# Patient Record
Sex: Female | Born: 1970 | Race: Black or African American | Hispanic: No | Marital: Single | Smoking: Current every day smoker
Health system: Southern US, Community
[De-identification: ages and names within clinical notes are randomized; demographics above are authoritative.]

## PROBLEM LIST (undated history)

## (undated) DIAGNOSIS — Z9289 Personal history of other medical treatment: Secondary | ICD-10-CM

## (undated) DIAGNOSIS — E669 Obesity, unspecified: Secondary | ICD-10-CM

## (undated) DIAGNOSIS — K5792 Diverticulitis of intestine, part unspecified, without perforation or abscess without bleeding: Secondary | ICD-10-CM

## (undated) DIAGNOSIS — R519 Headache, unspecified: Secondary | ICD-10-CM

## (undated) DIAGNOSIS — J302 Other seasonal allergic rhinitis: Secondary | ICD-10-CM

## (undated) DIAGNOSIS — T7840XA Allergy, unspecified, initial encounter: Secondary | ICD-10-CM

## (undated) DIAGNOSIS — B999 Unspecified infectious disease: Secondary | ICD-10-CM

## (undated) DIAGNOSIS — D649 Anemia, unspecified: Secondary | ICD-10-CM

## (undated) DIAGNOSIS — N83209 Unspecified ovarian cyst, unspecified side: Secondary | ICD-10-CM

## (undated) DIAGNOSIS — J189 Pneumonia, unspecified organism: Secondary | ICD-10-CM

## (undated) DIAGNOSIS — I10 Essential (primary) hypertension: Secondary | ICD-10-CM

## (undated) DIAGNOSIS — T148XXA Other injury of unspecified body region, initial encounter: Secondary | ICD-10-CM

## (undated) DIAGNOSIS — R51 Headache: Secondary | ICD-10-CM

## (undated) HISTORY — PX: FOREHEAD RECONSTRUCTION: SHX429

## (undated) HISTORY — PX: TUBAL LIGATION: SHX77

## (undated) HISTORY — DX: Allergy, unspecified, initial encounter: T78.40XA

## (undated) HISTORY — PX: WISDOM TOOTH EXTRACTION: SHX21

## (undated) HISTORY — DX: Other injury of unspecified body region, initial encounter: T14.8XXA

---

## 2002-02-04 ENCOUNTER — Emergency Department (HOSPITAL_COMMUNITY): Admission: EM | Admit: 2002-02-04 | Discharge: 2002-02-04 | Payer: Self-pay | Admitting: *Deleted

## 2002-02-04 ENCOUNTER — Encounter: Payer: Self-pay | Admitting: Emergency Medicine

## 2004-01-04 ENCOUNTER — Inpatient Hospital Stay (HOSPITAL_COMMUNITY): Admission: AD | Admit: 2004-01-04 | Discharge: 2004-01-04 | Payer: Self-pay | Admitting: *Deleted

## 2004-05-02 ENCOUNTER — Emergency Department (HOSPITAL_COMMUNITY): Admission: EM | Admit: 2004-05-02 | Discharge: 2004-05-02 | Payer: Self-pay | Admitting: Emergency Medicine

## 2004-06-09 ENCOUNTER — Emergency Department (HOSPITAL_COMMUNITY): Admission: EM | Admit: 2004-06-09 | Discharge: 2004-06-09 | Payer: Self-pay | Admitting: Emergency Medicine

## 2004-06-22 ENCOUNTER — Emergency Department (HOSPITAL_COMMUNITY): Admission: EM | Admit: 2004-06-22 | Discharge: 2004-06-22 | Payer: Self-pay | Admitting: Emergency Medicine

## 2005-02-16 ENCOUNTER — Emergency Department (HOSPITAL_COMMUNITY): Admission: EM | Admit: 2005-02-16 | Discharge: 2005-02-16 | Payer: Self-pay | Admitting: Emergency Medicine

## 2005-07-17 ENCOUNTER — Emergency Department (HOSPITAL_COMMUNITY): Admission: EM | Admit: 2005-07-17 | Discharge: 2005-07-17 | Payer: Self-pay | Admitting: Emergency Medicine

## 2005-08-22 ENCOUNTER — Emergency Department (HOSPITAL_COMMUNITY): Admission: EM | Admit: 2005-08-22 | Discharge: 2005-08-22 | Payer: Self-pay | Admitting: Emergency Medicine

## 2006-02-23 ENCOUNTER — Inpatient Hospital Stay (HOSPITAL_COMMUNITY): Admission: AD | Admit: 2006-02-23 | Discharge: 2006-02-23 | Payer: Self-pay | Admitting: Gynecology

## 2006-08-18 ENCOUNTER — Emergency Department (HOSPITAL_COMMUNITY): Admission: EM | Admit: 2006-08-18 | Discharge: 2006-08-18 | Payer: Self-pay | Admitting: Emergency Medicine

## 2007-06-13 ENCOUNTER — Emergency Department (HOSPITAL_COMMUNITY): Admission: EM | Admit: 2007-06-13 | Discharge: 2007-06-13 | Payer: Self-pay | Admitting: Emergency Medicine

## 2007-07-17 ENCOUNTER — Emergency Department (HOSPITAL_COMMUNITY): Admission: EM | Admit: 2007-07-17 | Discharge: 2007-07-17 | Payer: Self-pay | Admitting: Emergency Medicine

## 2007-10-26 ENCOUNTER — Emergency Department (HOSPITAL_COMMUNITY): Admission: EM | Admit: 2007-10-26 | Discharge: 2007-10-26 | Payer: Self-pay | Admitting: Emergency Medicine

## 2007-11-19 ENCOUNTER — Emergency Department (HOSPITAL_COMMUNITY): Admission: EM | Admit: 2007-11-19 | Discharge: 2007-11-19 | Payer: Self-pay | Admitting: Emergency Medicine

## 2008-03-07 ENCOUNTER — Emergency Department (HOSPITAL_COMMUNITY): Admission: EM | Admit: 2008-03-07 | Discharge: 2008-03-07 | Payer: Self-pay | Admitting: Emergency Medicine

## 2008-03-30 ENCOUNTER — Emergency Department (HOSPITAL_COMMUNITY): Admission: EM | Admit: 2008-03-30 | Discharge: 2008-03-30 | Payer: Self-pay | Admitting: Emergency Medicine

## 2008-04-16 ENCOUNTER — Emergency Department (HOSPITAL_COMMUNITY): Admission: EM | Admit: 2008-04-16 | Discharge: 2008-04-16 | Payer: Self-pay | Admitting: Emergency Medicine

## 2008-05-02 ENCOUNTER — Emergency Department (HOSPITAL_COMMUNITY): Admission: EM | Admit: 2008-05-02 | Discharge: 2008-05-02 | Payer: Self-pay | Admitting: Emergency Medicine

## 2008-05-17 ENCOUNTER — Emergency Department (HOSPITAL_COMMUNITY): Admission: EM | Admit: 2008-05-17 | Discharge: 2008-05-17 | Payer: Self-pay | Admitting: *Deleted

## 2008-06-09 ENCOUNTER — Emergency Department (HOSPITAL_COMMUNITY): Admission: EM | Admit: 2008-06-09 | Discharge: 2008-06-09 | Payer: Self-pay | Admitting: Emergency Medicine

## 2009-05-01 ENCOUNTER — Emergency Department (HOSPITAL_COMMUNITY): Admission: EM | Admit: 2009-05-01 | Discharge: 2009-05-01 | Payer: Self-pay | Admitting: Emergency Medicine

## 2009-05-25 ENCOUNTER — Emergency Department (HOSPITAL_COMMUNITY): Admission: EM | Admit: 2009-05-25 | Discharge: 2009-05-25 | Payer: Self-pay | Admitting: Emergency Medicine

## 2009-08-03 ENCOUNTER — Emergency Department (HOSPITAL_COMMUNITY): Admission: EM | Admit: 2009-08-03 | Discharge: 2009-08-03 | Payer: Self-pay | Admitting: Emergency Medicine

## 2009-12-19 ENCOUNTER — Emergency Department (HOSPITAL_COMMUNITY): Admission: EM | Admit: 2009-12-19 | Discharge: 2009-12-19 | Payer: Self-pay | Admitting: Emergency Medicine

## 2010-01-19 ENCOUNTER — Emergency Department (HOSPITAL_COMMUNITY): Admission: EM | Admit: 2010-01-19 | Discharge: 2010-01-19 | Payer: Self-pay | Admitting: Emergency Medicine

## 2010-05-25 ENCOUNTER — Emergency Department (HOSPITAL_COMMUNITY): Admission: EM | Admit: 2010-05-25 | Discharge: 2010-05-25 | Payer: Self-pay | Admitting: Emergency Medicine

## 2010-10-21 ENCOUNTER — Emergency Department (HOSPITAL_COMMUNITY): Payer: Self-pay

## 2010-10-21 ENCOUNTER — Emergency Department (HOSPITAL_COMMUNITY)
Admission: EM | Admit: 2010-10-21 | Discharge: 2010-10-21 | Disposition: A | Discharge status: Home / Self Care | Payer: Self-pay | Attending: Emergency Medicine | Admitting: Emergency Medicine

## 2010-10-21 DIAGNOSIS — J45909 Unspecified asthma, uncomplicated: Secondary | ICD-10-CM | POA: Insufficient documentation

## 2010-10-21 DIAGNOSIS — R0602 Shortness of breath: Secondary | ICD-10-CM | POA: Insufficient documentation

## 2010-10-21 DIAGNOSIS — R05 Cough: Secondary | ICD-10-CM | POA: Insufficient documentation

## 2010-10-21 DIAGNOSIS — R059 Cough, unspecified: Secondary | ICD-10-CM | POA: Insufficient documentation

## 2011-02-19 ENCOUNTER — Emergency Department (HOSPITAL_COMMUNITY): Payer: Self-pay

## 2011-02-19 ENCOUNTER — Emergency Department (HOSPITAL_COMMUNITY)
Admission: EM | Admit: 2011-02-19 | Discharge: 2011-02-20 | Disposition: A | Discharge status: Home / Self Care | Payer: Self-pay | Attending: Emergency Medicine | Admitting: Emergency Medicine

## 2011-02-19 DIAGNOSIS — R0602 Shortness of breath: Secondary | ICD-10-CM | POA: Insufficient documentation

## 2011-02-19 DIAGNOSIS — J45909 Unspecified asthma, uncomplicated: Secondary | ICD-10-CM | POA: Insufficient documentation

## 2011-02-19 DIAGNOSIS — R05 Cough: Secondary | ICD-10-CM | POA: Insufficient documentation

## 2011-02-19 DIAGNOSIS — R059 Cough, unspecified: Secondary | ICD-10-CM | POA: Insufficient documentation

## 2011-04-12 LAB — URINALYSIS, ROUTINE W REFLEX MICROSCOPIC
Glucose, UA: NEGATIVE
Hgb urine dipstick: NEGATIVE
Ketones, ur: 15 — AB
Nitrite: NEGATIVE
Protein, ur: NEGATIVE
Specific Gravity, Urine: 1.03
Urobilinogen, UA: 0.2
pH: 5.5

## 2011-04-12 LAB — POCT PREGNANCY, URINE: Preg Test, Ur: NEGATIVE

## 2011-05-07 ENCOUNTER — Emergency Department (HOSPITAL_COMMUNITY)
Admission: EM | Admit: 2011-05-07 | Discharge: 2011-05-07 | Disposition: A | Discharge status: Home / Self Care | Payer: Self-pay | Attending: Emergency Medicine | Admitting: Emergency Medicine

## 2011-05-07 DIAGNOSIS — R0989 Other specified symptoms and signs involving the circulatory and respiratory systems: Secondary | ICD-10-CM | POA: Insufficient documentation

## 2011-05-07 DIAGNOSIS — L2989 Other pruritus: Secondary | ICD-10-CM | POA: Insufficient documentation

## 2011-05-07 DIAGNOSIS — IMO0002 Reserved for concepts with insufficient information to code with codable children: Secondary | ICD-10-CM | POA: Insufficient documentation

## 2011-05-07 DIAGNOSIS — E669 Obesity, unspecified: Secondary | ICD-10-CM | POA: Insufficient documentation

## 2011-05-07 DIAGNOSIS — R0609 Other forms of dyspnea: Secondary | ICD-10-CM | POA: Insufficient documentation

## 2011-05-07 DIAGNOSIS — F411 Generalized anxiety disorder: Secondary | ICD-10-CM | POA: Insufficient documentation

## 2011-05-07 DIAGNOSIS — R21 Rash and other nonspecific skin eruption: Secondary | ICD-10-CM | POA: Insufficient documentation

## 2011-05-07 DIAGNOSIS — L298 Other pruritus: Secondary | ICD-10-CM | POA: Insufficient documentation

## 2011-05-07 DIAGNOSIS — B86 Scabies: Secondary | ICD-10-CM | POA: Insufficient documentation

## 2011-05-25 ENCOUNTER — Emergency Department (HOSPITAL_COMMUNITY)
Admission: EM | Admit: 2011-05-25 | Discharge: 2011-05-26 | Disposition: A | Discharge status: Home / Self Care | Payer: Self-pay | Attending: Emergency Medicine | Admitting: Emergency Medicine

## 2011-05-25 ENCOUNTER — Encounter: Payer: Self-pay | Admitting: Emergency Medicine

## 2011-05-25 DIAGNOSIS — R0789 Other chest pain: Secondary | ICD-10-CM | POA: Insufficient documentation

## 2011-05-25 DIAGNOSIS — J45909 Unspecified asthma, uncomplicated: Secondary | ICD-10-CM | POA: Insufficient documentation

## 2011-05-25 DIAGNOSIS — R0602 Shortness of breath: Secondary | ICD-10-CM | POA: Insufficient documentation

## 2011-05-25 DIAGNOSIS — R059 Cough, unspecified: Secondary | ICD-10-CM | POA: Insufficient documentation

## 2011-05-25 DIAGNOSIS — R05 Cough: Secondary | ICD-10-CM | POA: Insufficient documentation

## 2011-05-25 MED ORDER — ALBUTEROL SULFATE (5 MG/ML) 0.5% IN NEBU
5.0000 mg | INHALATION_SOLUTION | Freq: Once | RESPIRATORY_TRACT | Status: AC
  Administered 2011-05-25: 5 mg via RESPIRATORY_TRACT
  Filled 2011-05-25: qty 1

## 2011-05-25 MED ORDER — ALBUTEROL SULFATE (5 MG/ML) 0.5% IN NEBU
2.5000 mg | INHALATION_SOLUTION | Freq: Once | RESPIRATORY_TRACT | Status: AC
  Administered 2011-05-25: 2.5 mg via RESPIRATORY_TRACT
  Filled 2011-05-25: qty 1

## 2011-05-25 MED ORDER — PREDNISONE 20 MG PO TABS
60.0000 mg | ORAL_TABLET | Freq: Once | ORAL | Status: AC
  Administered 2011-05-25: 60 mg via ORAL
  Filled 2011-05-25: qty 3

## 2011-05-25 MED ORDER — IPRATROPIUM BROMIDE 0.02 % IN SOLN
0.5000 mg | Freq: Once | RESPIRATORY_TRACT | Status: AC
Start: 2011-05-25 — End: 2011-05-25
  Administered 2011-05-25: 0.5 mg via RESPIRATORY_TRACT
  Filled 2011-05-25: qty 2.5

## 2011-05-25 NOTE — ED Notes (Signed)
Dry hacking cough noted with exp wheeze pt reports hx asthma and recent dx pneumonia

## 2011-05-25 NOTE — ED Notes (Signed)
Pain and cough since early this morning

## 2011-05-26 ENCOUNTER — Emergency Department (HOSPITAL_COMMUNITY): Payer: Self-pay

## 2011-05-26 MED ORDER — ALBUTEROL SULFATE HFA 108 (90 BASE) MCG/ACT IN AERS
2.0000 | INHALATION_SPRAY | Freq: Once | RESPIRATORY_TRACT | Status: AC
  Administered 2011-05-26: 2 via RESPIRATORY_TRACT
  Filled 2011-05-26: qty 6.7

## 2011-05-26 MED ORDER — HYDROCOD POLST-CHLORPHEN POLST 10-8 MG/5ML PO LQCR: 5.0000 mL | Freq: Two times a day (BID) | ORAL | Status: DC

## 2011-05-26 NOTE — ED Provider Notes (Signed)
History     CSN: [id number] Arrival date & time: 05/25/2011  6:30 PM   First MD Initiated Contact with Patient 05/25/11 2201      Chief Complaint  Patient presents with  . Shortness of Breath    (Consider location/radiation/quality/duration/timing/severity/associated sxs/prior treatment) Patient is a 40 y.o. female presenting with shortness of breath. The history is provided by the patient.  Shortness of Breath  The current episode started today (She has a history of asthma using an inhaler at home all day with decreasing relief. ). The problem has been gradually worsening. The problem is moderate. Associated symptoms include cough, shortness of breath and wheezing. Pertinent negatives include no fever. Her past medical history is significant for asthma.    Past Medical History  Diagnosis Date  . Asthma     Past Surgical History  Procedure Date  . Tubal ligation   . Cesarean section     No family history on file.  History  Substance Use Topics  . Smoking status: Former Smoker    Types: Cigarettes  . Smokeless tobacco: Not on file  . Alcohol Use: 1.8 oz/week    3 Glasses of wine per week     week    OB History    Grav Para Term Preterm Abortions TAB SAB Ect Mult Living                  Review of Systems  Constitutional: Negative for fever and chills.  HENT: Negative.   Respiratory: Positive for cough, chest tightness, shortness of breath and wheezing.   Cardiovascular: Negative.   Gastrointestinal: Negative.   Musculoskeletal: Negative.   Skin: Negative.   Neurological: Negative.     Allergies  Ibuprofen and Azithromycin  Home Medications   Current Outpatient Rx  Name Route Sig Dispense Refill  . ALBUTEROL SULFATE HFA 108 (90 BASE) MCG/ACT IN AERS Inhalation Inhale 2 puffs into the lungs every 6 (six) hours as needed. For wheezing.       BP 135/84  Pulse 82  Temp(Src) 97.9 F (36.6 C) (Oral)  Resp 17  Ht 5\' 2"  (1.575 m)  Wt 253 lb (114.76 kg)   BMI 46.27 kg/m2  SpO2 97%  LMP 05/09/2011  Physical Exam  Constitutional: She appears well-developed and well-nourished.  HENT:  Head: Normocephalic.  Neck: Normal range of motion. Neck supple.  Cardiovascular: Normal rate and regular rhythm.   Pulmonary/Chest: Effort normal. No respiratory distress. She has wheezes. She exhibits tenderness.  Abdominal: Soft. Bowel sounds are normal. There is no tenderness. There is no rebound and no guarding.  Musculoskeletal: Normal range of motion.  Neurological: She is alert. No cranial nerve deficit.  Skin: Skin is warm and dry. No rash noted.  Psychiatric: She has a normal mood and affect.    ED Course  Procedures (including critical care time)  Labs Reviewed - No data to display Dg Chest 2 View  05/26/2011  *RADIOLOGY REPORT*  Clinical Data: Productive cough, chest pain and shortness of breath; history of asthma.  CHEST - 2 VIEW  Comparison: Chest radiograph performed 02/19/2011  Findings: The lungs are well-aerated.  Chronic peribronchial thickening is noted.  There is no evidence of focal opacification, pleural effusion or pneumothorax.  The heart is normal in size; the mediastinal contour is within normal limits.  No acute osseous abnormalities are seen.  IMPRESSION: Chronic peribronchial thickening noted; lungs otherwise clear.  Original Report Authenticated By: Tonia Ghent, M.D.     No diagnosis  found.    MDM   Results for orders placed during the hospital encounter of 05/02/08  POCT PREGNANCY, URINE      Component Value Range   Preg Test, Ur       Value: NEGATIVE            THE SENSITIVITY OF THIS     METHODOLOGY IS >24 mIU/mL  URINALYSIS, ROUTINE W REFLEX MICROSCOPIC      Component Value Range   Color, Urine YELLOW     Appearance CLEAR     Specific Gravity, Urine 1.030     pH 5.5     Glucose, UA NEGATIVE     Hgb urine dipstick NEGATIVE     Bilirubin Urine SMALL (*)    Ketones, ur 15 (*)    Protein, ur NEGATIVE      Urobilinogen, UA 0.2     Nitrite NEGATIVE     Leukocytes, UA       Value: NEGATIVE MICROSCOPIC NOT DONE ON URINES WITH NEGATIVE PROTEIN, BLOOD, LEUKOCYTES, NITRITE, OR GLUCOSE <1000 mg/dL.           Rodena Medin, PA 05/26/11 0111

## 2011-05-26 NOTE — ED Provider Notes (Signed)
Medical screening examination/treatment/procedure(s) were performed by non-physician practitioner and as supervising physician I was immediately available for consultation/collaboration.  Olivia Mackie, MD 05/26/11 [phone]

## 2011-05-29 ENCOUNTER — Encounter (HOSPITAL_COMMUNITY): Payer: Self-pay

## 2011-05-29 ENCOUNTER — Observation Stay (HOSPITAL_COMMUNITY)
Admission: EM | Admit: 2011-05-29 | Discharge: 2011-05-29 | Disposition: A | Discharge status: Home / Self Care | Payer: Self-pay | Attending: Emergency Medicine | Admitting: Emergency Medicine

## 2011-05-29 DIAGNOSIS — J441 Chronic obstructive pulmonary disease with (acute) exacerbation: Principal | ICD-10-CM | POA: Insufficient documentation

## 2011-05-29 DIAGNOSIS — R0602 Shortness of breath: Secondary | ICD-10-CM | POA: Insufficient documentation

## 2011-05-29 DIAGNOSIS — Z87891 Personal history of nicotine dependence: Secondary | ICD-10-CM | POA: Insufficient documentation

## 2011-05-29 MED ORDER — FLUTICASONE PROPIONATE HFA 44 MCG/ACT IN AERO
2.0000 | INHALATION_SPRAY | Freq: Once | RESPIRATORY_TRACT | Status: AC
  Administered 2011-05-29: 2 via RESPIRATORY_TRACT
  Filled 2011-05-29: qty 10.6

## 2011-05-29 MED ORDER — PREDNISONE 20 MG PO TABS
40.0000 mg | ORAL_TABLET | Freq: Once | ORAL | Status: AC
  Administered 2011-05-29: 40 mg via ORAL
  Filled 2011-05-29: qty 2

## 2011-05-29 MED ORDER — IPRATROPIUM BROMIDE 0.02 % IN SOLN
RESPIRATORY_TRACT | Status: AC
  Filled 2011-05-29: qty 2.5

## 2011-05-29 MED ORDER — ALBUTEROL SULFATE (5 MG/ML) 0.5% IN NEBU
INHALATION_SOLUTION | RESPIRATORY_TRACT | Status: AC
  Filled 2011-05-29: qty 2

## 2011-05-29 MED ORDER — PREDNISONE 20 MG PO TABS: 40.0000 mg | ORAL_TABLET | Freq: Every day | ORAL | Status: DC

## 2011-05-29 MED ORDER — ALBUTEROL SULFATE (5 MG/ML) 0.5% IN NEBU
5.0000 mg | INHALATION_SOLUTION | RESPIRATORY_TRACT | Status: DC
  Administered 2011-05-29 (×2): 5 mg via RESPIRATORY_TRACT
  Filled 2011-05-29 (×2): qty 0.5
  Filled 2011-05-29: qty 1

## 2011-05-29 MED ORDER — HYDROCODONE-HOMATROPINE 5-1.5 MG/5ML PO SYRP: 5.0000 mL | ORAL_SOLUTION | Freq: Four times a day (QID) | ORAL | Status: AC | PRN

## 2011-05-29 MED ORDER — METHYLPREDNISOLONE SODIUM SUCC 125 MG IJ SOLR
INTRAMUSCULAR | Status: AC
  Administered 2011-05-29: 07:00:00
  Filled 2011-05-29: qty 2

## 2011-05-29 NOTE — ED Notes (Signed)
RT notified and at bedside.

## 2011-05-29 NOTE — ED Notes (Signed)
Pt c/o SOB, was seen here a few days ago for same and prescribed meds but did not get filled

## 2011-05-29 NOTE — Progress Notes (Signed)
Instructed patient on peak flow.  Peak flow measured 300.  spo2 100% on 2L [place] HR 102.  RT will continue to do peak flow with patient and monitor.

## 2011-05-29 NOTE — ED Provider Notes (Signed)
9:05 AM  Patient seen and reevaluated. Bilateral wheezing still present, however improved since arrival. Patient is currently on 2 L of nasal cannula. She normally does not use oxygen at home. She stated her concern to me about being unable to afford her prednisone burst prescription. She was discharged from the Emergency Department last Tuesday after being at her baseline but she was unable to fill her prescription.  11:55 AM Patient states she is feeling better. I will have her receive one more nebulizer treatment and re\re listened to her lungs. If they have improved I will discharge the patient with a prednisone burst.  12:44 PM  Patient has been reevaluated and feels better. Her oxygen has been taken off in order to ambulate the patient and recheck her O2 sats has been ordered. As long as she did not become hypoxic the patient will be discharged with a prescription for prednisone burst.  1:51 PM Patient is currently clear bilaterally. She was able to ambulate without her O2 sats dropping. She'll be discharged with a prednisone burst of 5 days 40 mg. In addition the patient will be given a prescription of Hycodan. She was given Tussionex however it was $70 and she cannot afford this cough syrup. The patient is currently hemodynamically stable and has no complaints.  [place], [place] 05/29/11 1358

## 2011-05-29 NOTE — ED Notes (Signed)
Solumedrol, Albuterol and atrovent given by EMS

## 2011-05-29 NOTE — ED Provider Notes (Signed)
Reviewed and agree  Dayton Bailiff, MD 05/29/11 [phone]

## 2011-05-29 NOTE — ED Notes (Signed)
Patient states that she has had trouble breathing since last night.  States she feels like her chest is feeling better but still alittle tight

## 2011-05-29 NOTE — ED Notes (Signed)
Pt received a inhaler of flovent earlier today but no instructions on how to use or how often to use. Lisette P:az PA made aware and is coming to talk with the pt.

## 2011-05-29 NOTE — ED Provider Notes (Signed)
History     CSN: [id number] Arrival date & time: 05/29/2011  6:05 AM   First MD Initiated Contact with Patient 05/29/11 [phone]      Chief Complaint  Patient presents with  . Shortness of Breath    (Consider location/radiation/quality/duration/timing/severity/associated sxs/prior treatment) HPI Comments: Patient with history of asthma. She reports that she has quit smoking. She was seen here on Tuesday for similar symptoms of wheezing, shortness of breath and coughing. She reports she had a chest x-ray done at that time was told that it was okay. She was treated in the emergency department and released home. 22 loss of job she has been living with her mother who does have a nebulizer. The patient was given prescriptions the last visit that she was here a reports that she's not able to fill her prescriptions due to cost. She has used her mother's medication and nebulizer yesterday morning and yesterday evening but without sustained improvement. Her symptoms got worse last night and into this morning and thus had to call EMS to be brought here to the emergency department. Her primary care physician is normally Dr. Della Goo. Patient reports that she used to be on inhaled maintenance steroids however has not been able to afford it nor see her regular physician in a long time. She is unsure she was given a prescription for oral steroids or not. She denies any fever or chills. No significant chest pain, abdominal pain, vomiting or diarrhea.  Patient is a 40 y.o. female presenting with shortness of breath. The history is provided by the patient.  Shortness of Breath  Associated symptoms include cough, shortness of breath and wheezing. Pertinent negatives include no chest pain and no rhinorrhea.    Past Medical History  Diagnosis Date  . Asthma     Past Surgical History  Procedure Date  . Tubal ligation   . Cesarean section     History reviewed. No pertinent family history.  History    Substance Use Topics  . Smoking status: Former Smoker    Types: Cigarettes  . Smokeless tobacco: Not on file  . Alcohol Use: 1.8 oz/week    3 Glasses of wine per week     week    OB History    Grav Para Term Preterm Abortions TAB SAB Ect Mult Living                  Review of Systems  Constitutional: Negative.   HENT: Negative for congestion and rhinorrhea.   Respiratory: Positive for cough, shortness of breath and wheezing.   Cardiovascular: Negative for chest pain.  Gastrointestinal: Negative for nausea, vomiting, abdominal pain and diarrhea.  Musculoskeletal: Negative for back pain.  All other systems reviewed and are negative.    Allergies  Ibuprofen and Azithromycin  Home Medications   Current Outpatient Rx  Name Route Sig Dispense Refill  . ALBUTEROL SULFATE HFA 108 (90 BASE) MCG/ACT IN AERS Inhalation Inhale 2 puffs into the lungs every 6 (six) hours as needed. Shortness of breath or wheezing     . DEXTROMETHORPHAN POLISTIREX 30 MG/5ML PO LQCR Oral Take 60 mg by mouth every 4 (four) hours as needed. cough     . LORATADINE 10 MG PO TABS Oral Take 10 mg by mouth daily. allergies     . NAPHAZOLINE HCL 0.012 % OP SOLN Both Eyes Place 2 drops into both eyes 4 (four) times daily. Dry eyes     . ALBUTEROL SULFATE HFA 108 (90  BASE) MCG/ACT IN AERS Inhalation Inhale 2 puffs into the lungs every 6 (six) hours as needed. For wheezing.    Marland Kitchen HYDROCODONE-HOMATROPINE 5-1.5 MG/5ML PO SYRP Oral Take 5 mLs by mouth every 6 (six) hours as needed for cough. 120 mL 0  . PREDNISONE 20 MG PO TABS Oral Take 2 tablets (40 mg total) by mouth daily. 10 tablet 0    BP 134/75  Pulse 125  Temp(Src) 98.1 F (36.7 C) (Oral)  Resp 20  Ht 5\' 2"  (1.575 m)  Wt 254 lb (115.214 kg)  BMI 46.46 kg/m2  SpO2 98%  LMP 05/09/2011  Physical Exam  Nursing note and vitals reviewed. Constitutional: She appears well-developed and well-nourished.  HENT:  Head: Normocephalic and atraumatic.  Eyes:  Pupils are equal, round, and reactive to light.  Cardiovascular: Normal rate.   Pulmonary/Chest: Accessory muscle usage present. Tachypnea noted. She has wheezes in the right upper field, the right lower field, the left upper field and the left lower field.  Abdominal: There is no tenderness. There is no rebound and no guarding.  Skin: Skin is warm and dry. No rash noted.    ED Course  Procedures (including critical care time)  Labs Reviewed - No data to display No results found.   1. 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 MDM   Pt in mild distress due to repeat asthmatic bronchitis.  No fever here.  Pt is not septic or toxic appearing.  However inability to get her prescription filled and second visit this week, will put in CDU observation protocol for prolonged treatment and continued monitoring.  Case manager could be involved as well.      Pt placed under obs status and handed off to CDU PA for continued treatment and management of acute bronchitis asthma.  Pt received oral steroids, continuing nebs and also ordered flovent to go home with.  No relevant family history.      Gavin Pound. Oletta Lamas, MD 05/29/11 2217

## 2011-06-05 ENCOUNTER — Emergency Department (HOSPITAL_COMMUNITY)
Admission: EM | Admit: 2011-06-05 | Discharge: 2011-06-05 | Disposition: A | Discharge status: Home / Self Care | Payer: Self-pay | Attending: Emergency Medicine | Admitting: Emergency Medicine

## 2011-06-05 ENCOUNTER — Other Ambulatory Visit: Payer: Self-pay

## 2011-06-05 ENCOUNTER — Encounter (HOSPITAL_COMMUNITY): Payer: Self-pay | Admitting: Emergency Medicine

## 2011-06-05 DIAGNOSIS — Z79899 Other long term (current) drug therapy: Secondary | ICD-10-CM | POA: Insufficient documentation

## 2011-06-05 DIAGNOSIS — R42 Dizziness and giddiness: Secondary | ICD-10-CM | POA: Insufficient documentation

## 2011-06-05 DIAGNOSIS — R209 Unspecified disturbances of skin sensation: Secondary | ICD-10-CM | POA: Insufficient documentation

## 2011-06-05 DIAGNOSIS — I951 Orthostatic hypotension: Secondary | ICD-10-CM | POA: Insufficient documentation

## 2011-06-05 DIAGNOSIS — R0789 Other chest pain: Secondary | ICD-10-CM | POA: Insufficient documentation

## 2011-06-05 DIAGNOSIS — R51 Headache: Secondary | ICD-10-CM | POA: Insufficient documentation

## 2011-06-05 DIAGNOSIS — J45909 Unspecified asthma, uncomplicated: Secondary | ICD-10-CM | POA: Insufficient documentation

## 2011-06-05 LAB — POCT I-STAT, CHEM 8
Glucose, Bld: 109 mg/dL — ABNORMAL HIGH (ref 70–99)
HCT: 48 % — ABNORMAL HIGH (ref 36.0–46.0)
Hemoglobin: 16.3 g/dL — ABNORMAL HIGH (ref 12.0–15.0)
Potassium: 3.1 mEq/L — ABNORMAL LOW (ref 3.5–5.1)

## 2011-06-05 LAB — CARDIAC PANEL(CRET KIN+CKTOT+MB+TROPI): Troponin I: <0.3 ng/mL (ref <0.30)

## 2011-06-05 LAB — PREGNANCY, URINE: Preg Test, Ur: NEGATIVE

## 2011-06-05 MED ORDER — SODIUM CHLORIDE 0.9 % IV BOLUS (SEPSIS)
1000.0000 mL | Freq: Once | INTRAVENOUS | Status: AC
  Administered 2011-06-05: 1000 mL via INTRAVENOUS

## 2011-06-05 MED ORDER — POTASSIUM CHLORIDE 20 MEQ/15ML (10%) PO LIQD
30.0000 meq | Freq: Once | ORAL | Status: AC
  Administered 2011-06-05: 30 meq via ORAL
  Filled 2011-06-05 (×2): qty 30

## 2011-06-05 NOTE — ED Provider Notes (Signed)
History     CSN: [id number] Arrival date & time: 06/05/2011  3:41 PM   First MD Initiated Contact with Patient 06/05/11 1556      Chief Complaint  Patient presents with  . Dizziness    dizziness and headache since 2300 last night including "sweats off and on",  states unsure of fever or not.     HPI  32 old female history of asthma presents with multiple complaints. Patient states that she's experienced to 3 days of lightheadedness. The lightheadedness is intermittent and only present when standing. She states that she feels of diffuse headache. She denies any changes in her vision, changes in her hearing, pain in her ears. She denies any URI symptoms. She states that she feels mild chest tightness chest tightness someone is kicking her intermittently for the past 2 days as well. She denies any radiation of the pain from the sternal area. She denies any shortness of breath. She denies back pain. She does complain of intermittent tingling in her left hand in her bilateral calf area. Denies h/o VTE in self or family. No recent hosp/surg/immob. No h/o cancer. Denies exogenous hormone use, no leg pain or swelling. Denies history of similar.    Past Medical History  Diagnosis Date  . Asthma     Past Surgical History  Procedure Date  . Tubal ligation   . Cesarean section     History reviewed. No pertinent family history.  History  Substance Use Topics  . Smoking status: Former Smoker    Types: Cigarettes  . Smokeless tobacco: Not on file  . Alcohol Use: 1.8 oz/week    3 Glasses of wine per week     week    OB History    Grav Para Term Preterm Abortions TAB SAB Ect Mult Living                  Review of Systems  All other systems reviewed and are negative.   except as noted HPI  Allergies  Ibuprofen and Azithromycin  Home Medications   Current Outpatient Rx  Name Route Sig Dispense Refill  . DEXTROMETHORPHAN POLISTIREX 30 MG/5ML PO LQCR Oral Take 60 mg by mouth  every 4 (four) hours as needed. cough     . HYDROCODONE-HOMATROPINE 5-1.5 MG/5ML PO SYRP Oral Take 5 mLs by mouth every 6 (six) hours as needed for cough. 120 mL 0  . LORATADINE 10 MG PO TABS Oral Take 10 mg by mouth daily. allergies     . NAPHAZOLINE HCL 0.012 % OP SOLN Both Eyes Place 2 drops into both eyes 4 (four) times daily. Dry eyes     . PREDNISONE 20 MG PO TABS Oral Take 40 mg by mouth daily.      . ALBUTEROL SULFATE HFA 108 (90 BASE) MCG/ACT IN AERS Inhalation Inhale 2 puffs into the lungs every 6 (six) hours as needed. Shortness of breath or wheezing       BP 120/84  Pulse 88  Temp(Src) 97.8 F (36.6 C) (Oral)  Resp 19  SpO2 97%  LMP 05/09/2011  Physical Exam  Nursing note and vitals reviewed. Constitutional: She is oriented to person, place, and time. She appears well-developed.  HENT:  Head: Atraumatic.  Mouth/Throat: Oropharynx is clear and moist.  Eyes: Conjunctivae and EOM are normal. Pupils are equal, round, and reactive to light.  Neck: Normal range of motion. Neck supple.  Cardiovascular: Normal rate, regular rhythm, normal heart sounds and intact distal pulses.  Pulmonary/Chest: Effort normal and breath sounds normal. No respiratory distress. She has no wheezes. She has no rales.  Abdominal: Soft. She exhibits no distension. There is no tenderness. There is no rebound and no guarding.  Musculoskeletal: Normal range of motion. She exhibits no edema.  Neurological: She is alert and oriented to person, place, and time. No cranial nerve deficit. Coordination normal.       Strength 5/5 all extremities No pronator drift No facial droop   Skin: Skin is warm and dry. No rash noted.  Psychiatric: She has a normal mood and affect.     Date: 06/05/2011  Rate: 85  Rhythm: normal sinus rhythm  QRS Axis: normal  Intervals: normal  ST/T Wave abnormalities: nonspecific T wave changes  Conduction Disutrbances:none  Narrative Interpretation:   Old EKG Reviewed: changes  noted   ED Course  Procedures (including critical care time)  Labs Reviewed  POCT I-STAT, CHEM 8 - Abnormal; Notable for the following:    Potassium 3.1 (*)    Glucose, Bld 109 (*)    Hemoglobin 16.3 (*)    HCT 48.0 (*)    All other components within normal limits  PREGNANCY, URINE  D-DIMER, QUANTITATIVE  CARDIAC PANEL(CRET KIN+CKTOT+MB+TROPI)   No results found.   1. Orthostatic hypotension   2. Dizziness      MDM  Multiple complaints including dizziness, CP. PERC 1 with tachycardia. Low risk ACS. WIll check istat, dimer, orthostatics, EKG, Reassess.  Stefano Gaul, MD     Patient orthostatic by HR. BP improved with IVF. Feeling better. Labs reviewed and unremarkable incl dimer.   Ambulated without difficulty. No dizziness. Home with PMD f/u  Forbes Cellar, MD 06/05/11 [phone]

## 2011-06-05 NOTE — ED Notes (Signed)
Ambulated and pt states no dizziness states feel "fine."  Waiting for kcl from pharmacy.

## 2011-06-05 NOTE — ED Notes (Signed)
Pt discharged home, given instructions and states understanding.  Denies further questions or needs at present.  

## 2011-06-25 NOTE — ED Notes (Signed)
Patient transported to CT 

## 2011-07-18 ENCOUNTER — Encounter (HOSPITAL_COMMUNITY): Payer: Self-pay | Admitting: Emergency Medicine

## 2011-07-18 ENCOUNTER — Emergency Department (HOSPITAL_COMMUNITY)
Admission: EM | Admit: 2011-07-18 | Discharge: 2011-07-19 | Disposition: A | Discharge status: Home / Self Care | Payer: Self-pay | Attending: Emergency Medicine | Admitting: Emergency Medicine

## 2011-07-18 DIAGNOSIS — J45901 Unspecified asthma with (acute) exacerbation: Secondary | ICD-10-CM | POA: Insufficient documentation

## 2011-07-18 DIAGNOSIS — R0602 Shortness of breath: Secondary | ICD-10-CM | POA: Insufficient documentation

## 2011-07-18 DIAGNOSIS — L509 Urticaria, unspecified: Secondary | ICD-10-CM | POA: Insufficient documentation

## 2011-07-18 DIAGNOSIS — Z79899 Other long term (current) drug therapy: Secondary | ICD-10-CM | POA: Insufficient documentation

## 2011-07-18 DIAGNOSIS — R0789 Other chest pain: Secondary | ICD-10-CM | POA: Insufficient documentation

## 2011-07-18 DIAGNOSIS — Z7982 Long term (current) use of aspirin: Secondary | ICD-10-CM | POA: Insufficient documentation

## 2011-07-18 HISTORY — DX: Obesity, unspecified: E66.9

## 2011-07-18 HISTORY — DX: Other seasonal allergic rhinitis: J30.2

## 2011-07-18 MED ORDER — FAMOTIDINE 20 MG PO TABS
20.0000 mg | ORAL_TABLET | Freq: Once | ORAL | Status: AC
  Administered 2011-07-18: 20 mg via ORAL
  Filled 2011-07-18: qty 1

## 2011-07-18 MED ORDER — IPRATROPIUM BROMIDE 0.02 % IN SOLN
0.5000 mg | Freq: Once | RESPIRATORY_TRACT | Status: AC
  Administered 2011-07-19: 0.5 mg via RESPIRATORY_TRACT
  Filled 2011-07-18: qty 2.5

## 2011-07-18 MED ORDER — PREDNISONE 20 MG PO TABS
60.0000 mg | ORAL_TABLET | Freq: Once | ORAL | Status: AC
  Administered 2011-07-18: 60 mg via ORAL
  Filled 2011-07-18: qty 3

## 2011-07-18 MED ORDER — ALBUTEROL SULFATE (5 MG/ML) 0.5% IN NEBU
5.0000 mg | INHALATION_SOLUTION | Freq: Once | RESPIRATORY_TRACT | Status: AC
  Administered 2011-07-19: 5 mg via RESPIRATORY_TRACT
  Filled 2011-07-18: qty 1

## 2011-07-18 NOTE — ED Provider Notes (Signed)
History     CSN: [id number]  Arrival date & time 07/18/11  1949   First MD Initiated Contact with Patient 07/18/11 2158      Chief Complaint  Patient presents with  . Rash    (Consider location/radiation/quality/duration/timing/severity/associated sxs/prior treatment) HPI Comments: Ms. Gibas has been having a periodic rash that has moved from area to area for the past 3 days associated with chest tightness, slight shortness of breath.  She is taking Benadryl with minimal relief.  She is extremely concerned about the rash on her nipples as it is painful.  Denies any new laundry products, body products clothing  The history is provided by the patient.    Past Medical History  Diagnosis Date  . Asthma   . Obesity   . Seasonal allergies     Past Surgical History  Procedure Date  . Tubal ligation   . Cesarean section     History reviewed. No pertinent family history.  History  Substance Use Topics  . Smoking status: Former Smoker    Types: Cigarettes  . Smokeless tobacco: Not on file  . Alcohol Use: 1.8 oz/week    3 Glasses of wine per week     week    OB History    Grav Para Term Preterm Abortions TAB SAB Ect Mult Living                  Review of Systems  Constitutional: Negative for fever, chills and diaphoresis.  HENT: Negative for neck stiffness.   Respiratory: Positive for chest tightness and shortness of breath. Negative for cough.   Cardiovascular: Negative for palpitations and leg swelling.  Gastrointestinal: Negative for nausea and vomiting.  Musculoskeletal: Negative for arthralgias.  Neurological: Negative for dizziness and weakness.  Hematological: Negative.   Psychiatric/Behavioral: Negative.     Allergies  Ibuprofen and Azithromycin  Home Medications   Current Outpatient Rx  Name Route Sig Dispense Refill  . ALBUTEROL SULFATE HFA 108 (90 BASE) MCG/ACT IN AERS Inhalation Inhale 2 puffs into the lungs every 6 (six) hours as needed.  Shortness of breath or wheezing     . ASPIRIN 500 MG PO TABS Oral Take 500 mg by mouth as needed. For back pain.     Marland Kitchen LORATADINE 10 MG PO TABS Oral Take 10 mg by mouth daily. allergies     . NAPHAZOLINE HCL 0.012 % OP SOLN Both Eyes Place 2 drops into both eyes 4 (four) times daily. Dry eyes       BP 108/68  Pulse 74  Temp(Src) 98.5 F (36.9 C) (Oral)  Resp 20  SpO2 98%  LMP 06/14/2011  Physical Exam  Constitutional: She is oriented to person, place, and time. She appears well-developed and well-nourished.  HENT:  Head: Normocephalic.  Eyes: Pupils are equal, round, and reactive to light.  Neck: Normal range of motion.  Cardiovascular: Normal rate and regular rhythm.   Pulmonary/Chest: Effort normal and breath sounds normal. No respiratory distress. She has no wheezes. She has no rales. She exhibits no tenderness.  Abdominal: Soft.  Musculoskeletal: Normal range of motion.  Neurological: She is oriented to person, place, and time.  Skin: Rash noted.       Urticaria on back chest upper arms abdomen, legs      ED Course  Procedures (including critical care time)  Labs Reviewed - No data to display No results found.   1. Asthma exacerbation   2. Hives  MDM  Allergic reaction.  Will administer Pepcid, and prednisone and observe patient for period of time        Arman Filter, NP 07/18/11 2221  Arman Filter, NP 07/19/11 0128

## 2011-07-18 NOTE — ED Notes (Signed)
PT. REPORTS GENERALIZED ITCHY RASHES/HIVES FOR 2 DAYS , RESPIRATIONS UNLABORED - AIRWAY INTACT , DENIES NEW MEDICATIONS / NEW FOOD OR DETERGENT.

## 2011-07-19 MED ORDER — ALBUTEROL SULFATE HFA 108 (90 BASE) MCG/ACT IN AERS
2.0000 | INHALATION_SPRAY | RESPIRATORY_TRACT | Status: DC | PRN
  Administered 2011-07-19: 2 via RESPIRATORY_TRACT
  Filled 2011-07-19: qty 6.7

## 2011-07-19 MED ORDER — PREDNISONE 10 MG PO TABS: 20.0000 mg | ORAL_TABLET | Freq: Every day | ORAL | Status: DC

## 2011-07-19 NOTE — ED Notes (Signed)
Pt stated understanding of discharge instruction

## 2011-07-21 NOTE — ED Provider Notes (Signed)
Medical screening examination/treatment/procedure(s) were performed by non-physician practitioner and as supervising physician I was immediately available for consultation/collaboration.    Reanne Nellums L Bev Drennen, MD 07/21/11 0559 

## 2011-07-22 ENCOUNTER — Other Ambulatory Visit: Payer: Self-pay

## 2011-07-22 ENCOUNTER — Encounter (HOSPITAL_COMMUNITY): Payer: Self-pay

## 2011-07-22 ENCOUNTER — Emergency Department (HOSPITAL_COMMUNITY): Payer: Self-pay

## 2011-07-22 ENCOUNTER — Emergency Department (HOSPITAL_COMMUNITY)
Admission: EM | Admit: 2011-07-22 | Discharge: 2011-07-22 | Disposition: A | Discharge status: Home / Self Care | Payer: Self-pay | Attending: Emergency Medicine | Admitting: Emergency Medicine

## 2011-07-22 DIAGNOSIS — R0602 Shortness of breath: Secondary | ICD-10-CM | POA: Insufficient documentation

## 2011-07-22 DIAGNOSIS — R05 Cough: Secondary | ICD-10-CM | POA: Insufficient documentation

## 2011-07-22 DIAGNOSIS — Z7982 Long term (current) use of aspirin: Secondary | ICD-10-CM | POA: Insufficient documentation

## 2011-07-22 DIAGNOSIS — R059 Cough, unspecified: Secondary | ICD-10-CM | POA: Insufficient documentation

## 2011-07-22 DIAGNOSIS — J3489 Other specified disorders of nose and nasal sinuses: Secondary | ICD-10-CM | POA: Insufficient documentation

## 2011-07-22 DIAGNOSIS — R0789 Other chest pain: Secondary | ICD-10-CM | POA: Insufficient documentation

## 2011-07-22 DIAGNOSIS — J45901 Unspecified asthma with (acute) exacerbation: Secondary | ICD-10-CM | POA: Insufficient documentation

## 2011-07-22 MED ORDER — ALBUTEROL SULFATE (5 MG/ML) 0.5% IN NEBU
INHALATION_SOLUTION | RESPIRATORY_TRACT | Status: AC
  Administered 2011-07-22: 19:00:00
  Filled 2011-07-22: qty 1

## 2011-07-22 MED ORDER — PREDNISONE 20 MG PO TABS
60.0000 mg | ORAL_TABLET | Freq: Once | ORAL | Status: AC
  Administered 2011-07-22: 60 mg via ORAL
  Filled 2011-07-22: qty 3

## 2011-07-22 MED ORDER — IPRATROPIUM BROMIDE 0.02 % IN SOLN
RESPIRATORY_TRACT | Status: AC
  Administered 2011-07-22: 19:00:00
  Filled 2011-07-22: qty 2.5

## 2011-07-22 MED ORDER — IPRATROPIUM BROMIDE 0.02 % IN SOLN
0.5000 mg | Freq: Once | RESPIRATORY_TRACT | Status: AC
  Administered 2011-07-22: 0.5 mg via RESPIRATORY_TRACT
  Filled 2011-07-22: qty 2.5

## 2011-07-22 MED ORDER — ALBUTEROL SULFATE (5 MG/ML) 0.5% IN NEBU
5.0000 mg | INHALATION_SOLUTION | Freq: Once | RESPIRATORY_TRACT | Status: AC
  Administered 2011-07-22: 5 mg via RESPIRATORY_TRACT
  Filled 2011-07-22: qty 1

## 2011-07-22 MED ORDER — HYDROCODONE-ACETAMINOPHEN 5-325 MG PO TABS: 1.0000 | ORAL_TABLET | ORAL | Status: AC | PRN

## 2011-07-22 NOTE — ED Provider Notes (Signed)
History     CSN: [id number]  Arrival date & time 07/22/11  1819   First MD Initiated Contact with Patient 07/22/11 2031      Chief Complaint  Patient presents with  . Cough  . Shortness of Breath  . Pleurisy    (Consider location/radiation/quality/duration/timing/severity/associated sxs/prior treatment) HPI History provided by pt.   Pt has had wheezing, SOB, diffuse chest tightness and sharp chest pains since yesterday morning.  Developed cough productive of yellow mucous yesterday evening.  Associated w/ nasal congestion.  Denies fever, sinus congestion, sore throat, LE edema and calf pain.  Pt has h/o asthma and current sx typical w/ exception of sharp pains.  Has used her albuterol inhaler but not giving her sufficient relief.  Per prior chart, pt seen for allergic rxn in ED 4 days ago and was prescribed prednisone 10mg  BID.   Past Medical History  Diagnosis Date  . Asthma   . Obesity   . Seasonal allergies     Past Surgical History  Procedure Date  . Tubal ligation   . Cesarean section     History reviewed. No pertinent family history.  History  Substance Use Topics  . Smoking status: Former Smoker    Types: Cigarettes  . Smokeless tobacco: Not on file  . Alcohol Use: 1.8 oz/week    3 Glasses of wine per week     week    OB History    Grav Para Term Preterm Abortions TAB SAB Ect Mult Living                  Review of Systems  All other systems reviewed and are negative.    Allergies  Ibuprofen and Azithromycin  Home Medications   Current Outpatient Rx  Name Route Sig Dispense Refill  . ALBUTEROL SULFATE HFA 108 (90 BASE) MCG/ACT IN AERS Inhalation Inhale 2 puffs into the lungs every 6 (six) hours as needed. Shortness of breath or wheezing     . ASPIRIN 500 MG PO TABS Oral Take 500 mg by mouth as needed. For back pain.     Marland Kitchen LORATADINE 10 MG PO TABS Oral Take 10 mg by mouth daily. allergies     . NAPHAZOLINE HCL 0.012 % OP SOLN Both Eyes Place 2  drops into both eyes 4 (four) times daily. Dry eyes     . PREDNISONE 10 MG PO TABS Oral Take 2 tablets (20 mg total) by mouth daily. 15 tablet 0    BP 146/91  Pulse 98  Temp(Src) 97.5 F (36.4 C) (Oral)  SpO2 98%  LMP 07/21/2011  Physical Exam  Nursing note and vitals reviewed. Constitutional: She is oriented to person, place, and time. She appears well-developed and well-nourished. No distress.  HENT:  Head: Normocephalic and atraumatic.  Eyes:       Normal appearance  Neck: Normal range of motion.  Cardiovascular: Normal rate and regular rhythm.        HR 92.  Pulmonary/Chest: Effort normal and breath sounds normal. No respiratory distress.       Expiratory wheezing at lung bases and both inspiratory and expiratory wheezing mid and upper lung fields.  No coughing.  Sternum ttp.   Musculoskeletal: She exhibits no edema and no tenderness.       2+ Dorsalis Pedis pulses  Neurological: She is alert and oriented to person, place, and time.  Skin: Skin is warm and dry. No rash noted. She is not diaphoretic.  Psychiatric: She has  a normal mood and affect. Her behavior is normal.    ED Course  Procedures (including critical care time)  Labs Reviewed - No data to display Dg Chest 2 View  07/22/2011  *RADIOLOGY REPORT*  Clinical Data: Chest pain, shortness of breath and weakness.  CHEST - 2 VIEW  Comparison: Chest x-ray 05/26/2011.  Findings: The cardiac silhouette, mediastinal and hilar contours are within normal limits.  Chronic right basilar scarring changes and pleural thickening.  No acute infiltrate, edema or effusion.  IMPRESSION:  1.  Chronic right basilar scarring changes and pleural thickening. 2.  No acute pulmonary findings.  Original Report Authenticated By: P. Loralie Champagne, M.D.     1. Asthma exacerbation       MDM  Pt presents w/ asthma exacerbation, likely secondary to viral URI.  Diffuse wheezing on initial exam.  No fever or respiratory distress.  CXR ordered  in triage and neg for pneumonia.  Pt received a nebulizer treatment and 60mg  prednisone.  Reports that sx have improved.  On re-examination, wheezing improved and moving air better at lung bases.  No coughing.  Pt has a rescue inhaler at home.  D/c'd home w/ prednisone and short course of vicodin for pain in chest.  Return precautions discussed.         Otilio Miu, [place] 07/23/11 [phone]

## 2011-07-22 NOTE — ED Notes (Signed)
Pt states that for the past couple of days she has been having a severe cough with chest wall pain. Pt states that she has been coughing up a lot of mucus. She denies any vomiting. No meds pta.

## 2011-07-23 NOTE — ED Provider Notes (Signed)
Medical screening examination/treatment/procedure(s) were performed by non-physician practitioner and as supervising physician I was immediately available for consultation/collaboration.   Zitlali Primm L Brittan Butterbaugh, MD 07/23/11 1449 

## 2011-08-24 ENCOUNTER — Encounter (HOSPITAL_COMMUNITY): Payer: Self-pay

## 2011-08-24 ENCOUNTER — Emergency Department (HOSPITAL_COMMUNITY)
Admission: EM | Admit: 2011-08-24 | Discharge: 2011-08-25 | Disposition: A | Discharge status: Home / Self Care | Payer: Medicaid Other | Attending: Emergency Medicine | Admitting: Emergency Medicine

## 2011-08-24 DIAGNOSIS — Z87891 Personal history of nicotine dependence: Secondary | ICD-10-CM | POA: Insufficient documentation

## 2011-08-24 DIAGNOSIS — R0682 Tachypnea, not elsewhere classified: Secondary | ICD-10-CM | POA: Insufficient documentation

## 2011-08-24 DIAGNOSIS — J45901 Unspecified asthma with (acute) exacerbation: Secondary | ICD-10-CM

## 2011-08-24 MED ORDER — IPRATROPIUM BROMIDE 0.02 % IN SOLN
RESPIRATORY_TRACT | Status: AC
  Administered 2011-08-25: 01:00:00
  Filled 2011-08-24: qty 2.5

## 2011-08-24 MED ORDER — METHYLPREDNISOLONE SODIUM SUCC 125 MG IJ SOLR
INTRAMUSCULAR | Status: AC
  Administered 2011-08-25: 01:00:00
  Filled 2011-08-24: qty 2

## 2011-08-24 MED ORDER — ALBUTEROL SULFATE (5 MG/ML) 0.5% IN NEBU
INHALATION_SOLUTION | RESPIRATORY_TRACT | Status: AC
  Administered 2011-08-25: 01:00:00
  Filled 2011-08-24: qty 2

## 2011-08-24 NOTE — ED Notes (Signed)
ZOX:[id number]<AV> Expected date:08/24/11<BR> Expected time:11:12 PM<BR> Means of arrival:Ambulance<BR> Comments:<BR> asthma

## 2011-08-24 NOTE — ED Notes (Signed)
Per EMS pt c/o wheezing d/t asthma, out of proair and albuteral; pt given 2neb tx and solumedrol enroute

## 2011-08-25 ENCOUNTER — Emergency Department (HOSPITAL_COMMUNITY): Payer: Medicaid Other

## 2011-08-25 MED ORDER — ALBUTEROL SULFATE HFA 108 (90 BASE) MCG/ACT IN AERS
2.0000 | INHALATION_SPRAY | RESPIRATORY_TRACT | Status: DC | PRN
  Administered 2011-08-25: 2 via RESPIRATORY_TRACT
  Filled 2011-08-25: qty 6.7

## 2011-08-25 MED ORDER — PREDNISONE 20 MG PO TABS: 40.0000 mg | ORAL_TABLET | Freq: Every day | ORAL | Status: AC

## 2011-08-25 MED ORDER — IPRATROPIUM BROMIDE 0.02 % IN SOLN
0.5000 mg | Freq: Once | RESPIRATORY_TRACT | Status: AC
  Administered 2011-08-25: 0.5 mg via RESPIRATORY_TRACT
  Filled 2011-08-25: qty 2.5

## 2011-08-25 MED ORDER — ALBUTEROL SULFATE (5 MG/ML) 0.5% IN NEBU
5.0000 mg | INHALATION_SOLUTION | Freq: Once | RESPIRATORY_TRACT | Status: AC
  Administered 2011-08-25: 5 mg via RESPIRATORY_TRACT
  Filled 2011-08-25 (×2): qty 0.5

## 2011-08-25 MED ORDER — ALBUTEROL SULFATE (5 MG/ML) 0.5% IN NEBU
5.0000 mg | INHALATION_SOLUTION | Freq: Once | RESPIRATORY_TRACT | Status: AC
  Administered 2011-08-25: 5 mg via RESPIRATORY_TRACT
  Filled 2011-08-25: qty 1

## 2011-08-25 MED ORDER — PREDNISONE 20 MG PO TABS
60.0000 mg | ORAL_TABLET | Freq: Once | ORAL | Status: AC
  Administered 2011-08-25: 60 mg via ORAL
  Filled 2011-08-25: qty 3

## 2011-08-25 MED ORDER — HYDROCODONE-ACETAMINOPHEN 5-325 MG PO TABS
1.0000 | ORAL_TABLET | Freq: Once | ORAL | Status: AC
  Administered 2011-08-25: 1 via ORAL
  Filled 2011-08-25: qty 1

## 2011-08-25 NOTE — ED Provider Notes (Signed)
Medical screening examination/treatment/procedure(s) were performed by non-physician practitioner and as supervising physician I was immediately available for consultation/collaboration.   Angy Swearengin, MD 08/25/11 0800 

## 2011-08-25 NOTE — ED Provider Notes (Signed)
History     CSN: [id number]  Arrival date & time 08/24/11  2335   First MD Initiated Contact with Patient 08/25/11 0117      Chief Complaint  Patient presents with  . Asthma     Patient is a 41 y.o. female presenting with shortness of breath.  Shortness of Breath  The current episode started today. The problem is moderate. The symptoms are relieved by beta-agonist inhalers. The symptoms are aggravated by activity. Associated symptoms include rhinorrhea, cough, shortness of breath and wheezing. Pertinent negatives include no fever.  Pt reports onset of increasing SOB and wheezing yesterday s/p URI type symptoms this w/e. States SOB somewhat relieved at first by inhaler but she then ran out of her inhaler and symptoms gradually worsened this evening. Pt admits she smokes 3 to 4 cigarettes a day as she has recently tried to cut back. Approx 16 yrs of near 1 pack a day hx.   Past Medical History  Diagnosis Date  . Asthma   . Obesity   . Seasonal allergies     Past Surgical History  Procedure Date  . Tubal ligation   . Cesarean section     No family history on file.  History  Substance Use Topics  . Smoking status: Former Smoker    Types: Cigarettes  . Smokeless tobacco: Not on file  . Alcohol Use: 1.8 oz/week    3 Glasses of wine per week     week    OB History    Grav Para Term Preterm Abortions TAB SAB Ect Mult Living                  Review of Systems  Constitutional: Negative.  Negative for fever.  HENT: Positive for congestion and rhinorrhea.   Eyes: Negative.   Respiratory: Positive for cough, shortness of breath and wheezing.   Cardiovascular: Negative.   Gastrointestinal: Negative.   Genitourinary: Negative.   Musculoskeletal: Negative.   Skin: Negative.   Neurological: Negative.   Hematological: Negative.   Psychiatric/Behavioral: Negative.     Allergies  Ibuprofen and Azithromycin  Home Medications   Current Outpatient Rx  Name Route Sig  Dispense Refill  . ALBUTEROL SULFATE HFA 108 (90 BASE) MCG/ACT IN AERS Inhalation Inhale 2 puffs into the lungs every 6 (six) hours as needed. Shortness of breath or wheezing     . ASPIRIN 500 MG PO TABS Oral Take 500 mg by mouth as needed. For back pain.     Marland Kitchen LORATADINE 10 MG PO TABS Oral Take 10 mg by mouth daily. allergies     . NAPHAZOLINE HCL 0.012 % OP SOLN Both Eyes Place 2 drops into both eyes 4 (four) times daily. Dry eyes       BP 147/89  Pulse 90  Resp 20  SpO2 100%  LMP 08/12/2011  Physical Exam  Constitutional: She is oriented to person, place, and time. She appears well-developed and well-nourished.  HENT:  Head: Normocephalic and atraumatic.  Eyes: Conjunctivae are normal.  Neck: Neck supple.  Cardiovascular: Normal rate and regular rhythm.   Pulmonary/Chest: Tachypnea noted.       Diminished BBS after 1 st neb w/ persistent insp/exp wheezes.  Abdominal: Soft. Bowel sounds are normal.  Musculoskeletal: Normal range of motion.  Neurological: She is alert and oriented to person, place, and time.  Skin: Skin is warm and dry. No erythema.  Psychiatric: She has a normal mood and affect.    ED Course  Procedures Pt continue to have inspiratory and expiratory wheezes w/ diminished BBS after 1st neb. Will start Prednisone and repeat neb.  Pt somewhat improved after 2nd neb. Air movement has improved bil, but continues to have occasional inspiratory wheezes and expiratory wheezes. Pt admits to some improvement in subjective SOB.  0515: Pt 's BBS CTA. Pt reports much improved after 3rd neb. RR now 22 and unlabored. Discussed d/c plan and importance for her to stop smoking and to get established w/ a PCP. Will plan for d/c home on Prednisone x 1 week, give albuterol inhaler to go and provide PCP referrals. Pt agreeable w/ plan.  Labs Reviewed - No data to display Dg Chest 2 View  08/25/2011  *RADIOLOGY REPORT*  Clinical Data: Cough, congestion, shortness of breath.  Smoker.   CHEST - 2 VIEW  Comparison: 07/22/2011  Findings: Scarring and pleural thickening in the right base are stable since the previous study.  Normal heart size and pulmonary vascularity.  No focal airspace consolidation in the lungs.  No blunting of costophrenic angles.  No pneumothorax.  Mild hyperinflation suggesting emphysema.  IMPRESSION: Chronic right pleural thickening and scarring.  Emphysematous changes.  No evidence of active pulmonary disease.  Stable appearance since previous study.  Original Report Authenticated By: Marlon Pel, M.D.     No diagnosis found.    MDM  HPI/PE and clinical findings c/w 1 Asthma flare (Likely associated w/ recent URI) 2. Chronic emphysematous changes associated w/ smoking        Leanne Chang, NP 08/25/11 [phone]

## 2011-08-25 NOTE — Discharge Instructions (Signed)
Please read over the instructions below. Use the inhaler (2 inhalations every 4-6 hours as needed for cough, shortness of breath or wheezing). If you are having to use more than 6 times a day return to the emergency department.  Take Prednisone as directed and be sure to complete. Please read over the information on smoking cessation as your chest xray already shows early changes related to your smoking.  Return for worsening symptoms, otherwise refer to the "[facility]" number above, the resource list below and the attached referral for primary care to assist you in getting established with a primary care doctor for ongoing management of your asthma and other primary healthcare needs.   Asthma, Adult Asthma is a disease of the lungs and can make it hard to breathe. Asthma cannot be cured, but medicine can help control it. Asthma may be started (triggered) by:  Pollen.   Dust.   Animal skin flakes (dander).   Molds.   Foods.   Respiratory infections (colds, flu).   Smoke.   Exercise.   Stress.   Other things that cause allergic reactions or allergies (allergens).  HOME CARE   Talk to your doctor about how to manage your attacks at home. This may include:   Using a tool called a peak flow meter.   Having medicine ready to stop the attack.   Take all medicine as told by your doctor.   Wash bed sheets and blankets every week in hot water and put them in the dryer.   Drink enough fluids to keep your pee (urine) clear or pale yellow.   Always be ready to get emergency help. Write down the phone number for your doctor. Keep it where you can easily find it.   Talk about exercise routines with your doctor.   If animal dander is causing your asthma, you may need to find a new home for your pet(s).  GET HELP RIGHT AWAY IF:   You have muscle aches.   You cough more.   You have chest pain.   You have thick spit (sputum) that changes to yellow, green, gray, or bloody.    Medicine does not stop your wheezing.   You have problems breathing.   You have a fever.   Your medicine causes:   A rash.   Itching.   Puffiness (swelling).   Breathing problems.  MAKE SURE YOU:   Understand these instructions.   Will watch your condition.   Will get help right away if you are not doing well or get worse.  Document Released: 12/15/2007 Document Revised: 03/10/2011 Document Reviewed: 05/08/2008 [facility] Patient Information 2012 [employer]  sthma, F.L.A.R.E. Most people with asthma do not get sick enough that they need emergency care. If you are getting emergency care, it may mean:  You are not taking your asthma medicine the right way.   Your doctor has not given you any or enough long-term control medicine.   Some of your medicines may need to be changed.   You are around things (triggers) that start your asthma.  F Follow up with your doctor. Make an appointment to be seen.   If you have trouble making an appointment, ask to speak to the nurse.   If you do not have a primary care doctor, call to get one.  At the follow-up appointment:  Bring all of your medicine and this plan with you.   Make a plan with your doctor that you can follow every day. This  will keep your asthma under control.   Write down your questions and your doctor's answers.  L Learn about your asthma medicines. Take your medicine as told by the doctor. Take your medicine even if you start to feel better. Quick-relief (rescue).  Kind of medicine:   Name of medicine:   How much:   How often and how long you need to take it:  Long-term control.  Kind of medicine:   Name of medicine:   How much:   How often and how long you need to take it:  Steroid pills or syrup.  Kind of medicine:   Name of medicine:   How much:   How often and how long you need to take it:  A Asthma is a lifelong disease. Your breathing should get better after getting  emergency care. You still need to get control of your asthma.  If you use quick-relief medicine more than 2 times a week, then your asthma is not under control. You need to see your doctor or an asthma specialist to make a plan to get control of your asthma.   Take long-term control medicine every day as told by your doctor.   Figure out what things make your asthma worse. Stay away from these things.  R Respond to these warning signs that your asthma is getting worse:  Your chest feels tight.   You are short of breath.   You are making whistling sounds when you breathe (wheezing).   You are coughing.   Your peak flow is getting low.  Keep taking your medicines as told and call your doctor. E Emergency care may be needed if:  You have trouble talking, breathing, or you start to make whistling sounds when you breathe.   You are working hard to breathe. You may see skin sucking in at the rib cage or above the breastbone.   You need to use quick-relief medicine more than every 4 hours.   You see your peak flow dropping.  Take your quick-relief medicine and wait 20 minutes. If you do not feel better, take it again and wait 20 minutes. If you still do not feel better, take it again and call your local emergency services (911 in [place] right away. Document Released: 04/06/2008 Document Revised: 03/11/2011 Document Reviewed: 04/06/2008 [facility] Patient Information 2012 [place], [place].Asthma, F.L.A.R.E. Most people with asthma do not get sick enough that they need emergency care. If you are getting emergency care, it may mean:  You are not taking your asthma medicine the right way.   Your doctor has not given you any or enough long-term control medicine.   Some of your medicines may need to be changed.   You are around things (triggers) that start your asthma.  F Follow up with your doctor. Make an appointment to be seen.   If you have trouble making an appointment, ask to speak  to the nurse.   If you do not have a primary care doctor, call to get one.  At the follow-up appointment:  Bring all of your medicine and this plan with you.   Make a plan with your doctor that you can follow every day. This will keep your asthma under control.   Write down your questions and your doctor's answers.  L Learn about your asthma medicines. Take your medicine as told by the doctor. Take your medicine even if you start to feel better. Quick-relief (rescue).  Kind of medicine:   Name of  medicine:   How much:   How often and how long you need to take it:  Long-term control.  Kind of medicine:   Name of medicine:   How much:   How often and how long you need to take it:  Steroid pills or syrup.  Kind of medicine:   Name of medicine:   How much:   How often and how long you need to take it:  A Asthma is a lifelong disease. Your breathing should get better after getting emergency care. You still need to get control of your asthma.  If you use quick-relief medicine more than 2 times a week, then your asthma is not under control. You need to see your doctor or an asthma specialist to make a plan to get control of your asthma.   Take long-term control medicine every day as told by your doctor.   Figure out what things make your asthma worse. Stay away from these things.  R Respond to these warning signs that your asthma is getting worse:  Your chest feels tight.   You are short of breath.   You are making whistling sounds when you breathe (wheezing).   You are coughing.   Your peak flow is getting low.  Keep taking your medicines as told and call your doctor. E Emergency care may be needed if:  You have trouble talking, breathing, or you start to make whistling sounds when you breathe.   You are working hard to breathe. You may see skin sucking in at the rib cage or above the breastbone.   You need to use quick-relief medicine more than every 4  hours.   You see your peak flow dropping.  Take your quick-relief medicine and wait 20 minutes. If you do not feel better, take it again and wait 20 minutes. If you still do not feel better, take it again and call your local emergency services (911 in [place] right away. Document Released: 04/06/2008 Document Revised: 03/11/2011 Document Reviewed: 04/06/2008 [facility] Patient Information 2012 [place], [place].  RESOURCE GUIDE  Dental Problems  Patients with Medicaid: [facility] [phone]                                           [phone] [address] [employer] Phone:  [phone]                                                  Phone:  [phone]  If unable to pay or uninsured, contact:  [facility] or [facility]. to become qualified for the adult dental clinic.  Chronic Pain Problems Contact [facility]  [phone] Patients need to be referred by their primary care doctor.  Insufficient Money for Medicine Contact [employer] Way:  call "211" or [facility] (930) 005-8529.  No Primary Care Doctor Call [facility]  ([phone] Other agencies that provide inexpensive medical care    [facility]  [phone]    [facility]  [phone]    [facility]  507-873-2518  Women's Clinic  212-781-7281    Planned Parenthood  319-632-0477    [facility]  [phone]  Psychological Services [facility]  8015818168 [facility]  [phone] [facility]   [phone] (emergency services 770-660-2820)  Substance Abuse Resources [facility]  2517768596 [facility] 431-471-4373 [facility] [place] 978-635-9600 Floydene Flock 7543610045 Residential & Outpatient Substance Abuse Program  502-559-5839  Abuse/Neglect [facility] Child Abuse Hotline 647-095-4815 [place] Child Abuse Hotline (214)533-6394 (After  Hours)  Emergency Shelter [facility] 864-632-3040  Maternity Homes Room at the [place] [facility] [place] (541)161-1033 [facility] (707)654-5491  MRSA Hotline #:   724-481-5594    [facility] Resources  [facility] [place]     [employer]                          [facility] Dept. [address] [place]                       [address]      [address]  Blondell Reveal Phone:  [phone]                                   Phone:  [phone]                 Phone:  [phone]  [facility] Phone:  ([phone]  [facility] Child Abuse Hotline 6782774456 [phone] (After Hours)

## 2011-09-13 ENCOUNTER — Emergency Department (HOSPITAL_COMMUNITY)
Admission: EM | Admit: 2011-09-13 | Discharge: 2011-09-13 | Disposition: A | Discharge status: Home / Self Care | Payer: Self-pay | Attending: Emergency Medicine | Admitting: Emergency Medicine

## 2011-09-13 ENCOUNTER — Encounter (HOSPITAL_COMMUNITY): Payer: Self-pay | Admitting: Emergency Medicine

## 2011-09-13 DIAGNOSIS — L299 Pruritus, unspecified: Secondary | ICD-10-CM | POA: Insufficient documentation

## 2011-09-13 DIAGNOSIS — R21 Rash and other nonspecific skin eruption: Secondary | ICD-10-CM | POA: Insufficient documentation

## 2011-09-13 DIAGNOSIS — Z79899 Other long term (current) drug therapy: Secondary | ICD-10-CM | POA: Insufficient documentation

## 2011-09-13 DIAGNOSIS — J45909 Unspecified asthma, uncomplicated: Secondary | ICD-10-CM | POA: Insufficient documentation

## 2011-09-13 MED ORDER — DIPHENHYDRAMINE HCL 25 MG PO CAPS: 25.0000 mg | ORAL_CAPSULE | Freq: Four times a day (QID) | ORAL | Status: DC | PRN

## 2011-09-13 MED ORDER — HYDROXYZINE HCL 25 MG PO TABS: 25.0000 mg | ORAL_TABLET | Freq: Four times a day (QID) | ORAL | Status: AC

## 2011-09-13 MED ORDER — HYDROCORTISONE 1 % EX CREA: TOPICAL_CREAM | CUTANEOUS | Status: DC

## 2011-09-13 NOTE — ED Provider Notes (Signed)
History     CSN: [id number]  Arrival date & time 09/13/11  0026   First MD Initiated Contact with Patient 09/13/11 0049      Chief Complaint  Patient presents with  . Rash     HPI  History provided by the patient. Patient is a 41 year old African American obese female with history of asthma and seasonal allergies who presents with complaints of diffuse pruritic body rash for the past several days to weeks. Symptoms began acutely and have gradually increased. Patient is unsure where symptoms first began. Patient states that she has been too busy to worry about her rash because her mother was ill and recently passed away here in the hospital. [facility] states he had been making several frequent trips to and from the hospital. Patient states that she thinks her rash has been there for at least past 5 days. She has not used any lotions or medications to help treat her symptoms. She does report having associated itching with rash. Patient also states that when she was in the shower she had some burning to the rash but stated that she wasn't sure if this was from her scratching or from the rash. She denies any other symptoms. No fever, chills, sweats, nausea, vomiting. Patient denies having any known skin allergies. She denies any new foods, new clothes soaps new lotions or other symptoms concerning cause.    Past Medical History  Diagnosis Date  . Asthma   . Obesity   . Seasonal allergies     Past Surgical History  Procedure Date  . Tubal ligation   . Cesarean section     History reviewed. No pertinent family history.  History  Substance Use Topics  . Smoking status: Former Smoker    Types: Cigarettes  . Smokeless tobacco: Not on file  . Alcohol Use: 1.8 oz/week    3 Glasses of wine per week     week    OB History    Grav Para Term Preterm Abortions TAB SAB Ect Mult Living                  Review of Systems  Constitutional: Negative for fever, chills and fatigue.    Respiratory: Negative for cough and shortness of breath.   Cardiovascular: Negative for chest pain.  Gastrointestinal: Negative for abdominal pain.  Skin: Positive for rash.  All other systems reviewed and are negative.    Allergies  Ibuprofen and Azithromycin  Home Medications   Current Outpatient Rx  Name Route Sig Dispense Refill  . ALBUTEROL SULFATE HFA 108 (90 BASE) MCG/ACT IN AERS Inhalation Inhale 2 puffs into the lungs every 6 (six) hours as needed. Shortness of breath or wheezing     . ASPIRIN 500 MG PO TABS Oral Take 500 mg by mouth as needed. For back pain.     Marland Kitchen LORATADINE 10 MG PO TABS Oral Take 10 mg by mouth daily. allergies     . TETRAHYDROZOLINE HCL 0.05 % OP SOLN Both Eyes Place 1 drop into both eyes daily as needed. For dry eyes      BP 137/96  Pulse 96  Temp(Src) 98.5 F (36.9 C) (Oral)  Resp 20  SpO2 97%  LMP 09/02/2011  Physical Exam  Nursing note and vitals reviewed. Constitutional: She is oriented to person, place, and time. She appears well-developed and well-nourished. No distress.  HENT:  Head: Normocephalic and atraumatic.  Mouth/Throat: Oropharynx is clear and moist.  Neck: Normal range of  motion. Neck supple.       No meningeal signs  Cardiovascular: Normal rate and regular rhythm.   Pulmonary/Chest: Effort normal and breath sounds normal. No stridor. No respiratory distress. She has no wheezes. She has no rales.  Abdominal: Soft. She exhibits no distension. There is no tenderness. There is no rebound.       Obese  Neurological: She is alert and oriented to person, place, and time.  Skin: Skin is warm and dry. Rash noted.       Diffuse maculopapular rash over body. Highest concentrations over lower abdomen, lower back, bilateral thighs, bilateral shoulders. Some secondary excoriations present. No signs for cellulitis or induration of skin.  Psychiatric: She has a normal mood and affect. Her behavior is normal.    ED Course  Procedures     1. Rash       MDM  12:50 AM patient seen and evaluated. Patient in no acute distress.        Angus Seller, [place] 09/13/11 [phone]

## 2011-09-13 NOTE — ED Provider Notes (Signed)
Medical screening examination/treatment/procedure(s) were performed by non-physician practitioner and as supervising physician I was immediately available for consultation/collaboration. Devoria Albe, MD, Armando Gang   Ward Givens, MD 09/13/11 [phone]

## 2011-09-13 NOTE — ED Notes (Signed)
Patient complaining of a rash all over her body for the past couple of days; patient feels that she has a boil developing on her back.  Patient reports itchiness along with the rash.

## 2011-10-09 ENCOUNTER — Encounter (HOSPITAL_COMMUNITY): Payer: Self-pay | Admitting: Emergency Medicine

## 2011-10-09 ENCOUNTER — Emergency Department (HOSPITAL_COMMUNITY)
Admission: EM | Admit: 2011-10-09 | Discharge: 2011-10-09 | Disposition: A | Discharge status: Home / Self Care | Payer: Self-pay | Attending: Emergency Medicine | Admitting: Emergency Medicine

## 2011-10-09 DIAGNOSIS — J45909 Unspecified asthma, uncomplicated: Secondary | ICD-10-CM | POA: Insufficient documentation

## 2011-10-09 DIAGNOSIS — R21 Rash and other nonspecific skin eruption: Secondary | ICD-10-CM | POA: Insufficient documentation

## 2011-10-09 DIAGNOSIS — Z87891 Personal history of nicotine dependence: Secondary | ICD-10-CM | POA: Insufficient documentation

## 2011-10-09 DIAGNOSIS — Z883 Allergy status to other anti-infective agents status: Secondary | ICD-10-CM | POA: Insufficient documentation

## 2011-10-09 DIAGNOSIS — Z9109 Other allergy status, other than to drugs and biological substances: Secondary | ICD-10-CM | POA: Insufficient documentation

## 2011-10-09 DIAGNOSIS — Z886 Allergy status to analgesic agent status: Secondary | ICD-10-CM | POA: Insufficient documentation

## 2011-10-09 MED ORDER — PERMETHRIN 5 % EX CREA: TOPICAL_CREAM | CUTANEOUS | Status: AC

## 2011-10-09 MED ORDER — CEPHALEXIN 500 MG PO CAPS: 500.0000 mg | ORAL_CAPSULE | Freq: Four times a day (QID) | ORAL | Status: AC

## 2011-10-09 MED ORDER — DIPHENHYDRAMINE HCL 25 MG PO TABS: 25.0000 mg | ORAL_TABLET | Freq: Four times a day (QID) | ORAL | Status: DC

## 2011-10-09 NOTE — ED Notes (Signed)
Pt was seen here on 3/4 for rash covering body.  Pt states rash improved slightly but has become worse over the past 4 days.  Pt states she gets small red bumps that rupture and then scab over.  Pt reports itching and pain with rash.  Pt denies any new medications, creams, etc.

## 2011-10-09 NOTE — ED Provider Notes (Signed)
Medical screening examination/treatment/procedure(s) were performed by non-physician practitioner and as supervising physician I was immediately available for consultation/collaboration.   Celene Kras, MD 10/09/11 [phone]

## 2011-10-09 NOTE — Discharge Instructions (Signed)
Take antibiotic to prevent skin infection from scratching.  Use Permethrin cream as directed to treat for possible scabies.  Take benadryl for itchiness.  Follow up with a dermatologist for further evaluation.  Rash A rash is a change in the color or texture of your skin. There are many different types of rashes. You may have other problems that accompany your rash. CAUSES   Infections.   Allergic reactions. This can include allergies to pets or foods.   Certain medicines.   Exposure to certain chemicals, soaps, or cosmetics.   Heat.   Exposure to poisonous plants.   Tumors, both cancerous and noncancerous.  SYMPTOMS   Redness.   Scaly skin.   Itchy skin.   Dry or cracked skin.   Bumps.   Blisters.   Pain.  DIAGNOSIS  Your caregiver may do a physical exam to determine what type of rash you have. A skin sample (biopsy) may be taken and examined under a microscope. TREATMENT  Treatment depends on the type of rash you have. Your caregiver may prescribe certain medicines. For serious conditions, you may need to see a skin doctor (dermatologist). HOME CARE INSTRUCTIONS   Avoid the substance that caused your rash.   Do not scratch your rash. This can cause infection.   You may take cool baths to help stop itching.   Only take over-the-counter or prescription medicines as directed by your caregiver.   Keep all follow-up appointments as directed by your caregiver.  SEEK IMMEDIATE MEDICAL CARE IF:  You have increasing pain, swelling, or redness.   You have a fever.   You have new or severe symptoms.   You have body aches, diarrhea, or vomiting.   Your rash is not better after 3 days.  MAKE SURE YOU:  Understand these instructions.   Will watch your condition.   Will get help right away if you are not doing well or get worse.  Document Released: 06/18/2002 Document Revised: 06/17/2011 Document Reviewed: 04/12/2011 [facility] Patient Information 2012 [place],  [place].  Eczema Atopic dermatitis, or eczema, is an inherited type of sensitive skin. Often people with eczema have a family history of allergies, asthma, or hay fever. It causes a red itchy rash and dry scaly skin. The itchiness may occur before the skin rash and may be very intense. It is not contagious. Eczema is generally worse during the cooler winter months and often improves with the warmth of summer. Eczema usually starts showing signs in infancy. Some children outgrow eczema, but it may last through adulthood. Flare-ups may be caused by:  Eating something or contact with something you are sensitive or allergic to.   Stress.  DIAGNOSIS  The diagnosis of eczema is usually based upon symptoms and medical history. TREATMENT  Eczema cannot be cured, but symptoms usually can be controlled with treatment or avoidance of allergens (things to which you are sensitive or allergic to).  Controlling the itching and scratching.   Use over-the-counter antihistamines as directed for itching. It is especially useful at night when the itching tends to be worse.   Use over-the-counter steroid creams as directed for itching.   Scratching makes the rash and itching worse and may cause impetigo (a skin infection) if fingernails are contaminated (dirty).   Keeping the skin well moisturized with creams every day. This will seal in moisture and help prevent dryness. Lotions containing alcohol and water can dry the skin and are not recommended.   Limiting exposure to allergens.   Recognizing  situations that cause stress.   Developing a plan to manage stress.  HOME CARE INSTRUCTIONS   Take prescription and over-the-counter medicines as directed by your caregiver.   Do not use anything on the skin without checking with your caregiver.   Keep baths or showers short (5 minutes) in warm (not hot) water. Use mild cleansers for bathing. You may add non-perfumed bath oil to the bath water. It is best to avoid  soap and bubble bath.   Immediately after a bath or shower, when the skin is still damp, apply a moisturizing ointment to the entire body. This ointment should be a petroleum ointment. This will seal in moisture and help prevent dryness. The thicker the ointment the better. These should be unscented.   Keep fingernails cut short and wash hands often. If your child has eczema, it may be necessary to put soft gloves or mittens on your child at night.   Dress in clothes made of cotton or cotton blends. Dress lightly, as heat increases itching.   Avoid foods that may cause flare-ups. Common foods include cow's milk, peanut butter, eggs and wheat.   Keep a child with eczema away from anyone with fever blisters. The virus that causes fever blisters (herpes simplex) can cause a serious skin infection in children with eczema.  SEEK MEDICAL CARE IF:   Itching interferes with sleep.   The rash gets worse or is not better within one week following treatment.   The rash looks infected (pus or soft yellow scabs).   You or your child has an oral temperature above 102 F (38.9 C).   Your baby is older than 3 months with a rectal temperature of 100.5 F (38.1 C) or higher for more than 1 day.   The rash flares up after contact with someone who has fever blisters.  SEEK IMMEDIATE MEDICAL CARE IF:   Your baby is older than 3 months with a rectal temperature of 102 F (38.9 C) or higher.   Your baby is older than 3 months or younger with a rectal temperature of 100.4 F (38 C) or higher.  Document Released: 06/25/2000 Document Revised: 06/17/2011 Document Reviewed: 04/30/2009 [facility] Patient Information 2012 [place], [place].

## 2011-10-09 NOTE — ED Provider Notes (Signed)
History     CSN: [id number]  Arrival date & time 10/09/11  1623   First MD Initiated Contact with Patient 10/09/11 1649      Chief Complaint  Patient presents with  . Rash    (Consider location/radiation/quality/duration/timing/severity/associated sxs/prior treatment) HPI  41 year old female with history of asthma, seasonal allergies, presents with rash. Patient states for the past 3 weeks she has been experiencing rash throughout her body. States t rash was gradual in onset. Described as an itching and burning sensation. Rash seems to worsen with stress, and mildly improved with Benadryl and topical steroid. Patient admits that she lost her mother earlier this year and she has been under a lot of stress. She denies any detergent change in the environment changes, any recent [facility] stay, or any other medication changes.  She denies fever, throat swelling, chest pain, or shortness of breath. She denies nausea, vomiting, diarrhea. She was seen in the ED several weeks ago for complaint and was initially given Benadryl and steroid cream. States only helped temporarily but now the rash is worse. Rash does not affect the palms of hands, or soles of feet. Denies similar rash in other family members.  Past Medical History  Diagnosis Date  . Asthma   . Obesity   . Seasonal allergies     Past Surgical History  Procedure Date  . Tubal ligation   . Cesarean section     No family history on file.  History  Substance Use Topics  . Smoking status: Former Smoker    Types: Cigarettes  . Smokeless tobacco: Not on file  . Alcohol Use: 1.8 oz/week    3 Glasses of wine per week     week    OB History    Grav Para Term Preterm Abortions TAB SAB Ect Mult Living                  Review of Systems  All other systems reviewed and are negative.    Allergies  Ibuprofen and Azithromycin  Home Medications   Current Outpatient Rx  Name Route Sig Dispense Refill  . ALBUTEROL SULFATE HFA  108 (90 BASE) MCG/ACT IN AERS Inhalation Inhale 2 puffs into the lungs every 6 (six) hours as needed. Shortness of breath or wheezing     . ASPIRIN 500 MG PO TABS Oral Take 500 mg by mouth as needed. For back pain.     Marland Kitchen DIPHENHYDRAMINE HCL 25 MG PO CAPS Oral Take 1 capsule (25 mg total) by mouth every 6 (six) hours as needed for itching. 30 capsule 0  . HYDROCORTISONE 1 % EX CREA  Apply to affected area 2 times daily 15 g 0  . LORATADINE 10 MG PO TABS Oral Take 10 mg by mouth daily. allergies     . TETRAHYDROZOLINE HCL 0.05 % OP SOLN Both Eyes Place 1 drop into both eyes daily as needed. For dry eyes      BP 155/98  Pulse 102  Temp(Src) 98 F (36.7 C) (Oral)  Resp 18  SpO2 95%  LMP 09/02/2011  Physical Exam  Nursing note and vitals reviewed. Constitutional: She appears well-developed and well-nourished. No distress.  HENT:  Head: Atraumatic.       No oral mucosal involvement.  Eyes: Conjunctivae are normal.  Neck: Neck supple.  Cardiovascular: Normal rate and regular rhythm.   Pulmonary/Chest: Effort normal and breath sounds normal. She has no wheezes. She exhibits no tenderness.  Abdominal: Soft.  Musculoskeletal: Normal  range of motion.  Neurological: She is alert.  Skin: Skin is warm.       Multiple maculopapular rash noted throughout body in no particular dermatomal pattern. Excoriation noted throughout. Rash also affects the areola of both breasts (chaperone present). Nonpustular, non-petechial. No rash on palms of hands, or soles of feet.      ED Course  Procedures (including critical care time)  Labs Reviewed - No data to display No results found.   No diagnosis found.    MDM  Rash is likely ectopic dermatitis, considering that patient has history of asthma and seasonal allergies. She patient also had been under a lot of stress. However, patient has been taking steroids for asthma and complaining of puffy face. Rash also found in the web space of finger. We'll  give a course treatment for possible scabies. However, patient is recommended to followup with dermatologist for further evaluation. Patient voiced understanding and agrees with plan.        Fayrene Helper, PA-C 10/09/11 1745

## 2011-11-22 ENCOUNTER — Encounter (HOSPITAL_COMMUNITY): Payer: Self-pay | Admitting: Emergency Medicine

## 2011-11-22 ENCOUNTER — Emergency Department (HOSPITAL_COMMUNITY)
Admission: EM | Admit: 2011-11-22 | Discharge: 2011-11-23 | Disposition: A | Discharge status: Home / Self Care | Payer: Medicaid Other | Attending: Emergency Medicine | Admitting: Emergency Medicine

## 2011-11-22 ENCOUNTER — Emergency Department (HOSPITAL_COMMUNITY): Payer: Medicaid Other

## 2011-11-22 DIAGNOSIS — J45901 Unspecified asthma with (acute) exacerbation: Secondary | ICD-10-CM

## 2011-11-22 DIAGNOSIS — Z87891 Personal history of nicotine dependence: Secondary | ICD-10-CM | POA: Insufficient documentation

## 2011-11-22 DIAGNOSIS — J45909 Unspecified asthma, uncomplicated: Secondary | ICD-10-CM | POA: Insufficient documentation

## 2011-11-22 LAB — CBC
HCT: 35.6 % — ABNORMAL LOW (ref 36.0–46.0)
Hemoglobin: 12 g/dL (ref 12.0–15.0)
MCH: 26.8 pg (ref 26.0–34.0)
MCHC: 33.7 g/dL (ref 30.0–36.0)
MCV: 79.6 fL (ref 78.0–100.0)

## 2011-11-22 LAB — BASIC METABOLIC PANEL
BUN: 14 mg/dL (ref 6–23)
Calcium: 9.8 mg/dL (ref 8.4–10.5)
Creatinine, Ser: 0.86 mg/dL (ref 0.50–1.10)
GFR calc non Af Amer: 85 mL/min — ABNORMAL LOW (ref >90)
Glucose, Bld: 95 mg/dL (ref 70–99)

## 2011-11-22 MED ORDER — HYDROCODONE-ACETAMINOPHEN 5-325 MG PO TABS
1.0000 | ORAL_TABLET | Freq: Once | ORAL | Status: AC
  Administered 2011-11-22: 1 via ORAL
  Filled 2011-11-22: qty 1

## 2011-11-22 MED ORDER — ALBUTEROL SULFATE (5 MG/ML) 0.5% IN NEBU
5.0000 mg | INHALATION_SOLUTION | Freq: Once | RESPIRATORY_TRACT | Status: AC
  Administered 2011-11-22: 5 mg via RESPIRATORY_TRACT
  Filled 2011-11-22 (×2): qty 0.5

## 2011-11-22 MED ORDER — DEXAMETHASONE SODIUM PHOSPHATE 10 MG/ML IJ SOLN
10.0000 mg | Freq: Once | INTRAMUSCULAR | Status: AC
  Administered 2011-11-22: 10 mg via INTRAMUSCULAR
  Filled 2011-11-22: qty 1

## 2011-11-22 MED ORDER — IPRATROPIUM BROMIDE 0.02 % IN SOLN
0.5000 mg | Freq: Once | RESPIRATORY_TRACT | Status: AC
  Administered 2011-11-22: 0.5 mg via RESPIRATORY_TRACT
  Filled 2011-11-22: qty 2.5

## 2011-11-22 MED ORDER — ALBUTEROL SULFATE (5 MG/ML) 0.5% IN NEBU
5.0000 mg | INHALATION_SOLUTION | Freq: Once | RESPIRATORY_TRACT | Status: AC
  Administered 2011-11-22: 5 mg via RESPIRATORY_TRACT
  Filled 2011-11-22: qty 1

## 2011-11-22 NOTE — ED Notes (Signed)
Pt reports having chest tightness, with cough and SOB- started yesterday; reports felt like she was going to pass out when got on bus; denies fevers and nausea

## 2011-11-23 MED ORDER — PREDNISONE 50 MG PO TABS: 50.0000 mg | ORAL_TABLET | Freq: Every day | ORAL | Status: AC

## 2011-11-23 MED ORDER — ALBUTEROL SULFATE HFA 108 (90 BASE) MCG/ACT IN AERS
2.0000 | INHALATION_SPRAY | Freq: Four times a day (QID) | RESPIRATORY_TRACT | Status: DC
  Administered 2011-11-23: 2 via RESPIRATORY_TRACT
  Filled 2011-11-23: qty 6.7

## 2011-11-23 NOTE — ED Notes (Signed)
rx x 1, pt voiced understanding to f/u with PCP and return for worsening of sx. 

## 2011-11-23 NOTE — ED Notes (Signed)
Pt requesting to go home, states feels better

## 2011-11-23 NOTE — Discharge Instructions (Signed)
Return here as needed for any worsening in your condition °

## 2011-11-23 NOTE — ED Provider Notes (Signed)
History     CSN: [id number]  Arrival date & time 11/22/11  5784   First MD Initiated Contact with Patient 11/22/11 2229      Chief Complaint  Patient presents with  . Chest Pain    (Consider location/radiation/quality/duration/timing/severity/associated sxs/prior treatment) HPI Patient since emergency department with shortness of breath, and wheezing starting yesterday.  She states, that she has had this problem in the past.  Patient, states that she has a history of asthma with worsening seasonal allergies.  Patient, states that she has not had any fever, chest pain, back pain, headache, nausea/vomiting, abdominal pain, or weakness.  Patient denies sore throat, or nasal congestion.  Patient does have runny nose, especially when she is outside.  Patient, says she has not had any medications to take at home.  Did not try any treatment for her symptoms.  Past Medical History  Diagnosis Date  . Asthma   . Obesity   . Seasonal allergies     Past Surgical History  Procedure Date  . Tubal ligation   . Cesarean section     History reviewed. No pertinent family history.  History  Substance Use Topics  . Smoking status: Former Smoker    Types: Cigarettes    Quit date: 08/12/2011  . Smokeless tobacco: Not on file  . Alcohol Use: 0.0 oz/week     occasion    OB History    Grav Para Term Preterm Abortions TAB SAB Ect Mult Living                  Review of Systems All other systems negative except as documented in the HPI. All pertinent positives and negatives as reviewed in the HPI.  Allergies  Ibuprofen and Azithromycin  Home Medications   Current Outpatient Rx  Name Route Sig Dispense Refill  . ALBUTEROL SULFATE HFA 108 (90 BASE) MCG/ACT IN AERS Inhalation Inhale 2 puffs into the lungs every 6 (six) hours as needed. Shortness of breath or wheezing     . DIPHENHYDRAMINE HCL 25 MG PO CAPS Oral Take 1 capsule (25 mg total) by mouth every 6 (six) hours as needed for  itching. 30 capsule 0  . DIPHENHYDRAMINE HCL 25 MG PO TABS Oral Take 1 tablet (25 mg total) by mouth every 6 (six) hours. 20 tablet 0    BP 148/85  Pulse 84  Temp(Src) 97.8 F (36.6 C) (Oral)  Resp 18  SpO2 100%  LMP 11/15/2011  Physical Exam Physical Examination: General appearance - alert, well appearing, and in no distress, oriented to person, place, and time and overweight Eyes - pupils equal and reactive, extraocular eye movements intact Ears - bilateral TM's and external ear canals normal Nose - normal and patent, no erythema, discharge or polyps Mouth - mucous membranes moist, pharynx normal without lesions Neck - supple, no significant adenopathy Chest - wheezing noted noted bilaterally.  There are no rhonchi, or rubs.  Patient does not have any decreased breath sounds.  Heart - normal rate, regular rhythm, normal S1, S2, no murmurs, rubs, clicks or gallops  ED Course  Procedures (including critical care time)  Labs Reviewed  CBC - Abnormal; Notable for the following:    HCT 35.6 (*)    Platelets 406 (*)    All other components within normal limits  BASIC METABOLIC PANEL - Abnormal; Notable for the following:    GFR calc non Af Amer 85 (*)    All other components within normal limits  POCT  I-STAT TROPONIN I   Dg Chest 2 View  11/22/2011  *RADIOLOGY REPORT*  Clinical Data: Wheezing with shortness of breath for 3 days.  CHEST - 2 VIEW  Comparison: 08/25/2011.  Findings: The heart size and mediastinal contours are stable. There is stable chronic scarring at the right lung base and hyperinflation.  No confluent airspace opacity, pleural effusion or pneumothorax is demonstrated.  IMPRESSION: Stable pulmonary hyperinflation and right basilar scarring.  No acute cardiopulmonary process.  Original Report Authenticated By: Gerrianne Scale, M.D.   issue is given multiple breathing treatments here in the emergency department, along with steroids for her asthma related symptoms  will recheck the patient, following these treatments.   Just rechecked the patient at 12:06 AM and patient is feeling vastly improved and no longer feels short of breath and having no difficulty breathing.  Patient's pulse oximetry has remained stable here in the emergency department  The patient on re-examination has clear breath sounds other than an occ expiratory wheeze. MDM   MDM Reviewed: nursing note, vitals and previous chart Interpretation: x-ray           Carlyle Dolly, PA-C 11/29/11 1811

## 2011-11-29 NOTE — ED Provider Notes (Signed)
Medical screening examination/treatment/procedure(s) were performed by non-physician practitioner and as supervising physician I was immediately available for consultation/collaboration.  Flint Melter, MD 11/29/11 2203

## 2011-12-14 ENCOUNTER — Encounter (HOSPITAL_COMMUNITY): Payer: Self-pay

## 2011-12-14 ENCOUNTER — Emergency Department (HOSPITAL_COMMUNITY)
Admission: EM | Admit: 2011-12-14 | Discharge: 2011-12-14 | Disposition: A | Discharge status: Home / Self Care | Payer: Medicaid Other | Attending: Emergency Medicine | Admitting: Emergency Medicine

## 2011-12-14 DIAGNOSIS — J45901 Unspecified asthma with (acute) exacerbation: Secondary | ICD-10-CM

## 2011-12-14 DIAGNOSIS — Z87891 Personal history of nicotine dependence: Secondary | ICD-10-CM | POA: Insufficient documentation

## 2011-12-14 MED ORDER — PREDNISONE 50 MG PO TABS: 50.0000 mg | ORAL_TABLET | Freq: Every day | ORAL | Status: AC

## 2011-12-14 MED ORDER — ALBUTEROL SULFATE HFA 108 (90 BASE) MCG/ACT IN AERS
2.0000 | INHALATION_SPRAY | RESPIRATORY_TRACT | Status: DC | PRN
  Administered 2011-12-14: 2 via RESPIRATORY_TRACT
  Filled 2011-12-14: qty 6.7

## 2011-12-14 MED ORDER — DEXAMETHASONE SODIUM PHOSPHATE 10 MG/ML IJ SOLN
10.0000 mg | Freq: Once | INTRAMUSCULAR | Status: AC
  Administered 2011-12-14: 10 mg via INTRAMUSCULAR
  Filled 2011-12-14: qty 1

## 2011-12-14 MED ORDER — ALBUTEROL SULFATE (5 MG/ML) 0.5% IN NEBU
5.0000 mg | INHALATION_SOLUTION | Freq: Once | RESPIRATORY_TRACT | Status: AC
  Administered 2011-12-14: 5 mg via RESPIRATORY_TRACT
  Filled 2011-12-14: qty 1

## 2011-12-14 MED ORDER — IPRATROPIUM BROMIDE 0.02 % IN SOLN
0.5000 mg | Freq: Once | RESPIRATORY_TRACT | Status: AC
  Administered 2011-12-14: 0.5 mg via RESPIRATORY_TRACT
  Filled 2011-12-14: qty 2.5

## 2011-12-14 NOTE — ED Notes (Signed)
Pt was at [facility] last month for asthma & put on prednisone.  States she was better but feels chest congestion and awakes at night from coughing so much.  States her chest is tight d/t the coughing.  Ran out of her inhaler.

## 2011-12-14 NOTE — Discharge Instructions (Signed)
Return here as needed. Follow up with the resources below.    RESOURCE GUIDE  Chronic Pain Problems: Contact [facility]  [phone] Patients need to be referred by their primary care doctor.  Insufficient Money for Medicine: Contact United Way:  call "211" or [facility] 613-345-1990.  No Primary Care Doctor: - Call [facility]  [phone] - can help you locate a primary care doctor that  accepts your insurance, provides certain services, etc. - Physician Referral Service- [phone]  Agencies that provide inexpensive medical care: - [facility]  [phone] - [facility]  [phone] - [facility]  [phone] - Women's Clinic  [phone] - Planned Parenthood  [phone] [facility]  [phone]  Medicaid-accepting [facility] Providers: - [facility]- [address]  [phone], Mon-Fri 9am-7pm, Sat 9am-1pm - [facility]- [address] [place], [address] [place]  [phone] - [facility]- [address], [address] [place]  [phone] [facility]- [address]  [phone] - Renaye Rakers- [address] [place], [address], [phone]  Only accepts [place] [facility] [place] patients after they have their name  applied to their card  Self Pay (no insurance) in [place]: - Sickle Cell Patients: Dr Willey Blade, [facility]  [address] [place], [phone] - [facility]- [address] [place]  [phone]       [facility] [place] [facility]- [address]       -     [facility]- see information above (Speak to [employer] if you do not have insurance)       -  [facility]- [address] [place], [phone]       -  [facility]- [address],  [phone]       -  [facility]- [address], [phone]       -  Dr Julio Sicks-  [address],  [address], [place], [phone]       -  [facility]- [address] [phone]       -  [facility]- [address] [phone], also [address], [phone]       -    [facility]- [address] [place], [phone], 1st & 3rd Saturday   every month, 10am-1pm  1) Find a Doctor and Pay Out of Pocket Although you won't have to find out who is covered by your insurance plan, it is a good idea to ask around and get recommendations. You will then need to call the office and see if the doctor you have chosen will accept you as a new patient and what types of options they offer for patients who are self-pay. Some doctors offer discounts or will set up payment plans for their patients who do not have insurance, but you will need to ask so you aren't surprised when you get to your appointment.  2) Contact Your Local Health Department Not all health departments have doctors that can see patients for sick visits, but many do, so it is worth a call to see if yours does. If you don't know where your local health department is, you can check in your phone book. The CDC also has a tool to  help you locate your state's health department, and many state websites also have listings of all of their local health departments.  3) Find a Walk-in Clinic If your illness is not likely to be very severe or complicated, you may want to try a walk in clinic. These are popping up all over the country in pharmacies, drugstores, and shopping centers. They're usually staffed by nurse practitioners or physician assistants that have been trained to treat common illnesses and complaints. They're usually fairly quick and inexpensive. However, if you have serious medical issues or chronic medical problems, these are probably not your best option  STD Testing - [facility] of [facility], STD Clinic, [address] [place], phone [phone] or [phone].   Monday - Friday, call for an appointment. [facility] Department of [employer], STD Clinic, [address], [place], phone [phone] or [phone].  Monday - Friday, call for an appointment.  Abuse/Neglect: [facility] Child Abuse Hotline 807-123-5026 [facility] Child Abuse Hotline 231-681-7922 (After Hours)  Emergency Shelter:  [facility] 564-652-4086  Maternity Homes: - [facility] [place] [facility] [phone] [phone] - [facility] [phone]  MRSA Hotline #:   (680)709-2534  [facility] Resources  [facility]  United Way [facility] Dept. [address]                 [address]         [address] [place] [address]  Blondell Reveal Phone:  [phone]                                  Phone:  [phone]                   Phone:  [phone]  [facility], [phone] - [facility] - [phone]       -     [facility] in [place], [address]                                  [phone], [facility] Child Abuse Hotline (416)303-1123 or 605-060-4567 (After Hours)   Behavioral Health Services  Substance Abuse Resources: - [facility]  (413)045-1617 - [facility] [phone] - [facility] [place] [phone] Floydene Flock 239-134-1750 - Residential & Outpatient Substance Abuse Program  623-781-9300  Psychological Services: [facility]  [phone] Services  [phone] - [facility], [phone] [place]. [address] , ACCESS LINE: 605-443-0811 or 5623182000, [employer]  Dental Assistance  If unable to pay or uninsured, contact:  [facility] or [facility]. to become qualified for the adult dental clinic.  Patients with Medicaid: [facility]  [address], [phone] [address], [phone]  If unable to pay, or uninsured, contact [facility] [phone]) or [facility] Department [phone] in [place], [phone] in [facility]) to become qualified for the adult dental clinic  Other Low-Cost Community Dental Services: - [facility]- [address] [place], [place], [place], [address], [phone], [address]. [address], 2nd and 4th Thursday of the month at 6:30am.  10 clients each day by appointment, can sometimes see walk-in patients if someone does not show for an appointment. [facility]- [address] [place], [place], [address], [phone] - [facility]- [address], [place], [place], [address], [phone] - [facility]- [phone] [facility] Department- [phone] [facility] Department- [phone]

## 2011-12-14 NOTE — ED Provider Notes (Signed)
History     CSN: [id number]  Arrival date & time 12/14/11  1119   First MD Initiated Contact with Patient 12/14/11 1154      Chief Complaint  Patient presents with  . Asthma  . Shortness of Breath  . Chest Pain    (Consider location/radiation/quality/duration/timing/severity/associated sxs/prior treatment) Patient is a 41 y.o. female presenting with asthma and shortness of breath.  Asthma  Shortness of Breath    Patient presents emergency Dept. with asthma exacerbation.  She states it started having some wheezing and chest tightness with cough last night.  She said she ran out of her inhaler.  Patient states that she has not had any fevers, chest pain, weakness, or throat, nasal congestion, nasal drainage, or nausea/vomiting.  Patient, states nothing  she tried at home seem to help some.  She was out of her inhaler. Past Medical History  Diagnosis Date  . Asthma   . Obesity   . Seasonal allergies     Past Surgical History  Procedure Date  . Tubal ligation   . Cesarean section   . Forehead reconstruction     as a child    No family history on file.  History  Substance Use Topics  . Smoking status: Former Smoker    Types: Cigarettes    Quit date: 08/12/2011  . Smokeless tobacco: Not on file  . Alcohol Use: 0.0 oz/week     occasion    OB History    Grav Para Term Preterm Abortions TAB SAB Ect Mult Living                  Review of SystemsAll other systems negative except as documented in the HPI. All pertinent positives and negatives as reviewed in the HPI.   Allergies  Ibuprofen and Azithromycin  Home Medications   Current Outpatient Rx  Name Route Sig Dispense Refill  . ALBUTEROL SULFATE HFA 108 (90 BASE) MCG/ACT IN AERS Inhalation Inhale 2 puffs into the lungs every 6 (six) hours as needed. Shortness of breath or wheezing     . DIPHENHYDRAMINE HCL 25 MG PO CAPS Oral Take 1 capsule (25 mg total) by mouth every 6 (six) hours as needed for itching. 30  capsule 0  . DIPHENHYDRAMINE HCL 25 MG PO TABS Oral Take 1 tablet (25 mg total) by mouth every 6 (six) hours. 20 tablet 0    BP 156/96  Pulse 84  Temp(Src) 98.2 F (36.8 C) (Oral)  Resp 18  Ht 5\' 2"  (1.575 m)  Wt 253 lb (114.76 kg)  BMI 46.27 kg/m2  SpO2 98%  LMP 11/15/2011  Physical Exam  Nursing note and vitals reviewed. Constitutional: She appears well-developed and well-nourished.  HENT:  Mouth/Throat: Oropharynx is clear and moist. No oropharyngeal exudate.  Eyes: Pupils are equal, round, and reactive to light.  Cardiovascular: Normal rate, regular rhythm and normal heart sounds.   Pulmonary/Chest: Effort normal. No respiratory distress. She has wheezes. She has no rales.  Skin: Skin is warm and dry.    ED Course  Procedures (including critical care time)  Patient is vastly improved on reexamination of her lungs by auscultation.  There is no wheezing at this time.  She is moving good air bilaterally.  Will give PCP referrals as well.  Patient feels well to go home home and is advised to  Return here for any worsening in her condition MDM          Carlyle Dolly, PA-C 12/15/11  1552 

## 2011-12-16 NOTE — ED Provider Notes (Signed)
Medical screening examination/treatment/procedure(s) were performed by non-physician practitioner and as supervising physician I was immediately available for consultation/collaboration.   Dayton Bailiff, MD 12/16/11 [phone]

## 2012-01-25 ENCOUNTER — Emergency Department (HOSPITAL_COMMUNITY): Payer: Medicaid Other

## 2012-01-25 ENCOUNTER — Encounter (HOSPITAL_COMMUNITY): Payer: Self-pay | Admitting: *Deleted

## 2012-01-25 ENCOUNTER — Emergency Department (HOSPITAL_COMMUNITY)
Admission: EM | Admit: 2012-01-25 | Discharge: 2012-01-25 | Disposition: A | Discharge status: Home / Self Care | Payer: Medicaid Other | Attending: Emergency Medicine | Admitting: Emergency Medicine

## 2012-01-25 ENCOUNTER — Other Ambulatory Visit: Payer: Self-pay

## 2012-01-25 DIAGNOSIS — J45909 Unspecified asthma, uncomplicated: Secondary | ICD-10-CM | POA: Insufficient documentation

## 2012-01-25 DIAGNOSIS — R0602 Shortness of breath: Secondary | ICD-10-CM | POA: Insufficient documentation

## 2012-01-25 DIAGNOSIS — R05 Cough: Secondary | ICD-10-CM | POA: Insufficient documentation

## 2012-01-25 DIAGNOSIS — R079 Chest pain, unspecified: Secondary | ICD-10-CM | POA: Insufficient documentation

## 2012-01-25 DIAGNOSIS — R059 Cough, unspecified: Secondary | ICD-10-CM | POA: Insufficient documentation

## 2012-01-25 LAB — CBC
HCT: 32.2 % — ABNORMAL LOW (ref 36.0–46.0)
Hemoglobin: 10.7 g/dL — ABNORMAL LOW (ref 12.0–15.0)
MCHC: 33.2 g/dL (ref 30.0–36.0)
MCV: 78.5 fL (ref 78.0–100.0)
RDW: 14.6 % (ref 11.5–15.5)

## 2012-01-25 LAB — BASIC METABOLIC PANEL
BUN: 9 mg/dL (ref 6–23)
Chloride: 104 mEq/L (ref 96–112)
Creatinine, Ser: 0.77 mg/dL (ref 0.50–1.10)
GFR calc Af Amer: >90 mL/min (ref >90)
GFR calc non Af Amer: >90 mL/min (ref >90)
Glucose, Bld: 102 mg/dL — ABNORMAL HIGH (ref 70–99)

## 2012-01-25 MED ORDER — ALBUTEROL SULFATE (5 MG/ML) 0.5% IN NEBU
2.5000 mg | INHALATION_SOLUTION | Freq: Once | RESPIRATORY_TRACT | Status: AC
  Administered 2012-01-25: 2.5 mg via RESPIRATORY_TRACT
  Filled 2012-01-25: qty 0.5

## 2012-01-25 MED ORDER — ALBUTEROL SULFATE HFA 108 (90 BASE) MCG/ACT IN AERS: 1.0000 | INHALATION_SPRAY | Freq: Four times a day (QID) | RESPIRATORY_TRACT | Status: DC | PRN

## 2012-01-25 MED ORDER — OXYCODONE-ACETAMINOPHEN 5-325 MG PO TABS: 2.0000 | ORAL_TABLET | ORAL | Status: AC | PRN

## 2012-01-25 MED ORDER — PREDNISONE 20 MG PO TABS: 60.0000 mg | ORAL_TABLET | Freq: Every day | ORAL | Status: DC

## 2012-01-25 MED ORDER — OXYCODONE-ACETAMINOPHEN 5-325 MG PO TABS
1.0000 | ORAL_TABLET | Freq: Once | ORAL | Status: AC
  Administered 2012-01-25: 1 via ORAL
  Filled 2012-01-25: qty 1

## 2012-01-25 NOTE — ED Notes (Signed)
RT called & will be coming go give breathing treatment.  Pt resting in bed.  NAD noted.

## 2012-01-25 NOTE — ED Notes (Addendum)
Pt reports Hx of asthma, has noted wheezing at home, ran out of inhaler a while back. C/o chest pain that is worse than normal with her asthma, describes as heaviness and tightness. C/o frequent coughing.

## 2012-01-25 NOTE — ED Provider Notes (Addendum)
History     CSN: [id number]  Arrival date & time 01/25/12  1053   First MD Initiated Contact with Patient 01/25/12 1130      Chief Complaint  Patient presents with  . Chest Pain  . Asthma    (Consider location/radiation/quality/duration/timing/severity/associated sxs/prior treatment) HPI  C/o chest tightness, wheezing, shortness of breath x 2-3 day. C/O shortness of breath worsening with lying flat. +Cough - productive with yellow phlegm. Central chest tightness/soreness, back soreness. Feels that it may be from coughing or asthma. Not worse with movement. +Pleuritic. Denies fever/chills. Denies abdominal pain/n/v. Denies h/o VTE in self or family. No recent hosp/surg/immob. No h/o cancer. Denies exogenous hormone use, no leg pain or swelling.  States she came in today for the coughing.  States she ran out of her albuterol inh 3 days ago and sx became worse. Well controlled asthma otherwise. Quit smoking 4-5 months ago   Past Medical History  Diagnosis Date  . Asthma   . Obesity   . Seasonal allergies     Past Surgical History  Procedure Date  . Tubal ligation   . Cesarean section   . Forehead reconstruction     as a child    No family history on file.  History  Substance Use Topics  . Smoking status: Former Smoker    Types: Cigarettes    Quit date: 08/12/2011  . Smokeless tobacco: Not on file  . Alcohol Use: 0.0 oz/week     occasion    OB History    Grav Para Term Preterm Abortions TAB SAB Ect Mult Living                  Review of Systems  All other systems reviewed and are negative.  except as noted HPI   Allergies  Ibuprofen and Azithromycin  Home Medications   Current Outpatient Rx  Name Route Sig Dispense Refill  . ALBUTEROL SULFATE HFA 108 (90 BASE) MCG/ACT IN AERS Inhalation Inhale 2 puffs into the lungs every 6 (six) hours as needed. Shortness of breath or wheezing     . ALBUTEROL SULFATE HFA 108 (90 BASE) MCG/ACT IN AERS Inhalation  Inhale 1-2 puffs into the lungs every 6 (six) hours as needed for wheezing. 1 Inhaler 2  . DIPHENHYDRAMINE HCL 25 MG PO TABS Oral Take 1 tablet (25 mg total) by mouth every 6 (six) hours. 20 tablet 0  . OXYCODONE-ACETAMINOPHEN 5-325 MG PO TABS Oral Take 2 tablets by mouth every 4 (four) hours as needed for pain. 6 tablet 0  . PREDNISONE 20 MG PO TABS Oral Take 3 tablets (60 mg total) by mouth daily. 15 tablet 0    BP 140/76  Pulse 75  Temp 97.9 F (36.6 C) (Oral)  Resp 16  SpO2 100%  LMP 01/04/2012  Physical Exam  Nursing note and vitals reviewed. Constitutional: She is oriented to person, place, and time. She appears well-developed.  HENT:  Head: Atraumatic.  Mouth/Throat: Oropharynx is clear and moist.  Eyes: Conjunctivae and EOM are normal. Pupils are equal, round, and reactive to light.  Neck: Normal range of motion. Neck supple.  Cardiovascular: Normal rate, regular rhythm, normal heart sounds and intact distal pulses.   Pulmonary/Chest: Effort normal. No respiratory distress. She has wheezes. She has no rales. She exhibits tenderness.       Good air movement Min exp wheeze diffusely  +sternal ttp- states that it does reproduce her pain  Abdominal: Soft. She exhibits no distension. There  is no tenderness. There is no rebound and no guarding.  Musculoskeletal: Normal range of motion. She exhibits no edema and no tenderness.  Neurological: She is alert and oriented to person, place, and time.  Skin: Skin is warm and dry. No rash noted.  Psychiatric: She has a normal mood and affect.    Date: 01/26/2012  Rate: 78  Rhythm: normal sinus rhythm  QRS Axis: right  Intervals: normal  ST/T Wave abnormalities: normal  Conduction Disutrbances:none  Narrative Interpretation:   Old EKG Reviewed: no sig change   ED Course  Procedures (including critical care time)  Labs Reviewed  CBC - Abnormal; Notable for the following:    Hemoglobin 10.7 (*)     HCT 32.2 (*)     All other  components within normal limits  BASIC METABOLIC PANEL - Abnormal; Notable for the following:    Glucose, Bld 102 (*)     All other components within normal limits  PRO B NATRIURETIC PEPTIDE  TROPONIN I  LAB REPORT - SCANNED   Dg Chest 2 View  01/25/2012  *RADIOLOGY REPORT*  Clinical Data: Chest pain, shortness of breath.  CHEST - 2 VIEW  Comparison: 11/22/2011  Findings: Mild peribronchial thickening.  Stable scarring at the right lung base.  Blunting of the right costophrenic angle likely reflects pleural thickening/scar.  Left lung is clear.  Heart is normal size.  No acute bony abnormality.  IMPRESSION: Stable right basilar scarring.  Mild bronchitic changes.  Original Report Authenticated By: Cyndie Chime, M.D.   1. Asthma   2. Cough   3. Chest pain     MDM  PW chest pain, cough, wheezing likely related to her asthma. CXR without acute infiltrate. Feeling better after duoneb x one, percocet. VSS (BP improved). Wheezing improved. Given albuterol, prednisone, limited supply of percocet for home (allergy listed to ibuprofen). No EMC precluding discharge at this time. Given Precautions for return. PMD f/u.  BP 140/76  Pulse 75  Temp 97.9 F (36.6 C) (Oral)  Resp 16  SpO2 100%  LMP 01/04/2012          Forbes Cellar, MD 01/26/12 8119  Forbes Cellar, MD 01/26/12 [phone]

## 2012-01-31 ENCOUNTER — Emergency Department (HOSPITAL_COMMUNITY)
Admission: EM | Admit: 2012-01-31 | Discharge: 2012-01-31 | Disposition: A | Discharge status: Home / Self Care | Payer: Medicaid Other | Attending: Emergency Medicine | Admitting: Emergency Medicine

## 2012-01-31 DIAGNOSIS — IMO0002 Reserved for concepts with insufficient information to code with codable children: Secondary | ICD-10-CM | POA: Insufficient documentation

## 2012-01-31 DIAGNOSIS — L02411 Cutaneous abscess of right axilla: Secondary | ICD-10-CM

## 2012-01-31 NOTE — ED Notes (Signed)
Pt verbalizes understanding 

## 2012-01-31 NOTE — ED Provider Notes (Signed)
History     CSN: [id number]  Arrival date & time 01/31/12  1820   First MD Initiated Contact with Patient 01/31/12 2003      Chief Complaint  Patient presents with  . Abscess    (Consider location/radiation/quality/duration/timing/severity/associated sxs/prior treatment) HPI Comments: She presents with abscess to the right axilla. She states that she shaves the area frequently and noticed about this area yesterday. Area has grown in size and is more painful today. She has a history of abscesses requiring drainage in the past. Denies fever or chills. No drainage noted from the area.  Patient is a 41 y.o. female presenting with abscess. The history is provided by the patient.  Abscess  This is a new problem. The current episode started yesterday. The onset was sudden. The problem occurs continuously. The problem has been gradually worsening. Affected Location: R axilla. The problem is moderate. The abscess is characterized by painfulness and redness. Pertinent negatives include no anorexia and no fever. Her past medical history does not include skin abscesses in family. She has received no recent medical care.    Past Medical History  Diagnosis Date  . Asthma   . Obesity   . Seasonal allergies     Past Surgical History  Procedure Date  . Tubal ligation   . Cesarean section   . Forehead reconstruction     as a child    No family history on file.  History  Substance Use Topics  . Smoking status: Former Smoker    Types: Cigarettes    Quit date: 08/12/2011  . Smokeless tobacco: Not on file  . Alcohol Use: 0.0 oz/week     occasion    OB History    Grav Para Term Preterm Abortions TAB SAB Ect Mult Living                  Review of Systems  Constitutional: Negative for fever.  Gastrointestinal: Negative for anorexia.   review of systems as per history of present illness, otherwise negative Allergies  Ibuprofen and Azithromycin  Home Medications   Current  Outpatient Rx  Name Route Sig Dispense Refill  . ALBUTEROL SULFATE HFA 108 (90 BASE) MCG/ACT IN AERS Inhalation Inhale 1-2 puffs into the lungs every 6 (six) hours as needed for wheezing. 1 Inhaler 2  . OXYCODONE-ACETAMINOPHEN 5-325 MG PO TABS Oral Take 2 tablets by mouth every 4 (four) hours as needed for pain. 6 tablet 0  . DIPHENHYDRAMINE HCL 25 MG PO TABS Oral Take 1 tablet (25 mg total) by mouth every 6 (six) hours. 20 tablet 0    BP 150/93  Pulse 77  Temp 98.1 F (36.7 C) (Oral)  Resp 14  SpO2 100%  LMP 01/04/2012  Physical Exam  Nursing note and vitals reviewed. Constitutional: She appears well-developed and well-nourished. No distress.  HENT:  Head: Normocephalic and atraumatic.  Neck: Normal range of motion.  Cardiovascular: Normal rate.   Pulmonary/Chest: Effort normal.  Musculoskeletal: Normal range of motion.  Neurological: She is alert.  Skin: Skin is warm and dry. She is not diaphoretic.       Approximately 2 cm abscess to the right axilla with mild overlying erythema. Area is tender to palpation. There is pointing of the abscess. No drainage noted.  Psychiatric: She has a normal mood and affect.    ED Course  Procedures (including critical care time)  INCISION AND DRAINAGE Performed by: Grant Fontana Consent: Verbal consent obtained. Risks and benefits: risks, benefits  and alternatives were discussed Type: abscess  Body area: r axilla  Anesthesia: local infiltration  Local anesthetic: lidocaine 2% with epinephrine  Anesthetic total: 2 ml  Complexity: complex Blunt dissection to break up loculations  Drainage: purulent  Drainage amount: mild, bloody  Packing material: 1/4 in iodoform gauze  Patient tolerance: Patient tolerated the procedure well with no immediate complications.   Labs Reviewed - No data to display No results found.   1. Abscess of right axilla       MDM  Patient presents with abscess of the right axilla. Area  was incised and drained. Pt tolerated well. Instructed on wound care, reasons to return. Pt verbalized understanding, agreed to plan.       Grant Fontana, PA-C 02/01/12 1624

## 2012-01-31 NOTE — ED Notes (Signed)
Pt reports "shave frequently and noticed a bump under right arm"  Pt notes larger in size and painful.   Pt denies leakage.  Pt denies chills, fever, n/v

## 2012-02-01 NOTE — ED Provider Notes (Signed)
Medical screening examination/treatment/procedure(s) were performed by non-physician practitioner and as supervising physician I was immediately available for consultation/collaboration.   Salayah Meares, MD 02/01/12 1708 

## 2012-02-24 ENCOUNTER — Encounter (HOSPITAL_COMMUNITY): Payer: Self-pay | Admitting: Emergency Medicine

## 2012-02-24 ENCOUNTER — Emergency Department (HOSPITAL_COMMUNITY)
Admission: EM | Admit: 2012-02-24 | Discharge: 2012-02-25 | Disposition: A | Discharge status: Home / Self Care | Payer: Medicaid Other | Attending: Emergency Medicine | Admitting: Emergency Medicine

## 2012-02-24 DIAGNOSIS — Z87891 Personal history of nicotine dependence: Secondary | ICD-10-CM | POA: Insufficient documentation

## 2012-02-24 DIAGNOSIS — J3489 Other specified disorders of nose and nasal sinuses: Secondary | ICD-10-CM | POA: Insufficient documentation

## 2012-02-24 DIAGNOSIS — J45909 Unspecified asthma, uncomplicated: Secondary | ICD-10-CM | POA: Insufficient documentation

## 2012-02-24 DIAGNOSIS — R0981 Nasal congestion: Secondary | ICD-10-CM

## 2012-02-24 NOTE — ED Notes (Signed)
PT. REPORTS NASAL CONGESTION / SINUS PRESSURE WITH HEADACHE FOR SEVERAL DAYS .

## 2012-02-25 MED ORDER — FLUTICASONE PROPIONATE 50 MCG/ACT NA SUSP: 2.0000 | Freq: Every day | NASAL | Status: DC

## 2012-02-25 MED ORDER — GUAIFENESIN ER 600 MG PO TB12: 1200.0000 mg | ORAL_TABLET | Freq: Two times a day (BID) | ORAL | Status: DC

## 2012-02-28 NOTE — ED Provider Notes (Signed)
History     CSN: [id number]  Arrival date & time 02/24/12  2330   First MD Initiated Contact with Patient 02/24/12 2350      Chief Complaint  Patient presents with  . Nasal Congestion    (Consider location/radiation/quality/duration/timing/severity/associated sxs/prior treatment) HPI Comments: Pt with several days of nasal congestion and HA. No f/c.  Patient is a 41 y.o. female presenting with URI. The history is provided by the patient.  URI The primary symptoms include headaches. Primary symptoms do not include fever, fatigue, ear pain, sore throat, swollen glands, cough, wheezing, abdominal pain, nausea, vomiting, myalgias, arthralgias or rash. The current episode started 3 to 5 days ago. This is a new problem. The problem has not changed since onset. Symptoms associated with the illness include rhinorrhea. The illness is not associated with chills, facial pain or sinus pressure. The following treatments were addressed: Acetaminophen was not tried. A decongestant was ineffective. Aspirin was not tried. NSAIDs were not tried.    Past Medical History  Diagnosis Date  . Asthma   . Obesity   . Seasonal allergies     Past Surgical History  Procedure Date  . Tubal ligation   . Cesarean section   . Forehead reconstruction     as a child    No family history on file.  History  Substance Use Topics  . Smoking status: Former Smoker    Types: Cigarettes    Quit date: 08/12/2011  . Smokeless tobacco: Not on file  . Alcohol Use: 0.0 oz/week     occasion    OB History    Grav Para Term Preterm Abortions TAB SAB Ect Mult Living                  Review of Systems  Constitutional: Negative for fever, chills and fatigue.  HENT: Positive for rhinorrhea. Negative for ear pain, sore throat and sinus pressure.   Eyes: Negative for redness and visual disturbance.  Respiratory: Negative for cough and wheezing.   Gastrointestinal: Negative for nausea, vomiting and abdominal  pain.  Musculoskeletal: Negative for myalgias and arthralgias.  Skin: Negative for rash.  Neurological: Positive for headaches.    Allergies  Ibuprofen and Azithromycin  Home Medications   Current Outpatient Rx  Name Route Sig Dispense Refill  . ALBUTEROL SULFATE HFA 108 (90 BASE) MCG/ACT IN AERS Inhalation Inhale 1-2 puffs into the lungs every 6 (six) hours as needed for wheezing. 1 Inhaler 2  . DIPHENHYDRAMINE HCL 25 MG PO TABS Oral Take 1 tablet (25 mg total) by mouth every 6 (six) hours. 20 tablet 0  . FLUTICASONE PROPIONATE 50 MCG/ACT NA SUSP Nasal Place 2 sprays into the nose daily. 16 g 2  . GUAIFENESIN ER 600 MG PO TB12 Oral Take 2 tablets (1,200 mg total) by mouth 2 (two) times daily. 30 tablet 0    BP 158/96  Pulse 88  Temp 98.7 F (37.1 C) (Oral)  Resp 18  SpO2 99%  LMP 02/16/2012  Physical Exam  Nursing note and vitals reviewed. Constitutional: She is oriented to person, place, and time. She appears well-developed and well-nourished. No distress.  HENT:  Head: Normocephalic and atraumatic.  Right Ear: External ear normal.  Left Ear: External ear normal.  Nose: Nose normal.  Mouth/Throat: Oropharynx is clear and moist. No oropharyngeal exudate.       TMs nl bilat Sinuses nontender to palp or percuss Clear rhinorrhea noted to nose Mildly erythematous post oropharynx c/w postnasal drip  Eyes: Conjunctivae and EOM are normal. Pupils are equal, round, and reactive to light.  Neck: Normal range of motion. Neck supple.  Cardiovascular: Normal rate, regular rhythm and normal heart sounds.   Pulmonary/Chest: Effort normal and breath sounds normal. She has no wheezes. She exhibits no tenderness.  Abdominal: Soft. Bowel sounds are normal. There is no tenderness. There is no rebound and no guarding.  Musculoskeletal: Normal range of motion.  Lymphadenopathy:    She has no cervical adenopathy.  Neurological: She is alert and oriented to person, place, and time. No  cranial nerve deficit. Coordination normal.  Skin: Skin is warm and dry. She is not diaphoretic.  Psychiatric: She has a normal mood and affect.    ED Course  Procedures (including critical care time)  Labs Reviewed - No data to display No results found.   1. Nasal congestion       MDM  Pt with several days of nasal congestion. No indication for abx at this time; afebrile, nontender over sinuses. Will rx mucinex and flonase for sx. Reasons to return discussed.       Grant Fontana, PA-C 02/28/12 2129

## 2012-03-21 ENCOUNTER — Encounter (HOSPITAL_COMMUNITY): Payer: Self-pay | Admitting: *Deleted

## 2012-03-21 ENCOUNTER — Emergency Department (HOSPITAL_COMMUNITY)
Admission: EM | Admit: 2012-03-21 | Discharge: 2012-03-21 | Disposition: A | Discharge status: Home / Self Care | Payer: Medicaid Other | Attending: Emergency Medicine | Admitting: Emergency Medicine

## 2012-03-21 ENCOUNTER — Emergency Department (HOSPITAL_COMMUNITY): Payer: Medicaid Other

## 2012-03-21 DIAGNOSIS — J45901 Unspecified asthma with (acute) exacerbation: Secondary | ICD-10-CM

## 2012-03-21 DIAGNOSIS — Z9109 Other allergy status, other than to drugs and biological substances: Secondary | ICD-10-CM | POA: Insufficient documentation

## 2012-03-21 DIAGNOSIS — J45909 Unspecified asthma, uncomplicated: Secondary | ICD-10-CM | POA: Insufficient documentation

## 2012-03-21 DIAGNOSIS — J3489 Other specified disorders of nose and nasal sinuses: Secondary | ICD-10-CM | POA: Insufficient documentation

## 2012-03-21 DIAGNOSIS — Z87891 Personal history of nicotine dependence: Secondary | ICD-10-CM | POA: Insufficient documentation

## 2012-03-21 MED ORDER — ALBUTEROL SULFATE (5 MG/ML) 0.5% IN NEBU
INHALATION_SOLUTION | RESPIRATORY_TRACT | Status: AC
  Filled 2012-03-21: qty 2

## 2012-03-21 MED ORDER — PREDNISONE (PAK) 10 MG PO TABS: 10.0000 mg | ORAL_TABLET | Freq: Every day | ORAL | Status: AC

## 2012-03-21 MED ORDER — ALBUTEROL (5 MG/ML) CONTINUOUS INHALATION SOLN
10.0000 mg/h | INHALATION_SOLUTION | Freq: Once | RESPIRATORY_TRACT | Status: AC
  Administered 2012-03-21: 10 mg/h via RESPIRATORY_TRACT

## 2012-03-21 MED ORDER — HYDROCODONE-ACETAMINOPHEN 5-325 MG PO TABS
1.0000 | ORAL_TABLET | Freq: Once | ORAL | Status: AC
  Administered 2012-03-21: 1 via ORAL
  Filled 2012-03-21: qty 1

## 2012-03-21 MED ORDER — ALBUTEROL SULFATE HFA 108 (90 BASE) MCG/ACT IN AERS: 1.0000 | INHALATION_SPRAY | Freq: Four times a day (QID) | RESPIRATORY_TRACT | Status: DC | PRN

## 2012-03-21 MED ORDER — IPRATROPIUM BROMIDE 0.02 % IN SOLN
0.5000 mg | Freq: Once | RESPIRATORY_TRACT | Status: AC
  Administered 2012-03-21: 0.5 mg via RESPIRATORY_TRACT
  Filled 2012-03-21 (×2): qty 2.5

## 2012-03-21 MED ORDER — BENZONATATE 100 MG PO CAPS: 100.0000 mg | ORAL_CAPSULE | Freq: Three times a day (TID) | ORAL | Status: AC

## 2012-03-21 MED ORDER — PREDNISONE 20 MG PO TABS
60.0000 mg | ORAL_TABLET | Freq: Once | ORAL | Status: AC
  Administered 2012-03-21: 60 mg via ORAL
  Filled 2012-03-21: qty 3

## 2012-03-21 NOTE — ED Notes (Signed)
Pt reports cough sob and nasal congestion/drainage since yesterday. Denies fever.

## 2012-03-21 NOTE — ED Provider Notes (Signed)
History     CSN: [id number]  Arrival date & time 03/21/12  4098   First MD Initiated Contact with Patient 03/21/12 1035      Chief Complaint  Patient presents with  . Cough  . Nasal Congestion    (Consider location/radiation/quality/duration/timing/severity/associated sxs/prior treatment) HPI Comments: Patient with gradual onset of SOB, cough, wheezing x several days.  Associated tightness of her chest, soreness with trying to breathe.  Notes "rattling" in her chest when she lays down.  Cough productive of sputum, unsure of color but she thinks it was clear.  Has been using albuterol inhaler with only partial relief.  Denies fevers.  Denies known sick contacts.  Pt does have seasonal allergies, which she thinks may be causing the current exacerbation.    Patient is a 41 y.o. female presenting with cough. The history is provided by the patient.  Cough Associated symptoms include shortness of breath. Pertinent negatives include no chills.    Past Medical History  Diagnosis Date  . Asthma   . Obesity   . Seasonal allergies     Past Surgical History  Procedure Date  . Tubal ligation   . Cesarean section   . Forehead reconstruction     as a child    History reviewed. No pertinent family history.  History  Substance Use Topics  . Smoking status: Former Smoker    Types: Cigarettes    Quit date: 08/12/2011  . Smokeless tobacco: Not on file  . Alcohol Use: 0.0 oz/week     occasion    OB History    Grav Para Term Preterm Abortions TAB SAB Ect Mult Living                  Review of Systems  Constitutional: Positive for diaphoresis. Negative for fever and chills.  Respiratory: Positive for cough, chest tightness and shortness of breath.   Gastrointestinal: Negative for nausea, vomiting, abdominal pain and diarrhea.    Allergies  Ibuprofen and Azithromycin  Home Medications   Current Outpatient Rx  Name Route Sig Dispense Refill  . ALBUTEROL SULFATE HFA 108 (90  BASE) MCG/ACT IN AERS Inhalation Inhale 1-2 puffs into the lungs every 6 (six) hours as needed for wheezing. 1 Inhaler 2  . DIPHENHYDRAMINE HCL 25 MG PO TABS Oral Take 1 tablet (25 mg total) by mouth every 6 (six) hours. 20 tablet 0    BP 154/110  Pulse 87  Temp 98.2 F (36.8 C) (Oral)  Resp 18  SpO2 97%  LMP 02/08/2012  Physical Exam  Nursing note and vitals reviewed. Constitutional: She appears well-developed and well-nourished. No distress.  HENT:  Head: Normocephalic and atraumatic.  Mouth/Throat: Oropharynx is clear and moist. No oropharyngeal exudate.  Eyes: Conjunctivae are normal.  Neck: Neck supple.  Cardiovascular: Normal rate and regular rhythm.   Pulmonary/Chest: Effort normal. No respiratory distress. She has wheezes. She has no rales.       Pt with diffuse expiratory wheezing  Neurological: She is alert.  Skin: She is not diaphoretic.    ED Course  Procedures (including critical care time)  Labs Reviewed - No data to display Dg Chest 2 View  03/21/2012  *RADIOLOGY REPORT*  Clinical Data: Cough and congestion.  Heaviness in chest.  CHEST - 2 VIEW  Comparison: 01/25/2012  Findings: The heart, mediastinum and hila are unremarkable.  Mild scarring and pleural thickening blunts the cardiophrenic sulcus on the right, stable.  The lungs otherwise clear.  No pleural effusion  or pneumothorax.  Bony thorax is intact.  IMPRESSION: No active disease of the chest and no change from the prior study.   Original Report Authenticated By: Domenic Moras, M.D.    12:30 PM Patient is showing improvement.  States tightness is improved.  Some mild wheezing on exam, but much clearer than prior exam.    1:42 PM Pt continues to report improvement.  Plan is for d/c home.    1. Asthma exacerbation       MDM  Pt with asthma exacerbation and likely viral URI.  CXR negative. Pt has just gotten a PCP, she only has albuterol at home for asthma but comes to the ED regularly for asthma  exacerbation.  Very likely needs to be on other maintenance meds and have nebulizer machine at home - as pt now has PCP, have encouraged close follow up with him . Pt given steroids and hour long neb here with great improvement.  Moving air well in all fields only very slight end expiratory wheezing on exam.  Pt d/c home with albuterol, tessalon perles, prednisone.  Discussed all results with patient.  Pt given return precautions.  Pt verbalizes understanding and agrees with plan.          [place], [place] 03/21/12 [phone]

## 2012-03-21 NOTE — ED Notes (Signed)
Pt here for hx of asthma sts breathing is difficult, and not normal, cough and wheezing noted, chest congestion.

## 2012-03-22 NOTE — ED Provider Notes (Signed)
Medical screening examination/treatment/procedure(s) were performed by non-physician practitioner and as supervising physician I was immediately available for consultation/collaboration.   Danene Montijo, MD 03/22/12 1542 

## 2012-04-04 NOTE — ED Provider Notes (Signed)
Medical screening examination/treatment/procedure(s) were performed by non-physician practitioner and as supervising physician I was immediately available for consultation/collaboration.  Jozi Malachi Lytle Michaels, MD 04/04/12 [phone]

## 2012-04-08 ENCOUNTER — Encounter (HOSPITAL_COMMUNITY): Payer: Self-pay | Admitting: Emergency Medicine

## 2012-04-08 ENCOUNTER — Emergency Department (HOSPITAL_COMMUNITY): Payer: Medicaid Other

## 2012-04-08 ENCOUNTER — Inpatient Hospital Stay (HOSPITAL_COMMUNITY)
Admission: EM | Admit: 2012-04-08 | Discharge: 2012-04-10 | DRG: 392 | Disposition: A | Discharge status: Home / Self Care | Payer: Medicaid Other | Attending: Internal Medicine | Admitting: Internal Medicine

## 2012-04-08 DIAGNOSIS — J4551 Severe persistent asthma with (acute) exacerbation: Secondary | ICD-10-CM | POA: Diagnosis present

## 2012-04-08 DIAGNOSIS — R234 Changes in skin texture: Secondary | ICD-10-CM | POA: Diagnosis present

## 2012-04-08 DIAGNOSIS — D287 Benign neoplasm of other specified female genital organs: Secondary | ICD-10-CM | POA: Diagnosis present

## 2012-04-08 DIAGNOSIS — Z87891 Personal history of nicotine dependence: Secondary | ICD-10-CM

## 2012-04-08 DIAGNOSIS — R1030 Lower abdominal pain, unspecified: Secondary | ICD-10-CM

## 2012-04-08 DIAGNOSIS — K5792 Diverticulitis of intestine, part unspecified, without perforation or abscess without bleeding: Secondary | ICD-10-CM

## 2012-04-08 DIAGNOSIS — K5732 Diverticulitis of large intestine without perforation or abscess without bleeding: Secondary | ICD-10-CM | POA: Diagnosis present

## 2012-04-08 DIAGNOSIS — N83209 Unspecified ovarian cyst, unspecified side: Secondary | ICD-10-CM | POA: Diagnosis present

## 2012-04-08 DIAGNOSIS — J45909 Unspecified asthma, uncomplicated: Secondary | ICD-10-CM

## 2012-04-08 DIAGNOSIS — Z6841 Body Mass Index (BMI) 40.0 and over, adult: Secondary | ICD-10-CM

## 2012-04-08 DIAGNOSIS — E669 Obesity, unspecified: Secondary | ICD-10-CM | POA: Diagnosis present

## 2012-04-08 DIAGNOSIS — N9489 Other specified conditions associated with female genital organs and menstrual cycle: Secondary | ICD-10-CM

## 2012-04-08 DIAGNOSIS — L732 Hidradenitis suppurativa: Secondary | ICD-10-CM | POA: Diagnosis present

## 2012-04-08 DIAGNOSIS — D509 Iron deficiency anemia, unspecified: Secondary | ICD-10-CM | POA: Diagnosis present

## 2012-04-08 LAB — BASIC METABOLIC PANEL
CO2: 23 mEq/L (ref 19–32)
Calcium: 9.7 mg/dL (ref 8.4–10.5)
Chloride: 102 mEq/L (ref 96–112)
Creatinine, Ser: 0.9 mg/dL (ref 0.50–1.10)
GFR calc Af Amer: >90 mL/min (ref >90)
Sodium: 136 mEq/L (ref 135–145)

## 2012-04-08 LAB — URINALYSIS, ROUTINE W REFLEX MICROSCOPIC
Glucose, UA: NEGATIVE mg/dL
Ketones, ur: >80 mg/dL — AB
Leukocytes, UA: NEGATIVE
Nitrite: NEGATIVE
Protein, ur: NEGATIVE mg/dL

## 2012-04-08 LAB — PREGNANCY, URINE: Preg Test, Ur: NEGATIVE

## 2012-04-08 LAB — CBC WITH DIFFERENTIAL/PLATELET
Basophils Absolute: 0 10*3/uL (ref 0.0–0.1)
Basophils Relative: 0 % (ref 0–1)
Lymphocytes Relative: 16 % (ref 12–46)
MCHC: 33.1 g/dL (ref 30.0–36.0)
Neutro Abs: 10.7 10*3/uL — ABNORMAL HIGH (ref 1.7–7.7)
Platelets: 395 10*3/uL (ref 150–400)
RDW: 16.2 % — ABNORMAL HIGH (ref 11.5–15.5)
WBC: 14.6 10*3/uL — ABNORMAL HIGH (ref 4.0–10.5)

## 2012-04-08 LAB — HEPATIC FUNCTION PANEL
ALT: 11 U/L (ref 0–35)
Bilirubin, Direct: 0.1 mg/dL (ref 0.0–0.3)
Indirect Bilirubin: 0.4 mg/dL (ref 0.3–0.9)
Total Protein: 7.6 g/dL (ref 6.0–8.3)

## 2012-04-08 LAB — LIPASE, BLOOD: Lipase: 10 U/L — ABNORMAL LOW (ref 11–59)

## 2012-04-08 MED ORDER — HYDROMORPHONE HCL PF 1 MG/ML IJ SOLN
1.0000 mg | INTRAMUSCULAR | Status: AC
  Administered 2012-04-08: 1 mg via INTRAVENOUS
  Filled 2012-04-08: qty 1

## 2012-04-08 MED ORDER — ONDANSETRON HCL 4 MG/2ML IJ SOLN
4.0000 mg | Freq: Once | INTRAMUSCULAR | Status: AC
  Administered 2012-04-08: 4 mg via INTRAVENOUS
  Filled 2012-04-08: qty 2

## 2012-04-08 MED ORDER — IOHEXOL 300 MG/ML  SOLN
100.0000 mL | Freq: Once | INTRAMUSCULAR | Status: AC | PRN
  Administered 2012-04-08: 100 mL via INTRAVENOUS

## 2012-04-08 NOTE — ED Notes (Signed)
Pt presented to ED with shooting lower abdominal pain radiating to the back from last night.Pt also says that she has SOB as she ran out of her inhalers.

## 2012-04-08 NOTE — ED Notes (Addendum)
Pt c/o shortness of breath x 2 days and generalized abdominal pain onset yesterday. Pt reports diarrhea and nausea. Pt reports out of asthma inhaler.

## 2012-04-09 DIAGNOSIS — R109 Unspecified abdominal pain: Secondary | ICD-10-CM

## 2012-04-09 DIAGNOSIS — N83209 Unspecified ovarian cyst, unspecified side: Secondary | ICD-10-CM

## 2012-04-09 DIAGNOSIS — D509 Iron deficiency anemia, unspecified: Secondary | ICD-10-CM

## 2012-04-09 DIAGNOSIS — J4551 Severe persistent asthma with (acute) exacerbation: Secondary | ICD-10-CM | POA: Diagnosis present

## 2012-04-09 DIAGNOSIS — J45909 Unspecified asthma, uncomplicated: Secondary | ICD-10-CM

## 2012-04-09 DIAGNOSIS — K5732 Diverticulitis of large intestine without perforation or abscess without bleeding: Secondary | ICD-10-CM | POA: Diagnosis present

## 2012-04-09 DIAGNOSIS — N9489 Other specified conditions associated with female genital organs and menstrual cycle: Secondary | ICD-10-CM

## 2012-04-09 DIAGNOSIS — L732 Hidradenitis suppurativa: Secondary | ICD-10-CM | POA: Diagnosis present

## 2012-04-09 LAB — COMPREHENSIVE METABOLIC PANEL
AST: 12 U/L (ref 0–37)
Alkaline Phosphatase: 58 U/L (ref 39–117)
CO2: 23 mEq/L (ref 19–32)
Chloride: 102 mEq/L (ref 96–112)
Creatinine, Ser: 0.82 mg/dL (ref 0.50–1.10)
GFR calc non Af Amer: 90 mL/min — ABNORMAL LOW (ref >90)
Total Bilirubin: 0.3 mg/dL (ref 0.3–1.2)

## 2012-04-09 LAB — CBC
MCH: 24.8 pg — ABNORMAL LOW (ref 26.0–34.0)
MCHC: 32.7 g/dL (ref 30.0–36.0)
MCV: 75.9 fL — ABNORMAL LOW (ref 78.0–100.0)
Platelets: 345 10*3/uL (ref 150–400)
RDW: 16.2 % — ABNORMAL HIGH (ref 11.5–15.5)
WBC: 13.6 10*3/uL — ABNORMAL HIGH (ref 4.0–10.5)

## 2012-04-09 MED ORDER — SODIUM CHLORIDE 0.9 % IV SOLN
INTRAVENOUS | Status: DC
  Administered 2012-04-09: 04:00:00 via INTRAVENOUS

## 2012-04-09 MED ORDER — ONDANSETRON HCL 4 MG PO TABS: 4.0000 mg | ORAL_TABLET | Freq: Four times a day (QID) | ORAL | Status: DC | PRN

## 2012-04-09 MED ORDER — BIOTENE DRY MOUTH MT LIQD
15.0000 mL | Freq: Two times a day (BID) | OROMUCOSAL | Status: DC
  Administered 2012-04-09 (×2): 15 mL via OROMUCOSAL

## 2012-04-09 MED ORDER — ONDANSETRON HCL 4 MG/2ML IJ SOLN: 4.0000 mg | Freq: Four times a day (QID) | INTRAMUSCULAR | Status: DC | PRN

## 2012-04-09 MED ORDER — METRONIDAZOLE 500 MG PO TABS
500.0000 mg | ORAL_TABLET | Freq: Three times a day (TID) | ORAL | Status: DC
  Administered 2012-04-09 – 2012-04-10 (×3): 500 mg via ORAL
  Filled 2012-04-09 (×6): qty 1

## 2012-04-09 MED ORDER — PREDNISONE 20 MG PO TABS
60.0000 mg | ORAL_TABLET | Freq: Once | ORAL | Status: AC
  Administered 2012-04-09: 60 mg via ORAL
  Filled 2012-04-09: qty 3

## 2012-04-09 MED ORDER — ALBUTEROL SULFATE (5 MG/ML) 0.5% IN NEBU
5.0000 mg | INHALATION_SOLUTION | Freq: Once | RESPIRATORY_TRACT | Status: AC
  Administered 2012-04-09: 5 mg via RESPIRATORY_TRACT
  Filled 2012-04-09: qty 40

## 2012-04-09 MED ORDER — METRONIDAZOLE 500 MG PO TABS: 500.0000 mg | ORAL_TABLET | Freq: Three times a day (TID) | ORAL | Status: DC

## 2012-04-09 MED ORDER — SODIUM CHLORIDE 0.9 % IV SOLN
1.0000 g | INTRAVENOUS | Status: AC
  Administered 2012-04-09: 1 g via INTRAVENOUS
  Filled 2012-04-09: qty 1

## 2012-04-09 MED ORDER — DEXTROSE IN LACTATED RINGERS 5 % IV SOLN
INTRAVENOUS | Status: DC
  Administered 2012-04-09: 02:00:00 via INTRAVENOUS

## 2012-04-09 MED ORDER — CIPROFLOXACIN HCL 500 MG PO TABS
500.0000 mg | ORAL_TABLET | Freq: Two times a day (BID) | ORAL | Status: DC
  Administered 2012-04-09 (×2): 500 mg via ORAL
  Filled 2012-04-09 (×5): qty 1

## 2012-04-09 MED ORDER — MORPHINE SULFATE 2 MG/ML IJ SOLN
2.0000 mg | INTRAMUSCULAR | Status: DC | PRN
  Administered 2012-04-09: 4 mg via INTRAVENOUS
  Administered 2012-04-09: 2 mg via INTRAVENOUS
  Filled 2012-04-09: qty 1
  Filled 2012-04-09: qty 2

## 2012-04-09 MED ORDER — ALBUTEROL SULFATE HFA 108 (90 BASE) MCG/ACT IN AERS
2.0000 | INHALATION_SPRAY | RESPIRATORY_TRACT | Status: DC
  Administered 2012-04-09 (×6): 2 via RESPIRATORY_TRACT
  Filled 2012-04-09: qty 6.7

## 2012-04-09 MED ORDER — ENOXAPARIN SODIUM 40 MG/0.4ML ~~LOC~~ SOLN
40.0000 mg | SUBCUTANEOUS | Status: DC
  Filled 2012-04-09 (×2): qty 0.4

## 2012-04-09 MED ORDER — SULFAMETHOXAZOLE-TRIMETHOPRIM 800-160 MG PO TABS: 1.0000 | ORAL_TABLET | Freq: Two times a day (BID) | ORAL | Status: DC

## 2012-04-09 MED ORDER — CHLORHEXIDINE GLUCONATE 0.12 % MT SOLN
15.0000 mL | Freq: Two times a day (BID) | OROMUCOSAL | Status: DC
  Filled 2012-04-09 (×2): qty 15

## 2012-04-09 NOTE — ED Provider Notes (Signed)
History     CSN: [id number]  Arrival date & time 04/08/12  1630   First MD Initiated Contact with Patient 04/08/12 2149      Chief Complaint  Patient presents with  . Shortness of Breath  . Abdominal Pain    (Consider location/radiation/quality/duration/timing/severity/associated sxs/prior treatment) HPIPamela Judie Petit Schaefer is a 41 y.o. female the past medical history of asthma presenting with couple weeks history of cough, and couple days history of right upper quadrant pain and  Left and right lower quadrant pain. Abdominal pain basically started today, associated with nausea. Patient's pain is worse in the right lower quadrant than left lower quadrant. It is worse with coughing, walking. She has not taken anything for it. Patient's pain is sharp, 10/10, nonradiating, not associated with fever, chills, bloody stools, bloody emesis, emesis, diarrhea.  Past Medical History  Diagnosis Date  . Asthma   . Obesity   . Seasonal allergies     Past Surgical History  Procedure Date  . Tubal ligation   . Cesarean section   . Forehead reconstruction     as a child    No family history on file.  History  Substance Use Topics  . Smoking status: Former Smoker    Types: Cigarettes    Quit date: 08/12/2011  . Smokeless tobacco: Not on file  . Alcohol Use: 0.0 oz/week     occasion    OB History    Grav Para Term Preterm Abortions TAB SAB Ect Mult Living                  Review of Systems At least 10pt or greater review of systems completed and are negative except where specified in the HPI.  Allergies  Ibuprofen and Azithromycin  Home Medications   Current Outpatient Rx  Name Route Sig Dispense Refill  . ALBUTEROL SULFATE HFA 108 (90 BASE) MCG/ACT IN AERS Inhalation Inhale 1-2 puffs into the lungs every 6 (six) hours as needed for wheezing. 1 Inhaler 2    BP 110/92  Pulse 102  Temp 99.5 F (37.5 C) (Oral)  Resp 17  SpO2 98%  LMP 04/02/2012  Physical Exam  Nursing  notes reviewed.  Electronic medical record reviewed. VITAL SIGNS:   Filed Vitals:   04/09/12 1926 04/09/12 2100 04/09/12 2354 04/10/12 0628  BP:  130/81  121/79  Pulse:  90  85  Temp:  98.2 F (36.8 C)  98.6 F (37 C)  TempSrc:      Resp:  18  18  Height:      Weight:      SpO2: 97% 98% 97% 98%   CONSTITUTIONAL: Awake, oriented, appears non-toxic HENT: Atraumatic, normocephalic, oral mucosa pink and moist, airway patent. Nares patent without drainage. External ears normal. EYES: Conjunctiva clear, EOMI, PERRLA NECK: Trachea midline, non-tender, supple CARDIOVASCULAR: Normal heart rate, Normal rhythm, No murmurs, rubs, gallops PULMONARY/CHEST: Clear to auscultation, no rhonchi, wheezes, or rales. Symmetrical breath sounds. Non-tender. ABDOMINAL: Non-distended, soft, tender to palpation in the lower quadrants left greater than right, voluntary guarding no rebound tenderness, moving all four extremities, no gross sensory or motor deficits. EXTREMITIES: No clubbing, cyanosis, or edema SKIN: Warm, Dry, No erythema, No rash ED Course  Procedures (including critical care time)  Labs Reviewed  CBC WITH DIFFERENTIAL - Abnormal; Notable for the following:    WBC 14.6 (*)     Hemoglobin 10.7 (*)     HCT 32.3 (*)     MCV 75.6 (*)  MCH 25.1 (*)     RDW 16.2 (*)     Neutro Abs 10.7 (*)     Monocytes Absolute 1.3 (*)     All other components within normal limits  BASIC METABOLIC PANEL - Abnormal; Notable for the following:    GFR calc non Af Amer 80 (*)     All other components within normal limits  URINALYSIS, ROUTINE W REFLEX MICROSCOPIC - Abnormal; Notable for the following:    Color, Urine AMBER (*)  BIOCHEMICALS MAY BE AFFECTED BY COLOR   Bilirubin Urine SMALL (*)     Ketones, ur >80 (*)     All other components within normal limits  LIPASE, BLOOD - Abnormal; Notable for the following:    Lipase 10 (*)     All other components within normal limits  HEPATIC FUNCTION PANEL    PREGNANCY, URINE   No results found.   1. Diverticulitis   2. Adnexal mass   3. Lower abdominal pain   4. Asthma in adult   5. Diverticulitis of sigmoid colon   6. Microcytic normochromic anemia   7. Hidradenitis suppurativa       MDM  patient's presentation is concerning for  diverticulitis and also considering appendicitis given her a right lower quadrant pain. Patient says she also has a history of having ovarian cysts-as also the differential considering a hemorrhagic cyst. Patient denies any dysuria, vaginal discharge I think PID or tubo-ovarian abscess less likely. She has a history of Crohn's disease or ulcerative colitis I do not think an abscess is on the differential this time. Patient is nontoxic, afebrile, do not think we need to any emergent surgical consult at this time. Will work patient up obtain CT of abdomen and pelvis to treat pain aggressively.   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 These findings explain the patient's pain. Discussed this with the internal medicine team patient will be admitted for further diagnostics pain control and antibiotic therapy. She has been started empirically on ceftriaxone and metronidazole.         Jones Skene, MD 04/13/12 0145

## 2012-04-09 NOTE — H&P (Signed)
Hospital Admission Note Date: 04/09/2012  Patient name: Anne Schaefer Medical record number: [id number] Date of birth: Apr 13, 1971 Age: 41 y.o. Gender: female PCP: DEFAULT,PROVIDER, MD  Medical Service: Internal Medicine Teaching Service--[facility]  Attending physician: Dr. Lars Mage    1st Contact: Dr. Earlene Plater   Pager:[phone] 2nd Contact: Dr. Dierdre Searles    [id number] After 5 pm or weekends: 1st Contact:      Pager: [phone] 2nd Contact:      Pager: [phone]  Chief Complaint: Abdominal pain  History of Present Illness:Ms. Hakimian is a 41 year old African American female with PMH of asthma presenting to the ED today after waking up Saturday 04/08/12 ~8am with severe lower abdominal pain.  She claims she had finished her menstrual period on Thursday and started experience crampy lower intermittent abdominal pain.  Friday night, she went out with her friends, had dinner, drank two beers, smoked marijuana and then went to bed and woke up with extreme pain in the morning.  She denies any similar episodes in the past.  The pain was 10/10, worse on the right side, radiating to the back, and no alleviating or exacerbating factors.  She did not eat anything today but has been eating a lot of ice lately.  She also claims to be having two days of loose BM and noticing possible blood in her stool but is not sure.  She denies any nausea, vomiting, headaches, chest pain, shortness of breath, or urinary complaints at this time.  Ms. Fincher has been out of her inhaler for approximately one week now and endorses being currently wheezy.  She does not feel short of breath but has an occasional productive cough with white sputum at time.  She is tearful during our conversation and exam.   Of note, Ms. Empie also has dry eczematous looking skin changes on b/l nipples, between breast folds, and lymphadenopathy  (painless on left underarm and tender on right underarm).  Her mother passed away in 11/07/2022 from heart disease.  She  works temporary jobs, recently [employer] her job, and had quit smoking cigarettes since January 2013.  She continues to smoke marijuana and drink alcohol.  She was last seen in the ED on 03/21/12 for acute asthma exacerbation and was discharged home on albuterol, tessalon perles, and prednisone.   Meds: Current Outpatient Rx  Name Route Sig Dispense Refill  . ALBUTEROL SULFATE HFA 108 (90 BASE) MCG/ACT IN AERS Inhalation Inhale 1-2 puffs into the lungs every 6 (six) hours as needed for wheezing. 1 Inhaler 2    Allergies: Allergies as of 04/08/2012 - Review Complete 04/08/2012  Allergen Reaction Noted  . Ibuprofen Shortness Of Breath 05/25/2011  . Azithromycin Hives and Rash 05/25/2011   Past Medical History  Diagnosis Date  . Asthma   . Obesity   . Seasonal allergies    Past Surgical History  Procedure Date  . Tubal ligation   . Cesarean section   . Forehead reconstruction     as a child   No family history on file. History   Social History  . Marital Status: Single    Spouse Name: N/A    Number of Children: N/A  . Years of Education: N/A   Occupational History  . Not on file.   Social History Main Topics  . Smoking status: Former Smoker    Types: Cigarettes    Quit date: 08/12/2011  . Smokeless tobacco: Not on file  . Alcohol Use: 0.0 oz/week  occasion  . Drug Use: Yes    Special: Marijuana     occasionally  . Sexually Active: Not on file   Other Topics Concern  . Not on file   Social History Narrative  . No narrative on file   Review of Systems: Pertinent items are noted in HPI.  Physical Exam: Blood pressure 123/56, pulse 92, temperature 99.5 F (37.5 C), temperature source Oral, resp. rate 18, last menstrual period 04/02/2012, SpO2 98.00%. Vitals reviewed. General: resting in bed, NAD HEENT: PERRLA, EOMI, no scleral icterus Cardiac: Tachycardia, no rubs, murmurs or gallops Pulm: anterior wheezing, posterior left upper lobe wheezing on exhalation,  no rales, or rhonchi Abd: soft, obese, + stretch marks, tender to light palpation suprapubic region, right>left, + b/l cva tenderness, nondistended, BS present hyperactive, not high pitch, - guarding  Ext: warm and well perfused, no pedal edema, +2dp b/l Neuro: alert and oriented X3, cranial nerves II-XII grossly intact, strength and sensation to light touch equal in bilateral upper and lower extremities  Lab results: Basic Metabolic Panel:  Basename 04/08/12 1715  NA 136  K 3.6  CL 102  CO2 23  GLUCOSE 98  BUN 7  CREATININE 0.90  CALCIUM 9.7  MG --  PHOS --   Liver Function Tests:  Basename 04/08/12 1715  AST 19  ALT 11  ALKPHOS 61  BILITOT 0.5  PROT 7.6  ALBUMIN 3.8    Basename 04/08/12 1715  LIPASE 10*  AMYLASE --   CBC:  Basename 04/08/12 1715  WBC 14.6*  NEUTROABS 10.7*  HGB 10.7*  HCT 32.3*  MCV 75.6*  PLT 395   Urinalysis:  Basename 04/08/12 1735  COLORURINE AMBER*  LABSPEC 1.023  PHURINE 5.5  GLUCOSEU NEGATIVE  HGBUR NEGATIVE  BILIRUBINUR SMALL*  KETONESUR >80*  PROTEINUR NEGATIVE  UROBILINOGEN 1.0  NITRITE NEGATIVE  LEUKOCYTESUR NEGATIVE   Imaging results:  Dg Chest 2 View  04/08/2012  *RADIOLOGY REPORT*  Clinical Data: Short of breath  CHEST - 2 VIEW  Comparison: Chest radiograph 03/21/2012  Findings: Normal heart silhouette.  There is chronic blunting of the right costophrenic angle.  No pulmonary edema.  No focal consolidation.  No pneumothorax.  No aggressive osseous lesions.  IMPRESSION: Chronic blunting of the right costophrenic angle representing chronic effusion or scarring.   No change from prior.   Original Report Authenticated By: Genevive Bi, M.D.    Ct Abdomen Pelvis W Contrast  04/09/2012  *RADIOLOGY REPORT*  Clinical Data: Short of breath, abdominal pain.  Leukocytosis.  CT ABDOMEN AND PELVIS WITH CONTRAST  Technique:  Multidetector CT imaging of the abdomen and pelvis was performed following the standard protocol during  bolus administration of intravenous contrast.  Contrast: OMNIPAQUE IOHEXOL 300 MG/ML  SOLN  Comparison: None.  Findings: Lung bases are clear.  No pericardial fluid.  No focal hepatic lesion.  The gallbladder, pancreas, spleen, adrenal glands, and kidneys are normal.  The stomach, small bowel, appendix, and cecum are normal. Ascending, transverse and descending colon are normal.  Within the mid a portion of the sigmoid colon there is exuberant inflammatory reaction surrounding colon with bowel wall thickening and pericolonic fat stranding.  There are multiple diverticula through this region.  There is suggestion of small contained perforation versus large diverticulum along the right aspect of the colon (image 66).  The findings are most suggestive of acute diverticulitis.  This involves approximately 10 cm segment of the sigmoid colon.  Rectum appears normal.  The uterus is normal.  In the right adnexa there is a fairly well-circumscribed fatty lesion with a small calcification which measures 4.5 by 4.6 x 5.9 cm.  This is most likely a dermoid tumor.  The right ovary appears to be deep to this lesion (image 67).  The left ovary appears normal.  No free fluid the pelvis.  No pelvic abscess.  No pelvic lymphadenopathy.  IMPRESSION:  1.  Exuberant inflammatory process involving the sigmoid colon with bowel wall thickening in the region multiple diverticula.  This is most consistent with acute diverticulitis.  Recommend follow up imaging or colonoscopy to exclude underlying neoplastic process which is felt unlikely. 2.  Fat containing  5cm right adnexal mass  is most consistent with a dermoid tumor.  Recommend non emergent GYN consultation for evaluation and management .  Findings discussed with Dr. Rulon Abide  on 04/08/2012 at 23:59 p.m.   Original Report Authenticated By: Genevive Bi, M.D.    Assessment & Plan by Problem: Ms. Augusto Garbe is a 41 year old African American female with PMH of asthma presenting with  complaints of severe suprapubic abdominal pain x3 days worsening morning of admission R>L.  CT A/P significant for acute diverticulitis--inflammatory process of sigmoid colon with multiple diverticuli along with 5cm right adnexal mass--likely dermoid tumor.  #Abdominal pain--suprapubic, R>L, likely secondary to acute sigmoid colon diverticulitis as evidenced by CT A/P.  CT also shows 5cm right adnexal mass, possible dermoid tumor.  Other etiologies including pancreatitis, cholecystitis, and/or gastritis may be less likely secondary to suprapubic location, lipase wnl, LFTs wnl, no nausea/vomiting, and hx of gallstones. Surgery consulted by ED physician--deemed non-emergent at that time. No current signs of complications including diffuse peritonitis, obstruction, perforation, abscess, or fistula.  -Admit to Med-Surg bed  -NPO tonight with ice chips, transition to clears in the morning, ADAT tomorrow -maintenance fluids with NS @ 125cc/h, ice chips -Antibiotics -1 dose Invanz given in ED, suggest IV Ceftriaxone & Metronidazole starting tomorrow AM, or if patient tolerating POs, then transition to PO cipro/flagyl for 10-14d  -Consider GI consult for colonoscopy to r/o mass  -Pain mgmt with Morphine 2-4mg  IV q4h prn  -Zofran q4h prn nausea  -if signs of complications arise including possible worsening abdominal pain, diffuse peritonitis, perforation, obstruction--re-consult surgery  #Asthma: Does not seem to be in acute flare, though 1 dose prednisone 60mg  PO given in ED.  -Albuterol 2 puffs q4h SOB/wheezing  -Monitor O2 sats   #Microcytic Anemia: Most likely secondary to menstrual cycles, but cannot rule out intestinal malignancy/infectious etiology. Hb 10.7 on admission.  -Ferritin with AML  -monitor AM cbc  #Ovarian Cyst--CT showing 5cm right adnexal mass, suggestive of possible dermoid tumor -consider non-emergent gyn consultation after acute complaints resolve  #Breast Abnormalities--dry  eczematous changes on breast folds and dry flaky skin on b/l nipples--oozing over the past few days, concerning for possible malignancy--most concerning for DCIS or Paget's disease -b/l breast ultrasound and diagnostic mammogram and biopsy given age and findings  #Pleural Effusion--Chronic blunting of the right costophrenic angle representing chronic effusion or scarring or cxr; concerning for malignant effusion vs. Atelectasis -consider diagnostic thoracentesis -consider repeat xray in decubitus position  Diet: NPO for now DVT PPx: Lovenox Dispo: pending clinical improvement  Signed: Darden Palmer 04/09/2012, 2:34 AM

## 2012-04-09 NOTE — H&P (Signed)
Internal Medicine Attending Admission Note Date: 04/09/2012  Patient name: Anne Schaefer Medical record number: [id number] Date of birth: 1971/04/28 Age: 41 y.o. Gender: female  I saw and evaluated the patient. I reviewed the resident's note and I agree with the resident's findings and plan as documented in the resident's note.  Chief Complaint(s): Abdominal pain  History - key components related to admission: Patient is a 41 year old female with past medical history most significant for asthma and obesity who was admitted with chief complaints of abdominal pain for past 2 days. Pain is described as 10 out of 10, present in the lower part of her belly, right more than left, pain radiated to her back, was associated with loose bowel movements 2-3 episodes. Patient has not had such pain in the past. She denies any nausea or vomiting at this time.   She is hungry and wants to start eating solid food. She has tolerated liquid diet well.   Physical Exam - key components related to admission:  Filed Vitals:   04/09/12 0400 04/09/12 0426 04/09/12 0628 04/09/12 0803  BP:   129/71   Pulse: 87  86   Temp: 98.1 F (36.7 C)  98.2 F (36.8 C)   TempSrc: Oral     Resp: 18  18   Height:  5\' 2"  (1.575 m)    Weight:  250 lb (113.399 kg)    SpO2: 99%  99% 98%  BP 129/71  Pulse 86  Temp 98.2 F (36.8 C) (Oral)  Resp 18  Ht 5\' 2"  (1.575 m)  Wt 250 lb (113.399 kg)  BMI 45.73 kg/m2  SpO2 98%  LMP 04/02/2012  General Appearance:    Alert, cooperative, no distress, appears stated age  Head:    Normocephalic, without obvious abnormality, atraumatic  Eyes:    PERRL, conjunctiva/corneas clear, EOM's intact, fundi    benign, both eyes                 Lungs:     Clear to auscultation bilaterally, respirations unlabored  Chest Wall:    No tenderness or deformity   Heart:    Regular rate and rhythm, S1 and S2 normal, no murmur, rub   or gallop  Breast Exam:    No tenderness, masses, or nipple  abnormality  Abdomen:     Soft, mildly tender in bilateral lower quadrant, bowel sounds active all four quadrants, no masses, no organomegaly        Extremities:   Extremities normal, atraumatic, no cyanosis or edema  Pulses:   2+ and symmetric all extremities  Skin:   Skin color, texture, turgor normal, patient had swelling in both left and right axillary region consistent with hidradenitis suppurativa   Lymph nodes:   Cervical, supraclavicular, and axillary nodes normal  Neurologic:   CNII-XII intact, normal strength, sensation and reflexes    throughout     Lab results:   Basic Metabolic Panel:  Basename 04/09/12 0725 04/08/12 1715  NA 136 136  K 4.1 3.6  CL 102 102  CO2 23 23  GLUCOSE 140* 98  BUN 9 7  CREATININE 0.82 0.90  CALCIUM 9.3 9.7  MG -- --  PHOS -- --   Liver Function Tests:  Basename 04/09/12 0725 04/08/12 1715  AST 12 19  ALT 12 11  ALKPHOS 58 61  BILITOT 0.3 0.5  PROT 7.1 7.6  ALBUMIN 3.6 3.8    Basename 04/08/12 1715  LIPASE 10*  AMYLASE --  No results found for this basename: AMMONIA:2 in the last 72 hours CBC:  Basename 04/09/12 0725 04/08/12 1715  WBC 13.6* 14.6*  NEUTROABS -- 10.7*  HGB 9.8* 10.7*  HCT 30.0* 32.3*  MCV 75.9* 75.6*  PLT 345 395     Imaging results:  Dg Chest 2 View  04/08/2012  IMPRESSION: Chronic blunting of the right costophrenic angle representing chronic effusion or scarring.   No change from prior.   Original Report Authenticated By: Genevive Bi, M.D.    Ct Abdomen Pelvis W Contrast  04/09/2012   IMPRESSION:  1.  Exuberant inflammatory process involving the sigmoid colon with bowel wall thickening in the region multiple diverticula.  This is most consistent with acute diverticulitis.  Recommend follow up imaging or colonoscopy to exclude underlying neoplastic process which is felt unlikely. 2.  Fat containing  5cm right adnexal mass  is most consistent with a dermoid tumor.  Recommend non emergent GYN  consultation for evaluation and management .  Findings discussed with Dr. Rulon Abide  on 04/08/2012 at 23:59 p.m.   Original Report Authenticated By: Genevive Bi, M.D.     Other results:  Assessment & Plan by Problem:  Principal Problem:  *Diverticulitis of sigmoid colon Active Problems:  Asthma in adult  Microcytic normochromic anemia  Problem 1 diverticulitis: Patient started on ciprofloxacin and Flagyl. She may need to change her diet to make sure that she includes high fiber diet. Patient is tolerating clear liquids and we will advance diet today. Patient is already much improved symptomatically. She may be discharged home today once she tolerates oral diet. Patient may need a followup colonoscopy to evaluate for cause of diverticulitis.  Problem #2 hydradenitis suppurativa: We will check HbA1c to look for diabetes as a cause of her skin infections. She should be discharged home on doxycycline or Bactrim to cover for MRSA.   Problem #3 ovarian cyst: Patient should followup with gynecology as outpatient.  Lars Mage MD Pager [phone]

## 2012-04-10 DIAGNOSIS — R234 Changes in skin texture: Secondary | ICD-10-CM

## 2012-04-10 DIAGNOSIS — D287 Benign neoplasm of other specified female genital organs: Secondary | ICD-10-CM

## 2012-04-10 MED ORDER — SULFAMETHOXAZOLE-TMP DS 800-160 MG PO TABS
1.0000 | ORAL_TABLET | Freq: Two times a day (BID) | ORAL | Status: DC
  Administered 2012-04-10: 1 via ORAL
  Filled 2012-04-10 (×2): qty 1

## 2012-04-10 MED ORDER — SULFAMETHOXAZOLE-TRIMETHOPRIM 800-160 MG PO TABS: 1.0000 | ORAL_TABLET | Freq: Two times a day (BID) | ORAL | Status: DC

## 2012-04-10 MED ORDER — MORPHINE SULFATE 2 MG/ML IJ SOLN
2.0000 mg | INTRAMUSCULAR | Status: DC | PRN
  Administered 2012-04-10: 2 mg via INTRAVENOUS
  Filled 2012-04-10: qty 1

## 2012-04-10 MED ORDER — METRONIDAZOLE 500 MG PO TABS
500.0000 mg | ORAL_TABLET | Freq: Once | ORAL | Status: AC
  Administered 2012-04-10: 500 mg via ORAL
  Filled 2012-04-10: qty 1

## 2012-04-10 MED ORDER — METRONIDAZOLE 500 MG PO TABS: 500.0000 mg | ORAL_TABLET | Freq: Three times a day (TID) | ORAL | Status: DC

## 2012-04-10 NOTE — Care Management Note (Signed)
  Page 1 of 1   04/10/2012     11:40:58 AM   CARE MANAGEMENT NOTE 04/10/2012  Patient:  Anne Schaefer, Anne Schaefer   Account Number:  [id number]  Date Initiated:  04/10/2012  Documentation initiated by:  Ronny Flurry  Subjective/Objective Assessment:     Action/Plan:   Anticipated DC Date:  04/10/2012   Anticipated DC Plan:  HOME/SELF CARE         Choice offered to / List presented to:             Status of service:  Completed, signed off Medicare Important Message given?   (If response is "NO", the following Medicare IM given date fields will be blank) Date Medicare IM given:   Date Additional Medicare IM given:    Discharge Disposition:    Per UR Regulation:    If discussed at Long Length of Stay Meetings, dates discussed:    Comments:  04-10-12 Referral for : PLEASE PROVIDE PATIENT WITH A LIST OF GYNECOLOGIST THAT ACCEPT MEDICAID .  Explained to patient she can call number on her medicaid card for list of GYN MD's accepting Medicaid . Gave her [facility] number also .  Patient voiced understanding and stated she was first going to call her former GYN to see if he is still accepting Medicaid .   Ronny Flurry RN BSN [phone]

## 2012-04-10 NOTE — Discharge Summary (Signed)
Patient Name:  Anne Schaefer MRN: [id number]  PCP: Provider Default, MD DOB:  1971/04/01       Date of Admission:  04/08/2012  Date of Discharge:  04/10/2012      Attending Physician: Inez Catalina, MD         DISCHARGE DIAGNOSES: 1.   Diverticulitis:  Mild, uncomplicated. Home on Bactrim DS and metronidazole. 2.   Hidradenitis suppurativa:  Bactrim DS. 3.   Areolar skin changes:  Montgomery gland inflammation. 4.   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 Recommend GYN follow-up. 5.   Asthma: Stable.   DISPOSITION AND FOLLOW-UP: Anne Schaefer is to follow-up with the listed providers as detailed below, at which time, the following should be addressed:   1. Follow-up visits: 1. INTERNAL MEDICINE 1. Refer to gynecology for follow-up on adnexal mass. 2. Evaluate resolution of hidradenitis suppurativa. 3. Areolar skin changes - we recommended warm compresses, evaluate for resolution.    Follow-up Information    Follow up with Dorrene German, MD.   Contact information:   [address] [place] [address] [phone]         Discharge Orders    Future Orders Please Complete By Expires   Diet general      Comments:   Advance your diet slowly over the next few days as you recover.   Increase activity slowly      Call MD for:  temperature >100.4          DISCHARGE MEDICATIONS:   Medication List     As of 04/10/2012 10:36 AM    TAKE these medications         albuterol 108 (90 BASE) MCG/ACT inhaler   Commonly known as: PROVENTIL HFA;VENTOLIN HFA   Inhale 1-2 puffs into the lungs every 6 (six) hours as needed for wheezing.      metroNIDAZOLE 500 MG tablet   Commonly known as: FLAGYL   Take 1 tablet (500 mg total) by mouth 3 (three) times daily with meals. First dose is dinner dose today (9/30). Last dose on 10/12.      sulfamethoxazole-trimethoprim 800-160 MG per tablet   Commonly known as: BACTRIM DS,SEPTRA DS   Take 1 tablet  by mouth 2 (two) times daily. First dose is evening dose today (9/30). Last dose on 10/12.         CONSULTS:  NONE   PROCEDURES PERFORMED:  Dg Chest 2 View 04/08/2012  FINDINGS: Normal heart silhouette.  There is chronic blunting of the right costophrenic angle.  No pulmonary edema.  No focal consolidation.  No pneumothorax.  No aggressive osseous lesions.   IMPRESSION: Chronic blunting of the right costophrenic angle representing chronic effusion or scarring.   No change from prior.   Ct Abdomen Pelvis W Contrast 04/09/2012 FINDINGS: Lung bases are clear.  No pericardial fluid.  No focal hepatic lesion.  The gallbladder, pancreas, spleen, adrenal glands, and kidneys are normal.  The stomach, small bowel, appendix, and cecum are normal. Ascending, transverse and descending colon are normal.  Within the mid a portion of the sigmoid colon there is exuberant inflammatory reaction surrounding colon with bowel wall thickening and pericolonic fat stranding.  There are multiple diverticula through this region.  There is suggestion of small contained perforation versus large diverticulum along the right aspect of the colon (image 66).  The findings are most suggestive of acute diverticulitis.  This involves approximately 10 cm segment of the  sigmoid colon.  Rectum appears normal.  The uterus is normal.  In the right adnexa there is a fairly well-circumscribed fatty lesion with a small calcification which measures 4.5 by 4.6 x 5.9 cm.  This is most likely a dermoid tumor.  The right ovary appears to be deep to this lesion (image 67).  The left ovary appears normal.  No free fluid the pelvis.  No pelvic abscess.  No pelvic lymphadenopathy.   IMPRESSION:  1.  Exuberant inflammatory process involving the sigmoid colon with bowel wall thickening in the region multiple diverticula.  This is most consistent with acute diverticulitis.  Recommend follow up imaging or colonoscopy to exclude underlying neoplastic  process which is felt unlikely. 2.  Fat containing  5cm right adnexal mass  is most consistent with a dermoid tumor.  Recommend non emergent GYN consultation for evaluation and management .   ADMISSION DATA: H&P: Anne Schaefer is a 41 year old African American female with PMH of asthma presenting to the ED today after waking up Saturday 04/08/12 ~8am with severe lower abdominal pain. She claims she had finished her menstrual period on Thursday and started experience crampy lower intermittent abdominal pain. Friday night, she went out with her friends, had dinner, drank two beers, smoked marijuana and then went to bed and woke up with extreme pain in the morning. She denies any similar episodes in the past. The pain was 10/10, worse on the right side, radiating to the back, and no alleviating or exacerbating factors. She did not eat anything today but has been eating a lot of ice lately. She also claims to be having two days of loose BM and noticing possible blood in her stool but is not sure. She denies any nausea, vomiting, headaches, chest pain, shortness of breath, or urinary complaints at this time. Anne Schaefer has been out of her inhaler for approximately one week now and endorses being currently wheezy. She does not feel short of breath but has an occasional productive cough with white sputum at time. She is tearful during our conversation and exam.  Of note, Anne Schaefer also has dry eczematous looking skin changes on b/l nipples, between breast folds, and lymphadenopathy (painless on left underarm and tender on right underarm). Her mother passed away in 10/23/2022 from heart disease. 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 She continues to smoke marijuana and drink alcohol. She was last seen in the ED on 03/21/12 for acute asthma exacerbation and was discharged home on albuterol, tessalon perles, and prednisone.   Physical Exam: Vitals: Blood pressure  123/56, pulse 92, temperature 99.5 F (37.5 C), temperature source Oral, resp. rate 18, last menstrual period 04/02/2012, SpO2 98.00%.  Vitals reviewed.  General: resting in bed, NAD  HEENT: PERRLA, EOMI, no scleral icterus  Cardiac: Tachycardia, no rubs, murmurs or gallops  Pulm: anterior wheezing, posterior left upper lobe wheezing on exhalation, no rales, or rhonchi  Abd: soft, obese, + stretch marks, tender to light palpation suprapubic region, right>left, + b/l cva tenderness, nondistended, BS present hyperactive, not high pitch, - guarding  Ext: warm and well perfused, no pedal edema, +2dp b/l  Neuro: alert and oriented X3, cranial nerves II-XII grossly intact, strength and sensation to light touch equal in bilateral upper and lower extremities   Labs: Basic Metabolic Panel:  Basename  04/08/12 1715   NA  136   K  3.6   CL  102  CO2  23   GLUCOSE  98   BUN  7   CREATININE  0.90   CALCIUM  9.7   MG  --   PHOS  --    Liver Function Tests:  Basename  04/08/12 1715   AST  19   ALT  11   ALKPHOS  61   BILITOT  0.5   PROT  7.6   ALBUMIN  3.8    Basename  04/08/12 1715   LIPASE  10*   AMYLASE  --    CBC:  Basename  04/08/12 1715   WBC  14.6*   NEUTROABS  10.7*   HGB  10.7*   HCT  32.3*   MCV  75.6*   PLT  395    Urinalysis:  Basename  04/08/12 1735   COLORURINE  AMBER*   LABSPEC  1.023   PHURINE  5.5   GLUCOSEU  NEGATIVE   HGBUR  NEGATIVE   BILIRUBINUR  SMALL*   KETONESUR  >80*   PROTEINUR  NEGATIVE   UROBILINOGEN  1.0   NITRITE  NEGATIVE   LEUKOCYTESUR  NEG    HOSPITAL COURSE: 1.   Diverticulitis:  Mild, uncomplicated case of diverticulitis of the sigmoid colon. Successful transition to oral antibiotics. Discharged on a regimen of Bactrim DS, 1 tablet BID and metronidazole 500mg  TID for another 12 days.  2.   Hidradenitis suppurativa:  We will cover this with the Bactrim DS being used to treat diverticulitis.  3.   Areolar skin changes:  Consistent  with inflamed Montgomery glands. Recommended patient use warm compresses once she gets home.  4.   Right adnexal dermoid tumor:  Radiographic findings consistent with dermoid tumor. We will have the patient follow up with gynecology as an outpatient to have further work-up. Case manager provided her a list of providers who accept medicaid.  5.   Asthma:  Stable.  Kept on albuterol MDI as needed.   DISCHARGE DATA: Vital Signs: BP 121/79  Pulse 85  Temp 98.6 F (37 C) (Oral)  Resp 18  Ht 5\' 2"  (1.575 m)  Wt 250 lb (113.399 kg)  BMI 45.73 kg/m2  SpO2 98%  LMP 04/02/2012  Labs:  Hemoglobin A1c 6.0   Time spent on discharge: 42 mintues   Signed by:  Dorthula Rue. Earlene Plater, MD PGY-I, Internal Medicine  04/12/2012, 9:51 AM

## 2012-04-10 NOTE — Progress Notes (Signed)
   Subjective:    Interval Events:  Anne Schaefer slept well last night, but did require 2mg  of morphine for abdominal pain.  She was able to eat some food later in the evening after having some nausea with lunch.  Her abdomen feels better this morning.    Objective:    Vital Signs:   Temp:  [97.7 F (36.5 C)-98.6 F (37 C)] 98.6 F (37 C) (09/30 0628) Pulse Rate:  [85-90] 85  (09/30 0628) Resp:  [18] 18  (09/30 0628) BP: (121-130)/(79-83) 121/79 mmHg (09/30 0628) SpO2:  [97 %-100 %] 98 % (09/30 0628) Last BM Date: 04/08/12   Weights: Filed Weights   04/09/12 0426  Weight: 250 lb (113.399 kg)    Intake/Output:   Intake/Output Summary (Last 24 hours) at 04/10/12 0825 Last data filed at 04/10/12 0500  Gross per 24 hour  Intake    490 ml  Output      0 ml  Net    490 ml      Physical Exam: General appearance: alert, cooperative and no distress Resp: clear to auscultation bilaterally Breasts: No nipple retraction or dimpling; no nipple discharge or bleeding; normal to palpation without dominant masses; left areola is tender to palpation and wrinkled but without skin induration or inflammatory changes; milky/oily secretion over areola, but not from nipple; similar appearence of right areola but confined to small focus at 10 o'clock position. Cardio: regular rate and rhythm GI: soft, mild tenderness suprapubiclly, normal bowel sounds    Labs: Hemoglobin A1c:  6.0    Medications:    Infusions:    . DISCONTD: sodium chloride 125 mL/hr at 04/09/12 0405     Scheduled Medications:    . albuterol  2 puff Inhalation Q4H  . antiseptic oral rinse  15 mL Mouth Rinse q12n4p  . enoxaparin (LOVENOX) injection  40 mg Subcutaneous Q24H  . metroNIDAZOLE  500 mg Oral Q8H  . sulfamethoxazole-trimethoprim  1 tablet Oral Q12H  . DISCONTD: chlorhexidine  15 mL Mouth Rinse BID  . DISCONTD: ciprofloxacin  500 mg Oral BID     PRN Medications: morphine injection, ondansetron  (ZOFRAN) IV, ondansetron, DISCONTD:  morphine injection    Assessment/ Plan:     1.   Diverticulitis:  Mild, uncomplicated case of diverticulitis of the sigmoid colon.  Successful transition to oral antibiotics yesterday.  We will discharge her today on a regimen of Bactrim DS, 1 tablet BID and metronidazole 500mg  TID for another 12 days. - Bactrim DS, 1 tablet BID - Metronidazole, 500mg  TID  2.   Hidradenitis suppurativa:  Will we cover this with the Bactrim DS that we are using for diverticulitis.  She will follow up with her PCP.  3.   Areolar skin changes:  Consistent with inflamed Montgomery glands.  Recommended patient use warm compresses once she gets home.  4.   Right adnexal dermoid tumor:  Radiographic findings consistent with dermoid tumor.  We will have the patient follow up with gynecology as an outpatient to have further work-up.  Case manager will provide her a list of providers who accept medicaid.   5.   Asthma:  Stable.  Kept on albuterol MDI as needed.  6.   Prophylaxis: - enoxaparin 40mg  [place] daily  7.   Disposition:  Discharge home.   Length of Stay: 2 days   Signed by:  Dorthula Rue. Earlene Plater, MD PGY-I, Internal Medicine Pager [phone]  04/10/2012, 8:25 AM

## 2012-04-10 NOTE — Progress Notes (Signed)
Discharge home.

## 2012-04-17 ENCOUNTER — Emergency Department (HOSPITAL_COMMUNITY): Payer: Medicaid Other

## 2012-04-17 ENCOUNTER — Observation Stay (HOSPITAL_COMMUNITY)
Admission: EM | Admit: 2012-04-17 | Discharge: 2012-04-17 | Disposition: A | Discharge status: Home / Self Care | Payer: Medicaid Other | Attending: Emergency Medicine | Admitting: Emergency Medicine

## 2012-04-17 ENCOUNTER — Encounter (HOSPITAL_COMMUNITY): Payer: Self-pay | Admitting: Emergency Medicine

## 2012-04-17 DIAGNOSIS — E669 Obesity, unspecified: Secondary | ICD-10-CM | POA: Insufficient documentation

## 2012-04-17 DIAGNOSIS — J45901 Unspecified asthma with (acute) exacerbation: Principal | ICD-10-CM | POA: Insufficient documentation

## 2012-04-17 MED ORDER — ONDANSETRON HCL 4 MG/2ML IJ SOLN
4.0000 mg | Freq: Four times a day (QID) | INTRAMUSCULAR | Status: DC | PRN
  Administered 2012-04-17: 4 mg via INTRAVENOUS
  Filled 2012-04-17: qty 2

## 2012-04-17 MED ORDER — ALBUTEROL SULFATE (5 MG/ML) 0.5% IN NEBU
INHALATION_SOLUTION | RESPIRATORY_TRACT | Status: AC
  Filled 2012-04-17: qty 1

## 2012-04-17 MED ORDER — METHYLPREDNISOLONE SODIUM SUCC 40 MG IJ SOLR
40.0000 mg | Freq: Four times a day (QID) | INTRAMUSCULAR | Status: DC
  Administered 2012-04-17 (×2): 40 mg via INTRAVENOUS
  Filled 2012-04-17 (×2): qty 1

## 2012-04-17 MED ORDER — ALBUTEROL (5 MG/ML) CONTINUOUS INHALATION SOLN
10.0000 mg/h | INHALATION_SOLUTION | Freq: Once | RESPIRATORY_TRACT | Status: AC
  Administered 2012-04-17: 10 mg/h via RESPIRATORY_TRACT
  Filled 2012-04-17: qty 20

## 2012-04-17 MED ORDER — PREDNISONE 20 MG PO TABS: 60.0000 mg | ORAL_TABLET | Freq: Every day | ORAL | Status: DC

## 2012-04-17 MED ORDER — IPRATROPIUM BROMIDE 0.02 % IN SOLN
0.5000 mg | Freq: Once | RESPIRATORY_TRACT | Status: DC
  Filled 2012-04-17: qty 2.5

## 2012-04-17 MED ORDER — PREDNISONE 20 MG PO TABS
60.0000 mg | ORAL_TABLET | Freq: Once | ORAL | Status: AC
  Administered 2012-04-17: 60 mg via ORAL
  Filled 2012-04-17: qty 3

## 2012-04-17 MED ORDER — ALBUTEROL SULFATE (5 MG/ML) 0.5% IN NEBU
5.0000 mg | INHALATION_SOLUTION | Freq: Once | RESPIRATORY_TRACT | Status: AC
  Administered 2012-04-17: 5 mg via RESPIRATORY_TRACT
  Filled 2012-04-17: qty 1

## 2012-04-17 MED ORDER — FAMOTIDINE 20 MG PO TABS
20.0000 mg | ORAL_TABLET | Freq: Two times a day (BID) | ORAL | Status: DC
  Administered 2012-04-17: 20 mg via ORAL
  Filled 2012-04-17 (×2): qty 1

## 2012-04-17 MED ORDER — IPRATROPIUM BROMIDE 0.02 % IN SOLN
RESPIRATORY_TRACT | Status: AC
  Filled 2012-04-17: qty 2.5

## 2012-04-17 MED ORDER — ACETAMINOPHEN 325 MG PO TABS: 650.0000 mg | ORAL_TABLET | ORAL | Status: DC | PRN

## 2012-04-17 MED ORDER — MAGNESIUM SULFATE 40 MG/ML IJ SOLN
2.0000 g | Freq: Two times a day (BID) | INTRAMUSCULAR | Status: DC
  Administered 2012-04-17: 2 g via INTRAVENOUS
  Filled 2012-04-17: qty 50

## 2012-04-17 MED ORDER — ALBUTEROL SULFATE HFA 108 (90 BASE) MCG/ACT IN AERS: 1.0000 | INHALATION_SPRAY | Freq: Four times a day (QID) | RESPIRATORY_TRACT | Status: DC | PRN

## 2012-04-17 NOTE — Progress Notes (Signed)
Post peak flow was 280, then 350 at second attempt. Pt now has only expiratory wheeze bilaterally and appears more comfortable. RT will continue to monitor.

## 2012-04-17 NOTE — Progress Notes (Signed)
RT called to assess pt for Asthma protocol. Pt achieved around 180 on Peak flow. Pt has already had two neb treatments early this am prior to peak flow. Pt in no obvious distress and sats are around 97% on RA. RT will reassess with post peak flow and will continue to monitor.

## 2012-04-17 NOTE — ED Notes (Signed)
Per EMS:  Pt used albuterol inhaler last night about three times with no relief.  SOB increased today with wheezing.  Pt received 10 mg albuterol and 0.5 mg atrovent via EMS.

## 2012-04-17 NOTE — ED Notes (Signed)
Pt. Ambulating without no problems o2 sat was99% heartrate 113 .she ambulate well.

## 2012-04-17 NOTE — ED Provider Notes (Signed)
History     CSN: [id number]  Arrival date & time 04/17/12  0431   First MD Initiated Contact with Patient 04/17/12 [phone]      Chief Complaint  Patient presents with  . Asthma    (Consider location/radiation/quality/duration/timing/severity/associated sxs/prior treatment) HPI HX per PT. HAs asthma and inhaler at home with onset of wheezing and SOB yesterday worse today. Triggers include change in weather that she attributes to her asthma exacerbation today. Mod in severity, symptoms worse with exertion not improved with her inhaler. Dry cough non productive. No F/C, no emesis. Some chest tightness no back pain. Has never required intubation or admit for asthma.  Past Medical History  Diagnosis Date  . Asthma   . Obesity   . Seasonal allergies     Past Surgical History  Procedure Date  . Tubal ligation   . Cesarean section   . Forehead reconstruction     as a child    History reviewed. No pertinent family history.  History  Substance Use Topics  . Smoking status: Former Smoker    Types: Cigarettes    Quit date: 08/12/2011  . Smokeless tobacco: Not on file  . Alcohol Use: 0.0 oz/week     occasion    OB History    Grav Para Term Preterm Abortions TAB SAB Ect Mult Living                  Review of Systems  Constitutional: Negative for fever and chills.  HENT: Negative for neck pain and neck stiffness.   Eyes: Negative for pain.  Respiratory: Positive for cough, chest tightness and shortness of breath.   Cardiovascular: Negative for palpitations and leg swelling.  Gastrointestinal: Negative for abdominal pain.  Genitourinary: Negative for dysuria.  Musculoskeletal: Negative for back pain.  Skin: Negative for rash.  Neurological: Negative for headaches.  All other systems reviewed and are negative.    Allergies  Ibuprofen and Azithromycin  Home Medications   Current Outpatient Rx  Name Route Sig Dispense Refill  . ACETAMINOPHEN 325 MG PO TABS Oral Take  650 mg by mouth every 6 (six) hours as needed. For pain    . ALBUTEROL SULFATE HFA 108 (90 BASE) MCG/ACT IN AERS Inhalation Inhale 1-2 puffs into the lungs every 6 (six) hours as needed for wheezing. 1 Inhaler 2  . METRONIDAZOLE 500 MG PO TABS Oral Take 1 tablet (500 mg total) by mouth 3 (three) times daily with meals. First dose is dinner dose today (9/30). Last dose on 10/12. 37 tablet 0  . SULFAMETHOXAZOLE-TRIMETHOPRIM 800-160 MG PO TABS Oral Take 1 tablet by mouth 2 (two) times daily. First dose is evening dose today (9/30). Last dose on 10/12. 25 tablet 0    BP 149/77  Pulse 105  Resp 26  SpO2 99%  LMP 04/02/2012  Physical Exam  Constitutional: She is oriented to person, place, and time. She appears well-developed and well-nourished.  HENT:  Head: Normocephalic and atraumatic.  Right Ear: External ear normal.  Left Ear: External ear normal.  Mouth/Throat: No oropharyngeal exudate.  Eyes: Conjunctivae normal and EOM are normal. Pupils are equal, round, and reactive to light.  Neck: Full passive range of motion without pain. Neck supple. No thyromegaly present.  Cardiovascular: Normal rate, regular rhythm, S1 normal, S2 normal and intact distal pulses.   Pulmonary/Chest:       Tachypnea with insp/ exp wheezes, mildly dec breath sounds.   Abdominal: Soft. Bowel sounds are normal. She exhibits  no distension. There is no tenderness. There is no CVA tenderness.  Musculoskeletal: Normal range of motion.  Neurological: She is alert and oriented to person, place, and time. She has normal strength and normal reflexes. No cranial nerve deficit or sensory deficit. She displays a negative Romberg sign. GCS eye subscore is 4. GCS verbal subscore is 5. GCS motor subscore is 6.       Normal Gait  Skin: Skin is warm and dry. No rash noted. No cyanosis. Nails show no clubbing.  Psychiatric: She has a normal mood and affect. Her speech is normal and behavior is normal.    ED Course  Procedures  (including critical care time)  Labs Reviewed - No data to display Dg Chest [facility] 1 View  04/17/2012  *RADIOLOGY REPORT*  Clinical Data: 41 year old female shortness of breath and cough.  PORTABLE CHEST - 1 VIEW  Comparison: 04/08/2012 and earlier.  Findings: Chronic blunting of the right costophrenic angle and hypoventilation in that region.  Increased streaky opacity at the lung bases, greater on the right.  Cardiac size and mediastinal contours are within normal limits.  Visualized tracheal air column is within normal limits.  No pneumothorax or edema.  No pleural effusion or other acute pulmonary opacity.  IMPRESSION: Chronic changes at the right costophrenic angle with mild superimposed bibasilar atelectasis.   Original Report Authenticated By: Harley Hallmark, M.D.    Albuterol neb and steroids provided.   Recheck after one treatment still wheezing, subjectively no improvement. Second breathing treatment ordered and placed on asthma OBSERVATION protocol.   CXR obtained/ reviewed as above.   MDM   Acute asthma exacerbation. Serial evaluation and breathing treatments. CXR. medications as above.         Sunnie Nielsen, MD 04/17/12 [phone]

## 2012-04-17 NOTE — ED Provider Notes (Signed)
8:59 AM Assumed care of patient in the CDU.  Patient with a history of asthma currently on the wheeze protocol. No prior hospitalizations or intubations for asthma.  Patient presented this morning with a chief complaint of SOB.  She was given two Albuterol neb treatments by EMS prior to arrival and was given two treatments while in the ED in addition to IV steroids and IV magnesium.  Reassessed patient.  She states that she continues to feel SOB, but her shortness of breath has improved.   Patient alert and orientated x 3 Heart:  RRR Lungs:  Diffuse expiratory wheezing.  Patient able to speak in full sentences.  No accessory muscle use.  Will order continuous neb treatment and reassess.  11:11 AM Reassessed patient.  Patient states that her shortness of breath has continued to improve.  Lungs sound much better than before the continuous neb treatment.  Patient continues to have diffuse expiratory wheezing, but wheezing mild at this time.  Will reassess shortly.  12:24 PM Reassessed patient.  Lungs CTAB.  Will check pulse ox while ambulating.  3:04 PM Patient ambulated and maintained pulse ox 99 while ambulating.  Will discharge patient home with 5 day course of Prednisone and Albuterol inhaler.  Patient has follow up appointment scheduled with Dr. Concepcion Elk this week.  Return precautions discussed with patient.    Pascal Lux [place], PA-C 04/17/12 1704

## 2012-04-22 ENCOUNTER — Encounter (HOSPITAL_COMMUNITY): Payer: Self-pay | Admitting: *Deleted

## 2012-04-22 ENCOUNTER — Emergency Department (HOSPITAL_COMMUNITY)
Admission: EM | Admit: 2012-04-22 | Discharge: 2012-04-22 | Disposition: A | Discharge status: Home / Self Care | Payer: Medicaid Other | Attending: Emergency Medicine | Admitting: Emergency Medicine

## 2012-04-22 ENCOUNTER — Other Ambulatory Visit: Payer: Self-pay

## 2012-04-22 ENCOUNTER — Emergency Department (HOSPITAL_COMMUNITY): Payer: Medicaid Other

## 2012-04-22 DIAGNOSIS — R000 Tachycardia, unspecified: Secondary | ICD-10-CM | POA: Insufficient documentation

## 2012-04-22 DIAGNOSIS — R079 Chest pain, unspecified: Secondary | ICD-10-CM | POA: Insufficient documentation

## 2012-04-22 DIAGNOSIS — J45901 Unspecified asthma with (acute) exacerbation: Secondary | ICD-10-CM

## 2012-04-22 DIAGNOSIS — R0602 Shortness of breath: Secondary | ICD-10-CM | POA: Insufficient documentation

## 2012-04-22 DIAGNOSIS — R059 Cough, unspecified: Secondary | ICD-10-CM | POA: Insufficient documentation

## 2012-04-22 DIAGNOSIS — R05 Cough: Secondary | ICD-10-CM | POA: Insufficient documentation

## 2012-04-22 DIAGNOSIS — R6883 Chills (without fever): Secondary | ICD-10-CM | POA: Insufficient documentation

## 2012-04-22 DIAGNOSIS — Z79899 Other long term (current) drug therapy: Secondary | ICD-10-CM | POA: Insufficient documentation

## 2012-04-22 DIAGNOSIS — R61 Generalized hyperhidrosis: Secondary | ICD-10-CM | POA: Insufficient documentation

## 2012-04-22 MED ORDER — PREDNISONE 50 MG PO TABS: 50.0000 mg | ORAL_TABLET | Freq: Every day | ORAL | Status: DC

## 2012-04-22 MED ORDER — ALBUTEROL SULFATE (5 MG/ML) 0.5% IN NEBU
5.0000 mg | INHALATION_SOLUTION | RESPIRATORY_TRACT | Status: DC
  Filled 2012-04-22: qty 1

## 2012-04-22 MED ORDER — PREDNISONE 20 MG PO TABS
60.0000 mg | ORAL_TABLET | Freq: Once | ORAL | Status: AC
  Administered 2012-04-22: 60 mg via ORAL
  Filled 2012-04-22: qty 3

## 2012-04-22 MED ORDER — ALBUTEROL SULFATE (5 MG/ML) 0.5% IN NEBU
5.0000 mg | INHALATION_SOLUTION | RESPIRATORY_TRACT | Status: AC
  Administered 2012-04-22 (×3): 5 mg via RESPIRATORY_TRACT
  Filled 2012-04-22: qty 1
  Filled 2012-04-22 (×2): qty 0.5
  Filled 2012-04-22: qty 1

## 2012-04-22 MED ORDER — ALBUTEROL (5 MG/ML) CONTINUOUS INHALATION SOLN
15.0000 mg/h | INHALATION_SOLUTION | RESPIRATORY_TRACT | Status: DC
  Administered 2012-04-22: 15 mg/h via RESPIRATORY_TRACT

## 2012-04-22 MED ORDER — IPRATROPIUM BROMIDE 0.02 % IN SOLN
0.5000 mg | Freq: Once | RESPIRATORY_TRACT | Status: AC
  Administered 2012-04-22: 0.5 mg via RESPIRATORY_TRACT
  Filled 2012-04-22: qty 2.5

## 2012-04-22 MED ORDER — ACETAMINOPHEN-CODEINE #3 300-30 MG PO TABS: 1.0000 | ORAL_TABLET | Freq: Once | ORAL | Status: DC

## 2012-04-22 MED ORDER — ALBUTEROL (5 MG/ML) CONTINUOUS INHALATION SOLN
INHALATION_SOLUTION | RESPIRATORY_TRACT | Status: AC
  Administered 2012-04-22: 15 mg/h via RESPIRATORY_TRACT
  Filled 2012-04-22: qty 20

## 2012-04-22 MED ORDER — ALBUTEROL SULFATE HFA 108 (90 BASE) MCG/ACT IN AERS
1.0000 | INHALATION_SPRAY | Freq: Once | RESPIRATORY_TRACT | Status: DC
  Filled 2012-04-22: qty 6.7

## 2012-04-22 MED ORDER — ACETAMINOPHEN-CODEINE #3 300-30 MG PO TABS
1.0000 | ORAL_TABLET | Freq: Once | ORAL | Status: AC
  Administered 2012-04-22: 1 via ORAL
  Filled 2012-04-22: qty 1

## 2012-04-22 NOTE — ED Notes (Signed)
Patient with onset of chills/sweating last night.  She also has sob,  She has hx of asthma.  She reports body aches as well

## 2012-04-22 NOTE — ED Notes (Signed)
Peak Flow 400. Pt states that "it didn't hurt this time"

## 2012-04-22 NOTE — ED Notes (Signed)
Pt. Denies ShOB post-neb treatment. Pt. States she feels wheezing has decreased. Expiratory wheezes heard bilateral lobes.

## 2012-04-22 NOTE — ED Provider Notes (Addendum)
History    This chart was scribed for Derwood Kaplan, MD, MD by Smitty Pluck. The patient was seen in room TR10C and the patient's care was started at 1:40PM.   CSN: [id number]  Arrival date & time 04/22/12  1251   None     Chief Complaint  Patient presents with  . Chills  . Shortness of Breath    (Consider location/radiation/quality/duration/timing/severity/associated sxs/prior treatment) Patient is a 41 y.o. female presenting with shortness of breath. The history is provided by the patient. No language interpreter was used.  Shortness of Breath  Associated symptoms include chest pain, cough, shortness of breath and wheezing.   Anne Schaefer is a 41 y.o. female with hx of asthma who presents to the Emergency Department complaining of constant, moderate SOB and chest wall pain onset 1 day ago. Pt reports that she has productive cough with clear sputum with brown fluid. Pt reports cough aggravates chest pain. She has taken inhaler 3x today with minor relief. She reports having chills, diaphoresis and wheezing. Denies vomiting, nausea and fever. Pt was admitted to hospital 2 weeks ago and diagnosed with diverticulitis. She is currently taking flagyl and bactrim.  Past Medical History  Diagnosis Date  . Asthma   . Obesity   . Seasonal allergies     Past Surgical History  Procedure Date  . Tubal ligation   . Cesarean section   . Forehead reconstruction     as a child    No family history on file.  History  Substance Use Topics  . Smoking status: Former Smoker    Types: Cigarettes    Quit date: 08/12/2011  . Smokeless tobacco: Not on file  . Alcohol Use: 0.0 oz/week     occasion    OB History    Grav Para Term Preterm Abortions TAB SAB Ect Mult Living                  Review of Systems  Constitutional: Positive for chills and diaphoresis.  Respiratory: Positive for cough, shortness of breath and wheezing.   Cardiovascular: Positive for chest pain.  All other  systems reviewed and are negative.    Allergies  Ibuprofen and Azithromycin  Home Medications   Current Outpatient Rx  Name Route Sig Dispense Refill  . ACETAMINOPHEN 325 MG PO TABS Oral Take 650 mg by mouth every 6 (six) hours as needed. For pain    . ALBUTEROL SULFATE HFA 108 (90 BASE) MCG/ACT IN AERS Inhalation Inhale 1-2 puffs into the lungs every 6 (six) hours as needed for wheezing. 1 Inhaler 2  . ALBUTEROL SULFATE HFA 108 (90 BASE) MCG/ACT IN AERS Inhalation Inhale 1-2 puffs into the lungs every 6 (six) hours as needed for wheezing. 1 Inhaler 0  . METRONIDAZOLE 500 MG PO TABS Oral Take 1 tablet (500 mg total) by mouth 3 (three) times daily with meals. First dose is dinner dose today (9/30). Last dose on 10/12. 37 tablet 0  . PREDNISONE 20 MG PO TABS Oral Take 3 tablets (60 mg total) by mouth daily. 15 tablet 0  . SULFAMETHOXAZOLE-TRIMETHOPRIM 800-160 MG PO TABS Oral Take 1 tablet by mouth 2 (two) times daily. First dose is evening dose today (9/30). Last dose on 10/12. 25 tablet 0    BP 140/99  Pulse 118  Temp 98.6 F (37 C) (Oral)  Resp 22  Ht 5\' 2"  (1.575 m)  Wt 250 lb (113.399 kg)  BMI 45.73 kg/m2  SpO2 96%  LMP 04/02/2012  Physical Exam  Nursing note and vitals reviewed. Constitutional: She is oriented to person, place, and time. She appears well-developed and well-nourished. No distress.  HENT:  Head: Normocephalic and atraumatic.  Cardiovascular: Regular rhythm and normal heart sounds.  Tachycardia present.   Pulmonary/Chest: No accessory muscle usage. She has wheezes.       Expiration worse than inspiration No retractions   Abdominal: Soft. There is no tenderness.  Musculoskeletal: Normal range of motion.       No unilateral calf swelling or tenderness   Neurological: She is alert and oriented to person, place, and time.  Skin: Skin is warm and dry.  Psychiatric: She has a normal mood and affect. Her behavior is normal.    ED Course  Procedures  (including critical care time) DIAGNOSTIC STUDIES: Oxygen Saturation is 96% on room air, normal by my interpretation.    COORDINATION OF CARE: 1:46 PM Discussed ED treatment with pt  1:47 PM    . albuterol  5 mg Nebulization Q20 Min  . ipratropium  0.5 mg Nebulization Once       Labs Reviewed - No data to display No results found.   No diagnosis found.    MDM  DDX includes: Viral syndrome Sinusitis Asthma exacerbation COPD exacerbation Pneumonia PE  Pt comes in with pleuritic chest pain, cough, wheezing. She has hx of asthma, and has diffuse wheezing. She has been taking inhalers at home, 3 treatments so far. No hx of PE, no risk factors for the same. Initial impression is that she has asthma exacerbation. Will treat and reassess. Asthma hx is benign, with no intubations.   Derwood Kaplan, MD 04/22/12 1357   Date: 04/22/2012  Rate: 100  Rhythm: normal sinus rhythm  QRS Axis: normal  Intervals: normal  ST/T Wave abnormalities: non specific t wave abnormalities  Conduction Disutrbances: none  Narrative Interpretation: unremarkable  Pt is now s/p 15 mg albuterol and atrovent. The HT is now in the 90s and the RR in the 14. Lung exam - much improved, still has some wheezing. Will give 1 more treatment. Likely discharge.     Derwood Kaplan, MD 04/22/12 1718  Wheezing resolved, will discharge.  Derwood Kaplan, MD 04/22/12 1934

## 2012-04-29 NOTE — ED Provider Notes (Signed)
Medical screening examination/treatment/procedure(s) were performed by non-physician practitioner and as supervising physician I was immediately available for consultation/collaboration.  Cheri Guppy, MD 04/29/12 1525

## 2012-06-20 ENCOUNTER — Emergency Department (HOSPITAL_COMMUNITY)
Admission: EM | Admit: 2012-06-20 | Discharge: 2012-06-20 | Disposition: A | Discharge status: Home / Self Care | Payer: Medicaid Other | Attending: Emergency Medicine | Admitting: Emergency Medicine

## 2012-06-20 ENCOUNTER — Emergency Department (HOSPITAL_COMMUNITY): Payer: Medicaid Other

## 2012-06-20 DIAGNOSIS — Z87891 Personal history of nicotine dependence: Secondary | ICD-10-CM | POA: Insufficient documentation

## 2012-06-20 DIAGNOSIS — J3489 Other specified disorders of nose and nasal sinuses: Secondary | ICD-10-CM | POA: Insufficient documentation

## 2012-06-20 DIAGNOSIS — E669 Obesity, unspecified: Secondary | ICD-10-CM | POA: Insufficient documentation

## 2012-06-20 DIAGNOSIS — F1210 Cannabis abuse, uncomplicated: Secondary | ICD-10-CM | POA: Insufficient documentation

## 2012-06-20 DIAGNOSIS — R062 Wheezing: Secondary | ICD-10-CM

## 2012-06-20 DIAGNOSIS — R0602 Shortness of breath: Secondary | ICD-10-CM | POA: Insufficient documentation

## 2012-06-20 DIAGNOSIS — J45909 Unspecified asthma, uncomplicated: Secondary | ICD-10-CM | POA: Insufficient documentation

## 2012-06-20 MED ORDER — ALBUTEROL SULFATE HFA 108 (90 BASE) MCG/ACT IN AERS: 2.0000 | INHALATION_SPRAY | RESPIRATORY_TRACT | Status: DC | PRN

## 2012-06-20 MED ORDER — ALBUTEROL SULFATE (5 MG/ML) 0.5% IN NEBU: 2.5000 mg | INHALATION_SOLUTION | Freq: Once | RESPIRATORY_TRACT | Status: DC

## 2012-06-20 MED ORDER — PREDNISONE 20 MG PO TABS
60.0000 mg | ORAL_TABLET | Freq: Once | ORAL | Status: AC
  Administered 2012-06-20: 60 mg via ORAL
  Filled 2012-06-20: qty 3

## 2012-06-20 MED ORDER — ALBUTEROL SULFATE (5 MG/ML) 0.5% IN NEBU
5.0000 mg | INHALATION_SOLUTION | Freq: Once | RESPIRATORY_TRACT | Status: AC
  Administered 2012-06-20: 5 mg via RESPIRATORY_TRACT
  Filled 2012-06-20: qty 1

## 2012-06-20 MED ORDER — ALBUTEROL SULFATE (5 MG/ML) 0.5% IN NEBU
2.5000 mg | INHALATION_SOLUTION | Freq: Once | RESPIRATORY_TRACT | Status: AC
  Administered 2012-06-20: 2.5 mg via RESPIRATORY_TRACT
  Filled 2012-06-20 (×2): qty 0.5

## 2012-06-20 MED ORDER — PREDNISONE 20 MG PO TABS: 40.0000 mg | ORAL_TABLET | Freq: Every day | ORAL | Status: DC

## 2012-06-20 NOTE — ED Notes (Signed)
Per EMS: Per pt awoke with 8/10 chest tightness that radiates to back. Pt hx asthma. Pt reports SOB; out of albuterol x 1 month. Wheezing noted throughout. 125 mg solumedrol, 324 aspirin, 2 nitro with no relief. 1 breathing tx en route. 100% RA. 150/70. 90 bpm. 22 RR.

## 2012-06-20 NOTE — ED Notes (Signed)
Pt reports waking up this AM and feeling "like I couldn't take a deep breath." Reports walking outside, which made it worse. Reports 8/10 chest tightness throughout center of chest that is worse when "I take a deep breath." Pt reports similar episodes like this in past due to asthma. Pt reports being out of albuterol at home due to finances.  NAD. VSS.

## 2012-06-20 NOTE — ED Notes (Signed)
PA at bedside.

## 2012-06-20 NOTE — ED Provider Notes (Signed)
History     CSN: [id number]  Arrival date & time 06/20/12  1402   First MD Initiated Contact with Patient 06/20/12 1422      Chief Complaint  Patient presents with  . Shortness of Breath  . Chest Pain    (Consider location/radiation/quality/duration/timing/severity/associated sxs/prior treatment) HPI Comments: This is a 41 year old female, who presents emergency department with chief complaint of chest pain and shortness of breath. Patient has known history of asthma. Patient states that she's been feeling congested for the past week, she went out for a walk this morning to get some fresh air, when she noticed her chest was tightening and was becoming harder to breathe. She is out of her albuterol, which she normally takes to manage her asthma. This feels like her typical asthma exacerbation. Patient's discomfort is 8/10. She denies headache, nausea, vomiting, diarrhea, constipation, numbness or tingling of the extremities.   The history is provided by the patient. No language interpreter was used.    Past Medical History  Diagnosis Date  . Asthma   . Obesity   . Seasonal allergies     Past Surgical History  Procedure Date  . Tubal ligation   . Cesarean section   . Forehead reconstruction     as a child    No family history on file.  History  Substance Use Topics  . Smoking status: Former Smoker    Types: Cigarettes    Quit date: 08/12/2011  . Smokeless tobacco: Not on file  . Alcohol Use: 0.0 oz/week     Comment: occasion    OB History    Grav Para Term Preterm Abortions TAB SAB Ect Mult Living                  Review of Systems  All other systems reviewed and are negative.    Allergies  Ibuprofen and Azithromycin  Home Medications   Current Outpatient Rx  Name  Route  Sig  Dispense  Refill  . ALBUTEROL SULFATE HFA 108 (90 BASE) MCG/ACT IN AERS   Inhalation   Inhale 1-2 puffs into the lungs every 6 (six) hours as needed for wheezing.   1  Inhaler   0     BP 138/71  Pulse 83  Temp 98.8 F (37.1 C) (Oral)  Resp 20  SpO2 100%  Physical Exam  Nursing note and vitals reviewed. Constitutional: She is oriented to person, place, and time. She appears well-developed and well-nourished.  HENT:  Head: Normocephalic and atraumatic.  Eyes: Conjunctivae normal and EOM are normal. Pupils are equal, round, and reactive to light.  Neck: Normal range of motion. Neck supple.  Cardiovascular: Normal rate and regular rhythm.  Exam reveals no gallop and no friction rub.   No murmur heard. Pulmonary/Chest: Effort normal. No respiratory distress. She has wheezes. She has no rales. She exhibits no tenderness.       Diffuse wheezes heard throughout all lung fields.  Abdominal: Soft. Bowel sounds are normal. She exhibits no distension and no mass. There is no tenderness. There is no rebound and no guarding.  Musculoskeletal: Normal range of motion. She exhibits no edema and no tenderness.  Neurological: She is alert and oriented to person, place, and time.  Skin: Skin is warm and dry.  Psychiatric: She has a normal mood and affect. Her behavior is normal. Judgment and thought content normal.    ED Course  Procedures (including critical care time)  Dg Chest 2 View  06/20/2012  *  RADIOLOGY REPORT*  Clinical Data: Shortness of breath, chest pain, cough, history asthma  CHEST - 2 VIEW  Comparison: 04/22/2012  Findings: Upper normal heart size. Slight pulmonary vascular congestion. Mediastinal contours normal. Minimal chronic peribronchial thickening. Mild atelectasis versus scarring at right base. Remaining lungs clear. No definite pleural effusion or pneumothorax. Bones unremarkable.  IMPRESSION: Minimal chronic peribronchial thickening. Mild scarring versus atelectasis at right base.   Original Report Authenticated By: Ulyses Southward, M.D.       1. Wheezing       MDM  41 year old female with chest pain, shortness of breath, and wheezing.  Patient has received nebulizer treatments from the EMS, she reports that this helped a little. She still sounds very wheezy. Will give the patient another nebulizer treatment in order chest x-ray.  4:02 PM Patient has received 2 breathing treatments while in the ED. She is very much improved. Her chest pain resolved with the breathing treatments. She no longer has any wheezing. I'm going to treat the patient with prednisone and an inhaler. I will have her followup with her primary care. Specific return precautions have been given. Patient is stable and ready for discharge. She understands and agrees with the plan.       Roxy Horseman, PA-C 06/20/12 [phone]

## 2012-06-22 NOTE — ED Provider Notes (Signed)
Medical screening examination/treatment/procedure(s) were performed by non-physician practitioner and as supervising physician I was immediately available for consultation/collaboration.   Celene Kras, MD 06/22/12 [phone]

## 2012-07-07 ENCOUNTER — Other Ambulatory Visit: Payer: Self-pay

## 2012-07-07 ENCOUNTER — Emergency Department (HOSPITAL_COMMUNITY)
Admission: EM | Admit: 2012-07-07 | Discharge: 2012-07-07 | Disposition: A | Discharge status: Home / Self Care | Payer: Medicaid Other | Attending: Emergency Medicine | Admitting: Emergency Medicine

## 2012-07-07 ENCOUNTER — Encounter (HOSPITAL_COMMUNITY): Payer: Self-pay | Admitting: Nurse Practitioner

## 2012-07-07 ENCOUNTER — Emergency Department (HOSPITAL_COMMUNITY): Payer: Medicaid Other

## 2012-07-07 DIAGNOSIS — R0989 Other specified symptoms and signs involving the circulatory and respiratory systems: Secondary | ICD-10-CM | POA: Insufficient documentation

## 2012-07-07 DIAGNOSIS — R071 Chest pain on breathing: Secondary | ICD-10-CM | POA: Insufficient documentation

## 2012-07-07 DIAGNOSIS — J45909 Unspecified asthma, uncomplicated: Secondary | ICD-10-CM

## 2012-07-07 DIAGNOSIS — J45901 Unspecified asthma with (acute) exacerbation: Secondary | ICD-10-CM | POA: Insufficient documentation

## 2012-07-07 DIAGNOSIS — R05 Cough: Secondary | ICD-10-CM | POA: Insufficient documentation

## 2012-07-07 DIAGNOSIS — R0789 Other chest pain: Secondary | ICD-10-CM

## 2012-07-07 DIAGNOSIS — Z87891 Personal history of nicotine dependence: Secondary | ICD-10-CM | POA: Insufficient documentation

## 2012-07-07 DIAGNOSIS — E669 Obesity, unspecified: Secondary | ICD-10-CM | POA: Insufficient documentation

## 2012-07-07 DIAGNOSIS — R0609 Other forms of dyspnea: Secondary | ICD-10-CM | POA: Insufficient documentation

## 2012-07-07 DIAGNOSIS — Z79899 Other long term (current) drug therapy: Secondary | ICD-10-CM | POA: Insufficient documentation

## 2012-07-07 DIAGNOSIS — R059 Cough, unspecified: Secondary | ICD-10-CM | POA: Insufficient documentation

## 2012-07-07 MED ORDER — IPRATROPIUM BROMIDE 0.02 % IN SOLN
RESPIRATORY_TRACT | Status: AC
  Administered 2012-07-07: 0.5 mg via RESPIRATORY_TRACT
  Filled 2012-07-07: qty 2.5

## 2012-07-07 MED ORDER — ALBUTEROL (5 MG/ML) CONTINUOUS INHALATION SOLN
2.5000 mg/h | INHALATION_SOLUTION | Freq: Once | RESPIRATORY_TRACT | Status: AC
  Administered 2012-07-07: 12.5 mg/h via RESPIRATORY_TRACT

## 2012-07-07 MED ORDER — HYDROCODONE-HOMATROPINE 5-1.5 MG/5ML PO SYRP: 5.0000 mL | ORAL_SOLUTION | Freq: Four times a day (QID) | ORAL | Status: DC | PRN

## 2012-07-07 MED ORDER — DEXAMETHASONE SODIUM PHOSPHATE 10 MG/ML IJ SOLN
10.0000 mg | Freq: Once | INTRAMUSCULAR | Status: AC
  Administered 2012-07-07: 10 mg via INTRAVENOUS
  Filled 2012-07-07: qty 1

## 2012-07-07 MED ORDER — ALBUTEROL (5 MG/ML) CONTINUOUS INHALATION SOLN
INHALATION_SOLUTION | RESPIRATORY_TRACT | Status: AC
  Administered 2012-07-07: 12.5 mg/h via RESPIRATORY_TRACT
  Filled 2012-07-07: qty 20

## 2012-07-07 MED ORDER — IPRATROPIUM BROMIDE 0.02 % IN SOLN
0.5000 mg | Freq: Once | RESPIRATORY_TRACT | Status: AC
  Administered 2012-07-07: 0.5 mg via RESPIRATORY_TRACT

## 2012-07-07 NOTE — ED Notes (Signed)
Pt amb back to waiting area without problem.  Pt states she coughed and is feeling a little better

## 2012-07-07 NOTE — ED Notes (Signed)
Patient states she would like to have another breathing treatment

## 2012-07-07 NOTE — ED Notes (Signed)
Pt to triage office for [facility] of albuterol and atrovent. Pt anxious, states she is tired of waiting. Explained delay to pt. Pt O2 sat is 100 % on room air. Pt has scattered wheezes throughout lung fields

## 2012-07-07 NOTE — ED Notes (Signed)
Per ems: pt called for SOB since yesterday, increased use of inhaler at home with no relief of SOB. On arrival ems found pt with wheezing all lung fields, administered 5 mg albuterol and now wheezing remains only in RU lobe. o2 sats 100% on arrival to scene and after treatment. VSS, A&Ox4

## 2012-07-07 NOTE — ED Provider Notes (Addendum)
History     CSN: [id number]  Arrival date & time 07/07/12  6962   First MD Initiated Contact with Patient 07/07/12 2312      Chief Complaint  Patient presents with  . Shortness of Breath    (Consider location/radiation/quality/duration/timing/severity/associated sxs/prior treatment) HPI This is a 41 year old female with a history of asthma. She complains of worsening shortness of breath beginning this morning not adequately relieved by her albuterol inhaler. It was accompanied by cough and anterior chest soreness. Symptoms were moderate to severe with associated wheezing. She was given an albuterol nebulizer treatment on the ambulance ride here. She was given another albuterol treatment on arrival. She states her dyspnea is now resolved and she no longer hears wheezing. She does continue to have chest soreness. She denies fever, sore throat, nausea, vomiting or diarrhea. She has had some rhinorrhea.  Past Medical History  Diagnosis Date  . Asthma   . Obesity   . Seasonal allergies     Past Surgical History  Procedure Date  . Tubal ligation   . Cesarean section   . Forehead reconstruction     as a child    History reviewed. No pertinent family history.  History  Substance Use Topics  . Smoking status: Former Smoker    Types: Cigarettes    Quit date: 08/12/2011  . Smokeless tobacco: Not on file  . Alcohol Use: 0.0 oz/week     Comment: occasion    OB History    Grav Para Term Preterm Abortions TAB SAB Ect Mult Living                  Review of Systems  All other systems reviewed and are negative.    Allergies  Ibuprofen and Azithromycin  Home Medications   Current Outpatient Rx  Name  Route  Sig  Dispense  Refill  . ALBUTEROL SULFATE HFA 108 (90 BASE) MCG/ACT IN AERS   Inhalation   Inhale 1-2 puffs into the lungs every 6 (six) hours as needed for wheezing.   1 Inhaler   0     BP 149/70  Pulse 100  Temp 97.8 F (36.6 C)  Resp 18  SpO2 100%   LMP 07/01/2012  Physical Exam General: Well-developed, well-nourished female in no acute distress; appearance consistent with age of record HENT: normocephalic, atraumatic Eyes: pupils equal round and reactive to light; extraocular muscles intact Neck: supple Heart: regular rate and rhythm Lungs: clear to auscultation bilaterally except faint expiratory wheezing left upper lobe Chest: Anterior chest wall tenderness without deformity or crepitus Abdomen: soft; nondistended; nontender; bowel sounds present Extremities: No deformity; full range of motion Neurologic: Awake, alert and oriented; motor function intact in all extremities and symmetric; no facial droop Skin: Warm and dry Psychiatric: Normal mood and affect    ED Course  Procedures (including critical care time)     MDM  Nursing notes and vitals signs, including pulse oximetry, reviewed.  Summary of this visit's results, reviewed by myself:  Imaging Studies: Dg Chest 2 View  07/07/2012  *RADIOLOGY REPORT*  Clinical Data: Short of breath  CHEST - 2 VIEW  Comparison: Chest radiograph 06/20/2012  Findings: Normal heart silhouette.  There is chronic scarring at the right lateral lung base.  There is no focal consolidation.  No pleural fluid.  Upper lungs are clear.  Airways are normal.  No aggressive osseous lesions.  IMPRESSION:  Chronic scarring and bronchial thickening at the right lung base. No acute findings.  Original Report Authenticated By: Genevive Bi, M.D.             Hanley Seamen, MD 07/07/12 1610  Hanley Seamen, MD 07/07/12 9604  Hanley Seamen, MD 07/07/12 2322

## 2012-07-07 NOTE — ED Notes (Signed)
Pt. Reports flare-up in asthma. Expiratory wheezing noted bilaterally. Pt. States "I feel like I';m breathing a lot better since the last treatment I received". Pt. Also reports muscle pain in chest from coughing.

## 2012-07-10 ENCOUNTER — Emergency Department (HOSPITAL_COMMUNITY)
Admission: EM | Admit: 2012-07-10 | Discharge: 2012-07-10 | Disposition: A | Discharge status: Home / Self Care | Payer: Medicaid Other | Attending: Emergency Medicine | Admitting: Emergency Medicine

## 2012-07-10 ENCOUNTER — Encounter (HOSPITAL_COMMUNITY): Payer: Self-pay | Admitting: *Deleted

## 2012-07-10 DIAGNOSIS — L02419 Cutaneous abscess of limb, unspecified: Secondary | ICD-10-CM

## 2012-07-10 DIAGNOSIS — M25419 Effusion, unspecified shoulder: Secondary | ICD-10-CM | POA: Insufficient documentation

## 2012-07-10 DIAGNOSIS — J45909 Unspecified asthma, uncomplicated: Secondary | ICD-10-CM | POA: Insufficient documentation

## 2012-07-10 DIAGNOSIS — Z87891 Personal history of nicotine dependence: Secondary | ICD-10-CM | POA: Insufficient documentation

## 2012-07-10 DIAGNOSIS — R000 Tachycardia, unspecified: Secondary | ICD-10-CM | POA: Insufficient documentation

## 2012-07-10 DIAGNOSIS — J309 Allergic rhinitis, unspecified: Secondary | ICD-10-CM | POA: Insufficient documentation

## 2012-07-10 DIAGNOSIS — IMO0002 Reserved for concepts with insufficient information to code with codable children: Secondary | ICD-10-CM | POA: Insufficient documentation

## 2012-07-10 DIAGNOSIS — E669 Obesity, unspecified: Secondary | ICD-10-CM | POA: Insufficient documentation

## 2012-07-10 MED ORDER — CLINDAMYCIN HCL 300 MG PO CAPS
300.0000 mg | ORAL_CAPSULE | Freq: Once | ORAL | Status: AC
  Administered 2012-07-10: 300 mg via ORAL
  Filled 2012-07-10: qty 1

## 2012-07-10 MED ORDER — OXYCODONE-ACETAMINOPHEN 5-325 MG PO TABS: 2.0000 | ORAL_TABLET | ORAL | Status: DC | PRN

## 2012-07-10 MED ORDER — OXYCODONE-ACETAMINOPHEN 5-325 MG PO TABS
1.0000 | ORAL_TABLET | Freq: Once | ORAL | Status: AC
  Administered 2012-07-10: 1 via ORAL
  Filled 2012-07-10: qty 1

## 2012-07-10 MED ORDER — CLINDAMYCIN HCL 300 MG PO CAPS: 300.0000 mg | ORAL_CAPSULE | Freq: Four times a day (QID) | ORAL | Status: DC

## 2012-07-10 NOTE — ED Notes (Signed)
Pt states she has a boil under L arm, states "it burst yesterday but I just went to bed and this morning it has my whole L side hurting". Pt does have abscess under L arm, swollen, reddened area noted. Pt states she also has a headache.

## 2012-07-10 NOTE — ED Provider Notes (Addendum)
History     CSN: [id number]  Arrival date & time 07/10/12  8657   First MD Initiated Contact with Patient 07/10/12 [phone]      No chief complaint on file.   (Consider location/radiation/quality/duration/timing/severity/associated sxs/prior treatment) The history is provided by the patient.  Anne Schaefer is a 41 y.o. female hx of C section, several boils in the past but no hx MRSA here with bowel under L arm pit. Swelling for the last 3 days, + purulent drainage since yesterday. + fever 101 at home. No cough or ab pain, or vomiting. No hx of MRSA. Not diabetic.   Past Medical History  Diagnosis Date  . Asthma   . Obesity   . Seasonal allergies     Past Surgical History  Procedure Date  . Tubal ligation   . Cesarean section   . Forehead reconstruction     as a child    No family history on file.  History  Substance Use Topics  . Smoking status: Former Smoker    Types: Cigarettes    Quit date: 08/12/2011  . Smokeless tobacco: Not on file  . Alcohol Use: 0.0 oz/week     Comment: occasion    OB History    Grav Para Term Preterm Abortions TAB SAB Ect Mult Living                  Review of Systems  Skin: Positive for rash.  All other systems reviewed and are negative.    Allergies  Ibuprofen and Azithromycin  Home Medications   Current Outpatient Rx  Name  Route  Sig  Dispense  Refill  . ACETAMINOPHEN 500 MG PO TABS   Oral   Take 1,000 mg by mouth every 6 (six) hours as needed. For pain         . ALBUTEROL SULFATE HFA 108 (90 BASE) MCG/ACT IN AERS   Inhalation   Inhale 2 puffs into the lungs every 6 (six) hours as needed. For shortness of breath         . HYDROCODONE-HOMATROPINE 5-1.5 MG/5ML PO SYRP   Oral   Take 5 mLs by mouth every 6 (six) hours as needed for cough or pain.   120 mL   0     BP 147/98  Pulse 114  Temp 101.3 F (38.5 C) (Oral)  Resp 17  SpO2 97%  LMP 07/01/2012  Physical Exam  Nursing note and vitals  reviewed. Constitutional: She is oriented to person, place, and time. She appears well-developed and well-nourished.  HENT:  Head: Normocephalic.  Mouth/Throat: Oropharynx is clear and moist.  Eyes: Conjunctivae normal are normal. Pupils are equal, round, and reactive to light.  Neck: Normal range of motion. Neck supple.  Cardiovascular:       Tachycardic   Pulmonary/Chest: Effort normal.  Abdominal: Soft.  Neurological: She is alert and oriented to person, place, and time.  Skin: Skin is warm and dry.          L axilla with large area of cellulitis with + purulent drainage and some fluctuance. No subcutaneous air.   Psychiatric: She has a normal mood and affect. Her behavior is normal. Judgment and thought content normal.    ED Course  Procedures (including critical care time)  INCISION AND DRAINAGE Performed by: Silverio Lay, DAVID Consent: Verbal consent obtained. Risks and benefits: risks, benefits and alternatives were discussed Type: abscess  Body area: L axilla  Anesthesia: local infiltration  Incision was made  with a scalpel.  Local anesthetic: lidocaine 2% no epinephrine  Anesthetic total: 15 ml  Complexity: complex Blunt dissection to break up loculations  Drainage: purulent  Drainage amount: 15 cc   Packing material: 1/4 in iodoform gauze  Patient tolerance: Patient tolerated the procedure well with no immediate complications.     Labs Reviewed - No data to display No results found.   No diagnosis found.    MDM  Anne Schaefer is a 41 y.o. female here with L axillary abscess with surrounding cellulitis. I &D performed as above. I packed the wound because the wound is quite deep. I will d/c her home with pain meds and a course of clinda due to large area of cellulitis and patient febrile. Return in 2 days for wound check.          Richardean Canal, MD 07/10/12 [phone]  Richardean Canal, MD 07/10/12 [phone]

## 2012-07-13 ENCOUNTER — Emergency Department (HOSPITAL_COMMUNITY)
Admission: EM | Admit: 2012-07-13 | Discharge: 2012-07-13 | Disposition: A | Discharge status: Home / Self Care | Payer: Medicaid Other | Attending: Emergency Medicine | Admitting: Emergency Medicine

## 2012-07-13 ENCOUNTER — Encounter (HOSPITAL_COMMUNITY): Payer: Self-pay | Admitting: Emergency Medicine

## 2012-07-13 DIAGNOSIS — Z5189 Encounter for other specified aftercare: Secondary | ICD-10-CM

## 2012-07-13 DIAGNOSIS — E669 Obesity, unspecified: Secondary | ICD-10-CM | POA: Insufficient documentation

## 2012-07-13 DIAGNOSIS — Z79899 Other long term (current) drug therapy: Secondary | ICD-10-CM | POA: Insufficient documentation

## 2012-07-13 DIAGNOSIS — Z87891 Personal history of nicotine dependence: Secondary | ICD-10-CM | POA: Insufficient documentation

## 2012-07-13 DIAGNOSIS — J45909 Unspecified asthma, uncomplicated: Secondary | ICD-10-CM | POA: Insufficient documentation

## 2012-07-13 DIAGNOSIS — Z4801 Encounter for change or removal of surgical wound dressing: Secondary | ICD-10-CM | POA: Insufficient documentation

## 2012-07-13 NOTE — ED Notes (Signed)
Pt presenting to ed with c/o abscess re-check.

## 2012-07-13 NOTE — ED Provider Notes (Signed)
History     CSN: [id number]  Arrival date & time 07/13/12  0801   First MD Initiated Contact with Patient 07/13/12 [phone]      No chief complaint on file.   (Consider location/radiation/quality/duration/timing/severity/associated sxs/prior treatment) HPI Comments: She had an I&D on 12/30 and reports some persistent drainage but reports the fever, redness, and swelling have improved.   Patient is a 42 y.o. female presenting with wound check. The history is provided by the patient. No language interpreter was used.  Wound Check  She was treated in the ED 2 to 3 days ago. Previous treatment in the ED includes I&D of abscess. Treatments since wound repair include oral antibiotics. There has been colored discharge from the wound. The redness has improved. The swelling has improved. The pain has improved. There is difficulty moving the extremity or digit due to pain (but improved.).    Past Medical History  Diagnosis Date  . Asthma   . Obesity   . Seasonal allergies     Past Surgical History  Procedure Date  . Tubal ligation   . Cesarean section   . Forehead reconstruction     as a child    No family history on file.  History  Substance Use Topics  . Smoking status: Former Smoker    Types: Cigarettes    Quit date: 08/12/2011  . Smokeless tobacco: Not on file  . Alcohol Use: 0.0 oz/week     Comment: occasion    OB History    Grav Para Term Preterm Abortions TAB SAB Ect Mult Living                  Review of Systems  All other systems reviewed and are negative.    Allergies  Ibuprofen and Azithromycin  Home Medications   Current Outpatient Rx  Name  Route  Sig  Dispense  Refill  . ACETAMINOPHEN 500 MG PO TABS   Oral   Take 1,000 mg by mouth every 6 (six) hours as needed. For pain         . ALBUTEROL SULFATE HFA 108 (90 BASE) MCG/ACT IN AERS   Inhalation   Inhale 2 puffs into the lungs every 6 (six) hours as needed. For shortness of breath         .  CLINDAMYCIN HCL 300 MG PO CAPS   Oral   Take 1 capsule (300 mg total) by mouth 4 (four) times daily. X 7 days   28 capsule   0   . HYDROCODONE-HOMATROPINE 5-1.5 MG/5ML PO SYRP   Oral   Take 5 mLs by mouth every 6 (six) hours as needed for cough or pain.   120 mL   0   . OXYCODONE-ACETAMINOPHEN 5-325 MG PO TABS   Oral   Take 2 tablets by mouth every 4 (four) hours as needed for pain.   10 tablet   0     BP 149/96  Pulse 110  Temp 98.4 F (36.9 C)  SpO2 100%  LMP 07/01/2012  Physical Exam  Nursing note and vitals reviewed. Constitutional: She is oriented to person, place, and time. She appears well-developed and well-nourished. No distress.       Pt is obese   HENT:  Head: Normocephalic and atraumatic.  Right Ear: External ear normal.  Left Ear: External ear normal.  Nose: Nose normal.  Mouth/Throat: Oropharynx is clear and moist. No oropharyngeal exudate.  Eyes: Conjunctivae normal are normal. Pupils are equal, round, and  reactive to light. Right eye exhibits no discharge. Left eye exhibits no discharge. No scleral icterus.  Neck: Normal range of motion. Neck supple. No JVD present. No tracheal deviation present.  Cardiovascular: Normal rate, regular rhythm, normal heart sounds and intact distal pulses.  Exam reveals no gallop and no friction rub.   No murmur heard. Pulmonary/Chest: Effort normal and breath sounds normal. No stridor.  Abdominal: Soft. Bowel sounds are normal. There is no tenderness. There is no rebound and no guarding.  Musculoskeletal: Normal range of motion. She exhibits tenderness. She exhibits no edema.  Lymphadenopathy:    She has no cervical adenopathy.  Neurological: She is alert and oriented to person, place, and time.  Skin: Skin is warm and dry. No rash noted. She is not diaphoretic. There is erythema. No pallor.          Note area of induration under the left arm.  There is a sinus drainage serosanguinous fluid.  Purulent drainage is noted  on the bandage removed during the exam.  There is some persistent significant tenderness and mild erythema along the most posterior aspect of the swelling.  Psychiatric: She has a normal mood and affect. Her behavior is normal.    ED Course  Procedures (including critical care time)  Labs Reviewed - No data to display No results found.   No diagnosis found.    MDM  Pt presents for evaluation of a left axillary wound.  Performed a bedside ultrasound.  No persistent fluid collection was identified.  Also administered 3 ml of 1% lidocaine after cleaning the skin with betadine.  Using an 18 gauge needle, attempted an aspiration of the area of persistent tenderness with overlying erythema.  No pus encountered.  She tolerated the procedure well.  Encouraged her to continue taking the antibiotic.  Plan discharge home.  Discussed indications for immediate return to the emergency department.  Will refer her for outpt follow-up with the general surgical group.       Tobin Chad, MD 07/13/12 [phone]

## 2012-08-01 ENCOUNTER — Emergency Department (HOSPITAL_COMMUNITY): Payer: Medicaid Other

## 2012-08-01 ENCOUNTER — Emergency Department (HOSPITAL_COMMUNITY)
Admission: EM | Admit: 2012-08-01 | Discharge: 2012-08-01 | Disposition: A | Discharge status: Home / Self Care | Payer: Medicaid Other | Attending: Emergency Medicine | Admitting: Emergency Medicine

## 2012-08-01 ENCOUNTER — Encounter (HOSPITAL_COMMUNITY): Payer: Self-pay | Admitting: Emergency Medicine

## 2012-08-01 DIAGNOSIS — R059 Cough, unspecified: Secondary | ICD-10-CM | POA: Insufficient documentation

## 2012-08-01 DIAGNOSIS — R05 Cough: Secondary | ICD-10-CM | POA: Insufficient documentation

## 2012-08-01 DIAGNOSIS — Z87891 Personal history of nicotine dependence: Secondary | ICD-10-CM | POA: Insufficient documentation

## 2012-08-01 DIAGNOSIS — E669 Obesity, unspecified: Secondary | ICD-10-CM | POA: Insufficient documentation

## 2012-08-01 DIAGNOSIS — J45901 Unspecified asthma with (acute) exacerbation: Secondary | ICD-10-CM | POA: Insufficient documentation

## 2012-08-01 MED ORDER — CYCLOBENZAPRINE HCL 10 MG PO TABS
10.0000 mg | ORAL_TABLET | Freq: Once | ORAL | Status: AC
  Administered 2012-08-01: 10 mg via ORAL
  Filled 2012-08-01: qty 1

## 2012-08-01 MED ORDER — ALBUTEROL (5 MG/ML) CONTINUOUS INHALATION SOLN
10.0000 mg/h | INHALATION_SOLUTION | RESPIRATORY_TRACT | Status: AC
  Administered 2012-08-01: 10 mg/h via RESPIRATORY_TRACT

## 2012-08-01 MED ORDER — PREDNISONE 20 MG PO TABS: 40.0000 mg | ORAL_TABLET | Freq: Every day | ORAL | Status: DC

## 2012-08-01 MED ORDER — IPRATROPIUM BROMIDE 0.02 % IN SOLN
0.5000 mg | Freq: Once | RESPIRATORY_TRACT | Status: AC
  Administered 2012-08-01: 0.5 mg via RESPIRATORY_TRACT
  Filled 2012-08-01: qty 2.5

## 2012-08-01 MED ORDER — CYCLOBENZAPRINE HCL 10 MG PO TABS: 10.0000 mg | ORAL_TABLET | Freq: Two times a day (BID) | ORAL | Status: DC | PRN

## 2012-08-01 NOTE — ED Notes (Signed)
Took pt a sandwich, drink and a piece of cheese

## 2012-08-01 NOTE — ED Provider Notes (Signed)
Medical screening examination/treatment/procedure(s) were performed by non-physician practitioner and as supervising physician I was immediately available for consultation/collaboration.  Shelda Jakes, MD 08/01/12 [phone]

## 2012-08-01 NOTE — ED Provider Notes (Signed)
History     CSN: [id number]  Arrival date & time 08/01/12  1003   First MD Initiated Contact with Patient 08/01/12 1026      Chief Complaint  Patient presents with  . Asthma    (Consider location/radiation/quality/duration/timing/severity/associated sxs/prior treatment) HPI Comments: Patient is a 42 year old female with a past medical history of asthma who presents with a 1 day history of SOB. Symptoms started gradually and progressively worsened since last night. Patient reports an associated productive cough with brown phlegm. Patient has tried her rescue inhaler at home for symptoms which has not helped. Patient reports associated generalized chest tenderness to palpation which she believes is due to continued coughing. No aggravating/alleviating symptoms.   Patient is a 42 y.o. female presenting with asthma.  Asthma    Past Medical History  Diagnosis Date  . Asthma   . Obesity   . Seasonal allergies     Past Surgical History  Procedure Date  . Tubal ligation   . Cesarean section   . Forehead reconstruction     as a child    History reviewed. No pertinent family history.  History  Substance Use Topics  . Smoking status: Former Smoker    Types: Cigarettes    Quit date: 08/12/2011  . Smokeless tobacco: Not on file  . Alcohol Use: 0.0 oz/week     Comment: occasion    OB History    Grav Para Term Preterm Abortions TAB SAB Ect Mult Living                  Review of Systems  Respiratory: Positive for shortness of breath and wheezing.   All other systems reviewed and are negative.    Allergies  Ibuprofen and Azithromycin  Home Medications   Current Outpatient Rx  Name  Route  Sig  Dispense  Refill  . ACETAMINOPHEN 500 MG PO TABS   Oral   Take 1,000 mg by mouth every 6 (six) hours as needed. For pain         . ALBUTEROL SULFATE HFA 108 (90 BASE) MCG/ACT IN AERS   Inhalation   Inhale 2 puffs into the lungs every 6 (six) hours as needed. For  shortness of breath           BP 145/82  Pulse 72  Temp 98.2 F (36.8 C) (Oral)  Resp 18  SpO2 99%  LMP 07/30/2012  Physical Exam  Nursing note and vitals reviewed. Constitutional: She is oriented to person, place, and time. She appears well-developed and well-nourished. No distress.  HENT:  Head: Normocephalic and atraumatic.  Mouth/Throat: Oropharynx is clear and moist. No oropharyngeal exudate.  Eyes: Conjunctivae normal and EOM are normal.  Neck: Normal range of motion. Neck supple.  Cardiovascular: Normal rate and regular rhythm.  Exam reveals no gallop and no friction rub.   No murmur heard. Pulmonary/Chest: Effort normal. She has wheezes. She has no rales. She exhibits tenderness.       Generalized wheezing and rhonchi noted throughout bilateral lung fields. Generalized chest tenderness to palpation.   Abdominal: Soft. She exhibits no distension. There is no tenderness. There is no rebound and no guarding.  Musculoskeletal: Normal range of motion.  Neurological: She is alert and oriented to person, place, and time. Coordination normal.       Speech is goal-oriented. Moves limbs without ataxia.   Skin: Skin is warm and dry.  Psychiatric: She has a normal mood and affect. Her behavior is  normal.    ED Course  Procedures (including critical care time)  Labs Reviewed - No data to display Dg Chest 2 View  08/01/2012  *RADIOLOGY REPORT*  Clinical Data: Asthma  CHEST - 2 VIEW  Comparison:  July 07, 2012  Findings: There is chronic scarring and pleural thickening in the right base.  Lungs are hyperexpanded.  There is no edema or consolidation.  Heart size and pulmonary vascularity are normal.  No adenopathy.  No bone lesions.  IMPRESSION: There may be some underlying emphysema.  Lungs are hyperexpanded.  There is scarring in the right base, stable.  No edema or consolidation.   Original Report Authenticated By: Bretta Bang, M.D.      1. Asthma exacerbation        MDM  11:32 AM Patient will have a continuous albuterol nebulizer treatment with atrovent. Patient receive solumedrol and nebulizer treatment in the ambulance. Xray unremarkable for any acute changes.   12:50 PM Lung sounds have improved since nebulizer treatment. Patient will be discharged with Flexeril for pain and prednisone for asthma exacerbation. Vitals stable for discharge. Patient understands and is agreeable to plan. Patient instructed to return with worsening or concerning symptoms.       Emilia Beck, PA-C 08/01/12 1257

## 2012-08-01 NOTE — ED Notes (Signed)
[id number]<AV> Expected date:<BR> Expected time:<BR> Means of arrival:<BR> Comments:<BR> Shortness of breath wheezing

## 2012-08-01 NOTE — ED Notes (Signed)
H/o asthma, cough since yesterday, used inhalers at home with minimal relief.  Given Duoneb and 125 solu-medrol per EMS, pt c/o muscle soreness from coughing.

## 2012-08-11 ENCOUNTER — Emergency Department (HOSPITAL_COMMUNITY)
Admission: EM | Admit: 2012-08-11 | Discharge: 2012-08-11 | Disposition: A | Discharge status: Home / Self Care | Payer: Medicaid Other | Attending: Emergency Medicine | Admitting: Emergency Medicine

## 2012-08-11 ENCOUNTER — Encounter (HOSPITAL_COMMUNITY): Payer: Self-pay

## 2012-08-11 DIAGNOSIS — J45901 Unspecified asthma with (acute) exacerbation: Secondary | ICD-10-CM | POA: Insufficient documentation

## 2012-08-11 DIAGNOSIS — R05 Cough: Secondary | ICD-10-CM | POA: Insufficient documentation

## 2012-08-11 DIAGNOSIS — R059 Cough, unspecified: Secondary | ICD-10-CM | POA: Insufficient documentation

## 2012-08-11 DIAGNOSIS — R079 Chest pain, unspecified: Secondary | ICD-10-CM | POA: Insufficient documentation

## 2012-08-11 DIAGNOSIS — E669 Obesity, unspecified: Secondary | ICD-10-CM | POA: Insufficient documentation

## 2012-08-11 DIAGNOSIS — J3489 Other specified disorders of nose and nasal sinuses: Secondary | ICD-10-CM | POA: Insufficient documentation

## 2012-08-11 DIAGNOSIS — R0602 Shortness of breath: Secondary | ICD-10-CM | POA: Insufficient documentation

## 2012-08-11 DIAGNOSIS — Z87891 Personal history of nicotine dependence: Secondary | ICD-10-CM | POA: Insufficient documentation

## 2012-08-11 DIAGNOSIS — F1210 Cannabis abuse, uncomplicated: Secondary | ICD-10-CM | POA: Insufficient documentation

## 2012-08-11 DIAGNOSIS — Z79899 Other long term (current) drug therapy: Secondary | ICD-10-CM | POA: Insufficient documentation

## 2012-08-11 MED ORDER — PREDNISONE 20 MG PO TABS: ORAL_TABLET | ORAL | Status: DC

## 2012-08-11 MED ORDER — ALBUTEROL SULFATE HFA 108 (90 BASE) MCG/ACT IN AERS: 2.0000 | INHALATION_SPRAY | RESPIRATORY_TRACT | Status: DC | PRN

## 2012-08-11 MED ORDER — ALBUTEROL SULFATE (5 MG/ML) 0.5% IN NEBU
2.5000 mg | INHALATION_SOLUTION | RESPIRATORY_TRACT | Status: DC
  Administered 2012-08-11: 2.5 mg via RESPIRATORY_TRACT
  Filled 2012-08-11: qty 0.5

## 2012-08-11 MED ORDER — ALBUTEROL SULFATE HFA 108 (90 BASE) MCG/ACT IN AERS
2.0000 | INHALATION_SPRAY | RESPIRATORY_TRACT | Status: DC | PRN
  Filled 2012-08-11: qty 6.7

## 2012-08-11 MED ORDER — ALBUTEROL (5 MG/ML) CONTINUOUS INHALATION SOLN
10.0000 mg/h | INHALATION_SOLUTION | Freq: Once | RESPIRATORY_TRACT | Status: AC
  Administered 2012-08-11: 10 mg/h via RESPIRATORY_TRACT
  Filled 2012-08-11: qty 20

## 2012-08-11 MED ORDER — METHYLPREDNISOLONE SODIUM SUCC 125 MG IJ SOLR
125.0000 mg | Freq: Once | INTRAMUSCULAR | Status: DC
  Filled 2012-08-11 (×2): qty 2

## 2012-08-11 MED ORDER — IPRATROPIUM BROMIDE 0.02 % IN SOLN
0.5000 mg | RESPIRATORY_TRACT | Status: DC
  Administered 2012-08-11: 0.5 mg via RESPIRATORY_TRACT
  Filled 2012-08-11: qty 2.5

## 2012-08-11 NOTE — ED Provider Notes (Signed)
Medical screening examination/treatment/procedure(s) were performed by non-physician practitioner and as supervising physician I was immediately available for consultation/collaboration.  Doug Sou, MD 08/11/12 [phone]

## 2012-08-11 NOTE — ED Notes (Signed)
NAD noted at time of d/c home 

## 2012-08-11 NOTE — ED Provider Notes (Signed)
History     CSN: [id number]  Arrival date & time 08/11/12  1027   First MD Initiated Contact with Patient 08/11/12 1033      Chief Complaint  Patient presents with  . Shortness of Breath  . Asthma    (Consider location/radiation/quality/duration/timing/severity/associated sxs/prior treatment) HPI  Anne Schaefer is a 42 y.o. female seen urgently with exacerbation of asthma for 7 days.  Symptoms became acute this morning after patient awoke from sleep with coughing. Wheezing is described as moderate to severe. Associated symptoms:congestion and dry cough. Current asthma medications: ProAir. Patient quit smoking cigarettes 1 year ago.  No history of hospitalization or intubation    Past Medical History  Diagnosis Date  . Asthma   . Obesity   . Seasonal allergies     Past Surgical History  Procedure Date  . Tubal ligation   . Cesarean section   . Forehead reconstruction     as a child    No family history on file.  History  Substance Use Topics  . Smoking status: Former Smoker    Types: Cigarettes    Quit date: 08/12/2011  . Smokeless tobacco: Not on file  . Alcohol Use: 0.0 oz/week     Comment: occasion    OB History    Grav Para Term Preterm Abortions TAB SAB Ect Mult Living                  Review of Systems Ten systems reviewed and are negative for acute change, except as noted in the HPI. ] Allergies  Ibuprofen and Azithromycin  Home Medications   Current Outpatient Rx  Name  Route  Sig  Dispense  Refill  . ACETAMINOPHEN 500 MG PO TABS   Oral   Take 1,000 mg by mouth every 6 (six) hours as needed. For pain         . ALBUTEROL SULFATE HFA 108 (90 BASE) MCG/ACT IN AERS   Inhalation   Inhale 2 puffs into the lungs every 6 (six) hours as needed. For shortness of breath           Pulse 104  Temp 98.7 F (37.1 C) (Oral)  Resp 20  Ht 5\' 2"  (1.575 m)  Wt 230 lb (104.327 kg)  BMI 42.07 kg/m2  SpO2 99%  LMP 07/30/2012  Physical  Exam Physical Exam  Nursing note and vitals reviewed. Constitutional: She is oriented to person, place, and time. She appears well-developed and well-nourished. No distress.  HENT:  Head: Normocephalic and atraumatic.  Eyes: Conjunctivae normal and EOM are normal. Pupils are equal, round, and reactive to light. No scleral icterus.  Neck: Normal range of motion.  Cardiovascular: Normal rate, regular rhythm and normal heart sounds.  Exam reveals no gallop and no friction rub.   No murmur heard. Pulmonary/Chest: Prolonged expiratory phase with diffuse expiratory wheezes.  No accessory muscle use. Abdominal: Soft. Bowel sounds are normal. She exhibits no distension and no mass. There is no tenderness. There is no guarding.  Neurological: She is alert and oriented to person, place, and time.  Skin: Skin is warm and dry. She is not diaphoretic.    ED Course  Procedures (including critical care time)  Labs Reviewed - No data to display No results found.  Date: 08/11/2012  Rate: 91  Rhythm: normal sinus rhythm  QRS Axis: normal  Intervals: normal  ST/T Wave abnormalities: normal  Conduction Disutrbances: none  Narrative Interpretation:   Old EKG Reviewed: No significant  changes noted     1. Asthma exacerbation       MDM  12:39 PM Patient has received 2 nebulizer at treatments and 125 IV Solu-Medrol.  She continues to have expiratory wheezes and prolonged expiratory phase.  I've ordered an hour long nebulizer treatment and respiratory therapy will evaluate.   2:09 PM Patient feels much better.  Her wheezing and diminished breath sounds have improved greatly.  She is feeling well.  Going to discharge the patient with a prescription for albuterol and a short course of steroids.  Patient may followup with her primary care physician.  At this time there does not appear to be any evidence of an acute emergency medical condition and the patient appears stable for discharge with  appropriate outpatient follow up.Diagnosis was discussed with patient who verbalizes understanding and is agreeable to discharge. Pt case discussed with Dr. Ethelda Chick  who agrees with my plan.         Arthor Captain, PA-C 08/11/12 1414

## 2012-08-11 NOTE — ED Notes (Addendum)
Per [facility] and pt, she had been coughing a lot last night and when she woke up this morning was having more trouble breathing. Hx of asthma. EMS reports wheezing throughout and given 5 mg Albuterol, then to Duoneb, and given 125 mg Solumedrol. #20 to [facility]. Pt reports soreness to her chest when she coughs. Pt reports she has been out of her Albuterol for 2 days.

## 2012-10-11 ENCOUNTER — Encounter (HOSPITAL_COMMUNITY): Payer: Self-pay | Admitting: *Deleted

## 2012-10-11 ENCOUNTER — Emergency Department (HOSPITAL_COMMUNITY)
Admission: EM | Admit: 2012-10-11 | Discharge: 2012-10-11 | Disposition: A | Discharge status: Home / Self Care | Payer: Self-pay | Attending: Emergency Medicine | Admitting: Emergency Medicine

## 2012-10-11 DIAGNOSIS — Z87891 Personal history of nicotine dependence: Secondary | ICD-10-CM | POA: Insufficient documentation

## 2012-10-11 DIAGNOSIS — R0789 Other chest pain: Secondary | ICD-10-CM | POA: Insufficient documentation

## 2012-10-11 DIAGNOSIS — R062 Wheezing: Secondary | ICD-10-CM | POA: Insufficient documentation

## 2012-10-11 DIAGNOSIS — E669 Obesity, unspecified: Secondary | ICD-10-CM | POA: Insufficient documentation

## 2012-10-11 DIAGNOSIS — J309 Allergic rhinitis, unspecified: Secondary | ICD-10-CM | POA: Insufficient documentation

## 2012-10-11 DIAGNOSIS — Z79899 Other long term (current) drug therapy: Secondary | ICD-10-CM | POA: Insufficient documentation

## 2012-10-11 DIAGNOSIS — J45901 Unspecified asthma with (acute) exacerbation: Secondary | ICD-10-CM | POA: Insufficient documentation

## 2012-10-11 MED ORDER — LORATADINE 10 MG PO TABS: 10.0000 mg | ORAL_TABLET | Freq: Every day | ORAL | Status: DC

## 2012-10-11 MED ORDER — PREDNISONE 50 MG PO TABS: 50.0000 mg | ORAL_TABLET | Freq: Every day | ORAL | Status: DC

## 2012-10-11 MED ORDER — ALBUTEROL SULFATE HFA 108 (90 BASE) MCG/ACT IN AERS
1.0000 | INHALATION_SPRAY | Freq: Once | RESPIRATORY_TRACT | Status: AC
  Administered 2012-10-11: 1 via RESPIRATORY_TRACT
  Filled 2012-10-11: qty 6.7

## 2012-10-11 MED ORDER — ALBUTEROL (5 MG/ML) CONTINUOUS INHALATION SOLN
10.0000 mg/h | INHALATION_SOLUTION | RESPIRATORY_TRACT | Status: DC
  Administered 2012-10-11: 10 mg/h via RESPIRATORY_TRACT
  Filled 2012-10-11: qty 20

## 2012-10-11 MED ORDER — METHYLPREDNISOLONE SODIUM SUCC 125 MG IJ SOLR
125.0000 mg | Freq: Once | INTRAMUSCULAR | Status: AC
  Administered 2012-10-11: 125 mg via INTRAVENOUS
  Filled 2012-10-11: qty 2

## 2012-10-11 NOTE — ED Provider Notes (Signed)
History     CSN: [id number]  Arrival date & time 10/11/12  1821   First MD Initiated Contact with Patient 10/11/12 1835      Chief Complaint  Patient presents with  . Asthma    (Consider location/radiation/quality/duration/timing/severity/associated sxs/prior treatment) HPI Pt with history of asthma, out of albuterol INH, seen in Ed 3 times this year and no prior intubations p/w 2 days of increasing SOB, wheezing and chest tightness. States she frequently has attacks with change of weather. No cough, fever chills, URI symptoms.  Past Medical History  Diagnosis Date  . Asthma   . Obesity   . Seasonal allergies     Past Surgical History  Procedure Laterality Date  . Tubal ligation    . Cesarean section    . Forehead reconstruction      as a child    History reviewed. No pertinent family history.  History  Substance Use Topics  . Smoking status: Former Smoker    Types: Cigarettes    Quit date: 08/12/2011  . Smokeless tobacco: Not on file  . Alcohol Use: 0.0 oz/week     Comment: occasion    OB History   Grav Para Term Preterm Abortions TAB SAB Ect Mult Living                  Review of Systems  Constitutional: Negative for fever and chills.  Respiratory: Positive for chest tightness, shortness of breath and wheezing. Negative for cough.   Cardiovascular: Negative for chest pain, palpitations and leg swelling.  Gastrointestinal: Negative for nausea, vomiting and abdominal pain.  All other systems reviewed and are negative.    Allergies  Ibuprofen and Azithromycin  Home Medications   Current Outpatient Rx  Name  Route  Sig  Dispense  Refill  . acetaminophen (TYLENOL) 500 MG tablet   Oral   Take 1,000 mg by mouth every 6 (six) hours as needed. For pain         . albuterol (PROVENTIL HFA;VENTOLIN HFA) 108 (90 BASE) MCG/ACT inhaler   Inhalation   Inhale 2 puffs into the lungs every 6 (six) hours as needed. For shortness of breath         .  loratadine (CLARITIN) 10 MG tablet   Oral   Take 1 tablet (10 mg total) by mouth daily. One po daily x 5 days   30 tablet   0   . predniSONE (DELTASONE) 50 MG tablet   Oral   Take 1 tablet (50 mg total) by mouth daily.   5 tablet   0     BP 150/83  Pulse 101  Temp(Src) 99 F (37.2 C) (Oral)  Resp 23  SpO2 100%  LMP 09/27/2012  Physical Exam  Nursing note and vitals reviewed. Constitutional: She is oriented to person, place, and time. She appears well-developed and well-nourished. No distress.  HENT:  Head: Normocephalic and atraumatic.  Mouth/Throat: Oropharynx is clear and moist.  Eyes: EOM are normal. Pupils are equal, round, and reactive to light.  Neck: Normal range of motion. Neck supple.  Cardiovascular: Normal rate and regular rhythm.   Pulmonary/Chest: Effort normal. No respiratory distress. She has wheezes (expiratory wheezing). She has no rales. She exhibits tenderness (anterior parasternal chest tenderness, no crepitance or deformity. ).  Abdominal: Soft. Bowel sounds are normal. There is no tenderness. There is no rebound and no guarding.  Musculoskeletal: Normal range of motion. She exhibits no edema and no tenderness.  Neurological: She is  alert and oriented to person, place, and time.  Skin: Skin is warm and dry. No rash noted. No erythema.  Psychiatric: She has a normal mood and affect. Her behavior is normal.    ED Course  Procedures (including critical care time)  Labs Reviewed - No data to display No results found.   1. Asthma exacerbation       MDM    Pt states she is at her baseline. No wheezing or respiratory distress. Return precautions given      Loren Racer, MD 10/11/12 2214

## 2012-10-11 NOTE — ED Notes (Signed)
Reports having asthma and sob since weather change, is out of her inhaler. spo2 98%, audible wheezing at triage.

## 2012-10-22 ENCOUNTER — Encounter (HOSPITAL_COMMUNITY): Payer: Self-pay | Admitting: Emergency Medicine

## 2012-10-22 ENCOUNTER — Emergency Department (HOSPITAL_COMMUNITY): Payer: Self-pay

## 2012-10-22 ENCOUNTER — Emergency Department (HOSPITAL_COMMUNITY)
Admission: EM | Admit: 2012-10-22 | Discharge: 2012-10-22 | Disposition: A | Discharge status: Home / Self Care | Payer: Self-pay | Attending: Emergency Medicine | Admitting: Emergency Medicine

## 2012-10-22 DIAGNOSIS — Z87891 Personal history of nicotine dependence: Secondary | ICD-10-CM | POA: Insufficient documentation

## 2012-10-22 DIAGNOSIS — R05 Cough: Secondary | ICD-10-CM | POA: Insufficient documentation

## 2012-10-22 DIAGNOSIS — J45901 Unspecified asthma with (acute) exacerbation: Secondary | ICD-10-CM

## 2012-10-22 DIAGNOSIS — Z79899 Other long term (current) drug therapy: Secondary | ICD-10-CM | POA: Insufficient documentation

## 2012-10-22 DIAGNOSIS — J40 Bronchitis, not specified as acute or chronic: Secondary | ICD-10-CM

## 2012-10-22 DIAGNOSIS — R0789 Other chest pain: Secondary | ICD-10-CM | POA: Insufficient documentation

## 2012-10-22 DIAGNOSIS — E669 Obesity, unspecified: Secondary | ICD-10-CM | POA: Insufficient documentation

## 2012-10-22 DIAGNOSIS — R059 Cough, unspecified: Secondary | ICD-10-CM | POA: Insufficient documentation

## 2012-10-22 MED ORDER — ALBUTEROL SULFATE (5 MG/ML) 0.5% IN NEBU
5.0000 mg | INHALATION_SOLUTION | Freq: Once | RESPIRATORY_TRACT | Status: AC
  Administered 2012-10-22: 5 mg via RESPIRATORY_TRACT
  Filled 2012-10-22: qty 1

## 2012-10-22 MED ORDER — ALBUTEROL SULFATE HFA 108 (90 BASE) MCG/ACT IN AERS
2.0000 | INHALATION_SPRAY | Freq: Once | RESPIRATORY_TRACT | Status: AC
  Administered 2012-10-22: 2 via RESPIRATORY_TRACT
  Filled 2012-10-22: qty 6.7

## 2012-10-22 MED ORDER — LEVOFLOXACIN 500 MG PO TABS: 500.0000 mg | ORAL_TABLET | Freq: Every day | ORAL | Status: DC

## 2012-10-22 MED ORDER — IPRATROPIUM BROMIDE 0.02 % IN SOLN
0.5000 mg | Freq: Once | RESPIRATORY_TRACT | Status: AC
  Administered 2012-10-22: 0.5 mg via RESPIRATORY_TRACT
  Filled 2012-10-22: qty 2.5

## 2012-10-22 MED ORDER — PREDNISONE 20 MG PO TABS
60.0000 mg | ORAL_TABLET | Freq: Once | ORAL | Status: AC
  Administered 2012-10-22: 60 mg via ORAL
  Filled 2012-10-22: qty 3

## 2012-10-22 MED ORDER — ASPIRIN 325 MG PO TABS
ORAL_TABLET | ORAL | Status: AC
  Filled 2012-10-22: qty 1

## 2012-10-22 MED ORDER — KETOROLAC TROMETHAMINE 60 MG/2ML IM SOLN
60.0000 mg | Freq: Once | INTRAMUSCULAR | Status: AC
  Administered 2012-10-22: 60 mg via INTRAMUSCULAR
  Filled 2012-10-22: qty 2

## 2012-10-22 NOTE — ED Notes (Signed)
Ambulated patient in the hall and O2 Sat maintained at 97 to 98 % on RA. Patient tolerated well
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
## 2012-10-22 NOTE — ED Notes (Signed)
Back from xray

## 2012-10-22 NOTE — ED Notes (Signed)
Patient discharged using the teach back method patient able to verbalize an understanding

## 2012-10-22 NOTE — ED Notes (Signed)
Patient transported to X-ray 

## 2012-10-22 NOTE — ED Provider Notes (Signed)
Medical screening examination/treatment/procedure(s) were performed by non-physician practitioner and as supervising physician I was immediately available for consultation/collaboration.   Anne Schaefer. Oletta Lamas, MD 10/22/12 [phone]

## 2012-10-22 NOTE — ED Notes (Signed)
Patient advised that she woke this morning and had shortness of breath and productive cough with clear sputum. History of asthma,

## 2012-10-27 ENCOUNTER — Emergency Department (HOSPITAL_COMMUNITY)
Admission: EM | Admit: 2012-10-27 | Discharge: 2012-10-28 | Disposition: A | Discharge status: Home / Self Care | Payer: Self-pay | Attending: Emergency Medicine | Admitting: Emergency Medicine

## 2012-10-27 ENCOUNTER — Encounter (HOSPITAL_COMMUNITY): Payer: Self-pay | Admitting: *Deleted

## 2012-10-27 DIAGNOSIS — J45909 Unspecified asthma, uncomplicated: Secondary | ICD-10-CM

## 2012-10-27 DIAGNOSIS — E669 Obesity, unspecified: Secondary | ICD-10-CM | POA: Insufficient documentation

## 2012-10-27 DIAGNOSIS — L0201 Cutaneous abscess of face: Secondary | ICD-10-CM | POA: Insufficient documentation

## 2012-10-27 DIAGNOSIS — L03211 Cellulitis of face: Secondary | ICD-10-CM | POA: Insufficient documentation

## 2012-10-27 DIAGNOSIS — Z87891 Personal history of nicotine dependence: Secondary | ICD-10-CM | POA: Insufficient documentation

## 2012-10-27 DIAGNOSIS — J45901 Unspecified asthma with (acute) exacerbation: Secondary | ICD-10-CM | POA: Insufficient documentation

## 2012-10-27 DIAGNOSIS — Z79899 Other long term (current) drug therapy: Secondary | ICD-10-CM | POA: Insufficient documentation

## 2012-10-27 MED ORDER — OXYCODONE-ACETAMINOPHEN 5-325 MG PO TABS
1.0000 | ORAL_TABLET | Freq: Once | ORAL | Status: AC
  Administered 2012-10-27: 1 via ORAL
  Filled 2012-10-27: qty 1

## 2012-10-27 MED ORDER — ALBUTEROL SULFATE (5 MG/ML) 0.5% IN NEBU
5.0000 mg | INHALATION_SOLUTION | Freq: Once | RESPIRATORY_TRACT | Status: AC
  Administered 2012-10-27: 5 mg via RESPIRATORY_TRACT
  Filled 2012-10-27: qty 1

## 2012-10-27 NOTE — ED Notes (Addendum)
C/o sob and R sub mandibular abscess. Some drainage noted. Warm red and sore. Also submental soreness. Onset yesterday gradually worse. Also sob. Onset last week. H/o asthma. Seen for sob earlier this week. "was not able to get meds prescribed". Alert, NAD, calm, interactive, skin W&D, resps e/u, speaking in clerar complete sentences, VS reviewed, VSS, 98% RA.

## 2012-10-28 MED ORDER — ALBUTEROL SULFATE (5 MG/ML) 0.5% IN NEBU
5.0000 mg | INHALATION_SOLUTION | Freq: Once | RESPIRATORY_TRACT | Status: AC
  Administered 2012-10-28: 5 mg via RESPIRATORY_TRACT
  Filled 2012-10-28: qty 1

## 2012-10-28 MED ORDER — PREDNISONE 10 MG PO TABS: 10.0000 mg | ORAL_TABLET | Freq: Every day | ORAL | Status: DC

## 2012-10-28 MED ORDER — OXYCODONE-ACETAMINOPHEN 5-325 MG PO TABS
1.0000 | ORAL_TABLET | ORAL | Status: DC | PRN
Start: 2012-10-28 — End: 2012-11-23

## 2012-10-28 MED ORDER — IPRATROPIUM BROMIDE 0.02 % IN SOLN
0.5000 mg | Freq: Once | RESPIRATORY_TRACT | Status: AC
  Administered 2012-10-28: 0.5 mg via RESPIRATORY_TRACT
  Filled 2012-10-28: qty 2.5

## 2012-10-28 MED ORDER — SULFAMETHOXAZOLE-TRIMETHOPRIM 800-160 MG PO TABS: 1.0000 | ORAL_TABLET | Freq: Two times a day (BID) | ORAL | Status: DC

## 2012-10-28 MED ORDER — CEPHALEXIN 500 MG PO CAPS: 500.0000 mg | ORAL_CAPSULE | Freq: Four times a day (QID) | ORAL | Status: DC

## 2012-10-28 MED ORDER — PREDNISONE 20 MG PO TABS
60.0000 mg | ORAL_TABLET | Freq: Once | ORAL | Status: AC
  Administered 2012-10-28: 60 mg via ORAL
  Filled 2012-10-28: qty 3

## 2012-10-28 NOTE — ED Notes (Signed)
Pt given discharge paperwork.  No additional questions by pt regarding d/c.  Verbalized understanding of f/u and medications.  VSS. Resps e/u.  E-signature obtained. 

## 2012-10-28 NOTE — ED Provider Notes (Signed)
History     CSN: [id number]  Arrival date & time 10/27/12  2134   First MD Initiated Contact with Patient 10/27/12 2350      Chief Complaint  Patient presents with  . Shortness of Breath  . Abscess   HPI  History provided by the patient. Patient is a 42 year old female with past history of asthma who presents with complaints of pain and soreness to the right face and chin area. She reports having small area like a "pimple" that began one to 2 days ago. Patient does admit to squeezing at the area with only a small amount of drainage. Since that time areas become more red and tender. She also complains of some additional tenderness to the anterior part of her chin and neck. She denies any associated fever, chills or sweats. No swelling under the tongue or in the throat. Patient does also complain of some increased wheezing and asthma symptoms. She was seen in emergency room recently for similar. Patient has been using albuterol inhaler with improvements. Denies any cough. No chest pains.    Past Medical History  Diagnosis Date  . Asthma   . Obesity   . Seasonal allergies     Past Surgical History  Procedure Laterality Date  . Tubal ligation    . Cesarean section    . Forehead reconstruction      as a child    No family history on file.  History  Substance Use Topics  . Smoking status: Former Smoker    Types: Cigarettes    Quit date: 08/12/2011  . Smokeless tobacco: Not on file  . Alcohol Use: 0.0 oz/week     Comment: occasion    OB History   Grav Para Term Preterm Abortions TAB SAB Ect Mult Living                  Review of Systems  Constitutional: Negative for fever and chills.  Respiratory: Positive for shortness of breath and wheezing. Negative for cough.   All other systems reviewed and are negative.    Allergies  Ibuprofen and Azithromycin  Home Medications   Current Outpatient Rx  Name  Route  Sig  Dispense  Refill  . acetaminophen (TYLENOL) 500  MG tablet   Oral   Take 1,000 mg by mouth every 6 (six) hours as needed. For pain         . albuterol (PROVENTIL HFA;VENTOLIN HFA) 108 (90 BASE) MCG/ACT inhaler   Inhalation   Inhale 2 puffs into the lungs every 6 (six) hours as needed. For shortness of breath         . loratadine (CLARITIN) 10 MG tablet   Oral   Take 10 mg by mouth daily.           BP 155/87  Pulse 87  Temp(Src) 98.6 F (37 C) (Oral)  Resp 18  SpO2 98%  LMP 10/23/2012  Physical Exam  Nursing note and vitals reviewed. Constitutional: She is oriented to person, place, and time. She appears well-developed and well-nourished. No distress.  HENT:  Head: Normocephalic.  Neck: Normal range of motion. Neck supple.  Submandibular lymphadenopathy present  Cardiovascular: Normal rate and regular rhythm.   Pulmonary/Chest: Effort normal. She has wheezes.  Abdominal: Soft. There is no tenderness.  Musculoskeletal: Normal range of motion.  Neurological: She is alert and oriented to person, place, and time.  Skin: Skin is warm and dry. No rash noted.  2 and half centimeter red  and swollen nodule to right lower face and chin tender to palpation with mild induration. No bleeding or drainage.  Psychiatric: She has a normal mood and affect. Her behavior is normal.    ED Course  Procedures   INCISION AND DRAINAGE Performed by: Angus Seller Consent: Verbal consent obtained. Risks and benefits: risks, benefits and alternatives were discussed Type: abscess  Body area: Right lower face and chin  Anesthesia: local infiltration  Incision was made with a scalpel.  Local anesthetic: lidocaine 2% with epinephrine  Anesthetic total: 1 ml  Complexity: complex Blunt dissection to break up loculations  Drainage: purulent  Drainage amount: Small   Packing material: None   Patient tolerance: Patient tolerated the procedure well with no immediate complications.       1. Abscess of face   2. Asthma        MDM  11:50PM patient seen and evaluated. Patient appears comfortable in no acute distress.  Small I&D of abscess of the face with small amount of drainage. Was some wheezing on exam requesting breathing treatment for asthma. Otherwise has normal respirations and O2 sats. Breathing treatment given.      Angus Seller, PA-C 10/28/12 [phone]  Medical screening examination/treatment/procedure(s) were performed by non-physician practitioner and as supervising physician I was immediately available for consultation/collaboration.  Derwood Kaplan, MD 10/29/12 [phone]

## 2012-11-23 ENCOUNTER — Emergency Department (HOSPITAL_COMMUNITY): Payer: Self-pay

## 2012-11-23 ENCOUNTER — Emergency Department (HOSPITAL_COMMUNITY)
Admission: EM | Admit: 2012-11-23 | Discharge: 2012-11-23 | Disposition: A | Discharge status: Home / Self Care | Payer: Self-pay | Attending: Emergency Medicine | Admitting: Emergency Medicine

## 2012-11-23 ENCOUNTER — Encounter (HOSPITAL_COMMUNITY): Payer: Self-pay

## 2012-11-23 DIAGNOSIS — Y939 Activity, unspecified: Secondary | ICD-10-CM | POA: Insufficient documentation

## 2012-11-23 DIAGNOSIS — Z87891 Personal history of nicotine dependence: Secondary | ICD-10-CM | POA: Insufficient documentation

## 2012-11-23 DIAGNOSIS — IMO0002 Reserved for concepts with insufficient information to code with codable children: Secondary | ICD-10-CM | POA: Insufficient documentation

## 2012-11-23 DIAGNOSIS — E669 Obesity, unspecified: Secondary | ICD-10-CM | POA: Insufficient documentation

## 2012-11-23 DIAGNOSIS — N760 Acute vaginitis: Secondary | ICD-10-CM | POA: Insufficient documentation

## 2012-11-23 DIAGNOSIS — X58XXXA Exposure to other specified factors, initial encounter: Secondary | ICD-10-CM | POA: Insufficient documentation

## 2012-11-23 DIAGNOSIS — S39012A Strain of muscle, fascia and tendon of lower back, initial encounter: Secondary | ICD-10-CM

## 2012-11-23 DIAGNOSIS — J45909 Unspecified asthma, uncomplicated: Secondary | ICD-10-CM | POA: Insufficient documentation

## 2012-11-23 DIAGNOSIS — Z8719 Personal history of other diseases of the digestive system: Secondary | ICD-10-CM | POA: Insufficient documentation

## 2012-11-23 DIAGNOSIS — Z9851 Tubal ligation status: Secondary | ICD-10-CM | POA: Insufficient documentation

## 2012-11-23 DIAGNOSIS — Z711 Person with feared health complaint in whom no diagnosis is made: Secondary | ICD-10-CM

## 2012-11-23 DIAGNOSIS — R109 Unspecified abdominal pain: Secondary | ICD-10-CM | POA: Insufficient documentation

## 2012-11-23 DIAGNOSIS — Z3202 Encounter for pregnancy test, result negative: Secondary | ICD-10-CM | POA: Insufficient documentation

## 2012-11-23 DIAGNOSIS — Y929 Unspecified place or not applicable: Secondary | ICD-10-CM | POA: Insufficient documentation

## 2012-11-23 DIAGNOSIS — Z79899 Other long term (current) drug therapy: Secondary | ICD-10-CM | POA: Insufficient documentation

## 2012-11-23 LAB — URINALYSIS, ROUTINE W REFLEX MICROSCOPIC
Glucose, UA: NEGATIVE mg/dL
Hgb urine dipstick: NEGATIVE
Protein, ur: NEGATIVE mg/dL

## 2012-11-23 LAB — CBC WITH DIFFERENTIAL/PLATELET
Basophils Absolute: 0.1 10*3/uL (ref 0.0–0.1)
Lymphocytes Relative: 35 % (ref 12–46)
Neutro Abs: 3.5 10*3/uL (ref 1.7–7.7)
Neutrophils Relative %: 52 % (ref 43–77)
Platelets: 427 10*3/uL — ABNORMAL HIGH (ref 150–400)
RDW: 16 % — ABNORMAL HIGH (ref 11.5–15.5)
WBC: 6.8 10*3/uL (ref 4.0–10.5)

## 2012-11-23 LAB — LIPASE, BLOOD: Lipase: 18 U/L (ref 11–59)

## 2012-11-23 LAB — COMPREHENSIVE METABOLIC PANEL
ALT: 14 U/L (ref 0–35)
AST: 15 U/L (ref 0–37)
CO2: 24 mEq/L (ref 19–32)
Chloride: 107 mEq/L (ref 96–112)
GFR calc non Af Amer: 85 mL/min — ABNORMAL LOW (ref >90)
Sodium: 140 mEq/L (ref 135–145)
Total Bilirubin: 0.2 mg/dL — ABNORMAL LOW (ref 0.3–1.2)

## 2012-11-23 LAB — WET PREP, GENITAL
Trich, Wet Prep: NONE SEEN
Yeast Wet Prep HPF POC: NONE SEEN

## 2012-11-23 MED ORDER — HYDROCODONE-ACETAMINOPHEN 5-325 MG PO TABS: 2.0000 | ORAL_TABLET | ORAL | Status: DC | PRN

## 2012-11-23 MED ORDER — CEFTRIAXONE SODIUM 250 MG IJ SOLR
250.0000 mg | Freq: Once | INTRAMUSCULAR | Status: AC
  Administered 2012-11-23: 250 mg via INTRAMUSCULAR
  Filled 2012-11-23: qty 250

## 2012-11-23 MED ORDER — DOXYCYCLINE HYCLATE 100 MG PO CAPS: 100.0000 mg | ORAL_CAPSULE | Freq: Two times a day (BID) | ORAL | Status: DC

## 2012-11-23 MED ORDER — IOHEXOL 300 MG/ML  SOLN
100.0000 mL | Freq: Once | INTRAMUSCULAR | Status: AC | PRN
  Administered 2012-11-23: 100 mL via INTRAVENOUS

## 2012-11-23 MED ORDER — IOHEXOL 300 MG/ML  SOLN
50.0000 mL | Freq: Once | INTRAMUSCULAR | Status: AC | PRN
  Administered 2012-11-23: 50 mL via ORAL

## 2012-11-23 MED ORDER — PROMETHAZINE HCL 25 MG PO TABS: 25.0000 mg | ORAL_TABLET | Freq: Four times a day (QID) | ORAL | Status: DC | PRN

## 2012-11-23 MED ORDER — LIDOCAINE HCL (PF) 1 % IJ SOLN
INTRAMUSCULAR | Status: AC
  Administered 2012-11-23: 5 mL
  Filled 2012-11-23: qty 5

## 2012-11-23 MED ORDER — MORPHINE SULFATE 4 MG/ML IJ SOLN
4.0000 mg | Freq: Once | INTRAMUSCULAR | Status: AC
  Administered 2012-11-23: 4 mg via INTRAVENOUS
  Filled 2012-11-23: qty 1

## 2012-11-23 NOTE — ED Provider Notes (Signed)
  Medical screening examination/treatment/procedure(s) were performed by non-physician practitioner and as supervising physician I was immediately available for consultation/collaboration.    Gerhard Munch, MD 11/23/12 [phone]

## 2012-11-23 NOTE — ED Notes (Signed)
Patient given discharge instructions for vaginal infection. rx for doxycline, norco, and phenergan. Advised to [facility]. Patient voiced understanding of all instructions and had no further questions. Patient ambulated to front lobby.

## 2012-11-23 NOTE — ED Provider Notes (Signed)
History     CSN: [id number]  Arrival date & time 11/23/12  1511   First MD Initiated Contact with Patient 11/23/12 1720      Chief Complaint  Patient presents with  . Abdominal Pain    (Consider location/radiation/quality/duration/timing/severity/associated sxs/prior treatment) The history is provided by the patient and medical records. No language interpreter was used.    Anne Schaefer is a 42 y.o. female  with a hx of asthma, diverticulitis presents to the Emergency Department complaining of gradual, persistent, progressively worsening right sided lower abdominal pain radiating from the back to the front beginning 2 days ago with acute worsening this AM.  Pain was intermittent until this morning.  Pt describes the pain as throbbing and rated at a 9/10 . Pt denies associated symptoms.  Nothing makes it better and laying on the right side makes it worse.  Pt denies fever, chills, nausea, vomiting, dysuria, hematuria, vaginal discharge, vaginal bleeding.  Pt LMP 11/15/12 and states slightly heavier menses this month.  G5P5.  Pt with tubal ligation in 09/1996 and c-section.   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          Past Medical History  Diagnosis Date  . Asthma   . Obesity   . Seasonal allergies     Past Surgical History  Procedure Laterality Date  . Tubal ligation    . Cesarean section    . Forehead reconstruction      as a child    No family history on file.  History  Substance Use Topics  . Smoking status: Former Smoker    Types: Cigarettes    Quit date: 08/12/2011  . Smokeless tobacco: Not on file  . Alcohol Use: 0.0 oz/week     Comment: occasion    OB History   Grav Para Term Preterm Abortions TAB SAB Ect Mult Living                  Review of Systems  Constitutional: Negative for fever, diaphoresis, appetite change, fatigue and unexpected weight change.  HENT: Negative  for mouth sores, trouble swallowing, neck pain and neck stiffness.   Respiratory: Negative for cough, chest tightness, shortness of breath, wheezing and stridor.   Cardiovascular: Negative for chest pain and palpitations.  Gastrointestinal: Positive for abdominal pain. Negative for nausea, vomiting, diarrhea, constipation, blood in stool, abdominal distention and rectal pain.  Genitourinary: Negative for dysuria, urgency, frequency, hematuria, flank pain and difficulty urinating.  Musculoskeletal: Negative for back pain.  Skin: Negative for rash.  Neurological: Negative for weakness.  Hematological: Negative for adenopathy.  Psychiatric/Behavioral: Negative for confusion.  All other systems reviewed and are negative.    Allergies  Ibuprofen and Azithromycin  Home Medications   Current Outpatient Rx  Name  Route  Sig  Dispense  Refill  . albuterol (PROVENTIL HFA;VENTOLIN HFA) 108 (90 BASE) MCG/ACT inhaler   Inhalation   Inhale 2 puffs into the lungs every 6 (six) hours as needed. For shortness of breath         . loratadine (CLARITIN) 10 MG tablet   Oral   Take 10 mg by mouth daily as needed for allergies.          Marland Kitchen doxycycline (VIBRAMYCIN) 100 MG capsule   Oral   Take 1 capsule (100 mg total) by mouth 2 (two) times daily.   20  capsule   0   . HYDROcodone-acetaminophen (NORCO/VICODIN) 5-325 MG per tablet   Oral   Take 2 tablets by mouth every 4 (four) hours as needed for pain.   6 tablet   0   . promethazine (PHENERGAN) 25 MG tablet   Oral   Take 1 tablet (25 mg total) by mouth every 6 (six) hours as needed for nausea.   12 tablet   0     BP 162/99  Pulse 90  Temp(Src) 98.1 F (36.7 C) (Oral)  Resp 18  SpO2 100%  LMP 11/20/2012  Physical Exam  Nursing note and vitals reviewed. Constitutional: She appears well-developed and well-nourished.  HENT:  Head: Normocephalic and atraumatic.  Mouth/Throat: Oropharynx is clear and moist.  Eyes: Conjunctivae and  EOM are normal. Pupils are equal, round, and reactive to light. No scleral icterus.  Neck: Normal range of motion.  Cardiovascular: Normal rate, regular rhythm, normal heart sounds and intact distal pulses.   No murmur heard. Pulmonary/Chest: Effort normal and breath sounds normal.  Abdominal: Soft. Normal appearance and bowel sounds are normal. She exhibits no distension and no mass. There is tenderness in the right lower quadrant. There is rebound (RLQ) and guarding. There is no CVA tenderness. Hernia confirmed negative in the right inguinal area and confirmed negative in the left inguinal area.  Genitourinary: There is no rash, tenderness, lesion or injury on the right labia. There is no rash, tenderness, lesion or injury on the left labia. Uterus is not deviated, not enlarged, not fixed and not tender. Cervix exhibits no motion tenderness, no discharge and no friability. Right adnexum displays no mass, no tenderness and no fullness. Left adnexum displays no mass, no tenderness and no fullness. No erythema, tenderness or bleeding around the vagina. No foreign body around the vagina. No signs of injury around the vagina. No vaginal discharge found.  Musculoskeletal: Normal range of motion. She exhibits no edema and no tenderness.       Lumbar back: She exhibits tenderness and pain. She exhibits normal range of motion, no bony tenderness, no swelling, no edema, no deformity, no laceration and no spasm.       Back:  Right L spine paraspinal tenderness, reproducible with palpation No midline tenderness  Lymphadenopathy:    She has no cervical adenopathy.       Right: No inguinal adenopathy present.       Left: No inguinal adenopathy present.  Neurological: She is alert. She exhibits normal muscle tone. Coordination normal.  Skin: Skin is warm and dry. No rash noted. No erythema.  Psychiatric: She has a normal mood and affect.    ED Course  Procedures (including critical care time)  Labs  Reviewed  WET PREP, GENITAL - Abnormal; Notable for the following:    Clue Cells Wet Prep HPF POC FEW (*)    WBC, Wet Prep HPF POC MODERATE (*)    All other components within normal limits  CBC WITH DIFFERENTIAL - Abnormal; Notable for the following:    Hemoglobin 10.1 (*)    HCT 30.6 (*)    MCV 73.0 (*)    MCH 24.1 (*)    RDW 16.0 (*)    Platelets 427 (*)    All other components within normal limits  COMPREHENSIVE METABOLIC PANEL - Abnormal; Notable for the following:    Glucose, Bld 105 (*)    Total Bilirubin 0.2 (*)    GFR calc non Af Amer 85 (*)    All  other components within normal limits  GC/CHLAMYDIA PROBE AMP  AMYLASE  URINALYSIS, ROUTINE W REFLEX MICROSCOPIC  LIPASE, BLOOD  POCT PREGNANCY, URINE   Ct Abdomen Pelvis W Contrast  11/23/2012   *RADIOLOGY REPORT*  Clinical Data: Right lower quadrant pain.  CT ABDOMEN AND PELVIS WITH CONTRAST  Technique:  Multidetector CT imaging of the abdomen and pelvis was performed following the standard protocol during bolus administration of intravenous contrast.  Contrast: OMNIPAQUE IOHEXOL 300 MG/ML  SOLN  Comparison: CT abdomen pelvis 04/08/2012.  Findings: There is some unchanged scarring in the periphery of the right lower lobe.  No pleural or pericardial effusion.  The gallbladder, liver, spleen, biliary tree, adrenal glands, pancreas and kidneys all appear normal.  The appendix is well visualized in the right lower quadrant and normal in appearance.  Again seen is a right ovarian dermoid cyst measuring 4.4 x 4.9 x 5.8 cm.  Left ovary is unremarkable.  Small hyperenhancing lesion in the uterus is consistent with a fibroid. There is no lymphadenopathy or fluid.  No focal bony abnormality is identified.  IMPRESSION:  1.  Negative for appendicitis or other acute abnormality. 2.  Right ovarian dermoid.   Original Report Authenticated By: Holley Dexter, M.D.     1. Concern about STD in female without diagnosis   2. Vaginal infection     3. Abdominal pain   4. Back strain, initial encounter       MDM  [facility] presents with RLQ abd pain.  Patient is nontoxic, nonseptic appearing, in no apparent distress.  Patient's pain and other symptoms adequately managed in emergency department.  Labs, imaging and vitals reviewed.  I personally reviewed the imaging tests through PACS system.  I reviewed available ER/hospitalization records through the EMR. Patient does not meet the SIRS or Sepsis criteria.  On repeat exam patient does not have a surgical abdomin and there are nor peritoneal signs.  No indication of appendicitis, bowel obstruction, bowel perforation, cholecystitis, diverticulitis.  Pt has been treated prophylacticly with rocephin and home doxycycline Rx due to pts history, pelvic exam, and wet prep with increased WBCs. Pt not concerning for PID because hemodynamically stable and no cervical motion tenderness on pelvic exam.   Patient to be discharged with instructions to follow up with OBGYN. Discussed importance of using protection when sexually active. Pt understands that they have GC/Chlamydia cultures pending and that they will need to inform all sexual partners if results return positive.  Back pain likely 2/2 to muscle strain. Patient discharged home with symptomatic treatment and given strict instructions for follow-up with their primary care physician and OBGYN.  I have also discussed reasons to return immediately to the ER.  Patient expresses understanding and agrees with plan.          Dierdre Forth, PA-C 11/23/12 [id number]

## 2012-11-23 NOTE — ED Notes (Signed)
Pt. Having lower abdominal pain and believes it is due to a recent diagnosis of cyst on Lt. Ovary.   Pt. Denies any vaginal discharge or bleeding.   Pt. Also has  A hx of diverticulitis.   She denies any fevers , n/v/d.

## 2012-11-24 LAB — GC/CHLAMYDIA PROBE AMP: GC Probe RNA: NEGATIVE

## 2012-12-15 ENCOUNTER — Emergency Department (HOSPITAL_COMMUNITY)
Admission: EM | Admit: 2012-12-15 | Discharge: 2012-12-15 | Disposition: A | Discharge status: Home / Self Care | Payer: Self-pay | Attending: Emergency Medicine | Admitting: Emergency Medicine

## 2012-12-15 ENCOUNTER — Encounter (HOSPITAL_COMMUNITY): Payer: Self-pay | Admitting: Emergency Medicine

## 2012-12-15 DIAGNOSIS — E669 Obesity, unspecified: Secondary | ICD-10-CM | POA: Insufficient documentation

## 2012-12-15 DIAGNOSIS — R42 Dizziness and giddiness: Secondary | ICD-10-CM | POA: Insufficient documentation

## 2012-12-15 DIAGNOSIS — Z87891 Personal history of nicotine dependence: Secondary | ICD-10-CM | POA: Insufficient documentation

## 2012-12-15 DIAGNOSIS — Z79899 Other long term (current) drug therapy: Secondary | ICD-10-CM | POA: Insufficient documentation

## 2012-12-15 DIAGNOSIS — J3489 Other specified disorders of nose and nasal sinuses: Secondary | ICD-10-CM | POA: Insufficient documentation

## 2012-12-15 DIAGNOSIS — J45901 Unspecified asthma with (acute) exacerbation: Secondary | ICD-10-CM | POA: Insufficient documentation

## 2012-12-15 MED ORDER — METHYLPREDNISOLONE SODIUM SUCC 125 MG IJ SOLR
125.0000 mg | Freq: Once | INTRAMUSCULAR | Status: AC
  Administered 2012-12-15: 125 mg via INTRAVENOUS
  Filled 2012-12-15: qty 2

## 2012-12-15 MED ORDER — SODIUM CHLORIDE 0.9 % IV BOLUS (SEPSIS)
1000.0000 mL | Freq: Once | INTRAVENOUS | Status: AC
  Administered 2012-12-15: 1000 mL via INTRAVENOUS

## 2012-12-15 MED ORDER — ACETAMINOPHEN 325 MG PO TABS
975.0000 mg | ORAL_TABLET | Freq: Once | ORAL | Status: AC
  Administered 2012-12-15: 975 mg via ORAL
  Filled 2012-12-15: qty 3

## 2012-12-15 MED ORDER — ALBUTEROL SULFATE (5 MG/ML) 0.5% IN NEBU
5.0000 mg | INHALATION_SOLUTION | Freq: Once | RESPIRATORY_TRACT | Status: AC
  Administered 2012-12-15: 5 mg via RESPIRATORY_TRACT
  Filled 2012-12-15: qty 1

## 2012-12-15 MED ORDER — PREDNISONE (PAK) 10 MG PO TABS: 10.0000 mg | ORAL_TABLET | Freq: Every day | ORAL | Status: DC

## 2012-12-15 MED ORDER — IPRATROPIUM BROMIDE 0.02 % IN SOLN
0.5000 mg | Freq: Once | RESPIRATORY_TRACT | Status: AC
  Administered 2012-12-15: 0.5 mg via RESPIRATORY_TRACT
  Filled 2012-12-15: qty 2.5

## 2012-12-15 MED ORDER — ALBUTEROL SULFATE HFA 108 (90 BASE) MCG/ACT IN AERS: 1.0000 | INHALATION_SPRAY | Freq: Four times a day (QID) | RESPIRATORY_TRACT | Status: DC | PRN

## 2012-12-15 MED ORDER — ALBUTEROL SULFATE HFA 108 (90 BASE) MCG/ACT IN AERS
2.0000 | INHALATION_SPRAY | RESPIRATORY_TRACT | Status: DC | PRN
  Administered 2012-12-15: 2 via RESPIRATORY_TRACT
  Filled 2012-12-15: qty 6.7

## 2012-12-15 NOTE — ED Notes (Signed)
Pt amb to BR withouot problem. Ate 100% of dinner.  MD in to evaluate

## 2012-12-15 NOTE — ED Notes (Signed)
Pt is watching TV. States that she is feeling better and wants to have some dinner.

## 2012-12-15 NOTE — ED Notes (Signed)
Medication dispensed with instructions for home use verbalized.

## 2012-12-15 NOTE — ED Provider Notes (Signed)
History     CSN: [id number]  Arrival date & time 12/15/12  1750   First MD Initiated Contact with Patient 12/15/12 1750      Chief Complaint  Patient presents with  . Shortness of Breath    (Consider location/radiation/quality/duration/timing/severity/associated sxs/prior treatment) HPI Comments: Patient with hx asthma reports she has been out of her albuterol inhaler for several days, and her asthma was triggered today by going in and out of rooms of different temperatures at work today.  States she actually was wheezing a little bit last night and it got much worse today.    Pt also developed a headache today, gradual onset, global, throbbing/pulsing, 8/10 intensity, worse with bright light.  Denies fevers, chills, neck stiffness, head trauma, recent illness.  Took aspirin without improvement.   Patient is a 42 y.o. female presenting with shortness of breath. The history is provided by the patient.  Shortness of Breath Associated symptoms: headaches and wheezing   Associated symptoms: no abdominal pain, no chest pain, no cough, no fever, no neck pain, no sore throat and no vomiting     Past Medical History  Diagnosis Date  . Asthma   . Obesity   . Seasonal allergies     Past Surgical History  Procedure Laterality Date  . Tubal ligation    . Cesarean section    . Forehead reconstruction      as a child    History reviewed. No pertinent family history.  History  Substance Use Topics  . Smoking status: Former Smoker    Types: Cigarettes    Quit date: 08/12/2011  . Smokeless tobacco: Not on file  . Alcohol Use: 0.0 oz/week     Comment: occasion    OB History   Grav Para Term Preterm Abortions TAB SAB Ect Mult Living                  Review of Systems  Constitutional: Negative for fever, chills, activity change and appetite change.  HENT: Positive for congestion. Negative for sore throat, neck pain, neck stiffness and sinus pressure.   Respiratory: Positive  for shortness of breath and wheezing. Negative for cough.   Cardiovascular: Negative for chest pain.  Gastrointestinal: Negative for nausea, vomiting, abdominal pain and diarrhea.  Genitourinary: Negative for dysuria, urgency, frequency, vaginal bleeding and vaginal discharge.  Neurological: Positive for light-headedness and headaches. Negative for syncope.  Psychiatric/Behavioral: Negative for confusion.    Allergies  Ibuprofen and Azithromycin  Home Medications   Current Outpatient Rx  Name  Route  Sig  Dispense  Refill  . albuterol (PROVENTIL HFA;VENTOLIN HFA) 108 (90 BASE) MCG/ACT inhaler   Inhalation   Inhale 2 puffs into the lungs every 6 (six) hours as needed. For shortness of breath         . doxycycline (VIBRAMYCIN) 100 MG capsule   Oral   Take 1 capsule (100 mg total) by mouth 2 (two) times daily.   20 capsule   0   . HYDROcodone-acetaminophen (NORCO/VICODIN) 5-325 MG per tablet   Oral   Take 2 tablets by mouth every 4 (four) hours as needed for pain.   6 tablet   0   . loratadine (CLARITIN) 10 MG tablet   Oral   Take 10 mg by mouth daily as needed for allergies.          . promethazine (PHENERGAN) 25 MG tablet   Oral   Take 1 tablet (25 mg total) by mouth every  6 (six) hours as needed for nausea.   12 tablet   0     BP 134/90  Pulse 86  Temp(Src) 98.6 F (37 C) (Oral)  Resp 18  SpO2 100%  LMP 11/20/2012  Physical Exam  Nursing note and vitals reviewed. Constitutional: She appears well-developed and well-nourished. No distress.  HENT:  Head: Normocephalic and atraumatic.  Eyes: Conjunctivae are normal.  Neck: Neck supple.  Cardiovascular: Normal rate and regular rhythm.   Pulmonary/Chest: Effort normal. No respiratory distress. She has wheezes. She has no rales.  Neurological: She is alert.  CN II-XII intact, EOMs intact, no pronator drift, grip strengths equal bilaterally; strength 5/5 in all extremities, sensation intact in all extremities;  finger to nose, heel to shin, rapid alternating movements normal; gait is normal.     Skin: She is not diaphoretic.    ED Course  Procedures (including critical care time)  Labs Reviewed - No data to display No results found.  7:51 PM Pt feeling much better.  Headache relieved.  Lungs CTAB.    Date: 12/15/2012  Rate: 84  Rhythm: normal sinus rhythm  QRS Axis: right  Intervals: normal  ST/T Wave abnormalities: normal  Conduction Disutrbances:none  Narrative Interpretation:   Old EKG Reviewed: unchanged    1. Asthma exacerbation, unspecified asthma severity   2. Headache       MDM  Pt with hx asthma with asthma exacerbation due to known triggers and being out of medications x several days.  Pt also with headache.  No red flags. Improved with medications.  Discussed findings, treatment  with patient.  Pt given return precautions.  Pt verbalizes understanding and agrees with plan.           Trixie Dredge, PA-C 12/15/12 2050

## 2012-12-15 NOTE — ED Notes (Signed)
Pt presents to ED via EMS with complaints of shortness of breath, headache, and thinks her bp is high. EMS gave 5mg  albuterol breathing treatment.

## 2012-12-18 NOTE — ED Provider Notes (Signed)
Medical screening examination/treatment/procedure(s) were performed by non-physician practitioner and as supervising physician I was immediately available for consultation/collaboration.   Garik Diamant, MD 12/18/12 1126 

## 2013-02-24 ENCOUNTER — Emergency Department (HOSPITAL_COMMUNITY)
Admission: EM | Admit: 2013-02-24 | Discharge: 2013-02-24 | Disposition: A | Discharge status: Home / Self Care | Payer: Self-pay | Attending: Emergency Medicine | Admitting: Emergency Medicine

## 2013-02-24 ENCOUNTER — Encounter (HOSPITAL_COMMUNITY): Payer: Self-pay | Admitting: *Deleted

## 2013-02-24 DIAGNOSIS — IMO0002 Reserved for concepts with insufficient information to code with codable children: Secondary | ICD-10-CM | POA: Insufficient documentation

## 2013-02-24 DIAGNOSIS — J309 Allergic rhinitis, unspecified: Secondary | ICD-10-CM | POA: Insufficient documentation

## 2013-02-24 DIAGNOSIS — Z87891 Personal history of nicotine dependence: Secondary | ICD-10-CM | POA: Insufficient documentation

## 2013-02-24 DIAGNOSIS — J45909 Unspecified asthma, uncomplicated: Secondary | ICD-10-CM | POA: Insufficient documentation

## 2013-02-24 DIAGNOSIS — Z23 Encounter for immunization: Secondary | ICD-10-CM | POA: Insufficient documentation

## 2013-02-24 DIAGNOSIS — Z79899 Other long term (current) drug therapy: Secondary | ICD-10-CM | POA: Insufficient documentation

## 2013-02-24 DIAGNOSIS — L02412 Cutaneous abscess of left axilla: Secondary | ICD-10-CM

## 2013-02-24 MED ORDER — TRAMADOL HCL 50 MG PO TABS: 50.0000 mg | ORAL_TABLET | Freq: Four times a day (QID) | ORAL | Status: DC | PRN

## 2013-02-24 MED ORDER — TETANUS-DIPHTH-ACELL PERTUSSIS 5-2.5-18.5 LF-MCG/0.5 IM SUSP
0.5000 mL | Freq: Once | INTRAMUSCULAR | Status: AC
  Administered 2013-02-24: 0.5 mL via INTRAMUSCULAR
  Filled 2013-02-24: qty 0.5

## 2013-02-24 NOTE — ED Notes (Signed)
Pt with abscess to L armpit.

## 2013-02-24 NOTE — ED Provider Notes (Signed)
CSN: [id number]     Arrival date & time 02/24/13  1524 History  This chart was scribed for non-physician practitioner, Fayrene Helper, PA-C working with Gavin Pound. Oletta Lamas, MD by Greggory Stallion, ED scribe. This patient was seen in room TR10C/TR10C and the patient's care was started at 3:37 PM.   Chief Complaint  Patient presents with  . Abscess   The history is provided by the patient. No language interpreter was used.    HPI Comments: Anne Schaefer is a 42 y.o. female who presents to the Emergency Department complaining of gradual onset, constant abscess to her left axilla with associated throbbing pain that she first noticed 2 days ago. She has been using warm compresses but has not taken any medications for the pain. Pt has had abscesses in the past. She states she smokes cigarettes occasionally. Pt can not remember when her last tetanus shot was.  No c/o fever, cp, sob, numbness.  Past Medical History  Diagnosis Date  . Asthma   . Obesity   . Seasonal allergies    Past Surgical History  Procedure Laterality Date  . Tubal ligation    . Cesarean section    . Forehead reconstruction      as a child   No family history on file. History  Substance Use Topics  . Smoking status: Former Smoker    Types: Cigarettes    Quit date: 08/12/2011  . Smokeless tobacco: Not on file  . Alcohol Use: 0.0 oz/week     Comment: occasion   OB History   Grav Para Term Preterm Abortions TAB SAB Ect Mult Living                 Review of Systems  Skin:       Abscess  All other systems reviewed and are negative.    Allergies  Ibuprofen and Azithromycin  Home Medications   Current Outpatient Rx  Name  Route  Sig  Dispense  Refill  . albuterol (PROVENTIL HFA;VENTOLIN HFA) 108 (90 BASE) MCG/ACT inhaler   Inhalation   Inhale 2 puffs into the lungs every 6 (six) hours as needed. For shortness of breath         . albuterol (PROVENTIL HFA;VENTOLIN HFA) 108 (90 BASE) MCG/ACT inhaler  Inhalation   Inhale 1-2 puffs into the lungs every 6 (six) hours as needed for wheezing.   1 Inhaler   0   . loratadine (CLARITIN) 10 MG tablet   Oral   Take 10 mg by mouth daily as needed for allergies.          . predniSONE (STERAPRED UNI-PAK) 10 MG tablet   Oral   Take 1 tablet (10 mg total) by mouth daily. Day 1: take 6 tabs.  Day 2: 5 tabs  Day 3: 4 tabs  Day 4: 3 tabs  Day 5: 2 tabs  Day 6: 1 tab   21 tablet   0    BP 160/91  Pulse 78  Temp(Src) 98.3 F (36.8 C) (Oral)  Resp 18  SpO2 99%  LMP 02/08/2013  Physical Exam  Nursing note and vitals reviewed. Constitutional: She is oriented to person, place, and time. She appears well-developed and well-nourished. No distress.  HENT:  Head: Normocephalic and atraumatic.  Eyes: EOM are normal.  Neck: Neck supple. No tracheal deviation present.  Cardiovascular: Normal rate.   Pulmonary/Chest: Effort normal. No respiratory distress.  Musculoskeletal: Normal range of motion.  Neurological: She is alert and oriented  to person, place, and time.  Skin: Skin is warm and dry.  Left axilla has an area of induration and fluctuant erythema over skin with moderate tenderness to palpation.  Psychiatric: She has a normal mood and affect. Her behavior is normal.    ED Course   Procedures (including critical care time)  DIAGNOSTIC STUDIES: Oxygen Saturation is 99% on RA, normal by my interpretation.    COORDINATION OF CARE: 3:43 PM-Discussed treatment plan which includes I&D with pt at bedside and pt agreed to plan. Will give pt an updated tetanus shot.   INCISION AND DRAINAGE Performed by: Fayrene Helper, PA-C Consent: Verbal consent obtained. Risks and benefits: risks, benefits and alternatives were discussed Type: abscess  Body area: left axilla  Anesthesia: local infiltration  Incision was made with a scalpel.  Local anesthetic: lidocaine 2% with epinephrine  Anesthetic total: 3 ml  Complexity: complex Blunt  dissection to break up loculations  Drainage: purulent  Drainage amount: mild  Packing material: none  Patient tolerance: Patient tolerated the procedure well with no immediate complications.   Labs Reviewed - No data to display No results found. 1. Abscess of axilla, left     MDM  BP 160/91  Pulse 78  Temp(Src) 98.3 F (36.8 C) (Oral)  Resp 18  SpO2 99%  LMP 02/08/2013   I personally performed the services described in this documentation, which was scribed in my presence. The recorded information has been reviewed and is accurate.    Fayrene Helper, PA-C 02/24/13 1601

## 2013-02-25 NOTE — ED Provider Notes (Signed)
Medical screening examination/treatment/procedure(s) were performed by non-physician practitioner and as supervising physician I was immediately available for consultation/collaboration.   Rosevelt Luu Y. Rissie Sculley, MD 02/25/13 1738 

## 2013-03-14 ENCOUNTER — Emergency Department (HOSPITAL_COMMUNITY): Payer: Medicaid Other

## 2013-03-14 ENCOUNTER — Emergency Department (HOSPITAL_COMMUNITY)
Admission: EM | Admit: 2013-03-14 | Discharge: 2013-03-14 | Disposition: A | Discharge status: Home / Self Care | Payer: Medicaid Other | Attending: Emergency Medicine | Admitting: Emergency Medicine

## 2013-03-14 ENCOUNTER — Encounter (HOSPITAL_COMMUNITY): Payer: Self-pay | Admitting: Emergency Medicine

## 2013-03-14 DIAGNOSIS — E669 Obesity, unspecified: Secondary | ICD-10-CM | POA: Insufficient documentation

## 2013-03-14 DIAGNOSIS — J45901 Unspecified asthma with (acute) exacerbation: Secondary | ICD-10-CM | POA: Insufficient documentation

## 2013-03-14 DIAGNOSIS — J3489 Other specified disorders of nose and nasal sinuses: Secondary | ICD-10-CM | POA: Insufficient documentation

## 2013-03-14 DIAGNOSIS — R079 Chest pain, unspecified: Secondary | ICD-10-CM | POA: Insufficient documentation

## 2013-03-14 DIAGNOSIS — Z87891 Personal history of nicotine dependence: Secondary | ICD-10-CM | POA: Insufficient documentation

## 2013-03-14 MED ORDER — ALBUTEROL (5 MG/ML) CONTINUOUS INHALATION SOLN
INHALATION_SOLUTION | RESPIRATORY_TRACT | Status: AC
  Filled 2013-03-14: qty 20

## 2013-03-14 MED ORDER — IPRATROPIUM BROMIDE 0.02 % IN SOLN
0.5000 mg | Freq: Once | RESPIRATORY_TRACT | Status: AC
  Administered 2013-03-14: 0.5 mg via RESPIRATORY_TRACT
  Filled 2013-03-14: qty 2.5

## 2013-03-14 MED ORDER — ALBUTEROL SULFATE 4 MG PO TABS: 4.0000 mg | ORAL_TABLET | Freq: Three times a day (TID) | ORAL | Status: DC

## 2013-03-14 MED ORDER — PREDNISONE 20 MG PO TABS
60.0000 mg | ORAL_TABLET | Freq: Once | ORAL | Status: AC
  Administered 2013-03-14: 60 mg via ORAL
  Filled 2013-03-14: qty 3

## 2013-03-14 MED ORDER — PREDNISONE 50 MG PO TABS: 50.0000 mg | ORAL_TABLET | Freq: Every day | ORAL | Status: DC

## 2013-03-14 MED ORDER — ALBUTEROL (5 MG/ML) CONTINUOUS INHALATION SOLN
10.0000 mg/h | INHALATION_SOLUTION | Freq: Once | RESPIRATORY_TRACT | Status: AC
  Administered 2013-03-14: 10 mg/h via RESPIRATORY_TRACT

## 2013-03-14 NOTE — Progress Notes (Signed)
Pt mentioned to CM she did not have money for medications Pt is pending medicaid application therefore Cm reviewed EPIC EDP note primary medications include claritin and albuterol inhaler no medication list Cm provided her with a [facility] $4 medication list (starring claritin generic and albuterol nebs) Cm reviewed with pt [facility] $[facility] list her, provided an application for albuterol (proventil) inhaler from [employer] patient assistance program and encouraged finding self pay pcp to have medications (especially albuterol inhaler) delivered to pcp office Pt appreciative of resources Provided with an extra blanket

## 2013-03-14 NOTE — Progress Notes (Addendum)
[facility] ED CM noted pt listed without coverage and pcp on face sheet. 7 ED visitis in last 6 months c/o cold, sob with hx asthma Spoke with pt who states she has "re applied for medicaid" but has not received a new medicaid card as of 03/14/13. Per copy of January 14 2012 medicaid card scanned in [place] pt was assigned to see Dr Fleet Contras.  She states she has not seen pcp "in awhile" and was wanting to change but has not informed DSS of her wish to change pcp CM encouraged her to contact DSS about the change desired if her medicaid is to be renewed.   Confirms self pay [employer] resident with no pcp. CM discussed and provided written information for self pay pcps, importance of pcp for f/u care, www.needymeds.org, discounted pharmacies and other [place] resources such as financial assistance, DSS and  health department  Reviewed resources for [employer] self pay pcps like [facility], family medicine at [address], [facility] family practice, general medical clinics, [facility] urgent care plus others, [facility] out patient pharmacies and housing Pt voiced understanding and appreciation of resources provided

## 2013-03-14 NOTE — ED Provider Notes (Addendum)
CSN: [id number]     Arrival date & time 03/14/13  0915 History   First MD Initiated Contact with Patient 03/14/13 [phone]     Chief Complaint  Patient presents with  . Asthma   (Consider location/radiation/quality/duration/timing/severity/associated sxs/prior Treatment) HPI Comments: SUBJECTIVE:  Anne Schaefer is a 42 y.o. female seen urgently with exacerbation of asthma for 1 day. Wheezing is described as moderate. Associated symptoms:congestion and productive cough. Current asthma medications: none. Patient admits to smoke cigarettes. Pt also admits to some pleuritic type chest pain, only when she coughs. No hemoptysis, and no hx of PE, DVT and no risk factors for the same.  Patient is a 42 y.o. female presenting with asthma. The history is provided by the patient.  Asthma Associated symptoms include chest pain and shortness of breath. Pertinent negatives include no abdominal pain and no headaches.    Past Medical History  Diagnosis Date  . Asthma   . Obesity   . Seasonal allergies    Past Surgical History  Procedure Laterality Date  . Tubal ligation    . Cesarean section    . Forehead reconstruction      as a child   No family history on file. History  Substance Use Topics  . Smoking status: Former Smoker    Types: Cigarettes    Quit date: 08/12/2011  . Smokeless tobacco: Not on file  . Alcohol Use: 0.0 oz/week     Comment: occasion   OB History   Grav Para Term Preterm Abortions TAB SAB Ect Mult Living                 Review of Systems  Constitutional: Negative for activity change.  HENT: Negative for neck pain.   Respiratory: Positive for cough, shortness of breath and wheezing.   Cardiovascular: Positive for chest pain.  Gastrointestinal: Negative for nausea, vomiting and abdominal pain.  Genitourinary: Negative for dysuria.  Neurological: Negative for headaches.    Allergies  Ibuprofen and Azithromycin  Home Medications   Current Outpatient Rx  Name   Route  Sig  Dispense  Refill  . albuterol (PROVENTIL HFA;VENTOLIN HFA) 108 (90 BASE) MCG/ACT inhaler   Inhalation   Inhale 2 puffs into the lungs every 6 (six) hours as needed for wheezing or shortness of breath.          BP 194/113  Pulse 87  Temp(Src) 98.1 F (36.7 C) (Oral)  Resp 20  SpO2 97%  LMP 03/08/2013 Physical Exam  Nursing note and vitals reviewed. Constitutional: She is oriented to person, place, and time. She appears well-developed and well-nourished.  HENT:  Head: Normocephalic and atraumatic.  Eyes: EOM are normal. Pupils are equal, round, and reactive to light.  Neck: Neck supple.  Cardiovascular: Normal rate, regular rhythm and normal heart sounds.   No murmur heard. Pulmonary/Chest: Effort normal. No respiratory distress.  Abdominal: Soft. She exhibits no distension. There is no tenderness. There is no rebound and no guarding.  Musculoskeletal: She exhibits no edema and no tenderness.  Neurological: She is alert and oriented to person, place, and time.  Skin: Skin is warm and dry.    ED Course  Procedures (including critical care time) Labs Review Labs Reviewed - No data to display Imaging Review Dg Chest 2 View  03/14/2013   *RADIOLOGY REPORT*  Clinical Data: Shortness of breath.  Chest pain.  Asthma.  CHEST - 2 VIEW  Comparison: 10/22/2012, 01/25/2012 and 07/22/2011.  Findings: Blunting right costophrenic angle unchanged.  Minimal peribronchial thickening and slight prominence of the right hilar structures stable.  No infiltrate, congestive heart failure or pneumothorax.  Heart size top normal.  IMPRESSION: Heart appears slightly more prominent than on prior exams. Remainder of findings without significant change as noted above.   Original Report Authenticated By: Lacy Duverney, M.D.    MDM  No diagnosis found.   Date: 03/14/2013  Rate: 91  Rhythm: normal sinus rhythm  QRS Axis: normal  Intervals: normal  ST/T Wave abnormalities: normal  Conduction  Disutrbances: none  Narrative Interpretation: unremarkable  Pt comes in with cc of asthma. Exam is consistent with asthma exacerbation. Post 1 hour treatment, symptoms improved - but wheezing still present. Will provide more treatments. Her BP is elevated - but besides chest pain, which is pleuritic, no headaches, no n/v/vision complains. Will repeat BP.    Derwood Kaplan, MD 03/14/13 1119  12:11 PM Improved exam.  The patient was counseled on the dangers of tobacco use, and was advised to quit.  Reviewed strategies to maximize success, including removing cigarettes and smoking materials from environment. Spoke for > 2 minutes.   Derwood Kaplan, MD 03/14/13 [phone]

## 2013-03-14 NOTE — ED Notes (Signed)
Pt states that she has been having cold like sx since yesterday.  Hx of asthma.  States that she woke up this morning c/o SOB.

## 2013-03-20 ENCOUNTER — Encounter (HOSPITAL_COMMUNITY): Payer: Self-pay | Admitting: General Surgery

## 2013-03-20 ENCOUNTER — Emergency Department (HOSPITAL_COMMUNITY)
Admission: EM | Admit: 2013-03-20 | Discharge: 2013-03-20 | Disposition: A | Discharge status: Home / Self Care | Payer: Self-pay | Attending: Emergency Medicine | Admitting: Emergency Medicine

## 2013-03-20 ENCOUNTER — Emergency Department (HOSPITAL_COMMUNITY): Payer: Medicaid Other

## 2013-03-20 DIAGNOSIS — R110 Nausea: Secondary | ICD-10-CM | POA: Insufficient documentation

## 2013-03-20 DIAGNOSIS — E669 Obesity, unspecified: Secondary | ICD-10-CM | POA: Insufficient documentation

## 2013-03-20 DIAGNOSIS — K645 Perianal venous thrombosis: Secondary | ICD-10-CM

## 2013-03-20 DIAGNOSIS — A599 Trichomoniasis, unspecified: Secondary | ICD-10-CM | POA: Insufficient documentation

## 2013-03-20 DIAGNOSIS — Z79899 Other long term (current) drug therapy: Secondary | ICD-10-CM | POA: Insufficient documentation

## 2013-03-20 DIAGNOSIS — K5900 Constipation, unspecified: Secondary | ICD-10-CM | POA: Insufficient documentation

## 2013-03-20 DIAGNOSIS — Z87891 Personal history of nicotine dependence: Secondary | ICD-10-CM | POA: Insufficient documentation

## 2013-03-20 DIAGNOSIS — Z3202 Encounter for pregnancy test, result negative: Secondary | ICD-10-CM | POA: Insufficient documentation

## 2013-03-20 DIAGNOSIS — IMO0002 Reserved for concepts with insufficient information to code with codable children: Secondary | ICD-10-CM | POA: Insufficient documentation

## 2013-03-20 DIAGNOSIS — J45909 Unspecified asthma, uncomplicated: Secondary | ICD-10-CM | POA: Insufficient documentation

## 2013-03-20 DIAGNOSIS — K6289 Other specified diseases of anus and rectum: Secondary | ICD-10-CM | POA: Insufficient documentation

## 2013-03-20 DIAGNOSIS — R109 Unspecified abdominal pain: Secondary | ICD-10-CM | POA: Insufficient documentation

## 2013-03-20 LAB — URINALYSIS, ROUTINE W REFLEX MICROSCOPIC
Glucose, UA: NEGATIVE mg/dL
Leukocytes, UA: NEGATIVE
Nitrite: NEGATIVE
Specific Gravity, Urine: 1.022 (ref 1.005–1.030)
pH: 6 (ref 5.0–8.0)

## 2013-03-20 LAB — WET PREP, GENITAL
Clue Cells Wet Prep HPF POC: NONE SEEN
Yeast Wet Prep HPF POC: NONE SEEN

## 2013-03-20 LAB — CBC WITH DIFFERENTIAL/PLATELET
Basophils Absolute: 0 10*3/uL (ref 0.0–0.1)
Basophils Relative: 0 % (ref 0–1)
Eosinophils Absolute: 0.1 10*3/uL (ref 0.0–0.7)
HCT: 26.4 % — ABNORMAL LOW (ref 36.0–46.0)
Hemoglobin: 8.3 g/dL — ABNORMAL LOW (ref 12.0–15.0)
Lymphocytes Relative: 17 % (ref 12–46)
MCHC: 31.4 g/dL (ref 30.0–36.0)
Monocytes Relative: 8 % (ref 3–12)
Neutro Abs: 9.9 10*3/uL — ABNORMAL HIGH (ref 1.7–7.7)
Neutrophils Relative %: 74 % (ref 43–77)
WBC: 13.4 10*3/uL — ABNORMAL HIGH (ref 4.0–10.5)

## 2013-03-20 LAB — BASIC METABOLIC PANEL
BUN: 11 mg/dL (ref 6–23)
Chloride: 103 mEq/L (ref 96–112)
GFR calc Af Amer: 85 mL/min — ABNORMAL LOW (ref >90)
GFR calc non Af Amer: 74 mL/min — ABNORMAL LOW (ref >90)
Potassium: 3.2 mEq/L — ABNORMAL LOW (ref 3.5–5.1)
Sodium: 140 mEq/L (ref 135–145)

## 2013-03-20 LAB — POCT PREGNANCY, URINE: Preg Test, Ur: NEGATIVE

## 2013-03-20 MED ORDER — HYDROMORPHONE HCL PF 1 MG/ML IJ SOLN
1.0000 mg | Freq: Once | INTRAMUSCULAR | Status: AC
  Administered 2013-03-20: 1 mg via INTRAVENOUS
  Filled 2013-03-20: qty 1

## 2013-03-20 MED ORDER — METRONIDAZOLE 500 MG PO TABS: 500.0000 mg | ORAL_TABLET | Freq: Two times a day (BID) | ORAL | Status: DC

## 2013-03-20 MED ORDER — IOHEXOL 300 MG/ML  SOLN
100.0000 mL | Freq: Once | INTRAMUSCULAR | Status: AC | PRN
  Administered 2013-03-20: 100 mL via INTRAVENOUS

## 2013-03-20 MED ORDER — ONDANSETRON HCL 4 MG/2ML IJ SOLN
4.0000 mg | Freq: Once | INTRAMUSCULAR | Status: AC
  Administered 2013-03-20: 4 mg via INTRAVENOUS
  Filled 2013-03-20: qty 2

## 2013-03-20 MED ORDER — IOHEXOL 300 MG/ML  SOLN
25.0000 mL | INTRAMUSCULAR | Status: AC
  Administered 2013-03-20: 25 mL via ORAL

## 2013-03-20 MED ORDER — OXYCODONE-ACETAMINOPHEN 5-325 MG PO TABS: 1.0000 | ORAL_TABLET | ORAL | Status: DC | PRN

## 2013-03-20 NOTE — Consult Note (Signed)
SHAQUIRA MOROZ August 27, 1970  [id number].   Requesting MD: Dr. Pricilla Loveless Chief Complaint/Reason for Consult: thrombosed hemorrhoid  HPI: This is a 42 yo female who presented to the [facility] with c/o rectal pain that started this morning.  She states she has never had a hemorrhoid before.  She denies constipation or prolonged sitting, but she does lifting at work.  She does complain of some chronic abdominal pain and nausea.  We have been asked to see her for her hemorrhoid.  Review of Systems: Please see HPI, otherwise all other systems are negative  No family history on file.  Past Medical History  Diagnosis Date  . Asthma   . Obesity   . Seasonal allergies     Past Surgical History  Procedure Laterality Date  . Tubal ligation    . Cesarean section    . Forehead reconstruction      as a child    Social History:  reports that she quit smoking about 19 months ago. Her smoking use included Cigarettes. She smoked 0.00 packs per day. She does not have any smokeless tobacco history on file. She reports that  drinks alcohol. She reports that she uses illicit drugs (Marijuana).  Allergies:  Allergies  Allergen Reactions  . Ibuprofen Shortness Of Breath  . Azithromycin Hives and Rash     (Not in a hospital admission)  Blood pressure 152/98, pulse 88, temperature 98.6 F (37 C), temperature source Oral, resp. rate 18, height 5\' 2"  (1.575 m), weight 235 lb (106.595 kg), last menstrual period 03/08/2013, SpO2 99.00%. Physical Exam: General: pleasant, obese black female who is laying in bed in NAD HEENT: head is normocephalic, atraumatic.  Sclera are noninjected.  PERRL.  Ears and nose without any masses or lesions.  Mouth is pink and moist Abd: soft, minimally tender in LLQ, ND, +BS, no masses, hernias, or organomegaly Rectal: external exam revealed a thrombosed hemorrhoid around 1 oclock.  This is hard and very tender. Skin: warm and dry with no masses, lesions, or rashes Psych:  A&Ox3 with an appropriate affect.    Results for orders placed during the hospital encounter of 03/20/13 (from the past 48 hour(s))  URINALYSIS, ROUTINE W REFLEX MICROSCOPIC     Status: None   Collection Time    03/20/13 11:19 AM      Result Value Range   Color, Urine YELLOW  YELLOW   APPearance CLEAR  CLEAR   Specific Gravity, Urine 1.022  1.005 - 1.030   pH 6.0  5.0 - 8.0   Glucose, UA NEGATIVE  NEGATIVE mg/dL   Hgb urine dipstick NEGATIVE  NEGATIVE   Bilirubin Urine NEGATIVE  NEGATIVE   Ketones, ur NEGATIVE  NEGATIVE mg/dL   Protein, ur NEGATIVE  NEGATIVE mg/dL   Urobilinogen, UA 0.2  0.0 - 1.0 mg/dL   Nitrite NEGATIVE  NEGATIVE   Leukocytes, UA NEGATIVE  NEGATIVE   Comment: MICROSCOPIC NOT DONE ON URINES WITH NEGATIVE PROTEIN, BLOOD, LEUKOCYTES, NITRITE, OR GLUCOSE <1000 mg/dL.  POCT PREGNANCY, URINE     Status: None   Collection Time    03/20/13 11:23 AM      Result Value Range   Preg Test, Ur NEGATIVE  NEGATIVE   Comment:            THE SENSITIVITY OF THIS     METHODOLOGY IS >24 mIU/mL  BASIC METABOLIC PANEL     Status: Abnormal   Collection Time    03/20/13 11:40 AM  Result Value Range   Sodium 140  135 - 145 mEq/L   Potassium 3.2 (*) 3.5 - 5.1 mEq/L   Chloride 103  96 - 112 mEq/L   CO2 25  19 - 32 mEq/L   Glucose, Bld 103 (*) 70 - 99 mg/dL   BUN 11  6 - 23 mg/dL   Creatinine, Ser 4.78  0.50 - 1.10 mg/dL   Calcium 9.2  8.4 - 29.5 mg/dL   GFR calc non Af Amer 74 (*) >90 mL/min   GFR calc Af Amer 85 (*) >90 mL/min   Comment: (NOTE)     The eGFR has been calculated using the CKD EPI equation.     This calculation has not been validated in all clinical situations.     eGFR's persistently <90 mL/min signify possible Chronic Kidney     Disease.  CBC WITH DIFFERENTIAL     Status: Abnormal   Collection Time    03/20/13 11:40 AM      Result Value Range   WBC 13.4 (*) 4.0 - 10.5 K/uL   RBC 3.86 (*) 3.87 - 5.11 MIL/uL   Hemoglobin 8.3 (*) 12.0 - 15.0 g/dL   HCT  62.1 (*) 30.8 - 46.0 %   MCV 68.4 (*) 78.0 - 100.0 fL   MCH 21.5 (*) 26.0 - 34.0 pg   MCHC 31.4  30.0 - 36.0 g/dL   RDW 65.7 (*) 84.6 - 96.2 %   Platelets 428 (*) 150 - 400 K/uL   Neutrophils Relative % 74  43 - 77 %   Lymphocytes Relative 17  12 - 46 %   Monocytes Relative 8  3 - 12 %   Eosinophils Relative 1  0 - 5 %   Basophils Relative 0  0 - 1 %   Neutro Abs 9.9 (*) 1.7 - 7.7 K/uL   Lymphs Abs 2.3  0.7 - 4.0 K/uL   Monocytes Absolute 1.1 (*) 0.1 - 1.0 K/uL   Eosinophils Absolute 0.1  0.0 - 0.7 K/uL   Basophils Absolute 0.0  0.0 - 0.1 K/uL   RBC Morphology POLYCHROMASIA PRESENT    WET PREP, GENITAL     Status: Abnormal   Collection Time    03/20/13 12:16 PM      Result Value Range   Yeast Wet Prep HPF POC NONE SEEN  NONE SEEN   Trich, Wet Prep FEW (*) NONE SEEN   Clue Cells Wet Prep HPF POC NONE SEEN  NONE SEEN   WBC, Wet Prep HPF POC TOO NUMEROUS TO COUNT (*) NONE SEEN   Ct Abdomen Pelvis W Contrast  03/20/2013   *RADIOLOGY REPORT*  Clinical Data: Right lower quadrant abdominal pain  CT ABDOMEN AND PELVIS WITH CONTRAST  Technique:  Multidetector CT imaging of the abdomen and pelvis was performed following the standard protocol during bolus administration of intravenous contrast.  Contrast: OMNIPAQUE IOHEXOL 300 MG/ML  SOLN  Comparison: 11/23/2012  Findings: Chronic right base linear and nodular subpleural scarring as before.  No new airspace process.  No pericardial or pleural effusion.  No hiatal hernia.  Abdomen:  Liver, gallbladder, biliary system, pancreas, spleen, adrenal glands, and kidneys are within normal limits for age and demonstrate no acute process.  Negative for bowel obstruction, dilatation, ileus, or free air.  No abdominal free fluid, fluid collection, hemorrhage, abscess, or adenopathy.  Negative for aneurysm.  Normal appendix demonstrated.  Pelvis:  Redemonstration of a right adnexal oval heterogeneous fat density mass measuring 5.3  x 4.6 cm, image 67  consistent with an ovarian dermoid.  Trace pelvic free fluid.  Uterus and left ovary normal in size.  Urinary bladder unremarkable.  Sigmoid diverticulosis present.  No inguinal abnormality or hernia. Negative for fluid collection, abscess, or adenopathy.  No acute abnormal osseous finding.  IMPRESSION: Negative for appendicitis or acute abnormality.  Right ovarian dermoid.  Sigmoid diverticulosis.   Original Report Authenticated By: Judie Petit. Miles Costain, M.D.       Assessment/Plan 1. Thrombosed external hemorrhoid 2. Trichomonas 3. Anemia  Plan: 1. Bedside I&D of thrombosed hemorrhoid complete.  She may go home and do sitz bathes.  She should follow up with [place] as needed.  Ok for [employer] home.   Cleopatra Sardo E 03/20/2013, 3:08 PM Pager: [phone]

## 2013-03-20 NOTE — ED Provider Notes (Signed)
CSN: [id number]     Arrival date & time 03/20/13  1047 History   First MD Initiated Contact with Patient 03/20/13 1118     No chief complaint on file.  (Consider location/radiation/quality/duration/timing/severity/associated sxs/prior Treatment) HPI Comments: 42 year old female presents with worsening abdominal pain over the past 2-3 days. York Spaniel it started gradually 2 days ago but acutely worsened today. She feels like that time she had "diverticulitis" last year. She states that the pain is constant, and is a throbbing pain. She also has a lot of pain in her rectal area that hurts worse when having a bowel movement. She's been constipated recently as well. She states she says she has known hemorrhoids and is not sure if they're worse now. She denies any fevers or chills. She has had nausea without vomiting today. She denies diarrhea. Denies any urinary or vaginal symptoms. States she has been told she has a right ovarian cyst was noted in the bladder removed yet. She states the pain is "severe".  The history is provided by the patient.    Past Medical History  Diagnosis Date  . Asthma   . Obesity   . Seasonal allergies    Past Surgical History  Procedure Laterality Date  . Tubal ligation    . Cesarean section    . Forehead reconstruction      as a child   No family history on file. History  Substance Use Topics  . Smoking status: Former Smoker    Types: Cigarettes    Quit date: 08/12/2011  . Smokeless tobacco: Not on file  . Alcohol Use: 0.0 oz/week     Comment: occasion   OB History   Grav Para Term Preterm Abortions TAB SAB Ect Mult Living                 Review of Systems  Constitutional: Negative for fever and chills.  Respiratory: Negative for cough and shortness of breath.   Cardiovascular: Negative for chest pain.  Gastrointestinal: Positive for nausea, abdominal pain, constipation and rectal pain. Negative for vomiting, diarrhea and blood in stool.  Genitourinary:  Negative for dysuria, vaginal bleeding, vaginal discharge and vaginal pain.  Musculoskeletal: Negative for back pain.  All other systems reviewed and are negative.    Allergies  Ibuprofen and Azithromycin  Home Medications   Current Outpatient Rx  Name  Route  Sig  Dispense  Refill  . albuterol (PROVENTIL) 4 MG tablet   Oral   Take 1 tablet (4 mg total) by mouth 3 (three) times daily.   60 tablet   0   . predniSONE (DELTASONE) 50 MG tablet   Oral   Take 1 tablet (50 mg total) by mouth daily.   5 tablet   0    BP 172/93  Pulse 98  Temp(Src) 98.6 F (37 C) (Oral)  Resp 18  Ht 5\' 2"  (1.575 m)  Wt 235 lb (106.595 kg)  BMI 42.97 kg/m2  SpO2 99%  LMP 03/08/2013 Physical Exam  Nursing note reviewed. Constitutional: She is oriented to person, place, and time. She appears well-developed and well-nourished.  HENT:  Head: Normocephalic and atraumatic.  Right Ear: External ear normal.  Left Ear: External ear normal.  Nose: Nose normal.  Mouth/Throat: Oropharynx is clear and moist.  Neck: Neck supple.  Cardiovascular: Normal rate, regular rhythm, normal heart sounds and intact distal pulses.   Pulmonary/Chest: Effort normal and breath sounds normal. She has no wheezes. She has no rales.  Abdominal: Soft.  She exhibits no distension. There is tenderness (worst in right lower quadrant) in the right lower quadrant and left lower quadrant.  Genitourinary: Rectal exam shows external hemorrhoid (tthrombosed hemorrhoid at about 7 o'clock). Uterus is tender (mild). Cervix exhibits no motion tenderness, no discharge and no friability. Right adnexum displays no tenderness. Left adnexum displays no tenderness. Vaginal discharge (mild) found.  Exam performed with the nurse present  Neurological: She is alert and oriented to person, place, and time.  Skin: Skin is warm and dry.    ED Course  Procedures (including critical care time) Labs Review Labs Reviewed  WET PREP, GENITAL -  Abnormal; Notable for the following:    Trich, Wet Prep FEW (*)    WBC, Wet Prep HPF POC TOO NUMEROUS TO COUNT (*)    All other components within normal limits  BASIC METABOLIC PANEL - Abnormal; Notable for the following:    Potassium 3.2 (*)    Glucose, Bld 103 (*)    GFR calc non Af Amer 74 (*)    GFR calc Af Amer 85 (*)    All other components within normal limits  CBC WITH DIFFERENTIAL - Abnormal; Notable for the following:    WBC 13.4 (*)    RBC 3.86 (*)    Hemoglobin 8.3 (*)    HCT 26.4 (*)    MCV 68.4 (*)    MCH 21.5 (*)    RDW 17.4 (*)    Platelets 428 (*)    Neutro Abs 9.9 (*)    Monocytes Absolute 1.1 (*)    All other components within normal limits  GC/CHLAMYDIA PROBE AMP  URINALYSIS, ROUTINE W REFLEX MICROSCOPIC  POCT PREGNANCY, URINE   Imaging Review Ct Abdomen Pelvis W Contrast  03/20/2013   *RADIOLOGY REPORT*  Clinical Data: Right lower quadrant abdominal pain  CT ABDOMEN AND PELVIS WITH CONTRAST  Technique:  Multidetector CT imaging of the abdomen and pelvis was performed following the standard protocol during bolus administration of intravenous contrast.  Contrast: OMNIPAQUE IOHEXOL 300 MG/ML  SOLN  Comparison: 11/23/2012  Findings: Chronic right base linear and nodular subpleural scarring as before.  No new airspace process.  No pericardial or pleural effusion.  No hiatal hernia.  Abdomen:  Liver, gallbladder, biliary system, pancreas, spleen, adrenal glands, and kidneys are within normal limits for age and demonstrate no acute process.  Negative for bowel obstruction, dilatation, ileus, or free air.  No abdominal free fluid, fluid collection, hemorrhage, abscess, or adenopathy.  Negative for aneurysm.  Normal appendix demonstrated.  Pelvis:  Redemonstration of a right adnexal oval heterogeneous fat density mass measuring 5.3 x 4.6 cm, image 67 consistent with an ovarian dermoid.  Trace pelvic free fluid.  Uterus and left ovary normal in size.  Urinary bladder  unremarkable.  Sigmoid diverticulosis present.  No inguinal abnormality or hernia. Negative for fluid collection, abscess, or adenopathy.  No acute abnormal osseous finding.  IMPRESSION: Negative for appendicitis or acute abnormality.  Right ovarian dermoid.  Sigmoid diverticulosis.   Original Report Authenticated By: Judie Petit. Miles Costain, M.D.    MDM   1. Thrombosed external hemorrhoid   2. Abdominal pain   3. Trichomonas    Abd pain resolved after 1 dose of narcotics. 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 Doubt torsion as her pain is resolved. No signs of appendicitis. Surgery consulted for her thrombosed hemorrhoid, they incised at bedside.  Will treat with pain meds, sitz baths and symptomatic care. Will also treat for trich seen on wet prep.    Audree Camel, MD 03/20/13 [phone]

## 2013-03-20 NOTE — Procedures (Signed)
Incision and Drainage Procedure Note  Pre-operative Diagnosis: thrombosed external hemorrhoid  Post-operative Diagnosis: same  Indications: This is a 42 yo female who came to the [facility] today with a c/o rectal pain and was found to have a thrombosed hemorrhoid.  Anesthesia: 2% plain lidocaine  Procedure Details  The procedure, risks and complications have been discussed in detail (including, but not limited to bleeding and infection verbal consent was obtained prior to the procedure.  The skin was sterilely prepped and draped over the affected area in the usual fashion. After adequate local anesthesia, I&D with a #11 blade was performed on the rectal hemorrhoid. Purulent drainage: absent, but a large clot was evacuated. The patient was observed until stable.  Findings: Thrombosed hemorrhoid  EBL: 10 cc's  Drains: none  Condition: Tolerated procedure well and Stable   Complications: none.

## 2013-03-20 NOTE — ED Notes (Signed)
Patient has 4x4 placed between her buttock cheeks for clotting. 4x4 were saturated and patient needed to urinate. Bed pan was used and 4x4's changed.

## 2013-03-20 NOTE — Consult Note (Signed)
I have seen and examined the patient and agree with the assessment and plans.  Kayonna Lawniczak A. Kesley Mullens  MD, FACS  

## 2013-04-01 ENCOUNTER — Encounter (HOSPITAL_COMMUNITY): Payer: Self-pay | Admitting: *Deleted

## 2013-04-01 ENCOUNTER — Inpatient Hospital Stay (HOSPITAL_COMMUNITY): Payer: Medicaid Other

## 2013-04-01 ENCOUNTER — Inpatient Hospital Stay (HOSPITAL_COMMUNITY)
Admission: AD | Admit: 2013-04-01 | Discharge: 2013-04-01 | Disposition: A | Discharge status: Home / Self Care | Payer: Medicaid Other | Source: Ambulatory Visit | Attending: Obstetrics & Gynecology | Admitting: Obstetrics & Gynecology

## 2013-04-01 DIAGNOSIS — D279 Benign neoplasm of unspecified ovary: Secondary | ICD-10-CM | POA: Insufficient documentation

## 2013-04-01 DIAGNOSIS — D259 Leiomyoma of uterus, unspecified: Secondary | ICD-10-CM | POA: Insufficient documentation

## 2013-04-01 DIAGNOSIS — N730 Acute parametritis and pelvic cellulitis: Secondary | ICD-10-CM

## 2013-04-01 DIAGNOSIS — N739 Female pelvic inflammatory disease, unspecified: Secondary | ICD-10-CM | POA: Insufficient documentation

## 2013-04-01 DIAGNOSIS — D509 Iron deficiency anemia, unspecified: Secondary | ICD-10-CM | POA: Insufficient documentation

## 2013-04-01 DIAGNOSIS — R109 Unspecified abdominal pain: Secondary | ICD-10-CM | POA: Insufficient documentation

## 2013-04-01 HISTORY — DX: Unspecified ovarian cyst, unspecified side: N83.209

## 2013-04-01 HISTORY — DX: Diverticulitis of intestine, part unspecified, without perforation or abscess without bleeding: K57.92

## 2013-04-01 HISTORY — DX: Unspecified infectious disease: B99.9

## 2013-04-01 LAB — URINALYSIS, ROUTINE W REFLEX MICROSCOPIC
Ketones, ur: NEGATIVE mg/dL
Specific Gravity, Urine: >1.03 — ABNORMAL HIGH (ref 1.005–1.030)
pH: 6 (ref 5.0–8.0)

## 2013-04-01 LAB — CBC WITH DIFFERENTIAL/PLATELET
Basophils Absolute: 0 10*3/uL (ref 0.0–0.1)
Eosinophils Absolute: 0.1 10*3/uL (ref 0.0–0.7)
Eosinophils Relative: 1 % (ref 0–5)
Hemoglobin: 8.9 g/dL — ABNORMAL LOW (ref 12.0–15.0)
Lymphocytes Relative: 11 % — ABNORMAL LOW (ref 12–46)
Lymphs Abs: 1.5 10*3/uL (ref 0.7–4.0)
MCH: 20.8 pg — ABNORMAL LOW (ref 26.0–34.0)
MCV: 67.2 fL — ABNORMAL LOW (ref 78.0–100.0)
Monocytes Relative: 10 % (ref 3–12)
Neutro Abs: 10.1 10*3/uL — ABNORMAL HIGH (ref 1.7–7.7)
Platelets: 384 10*3/uL (ref 150–400)
RBC: 4.27 MIL/uL (ref 3.87–5.11)

## 2013-04-01 LAB — COMPREHENSIVE METABOLIC PANEL
AST: 13 U/L (ref 0–37)
Alkaline Phosphatase: 50 U/L (ref 39–117)
BUN: 7 mg/dL (ref 6–23)
CO2: 26 mEq/L (ref 19–32)
GFR calc Af Amer: >90 mL/min (ref >90)
GFR calc non Af Amer: 84 mL/min — ABNORMAL LOW (ref >90)
Glucose, Bld: 80 mg/dL (ref 70–99)
Potassium: 3.7 mEq/L (ref 3.5–5.1)
Total Bilirubin: 0.2 mg/dL — ABNORMAL LOW (ref 0.3–1.2)
Total Protein: 6.6 g/dL (ref 6.0–8.3)

## 2013-04-01 LAB — URINE MICROSCOPIC-ADD ON

## 2013-04-01 LAB — WET PREP, GENITAL
Trich, Wet Prep: NONE SEEN
Yeast Wet Prep HPF POC: NONE SEEN

## 2013-04-01 LAB — POCT PREGNANCY, URINE: Preg Test, Ur: NEGATIVE

## 2013-04-01 MED ORDER — CEFTRIAXONE SODIUM 250 MG IJ SOLR
250.0000 mg | Freq: Once | INTRAMUSCULAR | Status: AC
  Administered 2013-04-01: 250 mg via INTRAMUSCULAR
  Filled 2013-04-01: qty 250

## 2013-04-01 MED ORDER — DOXYCYCLINE HYCLATE 100 MG PO CAPS: 100.0000 mg | ORAL_CAPSULE | Freq: Two times a day (BID) | ORAL | Status: DC

## 2013-04-01 MED ORDER — HYDROMORPHONE HCL PF 1 MG/ML IJ SOLN
1.0000 mg | Freq: Once | INTRAMUSCULAR | Status: AC
  Administered 2013-04-01: 1 mg via INTRAMUSCULAR
  Filled 2013-04-01: qty 1

## 2013-04-01 MED ORDER — OXYCODONE-ACETAMINOPHEN 5-325 MG PO TABS: 1.0000 | ORAL_TABLET | ORAL | Status: DC | PRN

## 2013-04-01 NOTE — MAU Provider Note (Signed)
History     CSN: [id number]  Arrival date and time: 04/01/13 1052   None     No chief complaint on file.  HPI  [name] is a 42 y.o. female [phone] non-pregnant who presents to MAU with abdominal pain; she has a history of chronic abdominal pain. She was seen at [facility] ED on 9/9 and had a CT scan done there that showed a 5 cm dermoid cyst. She was told to follow up in a few weeks and the patient did not follow up anywhere do to her work schedule. Today the abdominal pain is all across her lower abdomen, she feels like it is labor pains. She has not had any problems with passing stool, this morning when she was having a bowel movement she felt the urge to push like she was "pushing a baby out". She took some pain medicine that was left over from her last ER visit with minimal relief. She denies N/V or fever. She is currently on her menstrual cycle.   OB History   Grav Para Term Preterm Abortions TAB SAB Ect Mult Living   5 5 4 1  0 0 0 0 0 5      Past Medical History  Diagnosis Date  . Asthma   . Obesity   . Seasonal allergies   . Diverticulitis   . Ovarian cyst   . Infection     trich    Past Surgical History  Procedure Laterality Date  . Tubal ligation    . Cesarean section    . Forehead reconstruction      as a child    Family History  Problem Relation Age of Onset  . Diabetes Mother   . Heart disease Mother     chf  . Hypertension Father     History  Substance Use Topics  . Smoking status: Current Some Day Smoker    Types: Cigarettes  . Smokeless tobacco: Never Used  . Alcohol Use: 0.0 oz/week     Comment: occasion    Allergies:  Allergies  Allergen Reactions  . Ibuprofen Shortness Of Breath  . Azithromycin Hives and Rash    Prescriptions prior to admission  Medication Sig Dispense Refill  . acetaminophen (TYLENOL) 500 MG tablet Take 1,000 mg by mouth every 6 (six) hours as needed for pain.      Marland Kitchen albuterol (PROVENTIL) 4 MG tablet  Take 1 tablet (4 mg total) by mouth 3 (three) times daily.  60 tablet  0  . oxyCODONE-acetaminophen (PERCOCET) 5-325 MG per tablet Take 1-2 tablets by mouth every 4 (four) hours as needed for pain.  20 tablet  0   Results for orders placed during the hospital encounter of 04/01/13 (from the past 24 hour(s))  URINALYSIS, ROUTINE W REFLEX MICROSCOPIC     Status: Abnormal   Collection Time    04/01/13 11:25 AM      Result Value Range   Color, Urine YELLOW  YELLOW   APPearance CLEAR  CLEAR   Specific Gravity, Urine >1.030 (*) 1.005 - 1.030   pH 6.0  5.0 - 8.0   Glucose, UA NEGATIVE  NEGATIVE mg/dL   Hgb urine dipstick LARGE (*) NEGATIVE   Bilirubin Urine SMALL (*) NEGATIVE   Ketones, ur NEGATIVE  NEGATIVE mg/dL   Protein, ur NEGATIVE  NEGATIVE mg/dL   Urobilinogen, UA 0.2  0.0 - 1.0 mg/dL   Nitrite NEGATIVE  NEGATIVE   Leukocytes, UA NEGATIVE  NEGATIVE  URINE MICROSCOPIC-ADD ON     Status: Abnormal   Collection Time    04/01/13 11:25 AM      Result Value Range   Squamous Epithelial / LPF FEW (*) RARE   RBC / HPF TOO NUMEROUS TO COUNT  <3 RBC/hpf   Bacteria, UA RARE  RARE   Urine-Other MUCOUS PRESENT    POCT PREGNANCY, URINE     Status: None   Collection Time    04/01/13 12:16 PM      Result Value Range   Preg Test, Ur NEGATIVE  NEGATIVE  CBC WITH DIFFERENTIAL     Status: Abnormal   Collection Time    04/01/13 12:31 PM      Result Value Range   WBC 13.0 (*) 4.0 - 10.5 K/uL   RBC 4.27  3.87 - 5.11 MIL/uL   Hemoglobin 8.9 (*) 12.0 - 15.0 g/dL   HCT 16.1 (*) 09.6 - 04.5 %   MCV 67.2 (*) 78.0 - 100.0 fL   MCH 20.8 (*) 26.0 - 34.0 pg   MCHC 31.0  30.0 - 36.0 g/dL   RDW 40.9 (*) 81.1 - 91.4 %   Platelets 384  150 - 400 K/uL   Neutrophils Relative % 78 (*) 43 - 77 %   Neutro Abs 10.1 (*) 1.7 - 7.7 K/uL   Lymphocytes Relative 11 (*) 12 - 46 %   Lymphs Abs 1.5  0.7 - 4.0 K/uL   Monocytes Relative 10  3 - 12 %   Monocytes Absolute 1.2 (*) 0.1 - 1.0 K/uL   Eosinophils Relative 1   0 - 5 %   Eosinophils Absolute 0.1  0.0 - 0.7 K/uL   Basophils Relative 0  0 - 1 %   Basophils Absolute 0.0  0.0 - 0.1 K/uL  COMPREHENSIVE METABOLIC PANEL     Status: Abnormal   Collection Time    04/01/13 12:31 PM      Result Value Range   Sodium 139  135 - 145 mEq/L   Potassium 3.7  3.5 - 5.1 mEq/L   Chloride 104  96 - 112 mEq/L   CO2 26  19 - 32 mEq/L   Glucose, Bld 80  70 - 99 mg/dL   BUN 7  6 - 23 mg/dL   Creatinine, Ser 7.82  0.50 - 1.10 mg/dL   Calcium 9.2  8.4 - 95.6 mg/dL   Total Protein 6.6  6.0 - 8.3 g/dL   Albumin 3.5  3.5 - 5.2 g/dL   AST 13  0 - 37 U/L   ALT 14  0 - 35 U/L   Alkaline Phosphatase 50  39 - 117 U/L   Total Bilirubin 0.2 (*) 0.3 - 1.2 mg/dL   GFR calc non Af Amer 84 (*) >90 mL/min   GFR calc Af Amer >90  >90 mL/min  WET PREP, GENITAL     Status: Abnormal   Collection Time    04/01/13 12:50 PM      Result Value Range   Yeast Wet Prep HPF POC NONE SEEN  NONE SEEN   Trich, Wet Prep NONE SEEN  NONE SEEN   Clue Cells Wet Prep HPF POC NONE SEEN  NONE SEEN   WBC, Wet Prep HPF POC FEW (*) NONE SEEN   US Transvaginal Non-ob  04/01/2013   CLINICAL DATA:  Right lower quadrant pain, known right ovarian dermoid, evaluate for torsion  EXAM: TRANSABDOMINAL AND TRANSVAGINAL ULTRASOUND OF PELVIS  DOPPLER ULTRASOUND OF OVARIES  TECHNIQUE: Both transabdominal and transvaginal ultrasound examinations of the pelvis were performed. Transabdominal technique was performed for global imaging of the pelvis including uterus, ovaries, adnexal regions, and pelvic cul-de-sac.  It was necessary to proceed with endovaginal exam following the transabdominal exam to visualize the endometrium. Color and duplex Doppler ultrasound was utilized to evaluate blood flow to the ovaries.  COMPARISON:  CT abdomen pelvis dated 03/20/2013  FINDINGS: Uterus  Measurements: 11.5 x 6.5 x 6.9 cm. 2.7 x 2.6 x 2.6 cm left fundal fibroid.  Endometrium  Thickness: 8 mm.  No focal abnormality visualized.  Right  ovary  Measurements: Enlarged, measuring 7.3 x 4.5 x 6.3 cm. Notable for a 6.1 x 4.5 x 6.3 cm hyperechoic solid mass, compatible with known dermoid.  Left ovary  Measurements: 3.2 x 2.1 x 1.7 cm. Normal appearance/no adnexal mass.  Pulsed Doppler evaluation of both ovaries demonstrates normal low-resistance arterial and venous waveforms.  IMPRESSION: 6.3 cm right ovarian dermoid.  2.7 cm left fundal fibroid.  No sonographic evidence for ovarian torsion.   Electronically Signed   By: Charline Bills M.D.   On: 04/01/2013 14:49   US Pelvis Complete  04/01/2013   CLINICAL DATA:  Right lower quadrant pain, known right ovarian dermoid, evaluate for torsion  EXAM: TRANSABDOMINAL AND TRANSVAGINAL ULTRASOUND OF PELVIS  DOPPLER ULTRASOUND OF OVARIES  TECHNIQUE: Both transabdominal and transvaginal ultrasound examinations of the pelvis were performed. Transabdominal technique was performed for global imaging of the pelvis including uterus, ovaries, adnexal regions, and pelvic cul-de-sac.  It was necessary to proceed with endovaginal exam following the transabdominal exam to visualize the endometrium. Color and duplex Doppler ultrasound was utilized to evaluate blood flow to the ovaries.  COMPARISON:  CT abdomen pelvis dated 03/20/2013  FINDINGS: Uterus  Measurements: 11.5 x 6.5 x 6.9 cm. 2.7 x 2.6 x 2.6 cm left fundal fibroid.  Endometrium  Thickness: 8 mm.  No focal abnormality visualized.  Right ovary  Measurements: Enlarged, measuring 7.3 x 4.5 x 6.3 cm. Notable for a 6.1 x 4.5 x 6.3 cm hyperechoic solid mass, compatible with known dermoid.  Left ovary  Measurements: 3.2 x 2.1 x 1.7 cm. Normal appearance/no adnexal mass.  Pulsed Doppler evaluation of both ovaries demonstrates normal low-resistance arterial and venous waveforms.  IMPRESSION: 6.3 cm right ovarian dermoid.  2.7 cm left fundal fibroid.  No sonographic evidence for ovarian torsion.   Electronically Signed   By: Charline Bills M.D.   On: 04/01/2013 14:49    [place] Art/ven Flow Abd Pelv Doppler  04/01/2013   CLINICAL DATA:  Right lower quadrant pain, known right ovarian dermoid, evaluate for torsion  EXAM: TRANSABDOMINAL AND TRANSVAGINAL ULTRASOUND OF PELVIS  DOPPLER ULTRASOUND OF OVARIES  TECHNIQUE: Both transabdominal and transvaginal ultrasound examinations of the pelvis were performed. Transabdominal technique was performed for global imaging of the pelvis including uterus, ovaries, adnexal regions, and pelvic cul-de-sac.  It was necessary to proceed with endovaginal exam following the transabdominal exam to visualize the endometrium. Color and duplex Doppler ultrasound was utilized to evaluate blood flow to the ovaries.  COMPARISON:  CT abdomen pelvis dated 03/20/2013  FINDINGS: Uterus  Measurements: 11.5 x 6.5 x 6.9 cm. 2.7 x 2.6 x 2.6 cm left fundal fibroid.  Endometrium  Thickness: 8 mm.  No focal abnormality visualized.  Right ovary  Measurements: Enlarged, measuring 7.3 x 4.5 x 6.3 cm. Notable for a 6.1 x 4.5 x 6.3 cm hyperechoic solid mass, compatible with known dermoid.  Left ovary  Measurements: 3.2 x 2.1 x 1.7 cm. Normal appearance/no adnexal mass.  Pulsed Doppler evaluation of both ovaries demonstrates normal low-resistance arterial and venous waveforms.  IMPRESSION: 6.3 cm right ovarian dermoid.  2.7 cm left fundal fibroid.  No sonographic evidence for ovarian torsion.   Electronically Signed   By: Charline Bills M.D.   On: 04/01/2013 14:49    Review of Systems  Constitutional: Negative for fever and chills.  Eyes: Negative for blurred vision.  Gastrointestinal: Positive for abdominal pain. Negative for nausea, vomiting, diarrhea and constipation.       + Bilateral lower abdominal pain   Genitourinary: Negative for dysuria, urgency, frequency and hematuria.       No vaginal discharge. + vaginal bleeding; pt currently on her menstrual cycle No dysuria.    Physical Exam   Blood pressure 131/69, pulse 77, temperature 98.1 F (36.7 C),  temperature source Oral, resp. rate 18, height 5' 2.5" (1.588 m), weight 105.688 kg (233 lb), last menstrual period 03/31/2013.  Physical Exam  Constitutional: She is oriented to person, place, and time. She appears well-developed and well-nourished. No distress.  Neck: Neck supple.  Respiratory: Effort normal.  GI: Soft. She exhibits no distension. There is tenderness. There is guarding. There is no rebound.  Left and right lower quadrant tenderness on exam.   Genitourinary:  Speculum exam: Vagina - moderate amount of bright red vaginal blood in vaginal canal; a few small clots, no odor Cervix - moderate contact bleeding Bimanual exam: Cervix closed, moderate-severe CMT on exam  Uterus tender, normal size Bilateral Adnexal tenderness, no masses bilaterally GC/Chlam, wet prep done Chaperone present for exam.   Neurological: She is alert and oriented to person, place, and time.  Skin: Skin is warm and dry. She is not diaphoretic.  Psychiatric: Her behavior is normal.    MAU Course  Procedures  MDM CBC CMET Wet prep GC/Chlamydia Complete pelvic US to r/o torsion  Consulted with Dr. Marice Potter at 1325  1 mg dilaudid IM  Ceftriaxone 250 mg IM in MAU I Would prefer Azithromycin over Doxycycline due to cost; given the patients allergy to azithromycin I will prescribe Doxycycline. Pt does not get paid until next Friday and is concerned whether she can afford the medication; I recommended filling as many pills as she can and that she may have to pick up a 1/2 the prescription until she gets paid. If she cannot fill it at all she will need to contact the clinic or MAU for further resources.   Assessment and Plan  A: Right ovarian dermoid  Left fundal fibroid  Pelvic inflammatory disease  Iron deficiency anemia    P: Discharge home Referral made to the clinic for follow up; the clinic will call her to schedule appointment; If they patient does not hear from the clinic she will need  to call them with the phone number that was provided RX: Doxycycline 100 mg BID times 14 days (#28) no rf         Percocet 5/325 mg PO Q4-6 hours as needed for pain (#20) no rf Return to MAU as needed; if symptoms worsen No intercourse until full course of PID treatment completed  Start Iron 300 mg BID daily; may need to take over the counter stool softener  RASCH, JENNIFER IRENE FNP-C 04/01/2013, 4:36 PM

## 2013-04-01 NOTE — MAU Note (Addendum)
abd pain over a wk ago- thought it was from the divertiulitis; went to [facility], dx with trich- took the medications but the pain has gotten worse. Had CT was dx with an ovarian cyst. Lots of pressure, feels like pressure in labor

## 2013-04-09 ENCOUNTER — Ambulatory Visit (INDEPENDENT_AMBULATORY_CARE_PROVIDER_SITE_OTHER): Payer: Medicaid Other | Admitting: Obstetrics & Gynecology

## 2013-04-09 ENCOUNTER — Encounter: Payer: Self-pay | Admitting: Obstetrics & Gynecology

## 2013-04-09 VITALS — BP 142/90 | HR 80 | Ht 62.0 in | Wt 234.0 lb

## 2013-04-09 DIAGNOSIS — N730 Acute parametritis and pelvic cellulitis: Secondary | ICD-10-CM | POA: Insufficient documentation

## 2013-04-09 NOTE — Progress Notes (Signed)
Patient ID: Anne Schaefer, female   DOB: [birth year], 42 y.o.   MRN: [id number]  Chief Complaint  Patient presents with  . Dermoid Cyst  . Pelvic Inflammatory Disease  . Fibroids    HPI Anne Schaefer is a 42 y.o. female.  [id number] Patient's last menstrual period was 03/31/2013.  Treated for PID last week, still on her ABX. 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 H/O diverticulitis HPI  Past Medical History  Diagnosis Date  . Asthma   . Obesity   . Seasonal allergies   . Diverticulitis   . Ovarian cyst   . Infection     trich    Past Surgical History  Procedure Laterality Date  . Tubal ligation    . Cesarean section    . Forehead reconstruction      as a child    Family History  Problem Relation Age of Onset  . Diabetes Mother   . Heart disease Mother     chf  . Hypertension Father     Social History History  Substance Use Topics  . Smoking status: Current Some Day Smoker    Types: Cigarettes  . Smokeless tobacco: Never Used  . Alcohol Use: 0.0 oz/week     Comment: occasion    Allergies  Allergen Reactions  . Ibuprofen Shortness Of Breath  . Azithromycin Hives and Rash    Current Outpatient Prescriptions  Medication Sig Dispense Refill  . acetaminophen (TYLENOL) 500 MG tablet Take 1,000 mg by mouth every 6 (six) hours as needed for pain.      Marland Kitchen albuterol (PROVENTIL) 4 MG tablet Take 1 tablet (4 mg total) by mouth 3 (three) times daily.  60 tablet  0  . doxycycline (VIBRAMYCIN) 100 MG capsule Take 1 capsule (100 mg total) by mouth 2 (two) times daily.  28 capsule  0  . oxyCODONE-acetaminophen (PERCOCET/ROXICET) 5-325 MG per tablet Take 1-2 tablets by mouth every 4 (four) hours as needed for pain.  30 tablet  0  . oxyCODONE-acetaminophen (PERCOCET) 5-325 MG per tablet Take 1-2 tablets by mouth every 4 (four) hours as needed for pain.  20 tablet  0   No current facility-administered medications for this visit.    Review of Systems Review of Systems   Constitutional: Negative for fever.  Gastrointestinal: Negative for nausea, constipation and abdominal distention.  Genitourinary: Positive for pelvic pain. Negative for vaginal bleeding and vaginal discharge.    Blood pressure 142/90, pulse 80, height 5\' 2"  (1.575 m), weight 234 lb (106.142 kg), last menstrual period 03/31/2013.  Physical Exam Physical Exam  Constitutional: She is oriented to person, place, and time. No distress.  obese  Abdominal: Soft. She exhibits no mass. There is no tenderness.  Genitourinary: Vagina normal. No vaginal discharge found.  No palpable mass, mild CMT  Neurological: She is alert and oriented to person, place, and time.  Skin: Skin is warm and dry.  Psychiatric: She has a normal mood and affect. Her behavior is normal.    Data Reviewed Study Result    CLINICAL DATA: Right lower quadrant pain, known right ovarian  dermoid, evaluate for torsion  EXAM:  TRANSABDOMINAL AND TRANSVAGINAL ULTRASOUND OF PELVIS  DOPPLER ULTRASOUND OF OVARIES  TECHNIQUE:  Both transabdominal and transvaginal ultrasound examinations of the  pelvis were performed. Transabdominal technique was performed for  global imaging of the pelvis including uterus, ovaries, adnexal  regions, and pelvic cul-de-sac.  It was necessary to proceed with endovaginal exam following the  transabdominal exam to visualize the endometrium. Color and duplex  Doppler ultrasound was utilized to evaluate blood flow to the  ovaries.  COMPARISON: CT abdomen pelvis dated 03/20/2013  FINDINGS:  Uterus  Measurements: 11.5 x 6.5 x 6.9 cm. 2.7 x 2.6 x 2.6 cm left fundal  fibroid.  Endometrium  Thickness: 8 mm. No focal abnormality visualized.  Right ovary  Measurements: Enlarged, measuring 7.3 x 4.5 x 6.3 cm. Notable for a  6.1 x 4.5 x 6.3 cm hyperechoic solid mass, compatible with known  dermoid.  Left ovary  Measurements: 3.2 x 2.1 x 1.7 cm. Normal appearance/no adnexal mass.  Pulsed Doppler  evaluation of both ovaries demonstrates normal  low-resistance arterial and venous waveforms.  IMPRESSION:  6.3 cm right ovarian dermoid.  2.7 cm left fundal fibroid.  No sonographic evidence for ovarian torsion.  Electronically Signed  By: Charline Bills M.D.  On: 04/01/2013 14:49      Assessment    PID treated partially Right dermoid     Plan    Finish antibiotic, RTC for evaluation for laparoscopy for dermoid, working on her Medicaid coverage.       Rylynne Schicker 04/09/2013, 4:25 PM

## 2013-04-09 NOTE — Patient Instructions (Signed)
Pelvic Inflammatory Disease °Pelvic inflammatory disease (PID) refers to an infection in some or all of the female organs. The infection can be in the uterus, ovaries, fallopian tubes, or the surrounding tissues in the pelvis. PID can cause abdominal or pelvic pain that comes on suddenly (acute pelvic pain). PID is a serious infection because it can lead to lasting (chronic) pelvic pain or the inability to have children (infertile).  °CAUSES  °The infection is often caused by the normal bacteria found in the vaginal tissues. PID may also be caused by an infection that is spread during sexual contact. PID can also occur following:  °· The birth of a baby.   °· A miscarriage.   °· An abortion.   °· Major pelvic surgery.   °· The use of an intrauterine device (IUD).   °· A sexual assault.   °RISK FACTORS °Certain factors can put a person at higher risk for PID, such as: °· Being younger than 25 years. °· Being sexually active at a young age. °· Using nonbarrier contraception. °· Having multiple sexual partners. °· Having sex with someone who has symptoms of a genital infection. °· Using oral contraception. °Other times, certain behaviors can increase the possibility of getting PID, such as: °· Having sex during your period. °· Using a vaginal douche. °· Having an intrauterine device (IUD) in place. °SYMPTOMS  °· Abdominal or pelvic pain.   °· Fever.   °· Chills.   °· Abnormal vaginal discharge. °· Abnormal uterine bleeding.   °· Unusual pain shortly after finishing your period. °DIAGNOSIS  °Your caregiver will choose some of the following methods to make a diagnosis, such as:  °· Performing a physical exam and history. A pelvic exam typically reveals a very tender uterus and surrounding pelvis.   °· Ordering laboratory tests including a pregnancy test, blood tests, and urine test.  °· Ordering cultures of the vagina and cervix to check for a sexually transmitted infection (STI). °· Performing an ultrasound.    °· Performing a laparoscopic procedure to look inside the pelvis.   °TREATMENT  °· Antibiotic medicines may be prescribed and taken by mouth.   °· Sexual partners may be treated when the infection is caused by a sexually transmitted disease (STD).   °· Hospitalization may be needed to give antibiotics intravenously. °· Surgery may be needed, but this is rare. °It may take weeks until you are completely well. If you are diagnosed with PID, you should also be checked for human immunodeficiency virus (HIV).   °HOME CARE INSTRUCTIONS  °· If given, take your antibiotics as directed. Finish the medicine even if you start to feel better.   °· Only take over-the-counter or prescription medicines for pain, discomfort, or fever as directed by your caregiver.   °· Do not have sexual intercourse until treatment is completed or as directed by your caregiver. If PID is confirmed, your recent sexual partner(s) will need treatment.   °· Keep your follow-up appointments. °SEEK MEDICAL CARE IF:  °· You have increased or abnormal vaginal discharge.   °· You need prescription medicine for your pain.   °· You vomit.   °· You cannot take your medicines.   °· Your partner has an STD.   °SEEK IMMEDIATE MEDICAL CARE IF:  °· You have a fever.   °· You have increased abdominal or pelvic pain.   °· You have chills.   °· You have pain when you urinate.   °· You are not better after 72 hours following treatment.   °MAKE SURE YOU:  °· Understand these instructions. °· Will watch your condition. °· Will get help right away if you are not doing well or get worse. °  pelvic pain.    You have chills.    You have pain when you urinate.    You are not better after 72 hours following treatment.   MAKE SURE YOU:    Understand these instructions.   Will watch your condition.   Will get help right away if you are not doing well or get worse.  Document Released: 06/28/2005 Document Revised: 03/22/2012 Document Reviewed: 06/24/2011  [employer] Patient Information 2014 [employer].

## 2013-05-15 ENCOUNTER — Other Ambulatory Visit: Payer: Self-pay

## 2013-05-15 ENCOUNTER — Emergency Department (HOSPITAL_COMMUNITY)
Admission: EM | Admit: 2013-05-15 | Discharge: 2013-05-15 | Disposition: A | Discharge status: Home / Self Care | Payer: Self-pay | Attending: Emergency Medicine | Admitting: Emergency Medicine

## 2013-05-15 ENCOUNTER — Encounter (HOSPITAL_COMMUNITY): Payer: Self-pay | Admitting: Emergency Medicine

## 2013-05-15 ENCOUNTER — Emergency Department (HOSPITAL_COMMUNITY): Payer: Self-pay

## 2013-05-15 DIAGNOSIS — Z8719 Personal history of other diseases of the digestive system: Secondary | ICD-10-CM | POA: Insufficient documentation

## 2013-05-15 DIAGNOSIS — IMO0002 Reserved for concepts with insufficient information to code with codable children: Secondary | ICD-10-CM | POA: Insufficient documentation

## 2013-05-15 DIAGNOSIS — Z79899 Other long term (current) drug therapy: Secondary | ICD-10-CM | POA: Insufficient documentation

## 2013-05-15 DIAGNOSIS — E669 Obesity, unspecified: Secondary | ICD-10-CM | POA: Insufficient documentation

## 2013-05-15 DIAGNOSIS — J45901 Unspecified asthma with (acute) exacerbation: Secondary | ICD-10-CM | POA: Insufficient documentation

## 2013-05-15 DIAGNOSIS — Z8742 Personal history of other diseases of the female genital tract: Secondary | ICD-10-CM | POA: Insufficient documentation

## 2013-05-15 DIAGNOSIS — F17200 Nicotine dependence, unspecified, uncomplicated: Secondary | ICD-10-CM | POA: Insufficient documentation

## 2013-05-15 DIAGNOSIS — Z8619 Personal history of other infectious and parasitic diseases: Secondary | ICD-10-CM | POA: Insufficient documentation

## 2013-05-15 LAB — BASIC METABOLIC PANEL
BUN: 10 mg/dL (ref 6–23)
Chloride: 103 mEq/L (ref 96–112)
Creatinine, Ser: 0.85 mg/dL (ref 0.50–1.10)
GFR calc Af Amer: >90 mL/min (ref >90)
Potassium: 3.6 mEq/L (ref 3.5–5.1)
Sodium: 136 mEq/L (ref 135–145)

## 2013-05-15 LAB — PRO B NATRIURETIC PEPTIDE: Pro B Natriuretic peptide (BNP): 65.4 pg/mL (ref 0–125)

## 2013-05-15 LAB — CBC
HCT: 26 % — ABNORMAL LOW (ref 36.0–46.0)
Platelets: 399 10*3/uL (ref 150–400)
RBC: 3.96 MIL/uL (ref 3.87–5.11)
RDW: 18.5 % — ABNORMAL HIGH (ref 11.5–15.5)
WBC: 9.1 10*3/uL (ref 4.0–10.5)

## 2013-05-15 LAB — POCT I-STAT TROPONIN I: Troponin i, poc: 0.01 ng/mL (ref 0.00–0.08)

## 2013-05-15 MED ORDER — ALBUTEROL SULFATE (5 MG/ML) 0.5% IN NEBU
5.0000 mg | INHALATION_SOLUTION | Freq: Once | RESPIRATORY_TRACT | Status: AC
  Administered 2013-05-15: 5 mg via RESPIRATORY_TRACT
  Filled 2013-05-15: qty 1

## 2013-05-15 MED ORDER — PREDNISONE 20 MG PO TABS: ORAL_TABLET | ORAL | Status: DC

## 2013-05-15 MED ORDER — ALBUTEROL SULFATE (5 MG/ML) 0.5% IN NEBU
5.0000 mg | INHALATION_SOLUTION | Freq: Once | RESPIRATORY_TRACT | Status: DC
  Filled 2013-05-15: qty 1

## 2013-05-15 MED ORDER — ALBUTEROL SULFATE HFA 108 (90 BASE) MCG/ACT IN AERS
2.0000 | INHALATION_SPRAY | Freq: Once | RESPIRATORY_TRACT | Status: AC
  Administered 2013-05-15: 2 via RESPIRATORY_TRACT
  Filled 2013-05-15: qty 6.7

## 2013-05-15 MED ORDER — PREDNISONE 20 MG PO TABS
60.0000 mg | ORAL_TABLET | Freq: Once | ORAL | Status: AC
  Administered 2013-05-15: 60 mg via ORAL
  Filled 2013-05-15: qty 3

## 2013-05-15 MED ORDER — KETOROLAC TROMETHAMINE 60 MG/2ML IM SOLN
60.0000 mg | Freq: Once | INTRAMUSCULAR | Status: AC
  Administered 2013-05-15: 60 mg via INTRAMUSCULAR
  Filled 2013-05-15: qty 2

## 2013-05-15 MED ORDER — IPRATROPIUM BROMIDE 0.02 % IN SOLN
0.5000 mg | Freq: Once | RESPIRATORY_TRACT | Status: AC
  Administered 2013-05-15: 0.5 mg via RESPIRATORY_TRACT
  Filled 2013-05-15: qty 2.5

## 2013-05-15 NOTE — ED Provider Notes (Signed)
CSN: [id number]     Arrival date & time 05/15/13  1518 History   First MD Initiated Contact with Patient 05/15/13 1740     Chief Complaint  Patient presents with  . Chest Pain  . Shortness of Breath   (Consider location/radiation/quality/duration/timing/severity/associated sxs/prior Treatment) HPI Comments: Patient is a 42 year old female the past because of asthma, obesity and seasonal allergies who presents to the emergency department complaining of shortness of breath and wheezing beginning earlier today while at work when she was around cleaning solution. Patient states she tends to have asthma exacerbations with weather change and cleaning material, after smelling this she developed wheezing and a hacking cough. Shortly after she developed midsternal chest pain, nonradiating rated 7/10, worse when she took off her bra. She ran out of her albuterol inhaler and has not had any alleviating factors. Earlier in the day she was feeling fine. Denies recent illness, fever, chills, nausea or vomiting.  Patient is a 42 y.o. female presenting with chest pain and shortness of breath. The history is provided by the patient.  Chest Pain Associated symptoms: cough and shortness of breath   Shortness of Breath Associated symptoms: chest pain, cough and wheezing     Past Medical History  Diagnosis Date  . Asthma   . Obesity   . Seasonal allergies   . Diverticulitis   . Ovarian cyst   . Infection     trich   Past Surgical History  Procedure Laterality Date  . Tubal ligation    . Cesarean section    . Forehead reconstruction      as a child   Family History  Problem Relation Age of Onset  . Diabetes Mother   . Heart disease Mother     chf  . Hypertension Father    History  Substance Use Topics  . Smoking status: Current Some Day Smoker    Types: Cigarettes  . Smokeless tobacco: Never Used  . Alcohol Use: 0.0 oz/week     Comment: occasion   OB History   Grav Para Term Preterm  Abortions TAB SAB Ect Mult Living   5 5 4 1  0 0 0 0 0 5     Review of Systems  Respiratory: Positive for cough, shortness of breath and wheezing.   Cardiovascular: Positive for chest pain.  All other systems reviewed and are negative.    Allergies  Ibuprofen; Azithromycin; and Other  Home Medications   Current Outpatient Rx  Name  Route  Sig  Dispense  Refill  . acetaminophen (TYLENOL) 500 MG tablet   Oral   Take 1,000 mg by mouth every 6 (six) hours as needed for pain.         Marland Kitchen albuterol (PROVENTIL) 4 MG tablet   Oral   Take 1 tablet (4 mg total) by mouth 3 (three) times daily.   60 tablet   0   . diphenhydrAMINE (BENADRYL) 25 MG tablet   Oral   Take 25 mg by mouth every 6 (six) hours as needed for itching or allergies.         . predniSONE (DELTASONE) 20 MG tablet      2 tabs po daily x 3 days   6 tablet   0    BP 159/90  Pulse 73  Temp(Src) 98.9 F (37.2 C) (Oral)  Resp 18  Ht 5\' 2"  (1.575 m)  Wt 243 lb (110.224 kg)  BMI 44.43 kg/m2  SpO2 100%  LMP 04/25/2013 Physical Exam  Nursing note and vitals reviewed. Constitutional: She is oriented to person, place, and time. She appears well-developed and well-nourished. No distress.  HENT:  Head: Normocephalic and atraumatic.  Mouth/Throat: Oropharynx is clear and moist.  Eyes: Conjunctivae and EOM are normal. Pupils are equal, round, and reactive to light.  Neck: Normal range of motion. Neck supple.  Cardiovascular: Normal rate, regular rhythm and normal heart sounds.   Pulmonary/Chest: Effort normal. No respiratory distress. She has wheezes (scattered inspiratory<expiratory). She exhibits tenderness (mid-sternal).  Abdominal: Soft. Bowel sounds are normal. There is no tenderness.  Musculoskeletal: Normal range of motion. She exhibits no edema.  Neurological: She is alert and oriented to person, place, and time.  Skin: Skin is warm and dry. She is not diaphoretic.  Psychiatric: She has a normal mood and  affect. Her behavior is normal.    ED Course  Procedures (including critical care time) Labs Review Labs Reviewed  CBC - Abnormal; Notable for the following:    Hemoglobin 8.1 (*)    HCT 26.0 (*)    MCV 65.7 (*)    MCH 20.5 (*)    RDW 18.5 (*)    All other components within normal limits  BASIC METABOLIC PANEL - Abnormal; Notable for the following:    GFR calc non Af Amer 85 (*)    All other components within normal limits  PRO B NATRIURETIC PEPTIDE  POCT I-STAT TROPONIN I   Imaging Review Dg Chest 2 View  05/15/2013   CLINICAL DATA:  Chest pain and shortness of breath  EXAM: CHEST  2 VIEW  COMPARISON:  March 14, 2013  FINDINGS: There is stable scarring in the right base with blunting of the right costophrenic angle. There is no edema or consolidation. Heart size and pulmonary vascularity are normal. No pneumothorax. No bone lesions.  IMPRESSION: Stable scarring right base. No edema or consolidation. No appreciable change from prior study.   Electronically Signed   By: Bretta Bang M.D.   On: 05/15/2013 16:45    EKG Interpretation   None      Date: 05/15/2013  Rate: 80  Rhythm: normal sinus rhythm  QRS Axis: right  Intervals: normal  ST/T Wave abnormalities: normal  Conduction Disutrbances:none  Narrative Interpretation: NSR with sinus arrhythmia, RAD, no sig changes since prior.  Old EKG Reviewed: unchanged    MDM   1. Asthma exacerbation     Patient with sob, wheezing, reproducible chest pain. She is well appearing and in no apparent distress. No respiratory distress. Asthma exacerbation from weather change and exposure to cleaning solution. Reports some improvement with albuterol breathing treatment given prior to being seen. Labs obtained in triage prior to patient being seen along with chest x-ray- labs at baseline. Chest x-ray without any acute findings. Will give duo neb, 60 mg PO prednisone, Toradol and reassess. 6:41 PM Patient reports improvement with  above medication regimen. On reexamination, lungs with marked clinical improvement. O2 sat 99% on room air. She is stable for discharge. Return precautions given. Discharge with intercourse of prednisone, albuterol inhaler. Patient states understanding of treatment care plan and is agreeable.   Trevor Mace, PA-C 05/15/13 [phone]

## 2013-05-15 NOTE — ED Notes (Signed)
Pt reports increased SOB dt asthma. Pt is currently out of rx for asthma. States the weather change and being exposed to chemicals today has made SOB increase. Exp wheezing heard bilaterally. Denies chest pain, but reports tightness. NAD. VSS.

## 2013-05-15 NOTE — ED Notes (Signed)
PT feels much improved after breathing tx. Exp wheezing diminished. Pt feels too "jittery" for another breathing tx. 100% on RA.

## 2013-05-15 NOTE — ED Notes (Signed)
Onset today shortness of breath while at work smelled cleaning solution and developed chest pain 7/10.

## 2013-05-15 NOTE — Progress Notes (Signed)
Pt given inhaler but did not want to take inhaler at this time. Pt is well educated on how to use. Pt is on RA 100%.

## 2013-05-16 NOTE — ED Provider Notes (Signed)
Medical screening examination/treatment/procedure(s) were performed by non-physician practitioner and as supervising physician I was immediately available for consultation/collaboration.  EKG Interpretation   None        Shadoe Bethel, MD 05/16/13 2138 

## 2013-05-21 ENCOUNTER — Encounter: Payer: Self-pay | Admitting: Obstetrics & Gynecology

## 2013-05-21 ENCOUNTER — Ambulatory Visit (INDEPENDENT_AMBULATORY_CARE_PROVIDER_SITE_OTHER): Payer: Self-pay | Admitting: Obstetrics & Gynecology

## 2013-05-21 VITALS — BP 149/91 | HR 88 | Temp 96.7°F | Ht 62.0 in | Wt 232.7 lb

## 2013-05-21 DIAGNOSIS — D279 Benign neoplasm of unspecified ovary: Secondary | ICD-10-CM | POA: Insufficient documentation

## 2013-05-21 NOTE — Patient Instructions (Signed)
Ovarian Cyst  The ovaries are small organs that are on each side of the uterus. The ovaries are the organs that produce the female hormones, estrogen and progesterone. An ovarian cyst is a sac filled with fluid that can vary in its size. It is normal for a small cyst to form in women who are in the childbearing age and who have menstrual periods. This type of cyst is called a follicle cyst that becomes an ovulation cyst (corpus luteum cyst) after it produces the women's egg. It later goes away on its own if the woman does not become pregnant. There are other kinds of ovarian cysts that may cause problems and may need to be treated. The most serious problem is a cyst with cancer. It should be noted that menopausal women who have an ovarian cyst are at a higher risk of it being a cancer cyst. They should be evaluated very quickly, thoroughly and followed closely. This is especially true in menopausal women because of the high rate of ovarian cancer in women in menopause.  CAUSES AND TYPES OF OVARIAN CYSTS:   FUNCTIONAL CYST: The follicle/corpus luteum cyst is a functional cyst that occurs every month during ovulation with the menstrual cycle. They go away with the next menstrual cycle if the woman does not get pregnant. Usually, there are no symptoms with a functional cyst.   ENDOMETRIOMA CYST: This cyst develops from the lining of the uterus tissue. This cyst gets in or on the ovary. It grows every month from the bleeding during the menstrual period. It is also called a "chocolate cyst" because it becomes filled with blood that turns brown. This cyst can cause pain in the lower abdomen during intercourse and with your menstrual period.   CYSTADENOMA CYST: This cyst develops from the cells on the outside of the ovary. They usually are not cancerous. They can get very big and cause lower abdomen pain and pain with intercourse. This type of cyst can twist on itself, cut off its blood supply and cause severe pain. It  also can easily rupture and cause a lot of pain.   DERMOID CYST: This type of cyst is sometimes found in both ovaries. They are found to have different kinds of body tissue in the cyst. The tissue includes skin, teeth, hair, and/or cartilage. They usually do not have symptoms unless they get very big. Dermoid cysts are rarely cancerous.   POLYCYSTIC OVARY: This is a rare condition with hormone problems that produces many small cysts on both ovaries. The cysts are follicle-like cysts that never produce an egg and become a corpus luteum. It can cause an increase in body weight, infertility, acne, increase in body and facial hair and lack of menstrual periods or rare menstrual periods. Many women with this problem develop type 2 diabetes. The exact cause of this problem is unknown. A polycystic ovary is rarely cancerous.   THECA LUTEIN CYST: Occurs when too much hormone (human chorionic gonadotropin) is produced and over-stimulates the ovaries to produce an egg. They are frequently seen when doctors stimulate the ovaries for invitro-fertilization (test tube babies).   LUTEOMA CYST: This cyst is seen during pregnancy. Rarely it can cause an obstruction to the birth canal during labor and delivery. They usually go away after delivery.  SYMPTOMS    Pelvic pain or pressure.   Pain during sexual intercourse.   Increasing girth (swelling) of the abdomen.   Abnormal menstrual periods.   Increasing pain with menstrual periods.     You stop having menstrual periods and you are not pregnant.  DIAGNOSIS   The diagnosis can be made during:   Routine or annual pelvic examination (common).   Ultrasound.   X-ray of the pelvis.   CT Scan.   MRI.   Blood tests.  TREATMENT    Treatment may only be to follow the cyst monthly for 2 to 3 months with your caregiver. Many go away on their own, especially functional cysts.   May be aspirated (drained) with a long needle with ultrasound, or by laparoscopy (inserting a tube into  the pelvis through a small incision).   The whole cyst can be removed by laparoscopy.   Sometimes the cyst may need to be removed through an incision in the lower abdomen.   Hormone treatment is sometimes used to help dissolve certain cysts.   Birth control pills are sometimes used to help dissolve certain cysts.  HOME CARE INSTRUCTIONS   Follow your caregiver's advice regarding:   Medicine.   Follow up visits to evaluate and treat the cyst.   You may need to come back or make an appointment with another caregiver, to find the exact cause of your cyst, if your caregiver is not a gynecologist.   Get your yearly and recommended pelvic examinations and Pap tests.   Let your caregiver know if you have had an ovarian cyst in the past.  SEEK MEDICAL CARE IF:    Your periods are late, irregular, they stop, or are painful.   Your stomach (abdomen) or pelvic pain does not go away.   Your stomach becomes larger or swollen.   You have pressure on your bladder or trouble emptying your bladder completely.   You have painful sexual intercourse.   You have feelings of fullness, pressure, or discomfort in your stomach.   You lose weight for no apparent reason.   You feel generally ill.   You become constipated.   You lose your appetite.   You develop acne.   You have an increase in body and facial hair.   You are gaining weight, without changing your exercise and eating habits.   You think you are pregnant.  SEEK IMMEDIATE MEDICAL CARE IF:    You have increasing abdominal pain.   You feel sick to your stomach (nausea) and/or vomit.   You develop a fever that comes on suddenly.   You develop abdominal pain during a bowel movement.   Your menstrual periods become heavier than usual.  Document Released: 06/28/2005 Document Revised: 09/20/2011 Document Reviewed: 05/01/2009  [employer] Patient Information 2014 [employer].

## 2013-05-21 NOTE — Progress Notes (Signed)
Patient ID: Anne Schaefer, female   DOB: 1970/12/13, 42 y.o.   MRN: [id number] Will apply for orange card, medicaid was denied. Once her coverage is settled return to schedule laparoscopy for ovarian dermoid. No problems with pain now, exam deferred.  Adam Phenix, MD 05/21/2013 5:17 PM

## 2013-05-28 ENCOUNTER — Emergency Department (HOSPITAL_COMMUNITY)
Admission: EM | Admit: 2013-05-28 | Discharge: 2013-05-28 | Disposition: A | Discharge status: Home / Self Care | Payer: Self-pay | Attending: Emergency Medicine | Admitting: Emergency Medicine

## 2013-05-28 ENCOUNTER — Encounter (HOSPITAL_COMMUNITY): Payer: Self-pay | Admitting: Emergency Medicine

## 2013-05-28 DIAGNOSIS — J45901 Unspecified asthma with (acute) exacerbation: Secondary | ICD-10-CM | POA: Insufficient documentation

## 2013-05-28 DIAGNOSIS — F17200 Nicotine dependence, unspecified, uncomplicated: Secondary | ICD-10-CM | POA: Insufficient documentation

## 2013-05-28 DIAGNOSIS — Z8719 Personal history of other diseases of the digestive system: Secondary | ICD-10-CM | POA: Insufficient documentation

## 2013-05-28 DIAGNOSIS — E669 Obesity, unspecified: Secondary | ICD-10-CM | POA: Insufficient documentation

## 2013-05-28 DIAGNOSIS — Z8619 Personal history of other infectious and parasitic diseases: Secondary | ICD-10-CM | POA: Insufficient documentation

## 2013-05-28 DIAGNOSIS — Z8742 Personal history of other diseases of the female genital tract: Secondary | ICD-10-CM | POA: Insufficient documentation

## 2013-05-28 DIAGNOSIS — Z79899 Other long term (current) drug therapy: Secondary | ICD-10-CM | POA: Insufficient documentation

## 2013-05-28 DIAGNOSIS — N61 Mastitis without abscess: Secondary | ICD-10-CM | POA: Insufficient documentation

## 2013-05-28 MED ORDER — CEPHALEXIN 500 MG PO CAPS: 500.0000 mg | ORAL_CAPSULE | Freq: Four times a day (QID) | ORAL | Status: DC

## 2013-05-28 MED ORDER — HYDROCODONE-ACETAMINOPHEN 5-325 MG PO TABS: 1.0000 | ORAL_TABLET | Freq: Four times a day (QID) | ORAL | Status: DC | PRN

## 2013-05-28 MED ORDER — SULFAMETHOXAZOLE-TRIMETHOPRIM 800-160 MG PO TABS: 1.0000 | ORAL_TABLET | Freq: Two times a day (BID) | ORAL | Status: AC

## 2013-05-28 NOTE — ED Provider Notes (Signed)
CSN: [id number]     Arrival date & time 05/28/13  1117 History  This chart was scribed for non-physician practitioner Fayrene Helper, PA-C working with Laray Anger, DO by Leone Payor, ED Scribe. This patient was seen in room TR09C/TR09C and the patient's care was started at 1117.    Chief Complaint  Patient presents with  . Rash  . Abscess    The history is provided by the patient. No language interpreter was used.    HPI Comments: Anne Schaefer is a 42 y.o. female who presents to the Emergency Department complaining of 2 days of gradual onset, gradually worsening, sharp, constant pain, redness, and swelling to the left breast. Pt states she has chronic eczema on bilateral breasts and areola. Pt is concerned that it may be an abscess. She denies fever, chest pain, SOB, nipple discharge.  Currently not breast feeding.    Past Medical History  Diagnosis Date  . Asthma   . Obesity   . Seasonal allergies   . Diverticulitis   . Ovarian cyst   . Infection     trich   Past Surgical History  Procedure Laterality Date  . Tubal ligation    . Cesarean section    . Forehead reconstruction      as a child   Family History  Problem Relation Age of Onset  . Diabetes Mother   . Heart disease Mother     chf  . Hypertension Father    History  Substance Use Topics  . Smoking status: Current Some Day Smoker    Types: Cigarettes  . Smokeless tobacco: Never Used  . Alcohol Use: 0.0 oz/week     Comment: occasion   OB History   Grav Para Term Preterm Abortions TAB SAB Ect Mult Living   5 5 4 1  0 0 0 0 0 5     Review of Systems  Constitutional: Negative for fever.  Skin: Positive for rash (redness to left breast).  Neurological: Negative for numbness.    Allergies  Ibuprofen; Azithromycin; and Other  Home Medications   Current Outpatient Rx  Name  Route  Sig  Dispense  Refill  . acetaminophen (TYLENOL) 500 MG tablet   Oral   Take 1,000 mg by mouth every 6 (six) hours as  needed for pain.         Marland Kitchen albuterol (PROVENTIL HFA;VENTOLIN HFA) 108 (90 BASE) MCG/ACT inhaler   Inhalation   Inhale 2 puffs into the lungs every 4 (four) hours as needed for wheezing or shortness of breath.         . diphenhydrAMINE (BENADRYL) 25 MG tablet   Oral   Take 25 mg by mouth every 6 (six) hours as needed for itching or allergies.          BP 160/98  Pulse 106  Temp(Src) 98.7 F (37.1 C) (Oral)  Resp 20  SpO2 98%  LMP 05/16/2013 Physical Exam  Nursing note and vitals reviewed. Constitutional: She is oriented to person, place, and time. She appears well-developed and well-nourished.  HENT:  Head: Normocephalic and atraumatic.  Cardiovascular: Normal rate, regular rhythm and normal heart sounds.   Pulmonary/Chest: Effort normal. No respiratory distress. She has wheezes (faint expiratory). She has no rales. Right breast exhibits no nipple discharge. Left breast exhibits skin change. Left breast exhibits no nipple discharge.  Left breast: Redness, warmth, tenderness, and swelling to left breast. No nipple discharge. Quarter sized area of induration at the  11  o'clock position with moderate erythema throughout medial apect of left breast. Moderate tenderness to palpation. Eczematic skin changes around bilateral areolas. Otherwise breast lies normal, no peaud'orange. No pustular, vesicular, or petechial lesions.    Abdominal: She exhibits no distension.  Neurological: She is alert and oriented to person, place, and time.  Skin: Skin is warm and dry.  Psychiatric: She has a normal mood and affect.    ED Course  Procedures   DIAGNOSTIC STUDIES: Oxygen Saturation is 98% on RA, normal by my interpretation.    COORDINATION OF CARE: 1:00 PM Will prescribe pain medication. Advised pt to continue with warm compresses and follow up with surgeon if symptoms worsen. Discussed treatment plan with pt at bedside and pt agreed to plan.   Labs Review Labs Reviewed - No data to  display Imaging Review No results found.  EKG Interpretation   None       MDM   1. Mastitis, left, acute    BP 160/98  Pulse 106  Temp(Src) 98.7 F (37.1 C) (Oral)  Resp 20  SpO2 98%  LMP 05/16/2013   I personally performed the services described in this documentation, which was scribed in my presence. The recorded information has been reviewed and is accurate.    Fayrene Helper, PA-C 05/28/13 1331

## 2013-05-28 NOTE — ED Provider Notes (Signed)
Medical screening examination/treatment/procedure(s) were performed by non-physician practitioner and as supervising physician I was immediately available for consultation/collaboration.  EKG Interpretation   None         Baleria Wyman M Maricia Scotti, DO 05/28/13 1714 

## 2013-05-28 NOTE — ED Notes (Signed)
Pt has large area of redness and swelling left breast. States it appeared 2 days ago. Has been putting warm compresses on it without improvement.

## 2013-05-28 NOTE — ED Notes (Signed)
Pt c/o rash that is known to pt at eczema to left breast that she thinks has possible abscess or infection; redness with some localized swelling noted to left breast

## 2013-06-10 ENCOUNTER — Emergency Department (HOSPITAL_COMMUNITY)
Admission: EM | Admit: 2013-06-10 | Discharge: 2013-06-10 | Disposition: A | Discharge status: Home / Self Care | Payer: Self-pay | Attending: Emergency Medicine | Admitting: Emergency Medicine

## 2013-06-10 ENCOUNTER — Emergency Department (HOSPITAL_COMMUNITY): Payer: Self-pay

## 2013-06-10 ENCOUNTER — Encounter (HOSPITAL_COMMUNITY): Payer: Self-pay | Admitting: Emergency Medicine

## 2013-06-10 DIAGNOSIS — J45901 Unspecified asthma with (acute) exacerbation: Secondary | ICD-10-CM | POA: Insufficient documentation

## 2013-06-10 DIAGNOSIS — F17200 Nicotine dependence, unspecified, uncomplicated: Secondary | ICD-10-CM | POA: Insufficient documentation

## 2013-06-10 DIAGNOSIS — Z8719 Personal history of other diseases of the digestive system: Secondary | ICD-10-CM | POA: Insufficient documentation

## 2013-06-10 DIAGNOSIS — R0789 Other chest pain: Secondary | ICD-10-CM | POA: Insufficient documentation

## 2013-06-10 DIAGNOSIS — E669 Obesity, unspecified: Secondary | ICD-10-CM | POA: Insufficient documentation

## 2013-06-10 DIAGNOSIS — Z8742 Personal history of other diseases of the female genital tract: Secondary | ICD-10-CM | POA: Insufficient documentation

## 2013-06-10 MED ORDER — IPRATROPIUM BROMIDE 0.02 % IN SOLN
0.5000 mg | Freq: Once | RESPIRATORY_TRACT | Status: AC
  Administered 2013-06-10: 0.5 mg via RESPIRATORY_TRACT
  Filled 2013-06-10: qty 2.5

## 2013-06-10 MED ORDER — PREDNISONE 20 MG PO TABS
60.0000 mg | ORAL_TABLET | Freq: Once | ORAL | Status: AC
  Administered 2013-06-10: 60 mg via ORAL
  Filled 2013-06-10: qty 3

## 2013-06-10 MED ORDER — ALBUTEROL SULFATE (5 MG/ML) 0.5% IN NEBU
5.0000 mg | INHALATION_SOLUTION | Freq: Once | RESPIRATORY_TRACT | Status: AC
  Administered 2013-06-10: 5 mg via RESPIRATORY_TRACT
  Filled 2013-06-10: qty 1

## 2013-06-10 MED ORDER — PREDNISONE 20 MG PO TABS: 40.0000 mg | ORAL_TABLET | Freq: Every day | ORAL | Status: DC

## 2013-06-10 NOTE — ED Provider Notes (Signed)
CSN: [id number]     Arrival date & time 06/10/13  1519 History  This chart was scribed for non-physician practitioner Dierdre Forth, PA-C working with Juliet Rude. Rubin Payor, MD by Danella Maiers, ED Scribe. This patient was seen in room TR06C/TR06C and the patient's care was started at 4:23 PM.   Chief Complaint  Patient presents with  . Asthma   The history is provided by the patient and medical records. No language interpreter was used.   HPI Comments: Anne Schaefer is a 42 y.o. female with a h/o asthma who presents to the Emergency Department complaining of exacerbation of asthma that started this morning while driving to work. She reports coughing since yesterday. She states she used her inhaler 10-12 times today with only moderate relief. She reports a h/o asthma exacerbation every time the weather changes. She denies hospitalizations or intubations for her asthma.  She has not taken any other medications. She just finished a course of antibiotics 5 days ago for a breast abscess. She had the flu shot. She denies recent illness. She denies sick contacts.   Past Medical History  Diagnosis Date  . Asthma   . Obesity   . Seasonal allergies   . Diverticulitis   . Ovarian cyst   . Infection     trich   Past Surgical History  Procedure Laterality Date  . Tubal ligation    . Cesarean section    . Forehead reconstruction      as a child   Family History  Problem Relation Age of Onset  . Diabetes Mother   . Heart disease Mother     chf  . Hypertension Father    History  Substance Use Topics  . Smoking status: Current Some Day Smoker    Types: Cigarettes  . Smokeless tobacco: Never Used  . Alcohol Use: 0.0 oz/week     Comment: occasion   OB History   Grav Para Term Preterm Abortions TAB SAB Ect Mult Living   5 5 4 1  0 0 0 0 0 5     Review of Systems  Constitutional: Negative for fever, diaphoresis, appetite change, fatigue and unexpected weight change.  HENT:  Negative for mouth sores.   Eyes: Negative for visual disturbance.  Respiratory: Positive for chest tightness, shortness of breath and wheezing. Negative for cough.   Cardiovascular: Negative for chest pain.  Gastrointestinal: Negative for nausea, vomiting, abdominal pain, diarrhea and constipation.  Endocrine: Negative for polydipsia, polyphagia and polyuria.  Genitourinary: Negative for dysuria, urgency, frequency and hematuria.  Musculoskeletal: Negative for back pain and neck stiffness.  Skin: Negative for rash.  Allergic/Immunologic: Negative for immunocompromised state.  Neurological: Negative for syncope, light-headedness and headaches.  Hematological: Does not bruise/bleed easily.  Psychiatric/Behavioral: Negative for sleep disturbance. The patient is not nervous/anxious.     Allergies  Ibuprofen; Azithromycin; and Other  Home Medications   Current Outpatient Rx  Name  Route  Sig  Dispense  Refill  . acetaminophen (TYLENOL) 500 MG tablet   Oral   Take 1,000 mg by mouth every 6 (six) hours as needed for pain.         Marland Kitchen albuterol (PROVENTIL HFA;VENTOLIN HFA) 108 (90 BASE) MCG/ACT inhaler   Inhalation   Inhale 2 puffs into the lungs every 4 (four) hours as needed for wheezing or shortness of breath.         . IRON PO   Oral   Take 1 tablet by mouth daily.         Marland Kitchen  cephALEXin (KEFLEX) 500 MG capsule   Oral   Take 1 capsule (500 mg total) by mouth 4 (four) times daily.   28 capsule   0   . predniSONE (DELTASONE) 20 MG tablet   Oral   Take 2 tablets (40 mg total) by mouth daily.   10 tablet   0    BP 152/88  Pulse 80  Temp(Src) 97.9 F (36.6 C) (Oral)  Resp 16  Ht 5\' 2"  (1.575 m)  Wt 230 lb 3.2 oz (104.418 kg)  BMI 42.09 kg/m2  SpO2 99%  LMP 06/05/2013 Physical Exam  Nursing note and vitals reviewed. Constitutional: She is oriented to person, place, and time. She appears well-developed and well-nourished. No distress.  Awake, alert, nontoxic  appearance  HENT:  Head: Normocephalic and atraumatic.  Nose: Nose normal.  Mouth/Throat: Oropharynx is clear and moist. No oropharyngeal exudate.  Eyes: Conjunctivae are normal. No scleral icterus.  Neck: Normal range of motion. Neck supple.  Cardiovascular: Normal rate, regular rhythm, normal heart sounds and intact distal pulses.   No murmur heard. Pulmonary/Chest: Effort normal. No accessory muscle usage or stridor. Not tachypneic. No respiratory distress. She has decreased breath sounds. She has wheezes (throughout). She has no rhonchi. She has no rales. She exhibits no tenderness and no bony tenderness.  Decreased Breath sounds Wheezing throughout No respiratory distress, accessory muscle usage or retractions  Abdominal: Soft. Bowel sounds are normal. She exhibits no distension. There is no tenderness. There is no rebound.  Musculoskeletal: Normal range of motion. She exhibits no edema.  Neurological: She is alert and oriented to person, place, and time. Coordination normal.  Speech is clear and goal oriented Moves extremities without ataxia  Skin: Skin is warm and dry. She is not diaphoretic.  Psychiatric: She has a normal mood and affect.    ED Course  Procedures (including critical care time) Medications  albuterol (PROVENTIL) (5 MG/ML) 0.5% nebulizer solution 5 mg (5 mg Nebulization Given 06/10/13 1654)  ipratropium (ATROVENT) nebulizer solution 0.5 mg (0.5 mg Nebulization Given 06/10/13 1654)  predniSONE (DELTASONE) tablet 60 mg (60 mg Oral Given 06/10/13 1654)   DIAGNOSTIC STUDIES: Oxygen Saturation is 99% on RA, normal by my interpretation.    COORDINATION OF CARE: 4:59 PM- Discussed treatment plan with pt which includes breathing treatment. Pt agrees to plan.    Labs Review Labs Reviewed - No data to display Imaging Review Dg Chest 2 View (if Patient Has Fever And/or Copd)  06/10/2013   CLINICAL DATA:  Cough.  Wheezing.  Chest pain. Shortness of breath.  EXAM:  CHEST  2 VIEW  COMPARISON:  05/15/2013  FINDINGS: Stable blunting of the right lateral costophrenic angle. Airway thickening is present, suggesting bronchitis or reactive airways disease. Cardiac and mediastinal margins appear normal.  IMPRESSION: 1. Chronic blunting of the right lateral costophrenic angle, favoring scarring. 2. Airway thickening is present, suggesting bronchitis or reactive airways disease.   Electronically Signed   By: Herbie Baltimore M.D.   On: 06/10/2013 16:08    EKG Interpretation   None       MDM   1. Asthma exacerbation     LETITIA SABALA presents for asthma exacerbation with wheezing and chest tightness.  Pt has only used MDI at home.  Will give duoneb and prednisone and reassess.    6:00 PM Pt with faint expiratory wheeze in the left lung fields without rhonchi and clear breath sounds on the right.  Pt reports she is breathing at  baseline and does not think she needs another treatment.    Patient ambulated in ED with O2 saturations maintained >90, no current signs of respiratory distress and talking in long paragraphs while walking the entire time.   Prednisone given in the ED and pt will bd dc with 5 day burst. Pt has been instructed to continue using prescribed medications and to speak with PCP about today's exacerbation.   It has been determined that no acute conditions requiring further emergency intervention are present at this time. The patient/guardian have been advised of the diagnosis and plan. We have discussed signs and symptoms that warrant return to the ED, such as changes or worsening in symptoms.   Vital signs are stable at discharge.   BP 152/88  Pulse 80  Temp(Src) 97.9 F (36.6 C) (Oral)  Resp 16  Ht 5\' 2"  (1.575 m)  Wt 230 lb 3.2 oz (104.418 kg)  BMI 42.09 kg/m2  SpO2 99%  LMP 06/05/2013  Patient/guardian has voiced understanding and agreed to follow-up with the PCP or specialist.       Dierdre Forth, PA-C 06/10/13  1610

## 2013-06-10 NOTE — ED Provider Notes (Signed)
Medical screening examination/treatment/procedure(s) were performed by non-physician practitioner and as supervising physician I was immediately available for consultation/collaboration.  EKG Interpretation   None        Christianne Zacher R. Cyann Venti, MD 06/10/13 2358 

## 2013-06-10 NOTE — ED Notes (Signed)
Pt reports since this am she has felt more SOB and has been using inhaler more than normal. States she started coughing yesterday. States "im having an asthma attack this always happens when the weather changes." pt is able to speak in full unlabored sentences in triage.

## 2013-06-14 ENCOUNTER — Ambulatory Visit: Payer: Self-pay | Attending: Internal Medicine

## 2013-06-15 ENCOUNTER — Encounter: Payer: Self-pay | Admitting: Internal Medicine

## 2013-06-15 ENCOUNTER — Ambulatory Visit: Payer: Self-pay | Attending: Internal Medicine | Admitting: Internal Medicine

## 2013-06-15 ENCOUNTER — Encounter (INDEPENDENT_AMBULATORY_CARE_PROVIDER_SITE_OTHER): Payer: Self-pay

## 2013-06-15 VITALS — BP 171/105 | HR 78 | Temp 98.9°F | Resp 14 | Ht 64.0 in | Wt 233.2 lb

## 2013-06-15 DIAGNOSIS — Z7689 Persons encountering health services in other specified circumstances: Secondary | ICD-10-CM

## 2013-06-15 DIAGNOSIS — J45901 Unspecified asthma with (acute) exacerbation: Secondary | ICD-10-CM | POA: Insufficient documentation

## 2013-06-15 DIAGNOSIS — Z7189 Other specified counseling: Secondary | ICD-10-CM

## 2013-06-15 LAB — LIPID PANEL
Cholesterol: 159 mg/dL (ref 0–200)
HDL: 49 mg/dL (ref >39)
Triglycerides: 47 mg/dL (ref <150)

## 2013-06-15 MED ORDER — BUDESONIDE-FORMOTEROL FUMARATE 160-4.5 MCG/ACT IN AERO: 2.0000 | INHALATION_SPRAY | Freq: Two times a day (BID) | RESPIRATORY_TRACT | Status: DC

## 2013-06-15 MED ORDER — ALBUTEROL SULFATE HFA 108 (90 BASE) MCG/ACT IN AERS: 2.0000 | INHALATION_SPRAY | RESPIRATORY_TRACT | Status: DC | PRN

## 2013-06-15 NOTE — Progress Notes (Signed)
Pt is here to establish care and requests orange card. Possible assist and fibroid tumor? Recently had a knot on breast; didn't follow up on visit, but finished prescribed medication.

## 2013-06-16 NOTE — Progress Notes (Signed)
Patient ID: Anne Schaefer, female   DOB: February 03, 1971, 42 y.o.   MRN: [id number]   CC:  HPI:  42 y.o. female with a h/o asthma who was recently seen in ED ,  complaining of exacerbation of asthma. She reports coughing for several days . She states she used her inhaler 10-12 times today with only moderate relief. She reports a h/o asthma exacerbation every time the weather changes. She denies hospitalizations or intubations for her asthma. She has not taken any other medications. She just finished a course of antibiotics 5 days ago for a breast abscess. She had the flu shot. She denies recent illness. She denies sick contacts.  She has been seen by Gynecology for a fibroid tumor , and ovarian cyst     Allergies  Allergen Reactions  . Ibuprofen Shortness Of Breath  . Azithromycin Hives and Rash  . Other Itching and Rash    "[place] Seasoning"   Past Medical History  Diagnosis Date  . Asthma   . Obesity   . Seasonal allergies   . Diverticulitis   . Ovarian cyst   . Infection     trich   Current Outpatient Prescriptions on File Prior to Visit  Medication Sig Dispense Refill  . acetaminophen (TYLENOL) 500 MG tablet Take 1,000 mg by mouth every 6 (six) hours as needed for pain.      . cephALEXin (KEFLEX) 500 MG capsule Take 1 capsule (500 mg total) by mouth 4 (four) times daily.  28 capsule  0  . IRON PO Take 1 tablet by mouth daily.      . predniSONE (DELTASONE) 20 MG tablet Take 2 tablets (40 mg total) by mouth daily.  10 tablet  0   No current facility-administered medications on file prior to visit.   Family History  Problem Relation Age of Onset  . Diabetes Mother   . Heart disease Mother     chf  . Hypertension Father    History   Social History  . Marital Status: Single    Spouse Name: N/A    Number of Children: N/A  . Years of Education: N/A   Occupational History  . Not on file.   Social History Main Topics  . Smoking status: Current Some Day Smoker   Types: Cigarettes  . Smokeless tobacco: Never Used  . Alcohol Use: 0.0 oz/week     Comment: occasion  . Drug Use: Yes    Special: Marijuana     Comment: occasionally  . Sexual Activity: Yes    Birth Control/ Protection: Surgical   Other Topics Concern  . Not on file   Social History Narrative  . No narrative on file    Review of Systems  Constitutional: Negative for fever, chills, diaphoresis, activity change, appetite change and fatigue.  HENT: Negative for ear pain, nosebleeds, congestion, facial swelling, rhinorrhea, neck pain, neck stiffness and ear discharge.   Eyes: Negative for pain, discharge, redness, itching and visual disturbance.  Respiratory: Negative for cough, choking, chest tightness, shortness of breath, wheezing and stridor.   Cardiovascular: Negative for chest pain, palpitations and leg swelling.  Gastrointestinal: Negative for abdominal distention.  Genitourinary: Negative for dysuria, urgency, frequency, hematuria, flank pain, decreased urine volume, difficulty urinating and dyspareunia.  Musculoskeletal: Negative for back pain, joint swelling, arthralgias and gait problem.  Neurological: Negative for dizziness, tremors, seizures, syncope, facial asymmetry, speech difficulty, weakness, light-headedness, numbness and headaches.  Hematological: Negative for adenopathy. Does not bruise/bleed easily.  Psychiatric/Behavioral: Negative for hallucinations, behavioral problems, confusion, dysphoric mood, decreased concentration and agitation.    Objective:   Filed Vitals:   06/15/13 1712  BP: 171/105  Pulse: 78  Temp: 98.9 F (37.2 C)  Resp: 14    Physical Exam  Constitutional: Appears well-developed and well-nourished. No distress.  HENT: Normocephalic. External right and left ear normal. Oropharynx is clear and moist.  Eyes: Conjunctivae and EOM are normal. PERRLA, no scleral icterus.  Neck: Normal ROM. Neck supple. No JVD. No tracheal deviation. No  thyromegaly.  CVS: RRR, S1/S2 +, no murmurs, no gallops, no carotid bruit.  Pulmonary: Effort and breath sounds normal, no stridor, rhonchi, wheezes, rales.  Abdominal: Soft. BS +,  no distension, tenderness, rebound or guarding.  Musculoskeletal: Normal range of motion. No edema and no tenderness.  Lymphadenopathy: No lymphadenopathy noted, cervical, inguinal. Neuro: Alert. Normal reflexes, muscle tone coordination. No cranial nerve deficit. Skin: Skin is warm and dry. No rash noted. Not diaphoretic. No erythema. No pallor.  Psychiatric: Normal mood and affect. Behavior, judgment, thought content normal.   Lab Results  Component Value Date   WBC 9.1 05/15/2013   HGB 8.1* 05/15/2013   HCT 26.0* 05/15/2013   MCV 65.7* 05/15/2013   PLT 399 05/15/2013   Lab Results  Component Value Date   CREATININE 0.85 05/15/2013   BUN 10 05/15/2013   NA 136 05/15/2013   K 3.6 05/15/2013   CL 103 05/15/2013   CO2 23 05/15/2013    Lab Results  Component Value Date   HGBA1C 6.0* 04/09/2012   Lipid Panel     Component Value Date/Time   CHOL 159 06/15/2013 1759   TRIG 47 06/15/2013 1759   HDL 49 06/15/2013 1759   CHOLHDL 3.2 06/15/2013 1759   VLDL 9 06/15/2013 1759   LDLCALC 101* 06/15/2013 1759       Assessment and plan:   Patient Active Problem List   Diagnosis Date Noted  . Dermoid cyst of ovary 05/21/2013  . PID (acute pelvic inflammatory disease) 04/09/2013  . Diverticulitis of sigmoid colon 04/09/2012  . Asthma in adult 04/09/2012  . Microcytic normochromic anemia 04/09/2012  . Hidradenitis suppurativa 04/09/2012       Recent Asthma exacerbation  Add symbicort Pulmonology referral for changes on X-RAY She will need pulmonary function testing  She is up-to-date with her flu vaccination  Routine followup in 3 months  The patient was given clear instructions to go to ER or return to medical center if symptoms don't improve, worsen or new problems develop. The patient verbalized  understanding. The patient was told to call to get any lab results if not heard anything in the next week.

## 2013-06-19 ENCOUNTER — Telehealth: Payer: Self-pay | Admitting: Emergency Medicine

## 2013-06-19 NOTE — Telephone Encounter (Signed)
Pt given lab results 

## 2013-06-19 NOTE — Telephone Encounter (Signed)
Message copied by Darlis Loan on Tue Jun 19, 2013  3:12 PM ------      Message from: Susie Cassette MD, [facility]      Created: Tue Jun 19, 2013  2:12 PM       Please notify patient of lipid panel was normal ------

## 2013-06-20 ENCOUNTER — Encounter (INDEPENDENT_AMBULATORY_CARE_PROVIDER_SITE_OTHER): Payer: Self-pay

## 2013-06-20 ENCOUNTER — Encounter: Payer: Self-pay | Admitting: Pulmonary Disease

## 2013-06-20 ENCOUNTER — Ambulatory Visit (INDEPENDENT_AMBULATORY_CARE_PROVIDER_SITE_OTHER): Payer: Self-pay | Admitting: Pulmonary Disease

## 2013-06-20 VITALS — BP 160/100 | HR 98 | Ht 62.0 in | Wt 233.6 lb

## 2013-06-20 DIAGNOSIS — J45909 Unspecified asthma, uncomplicated: Secondary | ICD-10-CM

## 2013-06-20 MED ORDER — ALBUTEROL SULFATE HFA 108 (90 BASE) MCG/ACT IN AERS: 2.0000 | INHALATION_SPRAY | RESPIRATORY_TRACT | Status: DC | PRN

## 2013-06-20 MED ORDER — BUDESONIDE-FORMOTEROL FUMARATE 160-4.5 MCG/ACT IN AERO: 2.0000 | INHALATION_SPRAY | Freq: Two times a day (BID) | RESPIRATORY_TRACT | Status: DC

## 2013-06-20 MED ORDER — ALBUTEROL SULFATE (2.5 MG/3ML) 0.083% IN NEBU: 2.5000 mg | INHALATION_SOLUTION | Freq: Four times a day (QID) | RESPIRATORY_TRACT | Status: DC | PRN

## 2013-06-20 NOTE — Patient Instructions (Signed)
Symbicort 160 (sample & Rx) 2 puffs twice daily Rx for albuterol 2 puffs every 6h as needed Rx for nebuliser & albuterol nebs every 6h as needed

## 2013-06-20 NOTE — Assessment & Plan Note (Addendum)
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 I will also provide her with Rx for nebuliser & albuterol nebs every 6h as needed. Hopefully nebs being generic she will be able to obtain this. I gave her a plan for future asthma exacerbation- and will discuss self administered steroid taper during future visit. Hopefully this will avoid repeated emergency room visits

## 2013-06-20 NOTE — Progress Notes (Signed)
Subjective:    Patient ID: Anne Schaefer, female    DOB: [birth year], 42 y.o.   MRN: [id number]  HPI PCP - [facility] 42 year old smoker Referred for asthma and CXR changes Chronic blunting of the right lateral costophrenic angle,  favoring scarring.  Airway thickening was noted  She had about 10 visits to the emergency department in the last 9 months. She does not have insurance and has been unable to obtain her bronchodilators. She reports asthma since birth her triggers being weather changes, strong fragrances and heat. After her last visit, she was referred to [facility] and is obtain his PCP. She has yet to obtain in [place] card She has used her grandsons nebulizer with limited relief. She was asked to use Symbicort and albuterol for rescue but it is not clear to me how she will obtain these medications. She denies hospitalizations or intubations for her asthma. Her last ED visit was on 06/10/13 and she is just completed a prednisone course. 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 Asthma   . Obesity   . Seasonal allergies   . Diverticulitis   . Ovarian cyst   . Infection     trich    Past Surgical History  Procedure Laterality Date  . Tubal ligation    . Cesarean section    . Forehead reconstruction      as a child    Allergies  Allergen Reactions  . Ibuprofen Shortness Of Breath  . Azithromycin Hives and Rash  . Other Itching and Rash    "[place] [employer]"    History   Social History  . Marital Status: Single    Spouse Name: N/A    Number of Children: N/A  . Years of Education: N/A   Occupational History  . Not on file.   Social History Main Topics  . Smoking status: Current Some Day Smoker -- 0.25 packs/day for 8 years    Types: Cigarettes  .  Smokeless tobacco: Never Used     Comment: 3-4 cigs per day  . Alcohol Use: 0.0 oz/week     Comment: occasion  . Drug Use: Yes    Special: Marijuana     Comment: occasionally  . Sexual Activity: Yes    Birth Control/ Protection: Surgical   Other Topics Concern  . Not on file   Social History Narrative  . No narrative on file    Family History  Problem Relation Age of Onset  . Diabetes Mother   . Heart disease Mother     chf  . Hypertension Father      Review of Systems  Constitutional: Negative for fever and unexpected weight change.  HENT: Positive for sinus pressure. Negative for congestion, dental problem, ear pain, nosebleeds, postnasal drip, rhinorrhea, sneezing, sore throat and trouble swallowing.   Eyes: Negative for redness and itching.  Respiratory: Positive for choking, chest tightness, shortness of breath and wheezing. Negative for cough.   Cardiovascular: Negative for palpitations and leg swelling.  Gastrointestinal: Negative for nausea and vomiting.  Genitourinary: Negative for dysuria.  Musculoskeletal: Negative for joint swelling.  Skin: Negative for rash.  Neurological: Negative for headaches.  Hematological: Does not bruise/bleed easily.  Psychiatric/Behavioral: Negative for dysphoric mood. The patient is not nervous/anxious.  Objective:   Physical Exam  Gen. Pleasant, obese, in no distress, normal affect ENT - no lesions, no post nasal drip, class 2-3 airway Neck: No JVD, no thyromegaly, no carotid bruits Lungs: no use of accessory muscles, no dullness to percussion, dnorales ,faint  Scattered rhonchi  Cardiovascular: Rhythm regular, heart sounds  normal, no murmurs or gallops, no peripheral edema Abdomen: soft and non-tender, no hepatosplenomegaly, BS normal. Musculoskeletal: No deformities, no cyanosis or clubbing Neuro:  alert, non focal, no tremors       Assessment & Plan:

## 2013-07-03 ENCOUNTER — Ambulatory Visit: Payer: Self-pay | Admitting: Adult Health

## 2013-07-20 ENCOUNTER — Ambulatory Visit (INDEPENDENT_AMBULATORY_CARE_PROVIDER_SITE_OTHER): Payer: Self-pay | Admitting: Adult Health

## 2013-07-20 ENCOUNTER — Encounter: Payer: Self-pay | Admitting: Adult Health

## 2013-07-20 ENCOUNTER — Encounter (INDEPENDENT_AMBULATORY_CARE_PROVIDER_SITE_OTHER): Payer: Self-pay

## 2013-07-20 VITALS — BP 134/84 | HR 84 | Temp 98.3°F | Ht 62.0 in | Wt 234.6 lb

## 2013-07-20 DIAGNOSIS — J45909 Unspecified asthma, uncomplicated: Secondary | ICD-10-CM | POA: Insufficient documentation

## 2013-07-20 NOTE — Patient Instructions (Signed)
Symbicort 160  2 puffs twice daily, rinse after use Albuterol inhaler 2 puffs every 6h as needed May use albuterol nebs every 6h as needed follow up Dr. Elsworth Soho  In 3 months and As needed   Please contact office for sooner follow up if symptoms do not improve or worsen or seek emergency care

## 2013-07-20 NOTE — Progress Notes (Signed)
Subjective:    Patient ID: Anne Schaefer, female    DOB: 1971-02-24, 43 y.o.   MRN: [id number]  HPI PCP - [facility] 43 year old smoker Referred for asthma and CXR changes Chronic blunting of the right lateral costophrenic angle,  favoring scarring.  Airway thickening was noted  She had about 10 visits to the emergency department in the last 9 months. She does not have insurance and has been unable to obtain her bronchodilators. She reports asthma since birth her triggers being weather changes, strong fragrances and heat. After her last visit, she was referred to [facility] and is obtain his PCP. She has yet to obtain in [place] card She has used her grandsons nebulizer with limited relief. She was asked to use Symbicort and albuterol for rescue but it is not clear to me how she will obtain these medications. She denies hospitalizations or intubations for her asthma. Her last ED visit was on 06/10/13 and she is just completed a prednisone course. 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  Did well on the Symbicort, but samples ran out prior to appt.  no new complaints. We discussed pt assistance program for medications as she does not have insurance  She is applying for orange card program but not approved yet.  She denies any wheezing, chest pain, increased shortness, of breath, recent travel. She had no emergency room or hospitalizations. His last visit, She's had no increased use of her rescue inhaler.   Review of Systems Constitutional:   No  weight loss, night sweats,  Fevers, chills, fatigue, or  lassitude.  HEENT:   No headaches,  Difficulty swallowing,  Tooth/dental problems, or  Sore throat,                No sneezing, itching, ear ache,  nasal congestion, post nasal drip,   CV:  No chest pain,  Orthopnea, PND, swelling in lower extremities, anasarca, dizziness, palpitations, syncope.   GI  No heartburn, indigestion, abdominal pain, nausea, vomiting, diarrhea, change in bowel habits, loss of appetite, bloody stools.   Resp: No shortness of breath with exertion or at rest.  No excess mucus, no productive cough,  No non-productive cough,  No coughing up of blood.  No change in color of mucus.  No wheezing.  No chest wall deformity  Skin: no rash or lesions.  GU: no dysuria, change in color of urine, no urgency or frequency.  No flank pain, no hematuria   MS:  No joint pain or swelling.  No decreased range of motion.  No back pain.  Psych:  No change in mood or affect. No depression or anxiety.  No memory loss.         Objective:   Physical Exam GEN: A/Ox3; pleasant , NAD, well nourished   HEENT:  [place]/AT,  EACs-clear, TMs-wnl, NOSE-clear, THROAT-clear, no lesions, no postnasal drip or exudate noted.   NECK:  Supple w/ fair ROM; no JVD; normal carotid impulses w/o bruits; no thyromegaly or nodules palpated; no lymphadenopathy.  RESP  Clear  P & A; w/o, wheezes/ rales/ or rhonchi.no accessory muscle use, no dullness to percussion  CARD:  RRR, no m/r/g  , no peripheral edema, pulses intact, no cyanosis or clubbing.  GI:   Soft & nt; nml bowel sounds; no organomegaly or masses detected.  Musco: Warm bil, no deformities or joint swelling noted.   Neuro: alert, no focal deficits noted.    Skin: Warm, no lesions or rashes         Assessment & Plan:

## 2013-07-20 NOTE — Assessment & Plan Note (Signed)
Asthma improved compensation on Symbicort. Patient assistance program. Shari Heritage was completed today. Patient will continue on Symbicort 2 puffs twice daily, rinse after use. Patient will follow back up in the office in 3 months and as needed

## 2013-08-16 ENCOUNTER — Emergency Department (HOSPITAL_COMMUNITY)
Admission: EM | Admit: 2013-08-16 | Discharge: 2013-08-16 | Disposition: A | Discharge status: Home / Self Care | Payer: Self-pay | Attending: Emergency Medicine | Admitting: Emergency Medicine

## 2013-08-16 ENCOUNTER — Encounter (HOSPITAL_COMMUNITY): Payer: Self-pay | Admitting: Emergency Medicine

## 2013-08-16 DIAGNOSIS — Z8619 Personal history of other infectious and parasitic diseases: Secondary | ICD-10-CM | POA: Insufficient documentation

## 2013-08-16 DIAGNOSIS — N926 Irregular menstruation, unspecified: Secondary | ICD-10-CM | POA: Insufficient documentation

## 2013-08-16 DIAGNOSIS — IMO0002 Reserved for concepts with insufficient information to code with codable children: Secondary | ICD-10-CM | POA: Insufficient documentation

## 2013-08-16 DIAGNOSIS — Z9109 Other allergy status, other than to drugs and biological substances: Secondary | ICD-10-CM | POA: Insufficient documentation

## 2013-08-16 DIAGNOSIS — Z3202 Encounter for pregnancy test, result negative: Secondary | ICD-10-CM | POA: Insufficient documentation

## 2013-08-16 DIAGNOSIS — F17200 Nicotine dependence, unspecified, uncomplicated: Secondary | ICD-10-CM | POA: Insufficient documentation

## 2013-08-16 DIAGNOSIS — N939 Abnormal uterine and vaginal bleeding, unspecified: Secondary | ICD-10-CM | POA: Insufficient documentation

## 2013-08-16 DIAGNOSIS — E669 Obesity, unspecified: Secondary | ICD-10-CM | POA: Insufficient documentation

## 2013-08-16 DIAGNOSIS — J45901 Unspecified asthma with (acute) exacerbation: Secondary | ICD-10-CM | POA: Insufficient documentation

## 2013-08-16 DIAGNOSIS — Z8719 Personal history of other diseases of the digestive system: Secondary | ICD-10-CM | POA: Insufficient documentation

## 2013-08-16 LAB — POCT PREGNANCY, URINE: PREG TEST UR: NEGATIVE

## 2013-08-16 MED ORDER — IPRATROPIUM-ALBUTEROL 0.5-2.5 (3) MG/3ML IN SOLN
3.0000 mL | Freq: Once | RESPIRATORY_TRACT | Status: AC
  Administered 2013-08-16: 3 mL via RESPIRATORY_TRACT
  Filled 2013-08-16: qty 3

## 2013-08-16 MED ORDER — ALBUTEROL SULFATE HFA 108 (90 BASE) MCG/ACT IN AERS
2.0000 | INHALATION_SPRAY | Freq: Once | RESPIRATORY_TRACT | Status: AC
  Administered 2013-08-16: 2 via RESPIRATORY_TRACT
  Filled 2013-08-16: qty 6.7

## 2013-08-16 MED ORDER — ALBUTEROL SULFATE (2.5 MG/3ML) 0.083% IN NEBU
5.0000 mg | INHALATION_SOLUTION | Freq: Once | RESPIRATORY_TRACT | Status: AC
  Administered 2013-08-16: 5 mg via RESPIRATORY_TRACT
  Filled 2013-08-16 (×2): qty 6

## 2013-08-16 MED ORDER — PREDNISONE 20 MG PO TABS
60.0000 mg | ORAL_TABLET | Freq: Once | ORAL | Status: AC
  Administered 2013-08-16: 60 mg via ORAL
  Filled 2013-08-16: qty 3

## 2013-08-16 NOTE — ED Provider Notes (Signed)
CSN: [id number]     Arrival date & time 08/16/13  1419 History   None    Chief Complaint  Patient presents with  . Wheezing    HPI  Anne Schaefer is a 43 y.o. female with a PMH of asthma, seasonal allergies, diverticulitis, ovarian cyst, and trichomonas who presents to the ED for evaluation of wheezing.  History was provided by the patient.  Patient states she was at work this afternoon when there were "a lot of smells" with cleaning supplies which aggrevated her asthma.  She reports wheezing, dyspnea, chest tightness, and SOB.  She also developed a productive cough with white/clear sputum.  No hemoptysis.  She states her symptoms are similar to asthma exacerbations she has had in the past.  She states that she ran out of her inhaler and usually she could relieve her symptoms at home, but she did not have the abilities to do so.  She denies any direct inhalation.  She states she has had similar triggers for her asthma in the past.  She was feeling well prior to work today with no recent fevers, sore throat, rhinorrhea, or congestion. She states her SOB, wheezing and chest tightness which improved remarkably after a breathing treatment in the ED.  She states she was in the hospital visiting a family member and thought she should be given a breathing treatment.  She denies any chest pain.  No hx of DVT/PE/clotting, hx of cancer, recent surgery/immobilization/travel, or estrogen supplementation. She also is worried about being pregnant.  Her LNMP was in December. Patient has hx of tubal ligation. No abdominal pain, vaginal bleeding/discharge, nausea, vomiting, dysuria, or hematuria.     Past Medical History  Diagnosis Date  . Asthma   . Obesity   . Seasonal allergies   . Diverticulitis   . Ovarian cyst   . Infection     trich   Past Surgical History  Procedure Laterality Date  . Tubal ligation    . Cesarean section    . Forehead reconstruction      as a child   Family History  Problem  Relation Age of Onset  . Diabetes Mother   . Heart disease Mother     chf  . Hypertension Father    History  Substance Use Topics  . Smoking status: Current Some Day Smoker -- 0.25 packs/day for 8 years    Types: Cigarettes  . Smokeless tobacco: Never Used     Comment: 3-4 cigs per day  . Alcohol Use: 0.0 oz/week     Comment: occasion   OB History   Grav Para Term Preterm Abortions TAB SAB Ect Mult Living   5 5 4 1  0 0 0 0 0 5     Review of Systems  Constitutional: Negative for fever, chills, diaphoresis, activity change, appetite change and fatigue.  HENT: Negative for congestion, rhinorrhea and sore throat.   Respiratory: Positive for cough, chest tightness, shortness of breath and wheezing. Negative for apnea, choking and stridor.   Cardiovascular: Negative for chest pain and leg swelling.  Gastrointestinal: Negative for nausea, vomiting, abdominal pain, diarrhea and constipation.  Genitourinary: Positive for menstrual problem. Negative for vaginal bleeding, vaginal discharge and vaginal pain.  Musculoskeletal: Negative for back pain and myalgias.  Skin: Negative for rash.  Neurological: Negative for dizziness, weakness, light-headedness and headaches.    Allergies  Ibuprofen; Azithromycin; and Other  Home Medications   Current Outpatient Rx  Name  Route  Sig  Dispense  Refill  . acetaminophen (TYLENOL) 500 MG tablet   Oral   Take 500-1,000 mg by mouth every 8 (eight) hours as needed for mild pain or headache.          . budesonide-formoterol (SYMBICORT) 160-4.5 MCG/ACT inhaler   Inhalation   Inhale 2 puffs into the lungs 2 (two) times daily.   1 Inhaler   3    BP 159/89  Pulse 82  Temp(Src) 97.2 F (36.2 C) (Oral)  Resp 20  SpO2 99%  Filed Vitals:   08/16/13 1422 08/16/13 1530 08/16/13 1738  BP: 159/89 170/92 176/91  Pulse: 82 92 82  Temp: 97.2 F (36.2 C)  98 F (36.7 C)  TempSrc: Oral  Oral  Resp: 20 20 16   SpO2: 99% 100% 97%    Physical  Exam  Nursing note and vitals reviewed. Constitutional: She is oriented to person, place, and time. She appears well-developed and well-nourished. No distress.  HENT:  Head: Normocephalic and atraumatic.  Right Ear: External ear normal.  Left Ear: External ear normal.  Nose: Nose normal.  Mouth/Throat: Oropharynx is clear and moist. No oropharyngeal exudate.  Tympanic membranes gray and translucent bilaterally with no erythema, edema, or hemotympanum.  No erythema to the posterior pharynx. Tonsils without edema or exudates. Uvula midline. No trismus. No difficulty controlling secretions.   Eyes: Conjunctivae are normal. Pupils are equal, round, and reactive to light. Right eye exhibits no discharge. Left eye exhibits no discharge.  Neck: Normal range of motion. Neck supple.  Cardiovascular: Normal rate, regular rhythm, normal heart sounds and intact distal pulses.  Exam reveals no gallop and no friction rub.   No murmur heard. Pulmonary/Chest: Effort normal. No respiratory distress. She has wheezes. She has no rales. She exhibits no tenderness.  Inspiratory and expiratory wheezing throughout. Patient able to speak in full sentences.   Abdominal: Soft. Bowel sounds are normal. She exhibits no distension and no mass. There is no tenderness. There is no rebound and no guarding.  Musculoskeletal: Normal range of motion. She exhibits no edema and no tenderness.  No leg edema or calf tenderness bilaterally  Neurological: She is alert and oriented to person, place, and time.  Skin: Skin is warm and dry. She is not diaphoretic.    ED Course  Procedures (including critical care time) Labs Review Labs Reviewed - No data to display Imaging Review No results found.  EKG Interpretation   None      Results for orders placed during the hospital encounter of 08/16/13  POCT PREGNANCY, URINE      Result Value Range   Preg Test, Ur NEGATIVE  NEGATIVE    MDM   Anne Schaefer is a 43 y.o.  female with a PMH of asthma, seasonal allergies, diverticulitis, ovarian cyst, and trichomonas who presents to the ED for evaluation of wheezing.   Rechecks  3:35 PM = Patient finished albuterol treatment.  Wheezing improved but still present.  Ordering one Duoneb.  Patient in no acute distress.   4:15 PM = Patient states she feels much better.  Chest tightness and SOB resolved.  No chest pain.  States "I dozed off a bit."  Awaiting albuterol MDI breathing treatment.   5:15 PM = Patient asymptomatic.  Wheezing resolved.  No other symptoms.  Lungs clear with intermittent expiratory wheeze.  VSS.  No hypoxia or tachypnea.  Patient comfortable watching TV.  Ordering inhaler MDI.   5:30 PM = Patient asymptomatic.  Lungs clear to  auscultation.      Etiology of symptoms likely due to an exacerbation of her asthma. Patient had wheezing initially upon exam which resolved after 1 nebulized albuterol, 1 duoneb, prednisone, and 1 Albuterol MDI treatment.  Patient with no respiratory distress or hypoxia. Patient's lungs were clear to auscultation and her symptoms resolved. Patient states symptoms are similar to her asthma exacerbations in the past. Patient afebrile and non-toxic in appearance. Patient given albuterol inhaler to take home.  Do not feel prednisone prescription is necessary at this time, due to complete resolution of symptoms. Wells score 0.  PERC negative.  Patient requested pregnancy test, which was negative.  Encouraged to follow-up with PCP.  Return precautions, discharge instructions, and follow-up was discussed with the patient before discharge.     Discharge Medication List as of 08/16/2013  5:36 PM     Final impressions: 1. Asthma exacerbation      Harold Hedge Savayah Waltrip PA-C          [place], [place] 08/17/13 [phone]

## 2013-08-16 NOTE — Discharge Instructions (Signed)
Continue to use your albuterol inhaler - 2 puffs every 4 hours Return to the emergency department if you develop any changing/worsening condition, coughing up blood, difficulty breathing not relieved by the inhaler, chest pain, fever or any other concerns (please read additional information regarding your condition below)   Asthma, Adult Asthma is a recurring condition in which the airways tighten and narrow. Asthma can make it difficult to breathe. It can cause coughing, wheezing, and shortness of breath. Asthma episodes (also called asthma attacks) range from minor to life-threatening. Asthma cannot be cured, but medicines and lifestyle changes can help control it. CAUSES Asthma is believed to be caused by inherited (genetic) and environmental factors, but its exact cause is unknown. Asthma may be triggered by allergens, lung infections, or irritants in the air. Asthma triggers are different for each person. Common triggers include:   Animal dander.  Dust mites.  Cockroaches.  Pollen from trees or grass.  Mold.  Smoke.  Air pollutants such as dust, household cleaners, hair sprays, aerosol sprays, paint fumes, strong chemicals, or strong odors.  Cold air, weather changes, and winds (which increase molds and pollens in the air).  Strong emotional expressions such as crying or laughing hard.  Stress.  Certain medicines (such as aspirin) or types of drugs (such as beta-blockers).  Sulfites in foods and drinks. Foods and drinks that may contain sulfites include dried fruit, potato chips, and sparkling grape juice.  Infections or inflammatory conditions such as the flu, a cold, or an inflammation of the nasal membranes (rhinitis).  Gastroesophageal reflux disease (GERD).  Exercise or strenuous activity. SYMPTOMS Symptoms may occur immediately after asthma is triggered or many hours later. Symptoms include:  Wheezing.  Excessive nighttime or early morning coughing.  Frequent  or severe coughing with a common cold.  Chest tightness.  Shortness of breath. DIAGNOSIS  The diagnosis of asthma is made by a review of your medical history and a physical exam. Tests may also be performed. These may include:  Lung function studies. These tests show how much air you breath in and out.  Allergy tests.  Imaging tests such as X-rays. TREATMENT  Asthma cannot be cured, but it can usually be controlled. Treatment involves identifying and avoiding your asthma triggers. It also involves medicines. There are 2 classes of medicine used for asthma treatment:   Controller medicines. These prevent asthma symptoms from occurring. They are usually taken every day.  Reliever or rescue medicines. These quickly relieve asthma symptoms. They are used as needed and provide short-term relief. Your health care provider will help you create an asthma action plan. An asthma action plan is a written plan for managing and treating your asthma attacks. It includes a list of your asthma triggers and how they may be avoided. It also includes information on when medicines should be taken and when their dosage should be changed. An action plan may also involve the use of a device called a peak flow meter. A peak flow meter measures how well the lungs are working. It helps you monitor your condition. HOME CARE INSTRUCTIONS   Take medicine as directed by your health care provider. Speak with your health care provider if you have questions about how or when to take the medicines.  Use a peak flow meter as directed by your health care provider. Record and keep track of readings.  Understand and use the action plan to help minimize or stop an asthma attack without needing to seek medical care.  Control your home environment in the following ways to help prevent asthma attacks:  Do not smoke. Avoid being exposed to secondhand smoke.  Change your heating and air conditioning filter regularly.  Limit  your use of fireplaces and wood stoves.  Get rid of pests (such as roaches and mice) and their droppings.  Throw away plants if you see mold on them.  Clean your floors and dust regularly. Use unscented cleaning products.  Try to have someone else vacuum for you regularly. Stay out of rooms while they are being vacuumed and for a short while afterward. If you vacuum, use a dust mask from a hardware store, a double-layered or microfilter vacuum cleaner bag, or a vacuum cleaner with a HEPA filter.  Replace carpet with wood, tile, or vinyl flooring. Carpet can trap dander and dust.  Use allergy-proof pillows, mattress covers, and box spring covers.  Wash bed sheets and blankets every week in hot water and dry them in a dryer.  Use blankets that are made of polyester or cotton.  Clean bathrooms and kitchens with bleach. If possible, have someone repaint the walls in these rooms with mold-resistant paint. Keep out of the rooms that are being cleaned and painted.  Wash hands frequently. SEEK MEDICAL CARE IF:   You have wheezing, shortness of breath, or a cough even if taking medicine to prevent attacks.  The colored mucus you cough up (sputum) is thicker than usual.  Your sputum changes from clear or white to yellow, green, gray, or bloody.  You have any problems that may be related to the medicines you are taking (such as a rash, itching, swelling, or trouble breathing).  You are using a reliever medicine more than 2 3 times per week.  Your peak flow is still at 50 79% of you personal best after following your action plan for 1 hour. SEEK IMMEDIATE MEDICAL CARE IF:   You seem to be getting worse and are unresponsive to treatment during an asthma attack.  You are short of breath even at rest.  You get short of breath when doing very little physical activity.  You have difficulty eating, drinking, or talking due to asthma symptoms.  You develop chest pain.  You develop a fast  heartbeat.  You have a bluish color to your lips or fingernails.  You are lightheaded, dizzy, or faint.  Your peak flow is less than 50% of your personal best.  You have a fever or persistent symptoms for more than 2 3 days.  You have a fever and symptoms suddenly get worse. MAKE SURE YOU:   Understand these instructions.  Will watch your condition.  Will get help right away if you are not doing well or get worse. Document Released: 06/28/2005 Document Revised: 02/28/2013 Document Reviewed: 01/25/2013 [facility] Patient Information 2014 [place], [place].

## 2013-08-16 NOTE — ED Notes (Signed)
Pt reports cough x2 today, today at work wheezing started, at work Clor[employer] was being used and triggered her asthma. Denies pain. SOB upon exertion at present able to speak in full sentences. Reports tightness. Wheezing heard in all lung fields.

## 2013-08-16 NOTE — Progress Notes (Signed)
[facility] provided pt with [facility] information. Patient stated that she was in the process of getting the [place] with [employer] [facility].

## 2013-08-16 NOTE — ED Notes (Signed)
Pt reports that she has not had period in several months. Would like a pregnancy test before XRAYS.

## 2013-08-18 NOTE — ED Provider Notes (Signed)
Medical screening examination/treatment/procedure(s) were performed by non-physician practitioner and as supervising physician I was immediately available for consultation/collaboration.  Richarda Blade, MD 08/18/13 [phone]

## 2013-08-28 ENCOUNTER — Emergency Department (HOSPITAL_COMMUNITY)
Admission: EM | Admit: 2013-08-28 | Discharge: 2013-08-28 | Disposition: A | Discharge status: Home / Self Care | Payer: Self-pay | Attending: Emergency Medicine | Admitting: Emergency Medicine

## 2013-08-28 ENCOUNTER — Encounter (HOSPITAL_COMMUNITY): Payer: Self-pay | Admitting: Emergency Medicine

## 2013-08-28 DIAGNOSIS — Z79899 Other long term (current) drug therapy: Secondary | ICD-10-CM | POA: Insufficient documentation

## 2013-08-28 DIAGNOSIS — X500XXA Overexertion from strenuous movement or load, initial encounter: Secondary | ICD-10-CM | POA: Insufficient documentation

## 2013-08-28 DIAGNOSIS — J45909 Unspecified asthma, uncomplicated: Secondary | ICD-10-CM | POA: Insufficient documentation

## 2013-08-28 DIAGNOSIS — S336XXA Sprain of sacroiliac joint, initial encounter: Secondary | ICD-10-CM | POA: Insufficient documentation

## 2013-08-28 DIAGNOSIS — S39012A Strain of muscle, fascia and tendon of lower back, initial encounter: Secondary | ICD-10-CM

## 2013-08-28 DIAGNOSIS — M79609 Pain in unspecified limb: Secondary | ICD-10-CM | POA: Insufficient documentation

## 2013-08-28 DIAGNOSIS — Z8742 Personal history of other diseases of the female genital tract: Secondary | ICD-10-CM | POA: Insufficient documentation

## 2013-08-28 DIAGNOSIS — F17200 Nicotine dependence, unspecified, uncomplicated: Secondary | ICD-10-CM | POA: Insufficient documentation

## 2013-08-28 DIAGNOSIS — Y9389 Activity, other specified: Secondary | ICD-10-CM | POA: Insufficient documentation

## 2013-08-28 DIAGNOSIS — Z8619 Personal history of other infectious and parasitic diseases: Secondary | ICD-10-CM | POA: Insufficient documentation

## 2013-08-28 DIAGNOSIS — Y9210 Unspecified residential institution as the place of occurrence of the external cause: Secondary | ICD-10-CM | POA: Insufficient documentation

## 2013-08-28 DIAGNOSIS — Z8719 Personal history of other diseases of the digestive system: Secondary | ICD-10-CM | POA: Insufficient documentation

## 2013-08-28 DIAGNOSIS — E669 Obesity, unspecified: Secondary | ICD-10-CM | POA: Insufficient documentation

## 2013-08-28 MED ORDER — BACLOFEN 10 MG PO TABS: 10.0000 mg | ORAL_TABLET | Freq: Three times a day (TID) | ORAL | Status: DC

## 2013-08-28 MED ORDER — TRAMADOL HCL 50 MG PO TABS: 50.0000 mg | ORAL_TABLET | Freq: Four times a day (QID) | ORAL | Status: DC | PRN

## 2013-08-28 NOTE — ED Provider Notes (Signed)
Medical screening examination/treatment/procedure(s) were performed by non-physician practitioner and as supervising physician I was immediately available for consultation/collaboration.  Richarda Blade, MD 08/28/13 [phone]

## 2013-08-28 NOTE — ED Provider Notes (Signed)
CSN: [id number]     Arrival date & time 08/28/13  1744 History   First MD Initiated Contact with Patient 08/28/13 1953     Chief Complaint  Patient presents with  . Back Pain  . Leg Pain     (Consider location/radiation/quality/duration/timing/severity/associated sxs/prior Treatment) HPI  Patient to the ER with complaints of left low back pain. 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 She says the pain started gradually and now she gets shooting pains.It has been going on for a week. She describes it as a 3/10 right now while she rests but it was "spasming" earlier. She denies having any bowel or urine incontinence. She is able to walk but has pain with ROM and touching her side. She denies weakness or numbness to the right side.   Past Medical History  Diagnosis Date  . Asthma   . Obesity   . Seasonal allergies   . Diverticulitis   . Ovarian cyst   . Infection     trich   Past Surgical History  Procedure Laterality Date  . Tubal ligation    . Cesarean section    . Forehead reconstruction      as a child   Family History  Problem Relation Age of Onset  . Diabetes Mother   . Heart disease Mother     chf  . Hypertension Father    History  Substance Use Topics  . Smoking status: Current Some Day Smoker -- 0.25 packs/day for 8 years    Types: Cigarettes  . Smokeless tobacco: Never Used     Comment: 3-4 cigs per day  . Alcohol Use: 0.0 oz/week     Comment: occasion   OB History   Grav Para Term Preterm Abortions TAB SAB Ect Mult Living   5 5 4 1  0 0 0 0 0 5     Review of Systems  The patient denies anorexia, fever, weight loss,, vision loss, decreased hearing, hoarseness, chest pain, syncope, dyspnea on exertion, peripheral edema, balance deficits, hemoptysis, abdominal pain, melena, hematochezia, severe indigestion/heartburn, hematuria, incontinence, genital sores, muscle weakness, suspicious skin lesions, transient blindness, difficulty  walking, depression, unusual weight change, abnormal bleeding, enlarged lymph nodes, angioedema, and breast masses.   Allergies  Ibuprofen; Azithromycin; and Other  Home Medications   Current Outpatient Rx  Name  Route  Sig  Dispense  Refill  . acetaminophen (TYLENOL) 500 MG tablet   Oral   Take 1,000 mg by mouth every 8 (eight) hours as needed for mild pain or headache.          . albuterol (PROVENTIL HFA;VENTOLIN HFA) 108 (90 BASE) MCG/ACT inhaler   Inhalation   Inhale 1-2 puffs into the lungs every 6 (six) hours as needed for wheezing or shortness of breath.         . budesonide-formoterol (SYMBICORT) 160-4.5 MCG/ACT inhaler   Inhalation   Inhale 2 puffs into the lungs 2 (two) times daily.         . baclofen (LIORESAL) 10 MG tablet   Oral   Take 1 tablet (10 mg total) by mouth 3 (three) times daily.   30 each   0   . traMADol (ULTRAM) 50 MG tablet   Oral   Take 1 tablet (50 mg total) by mouth every 6 (six) hours as needed.   30 tablet   0    BP 190/102  Pulse 93  Temp(Src) 98 F (36.7 C) (Oral)  Resp 18  Ht 5\' 2"  (1.575 m)  Wt 234 lb 4.8 oz (106.278 kg)  BMI 42.84 kg/m2  SpO2 98% Physical Exam  Nursing note and vitals reviewed. Constitutional: She appears well-developed and well-nourished. No distress.  HENT:  Head: Normocephalic and atraumatic.  Eyes: Pupils are equal, round, and reactive to light.  Neck: Normal range of motion. Neck supple.  Cardiovascular: Normal rate and regular rhythm.   Pulmonary/Chest: Effort normal.  Abdominal: Soft.  Musculoskeletal:       Back:   Equal strength to bilateral lower extremities. Neurosensory function adequate to both legs. Skin color is normal. Skin is warm and moist. I see no step off deformity, no bony tenderness. Pt is able to ambulate without limp. Pain is relieved when sitting in certain positions. ROM is decreased due to pain. No crepitus, laceration, effusion, swelling.  Pulses are normal     Neurological: She is alert.  Skin: Skin is warm and dry.    ED Course  Procedures (including critical care time) Labs Review Labs Reviewed - No data to display Imaging Review No results found.  EKG Interpretation   None       MDM   Final diagnoses:  Low back strain    43 y.o.Anne Schaefer  with back pain. No neurological deficits and normal neuro exam. Patient can walk but states is painful. No loss of bowel or bladder control. No concern for cauda equina. No fever, night sweats, weight loss, h/o cancer, IVDU. RICE protocol and pain medicine indicated and discussed with patient.   Patient Plan 1. Medications: narcotic pain medication, muscle relaxer and usual home medications  2. Treatment: rest, drink plenty of fluids, gentle stretching as discussed, alternate ice and heat  3. Follow Up: Please followup with your primary doctor for discussion of your diagnoses and further evaluation after today's visit; if you do not have a primary care doctor use the resource guide provided to find one   Vital signs are stable at discharge. Filed Vitals:   08/28/13 1755  BP: 190/102  Pulse: 93  Temp: 98 F (36.7 C)  Resp: 18    Patient/guardian has voiced understanding and agreed to follow-up with the PCP or specialist.         Linus Mako, PA-C 08/28/13 2006

## 2013-08-28 NOTE — ED Notes (Signed)
PA at bedside.

## 2013-08-28 NOTE — ED Notes (Signed)
PT ambulated with baseline gait; VSS; A&Ox3; no signs of distress; respirations even and unlabored; skin warm and dry; no questions upon discharge.  

## 2013-08-28 NOTE — Discharge Instructions (Signed)
Muscle Strain A muscle strain is an injury that occurs when a muscle is stretched beyond its normal length. Usually a small number of muscle fibers are torn when this happens. Muscle strain is rated in degrees. First-degree strains have the least amount of muscle fiber tearing and pain. Second-degree and third-degree strains have increasingly more tearing and pain.  Usually, recovery from muscle strain takes 1 2 weeks. Complete healing takes 5 6 weeks.  CAUSES  Muscle strain happens when a sudden, violent force placed on a muscle stretches it too far. This may occur with lifting, sports, or a fall.  RISK FACTORS Muscle strain is especially common in athletes.  SIGNS AND SYMPTOMS At the site of the muscle strain, there may be:  Pain.  Bruising.  Swelling.  Difficulty using the muscle due to pain or lack of normal function. DIAGNOSIS  Your health care provider will perform a physical exam and ask about your medical history. TREATMENT  Often, the best treatment for a muscle strain is resting, icing, and applying cold compresses to the injured area.  HOME CARE INSTRUCTIONS   Use the PRICE method of treatment to promote muscle healing during the first 2 3 days after your injury. The PRICE method involves:  Protecting the muscle from being injured again.  Restricting your activity and resting the injured body part.  Icing your injury. To do this, put ice in a plastic bag. Place a towel between your skin and the bag. Then, apply the ice and leave it on from 15 20 minutes each hour. After the third day, switch to moist heat packs.  Apply compression to the injured area with a splint or elastic bandage. Be careful not to wrap it too tightly. This may interfere with blood circulation or increase swelling.  Elevate the injured body part above the level of your heart as often as you can.  Only take over-the-counter or prescription medicines for pain, discomfort, or fever as directed by your  health care provider.  Warming up prior to exercise helps to prevent future muscle strains. SEEK MEDICAL CARE IF:   You have increasing pain or swelling in the injured area.  You have numbness, tingling, or a significant loss of strength in the injured area. MAKE SURE YOU:   Understand these instructions.  Will watch your condition.  Will get help right away if you are not doing well or get worse. Document Released: 06/28/2005 Document Revised: 04/18/2013 Document Reviewed: 01/25/2013 [facility] Patient Information 2014 [place], [place]. Mid-Back Strain with Rehab  A strain is an injury in which a tendon or muscle is torn. The muscles and tendons of the mid-back are vulnverable to strains. However, these muscles and tendons are very strong and require a great force to be injured. The muscles of the mid-back are responsible for stabilizing the spinal column, as well as spinal twisting (rotation). Strains are classified into three categories. Grade 1 strains cause pain, but the tendon is not lengthened. Grade 2 strains include a lengthened ligament, due to the ligament being stretched or partially ruptured. With grade 2 strains there is still function, although the function may be decreased. Grade 3 strains involve a complete tear of the tendon or muscle, and function is usually impaired. SYMPTOMS   Pain in the middle of the back.  Pain that may affect only one side, and is worse with movement.  Muscle spasms, and often swelling in the back.  Loss of strength of the back muscles.  Crackling sound (crepitation)  when the muscles are touched. CAUSES  Mid-back strains occur when a force is placed on the muscles or tendons that is greater than they can handle. Common causes of injury include:  Ongoing overuse of the muscle-tendon units in the middle back, usually from incorrect body posture.  A single violent injury or force applied to the back. RISK INCREASES WITH:  Sports that involve  twisting forces on the spine or a lot of bending at the waist (football, rugby, weightlifting, bowling, golf, tennis, speed skating, racquetball, swimming, running, gymnastics, diving).  Poor strength and flexibility.  Failure to warm up properly before activity.  Family history of low back pain or disk disorders.  Previous back injury or surgery (especially fusion). PREVENTION  Learn and use proper sports technique.  Warm up and stretch properly before activity.  Allow for adequate recovery between workouts.  Maintain physical fitness:  Strength, flexibility, and endurance.  Cardiovascular fitness. PROGNOSIS  If treated properly, mid-back strains usually heal within 6 weeks. RELATED COMPLICATIONS   Frequently recurring symptoms, resulting in a chronic problem. Properly treating the problem the first time decreases frequency of recurrence.  Chronic inflammation, scarring, and partial muscle-tendon tear.  Delayed healing or resolution of symptoms, especially if activity is resumed too soon.  Prolonged disability. TREATMENT Treatment first involves the use of ice and medicine, to reduce pain and inflammation. As the pain begins to subside, you may begin strengthening and stretching exercises to improve body posture and sport technique. These exercises may be performed at home or with a therapist. Severe injuries may require referral to a therapist for further evaluation and treatment, such as ultrasound. Corticosteroid injections may be given to help reduce inflammation. Biofeedback (watching monitors of your body processes) and psychotherapy may also be prescribed. Prolonged bed rest is felt to do more harm than good. Massage may help break the muscle spasms. Sometimes, an injection of cortisone, with or without local anesthetics, may be given to help relieve the pain and spasms. MEDICATION   If pain medicine is needed, nonsteroidal anti-inflammatory medicines (aspirin and  ibuprofen), or other minor pain relievers (acetaminophen), are often advised.  Do not take pain medicine for 7 days before surgery.  Prescription pain relievers may be given, if your caregiver thinks they are needed. Use only as directed and only as much as you need.  Ointments applied to the skin may be helpful.  Corticosteroid injections may be given by your caregiver. These injections should be reserved for the most serious cases, because they may only be given a certain number of times. HEAT AND COLD:   Cold treatment (icing) should be applied for 10 to 15 minutes every 2 to 3 hours for inflammation and pain, and immediately after activity that aggravates your symptoms. Use ice packs or an ice massage.  Heat treatment may be used before performing stretching and strengthening activities prescribed by your caregiver, physical therapist, or athletic trainer. Use a heat pack or a warm water soak. SEEK IMMEDIATE MEDICAL CARE IF:  Symptoms get worse or do not improve in 2 to 4 weeks, despite treatment.  You develop numbness, weakness, or loss of bowel or bladder function.  New, unexplained symptoms develop. (Drugs used in treatment may produce side effects.) EXERCISES RANGE OF MOTION (ROM) AND STRETCHING EXERCISES - Mid-Back Strain These exercises may help you when beginning to rehabilitate your injury. In order to successfully resolve your symptoms, you must improve your posture. These exercises are designed to help reduce the forward-head and rounded-shoulder  posture which contributes to this condition. Your symptoms may resolve with or without further involvement from your physician, physical therapist or athletic trainer. While completing these exercises, remember:   Restoring tissue flexibility helps normal motion to return to the joints. This allows healthier, less painful movement and activity.  An effective stretch should be held for at least 30 seconds.  A stretch should never be  painful. You should only feel a gentle lengthening or release in the stretched tissue. STRETECH - Axial Extension  Stand or sit on a firm surface. Assume a good posture: chest up, shoulders drawn back, stomach muscles slightly tense, knees unlocked (if standing) and feet hip width apart.  Slowly retract your chin, so your head slides back and your chin slightly lowers. Continue to look straight ahead.  You should feel a gentle stretch in the back of your head. Be certain not to feel an aggressive stretch since this can cause headaches later.  Hold for __________ seconds. Repeat __________ times. Complete this exercise __________ times per day. RANGE OF MOTION- Upper Thoracic Extension  Sit on a firm chair with a high back. Assume a good posture: chest up, shoulders drawn back, abdominal muscles slightly tense, and feet hip width apart. Place a small pillow or folded towel in the curve of your lower back, if you are having difficulty maintaining good posture.  Gently brace your neck with your hands, allowing your arms to rest on your chest.  Continue to support your neck and slowly extend your back over the chair. You will feel a stretch across your upper back.  Hold __________ seconds. Slowly return to the starting position. Repeat __________ times. Complete this exercise __________ times per day. RANGE OF MOTION- Mid-Thoracic Extension  Roll a towel so that it is about 4 inches in diameter.  Position the towel lengthwise. Lay on the towel so that your spine, but not your shoulder blades, are supported.  You should feel your mid-back arching toward the floor. To increase the stretch, extend your arms away from your body.  Hold for __________ seconds. Repeat exercise __________ times, __________ times per day. STRENGTHENING EXERCISES - Mid-Back Strain These exercises may help you when beginning to rehabilitate your injury. They may resolve your symptoms with or without further  involvement from your physician, physical therapist or athletic trainer. While completing these exercises, remember:   Muscles can gain both the endurance and the strength needed for everyday activities through controlled exercises.  Complete these exercises as instructed by your physician, physical therapist or athletic trainer. Increase the resistance and repetitions only as guided by your caregiver.  You may experience muscle soreness or fatigue, but the pain or discomfort you are trying to eliminate should never worsen during these exercises. If this pain does worsen, stop and make certain you are following the directions exactly. If the pain is still present after adjustments, discontinue the exercise until you can discuss the trouble with your caregiver. STRENGTHENING Quadruped, Opposite UE/LE Lift  Assume a hands and knees position on a firm surface. Keep your hands under your shoulders and your knees under your hips. You may place padding under your knees for comfort.  Find your neutral spine and gently tense your abdominal muscles so that you can maintain this position. Your shoulders and hips should form a rectangle that is parallel with the floor and is not twisted.  Keeping your trunk steady, lift your right hand no higher than your shoulder and then your left leg no  higher than your hip. Make sure you are not holding your breath. Hold this position __________ seconds.  Continuing to keep your abdominal muscles tense and your back steady, slowly return to your starting position. Repeat with the opposite arm and leg. Repeat __________ times. Complete this exercise __________ times per day.  STRENGTH - Shoulder Extensors  Secure a rubber exercise band or tubing to a fixed object (table, pole) so that it is at the height of your shoulders when you are either standing, or sitting on a firm armless chair.  With a thumbs-up grip, grasp an end of the band in each hand. Straighten your  elbows and lift your hands straight in front of you at shoulder height. Step back away from the secured end of band, until it becomes tense.  Squeezing your shoulder blades together, pull your hands down to the sides of your thighs. Do not allow your hands to go behind you.  Hold for __________ seconds. Slowly ease the tension on the band, as you reverse the directions and return to the starting position. Repeat __________ times. Complete this exercise __________ times per day.  STRENGTH - Horizontal Abductors Choose one of the two positions to complete this exercise. Prone: lying on stomach:  Lie on your stomach on a firm surface so that your right / left arm overhangs the edge. Rest your forehead on your opposite forearm. With your palm facing the floor and your elbow straight, hold a __________ weight in your hand.  Squeeze your right / left shoulder blade to your mid-back spine and then slowly raise your arm to the height of the bed.  Hold for __________ seconds. Slowly reverse the directions and return to the starting position, controlling the weight as you lower your arm. Repeat __________ times. Complete this exercise __________ times per day. Standing:   Secure a rubber exercise band or tubing, so that it is at the height of your shoulders when you are either standing, or sitting on a firm armless chair.  Grasp an end of the band in each hand and have your palms face each other. Straighten your elbows and lift your hands straight in front of you at shoulder height. Step back away from the secured end of band, until it becomes tense.  Squeeze your shoulder blades together. Keeping your elbows locked and your hands at shoulder height, spread your arms apart, forming a "T" shape with your body. Hold __________ seconds. Slowly ease the tension on the band, as you reverse the directions and return to the starting position. Repeat __________ times. Complete this exercise __________ times per  day. STRENGTH - Scapular Retractors and External Rotators, Rowing  Secure a rubber exercise band or tubing, so that it is at the height of your shoulders when you are either standing, or sitting on a firm armless chair.  With a palm-down grip, grasp an end of the band in each hand. Straighten your elbows and lift your hands straight in front of you at shoulder height. Step back away from the secured end of band, until it becomes tense.  Step 1: Squeeze your shoulder blades together. Bending your elbows, draw your hands to your chest as if you are rowing a boat. At the end of this motion, your hands and elbow should be at shoulder height and your elbows should be out to your sides.  Step 2: Rotate your shoulder to raise your hands above your head. Your forearms should be vertical and your upper arms should  be horizontal.  Hold for __________ seconds. Slowly ease the tension on the band, as you reverse the directions and return to the starting position. Repeat __________ times. Complete this exercise __________ times per day.  POSTURE AND BODY MECHANICS CONSIDERATIONS  Mid-Back Strain Keeping correct posture when sitting, standing or completing your activities will reduce the stress put on different body tissues, allowing injured tissues a chance to heal and limiting painful experiences. The following are general guidelines for improved posture. Your physician or physical therapist will provide you with any instructions specific to your needs. While reading these guidelines, remember:  The exercises prescribed by your provider will help you have the flexibility and strength to maintain correct postures.  The correct posture provides the best environment for your joints to work. All of your joints have less wear and tear when properly supported by a spine with good posture. This means you will experience a healthier, less painful body.  Correct posture must be practiced with all of your activities,  especially prolonged sitting and standing. Correct posture is as important when doing repetitive low-stress activities (typing) as it is when doing a single heavy-load activity (lifting). PROPER SITTING POSTURE In order to minimize stress and discomfort on your spine, you must sit with correct posture. Sitting with good posture should be effortless for a healthy body. Returning to good posture is a gradual process. Many people can work toward this most comfortably by using various supports until they have the flexibility and strength to maintain this posture on their own. When sitting with proper posture, your ears will fall over your shoulders and your shoulders will fall over your hips. You should use the back of the chair to support your upper back. Your lower back will be in a neutral position, just slightly arched. You may place a small pillow or folded towel at the base of your low back for  support.  When working at a desk, create an environment that supports good, upright posture. Without extra support, muscles fatigue and lead to excessive strain on joints and other tissues. Keep these recommendations in mind: CHAIR:  A chair should be able to slide under your desk when your back makes contact with the back of the chair. This allows you to work closely.  The chair's height should allow your eyes to be level with the upper part of your monitor and your hands to be slightly lower than your elbows. BODY POSITION  Your feet should make contact with the floor. If this is not possible, use a foot rest.  Keep your ears over your shoulders. This will reduce stress on your neck and lower back. INCORRECT SITTING POSTURES If you are feeling tired and unable to assume a healthy sitting posture, do not slouch or slump. This puts excessive strain on your back tissues, causing more damage and pain. Healthier options include:  Using more support, like a lumbar pillow.  Switching tasks to something that  requires you to be upright or walking.  Talking a brief walk.  Lying down to rest in a neutral-spine position. CORRECT STANDING POSTURES Proper standing posture should be assumed with all daily activities, even if they only take a few moments, like when brushing your teeth. As in sitting, your ears should fall over your shoulders and your shoulders should fall over your hips. You should keep a slight tension in your abdominal muscles to brace your spine. Your tailbone should point down to the ground, not behind your body, resulting  in an over-extended swayback posture.  INCORRECT STANDING POSTURES Common incorrect standing postures include a forward head, locked knees, and an excessive swayback. WALKING Walk with an upright posture. Your ears, shoulders and hips should all line-up. CORRECT LIFTING TECHNIQUES DO :   Assume a wide stance. This will provide you more stability and the opportunity to get as close as possible to the object which you are lifting.  Tense your abdominals to brace your spine. Bend at the knees and hips. Keeping your back locked in a neutral-spine position, lift using your leg muscles. Lift with your legs, keeping your back straight.  Test the weight of unknown objects before attempting to lift them.  Try to keep your elbows locked down at your sides in order get the best strength from your shoulders when carrying an object.  Always ask for help when lifting heavy or awkward objects. INCORRECT LIFTING TECHNIQUES DO NOT:   Lock your knees when lifting, even if it is a small object.  Bend and twist. Pivot at your feet or move your feet when needing to change directions.  Assume that you can safely pick up even a paperclip without proper posture. Document Released: 06/28/2005 Document Revised: 09/20/2011 Document Reviewed: 10/10/2008 [facility] Patient Information 2014 [place], [place].

## 2013-08-28 NOTE — ED Notes (Signed)
Pt c/o lower left sided back pain with radiation to left hip and leg x 3 days; pt denies obvious injury

## 2013-09-13 ENCOUNTER — Ambulatory Visit: Payer: Self-pay | Admitting: Internal Medicine

## 2013-10-12 ENCOUNTER — Emergency Department (HOSPITAL_COMMUNITY)
Admission: EM | Admit: 2013-10-12 | Discharge: 2013-10-12 | Disposition: A | Discharge status: Home / Self Care | Payer: Self-pay | Attending: Emergency Medicine | Admitting: Emergency Medicine

## 2013-10-12 ENCOUNTER — Encounter (HOSPITAL_COMMUNITY): Payer: Self-pay | Admitting: Emergency Medicine

## 2013-10-12 DIAGNOSIS — R062 Wheezing: Secondary | ICD-10-CM | POA: Insufficient documentation

## 2013-10-12 DIAGNOSIS — J309 Allergic rhinitis, unspecified: Secondary | ICD-10-CM | POA: Insufficient documentation

## 2013-10-12 DIAGNOSIS — Z889 Allergy status to unspecified drugs, medicaments and biological substances status: Secondary | ICD-10-CM

## 2013-10-12 DIAGNOSIS — J3489 Other specified disorders of nose and nasal sinuses: Secondary | ICD-10-CM | POA: Insufficient documentation

## 2013-10-12 DIAGNOSIS — R6889 Other general symptoms and signs: Secondary | ICD-10-CM | POA: Insufficient documentation

## 2013-10-12 DIAGNOSIS — Z881 Allergy status to other antibiotic agents status: Secondary | ICD-10-CM | POA: Insufficient documentation

## 2013-10-12 DIAGNOSIS — R0789 Other chest pain: Secondary | ICD-10-CM | POA: Insufficient documentation

## 2013-10-12 DIAGNOSIS — F17200 Nicotine dependence, unspecified, uncomplicated: Secondary | ICD-10-CM | POA: Insufficient documentation

## 2013-10-12 DIAGNOSIS — Z888 Allergy status to other drugs, medicaments and biological substances status: Secondary | ICD-10-CM | POA: Insufficient documentation

## 2013-10-12 MED ORDER — ALBUTEROL SULFATE HFA 108 (90 BASE) MCG/ACT IN AERS: 1.0000 | INHALATION_SPRAY | Freq: Four times a day (QID) | RESPIRATORY_TRACT | Status: DC | PRN

## 2013-10-12 MED ORDER — PREDNISONE 20 MG PO TABS
40.0000 mg | ORAL_TABLET | Freq: Once | ORAL | Status: AC
  Administered 2013-10-12: 40 mg via ORAL
  Filled 2013-10-12: qty 2

## 2013-10-12 MED ORDER — PREDNISONE 20 MG PO TABS: 40.0000 mg | ORAL_TABLET | Freq: Every day | ORAL | Status: DC

## 2013-10-12 MED ORDER — MOMETASONE FURO-FORMOTEROL FUM 200-5 MCG/ACT IN AERO: 2.0000 | INHALATION_SPRAY | Freq: Two times a day (BID) | RESPIRATORY_TRACT | Status: DC

## 2013-10-12 MED ORDER — LORATADINE 10 MG PO TABS: 10.0000 mg | ORAL_TABLET | Freq: Every day | ORAL | Status: DC

## 2013-10-12 MED ORDER — IPRATROPIUM-ALBUTEROL 0.5-2.5 (3) MG/3ML IN SOLN
3.0000 mL | Freq: Once | RESPIRATORY_TRACT | Status: AC
  Administered 2013-10-12: 3 mL via RESPIRATORY_TRACT
  Filled 2013-10-12: qty 3

## 2013-10-12 MED ORDER — BUDESONIDE-FORMOTEROL FUMARATE 160-4.5 MCG/ACT IN AERO: 2.0000 | INHALATION_SPRAY | Freq: Two times a day (BID) | RESPIRATORY_TRACT | Status: DC

## 2013-10-12 NOTE — Progress Notes (Addendum)
   CARE MANAGEMENT ED NOTE 10/12/2013  Patient:  Anne Schaefer, Anne Schaefer   Account Number:  [id number]  Date Initiated:  10/12/2013  Documentation initiated by:  [facility]  Subjective/Objective Assessment:   Pt presented to [facility] ED with CP and wheezing     Subjective/Objective Assessment Detail:     Action/Plan:   Medication assistance   Action/Plan Detail:   Anticipated DC Date:  10/12/2013     Status Recommendation to Physician:   Result of Recommendation:  Agreed    DC Planning Services  CM consult  Medication Assistance    Choice offered to / List presented to:  C-1 Patient          Status of service:  Completed, signed off  ED Comments:   ED Comments Detail:  10/12/13 [address] RN BSN [phone] ED CM received consult from L.Jerline Pain PA-C regarding medication assistance. Pt presented to [facility] ED with CP and wheezing. Hx of Asthma  Pt has had 6 visits within 6 months. Pt is established [place]. Met with patient at bedside, patient reports that she is unable to afford her medications. Discussed the [facility] pharmacy medication assistance. Pt was unaware of the pricing at pharmacy. Pt being sent home on Symbicort spoke with Harvie Heck switching to [facility] because [facility] pharmacy cannot provide assistance with Symbicort. Contacted the [facility] pharmacy to notify them that patient will be coming over after discharge. No further CM needs identified.

## 2013-10-12 NOTE — ED Provider Notes (Signed)
CSN: [id number]     Arrival date & time 10/12/13  1119 History   First MD Initiated Contact with Patient 10/12/13 1132     Chief Complaint  Patient presents with  . Chest Pain     (Consider location/radiation/quality/duration/timing/severity/associated sxs/prior Treatment) HPI Comments: Anne Schaefer is a 43 y.o. female with a PMH of asthma, seasonal allergies, diverticulitis, ovarian cyst, who presents to the ED for evaluation of chest discomfort for 4 days.  She reports chest discomfort with productive cough. The patient reports wheezing for several days, she states she ran out of her albuterol in hal[place] approximately 3 days ago. She reports associated nasal congestion, bilateral eye itching.  Denies fever, chills, SOB or sick contact.  Patient is a 43 y.o. female presenting with chest pain. The history is provided by the patient and a significant other. No language interpreter was used.  Chest Pain Associated symptoms: cough   Associated symptoms: no abdominal pain and no fever     Past Medical History  Diagnosis Date  . Asthma   . Obesity   . Seasonal allergies   . Diverticulitis   . Ovarian cyst   . Infection     trich   Past Surgical History  Procedure Laterality Date  . Tubal ligation    . Cesarean section    . Forehead reconstruction      as a child   Family History  Problem Relation Age of Onset  . Diabetes Mother   . Heart disease Mother     chf  . Hypertension Father    History  Substance Use Topics  . Smoking status: Current Some Day Smoker -- 0.25 packs/day for 8 years    Types: Cigarettes  . Smokeless tobacco: Never Used     Comment: 3-4 cigs per day  . Alcohol Use: 0.0 oz/week     Comment: occasion   OB History   Grav Para Term Preterm Abortions TAB SAB Ect Mult Living   5 5 4 1  0 0 0 0 0 5     Review of Systems  Constitutional: Negative for fever and chills.  HENT: Positive for congestion and sneezing.   Eyes: Positive for itching.   Respiratory: Positive for cough, chest tightness and wheezing.   Cardiovascular: Positive for chest pain.  Gastrointestinal: Negative for abdominal pain.  Skin: Negative for rash.      Allergies  Ibuprofen; Azithromycin; and Other  Home Medications   Current Outpatient Rx  Name  Route  Sig  Dispense  Refill  . acetaminophen (TYLENOL) 500 MG tablet   Oral   Take 1,000 mg by mouth every 8 (eight) hours as needed for mild pain or headache.          . albuterol (PROVENTIL HFA;VENTOLIN HFA) 108 (90 BASE) MCG/ACT inhaler   Inhalation   Inhale 1-2 puffs into the lungs every 6 (six) hours as needed for wheezing or shortness of breath.         . budesonide-formoterol (SYMBICORT) 160-4.5 MCG/ACT inhaler   Inhalation   Inhale 2 puffs into the lungs 2 (two) times daily.         . ferrous sulfate 325 (65 FE) MG tablet   Oral   Take 325 mg by mouth daily with breakfast.          BP 144/83  Pulse 84  Temp(Src) 98 F (36.7 C) (Oral)  Resp 16  Ht 5\' 2"  (1.575 m)  Wt 230 lb (104.327 kg)  BMI 42.06 kg/m2  SpO2 99% Physical Exam  Nursing note and vitals reviewed. Constitutional: She is oriented to person, place, and time. She appears well-developed and well-nourished. No distress.  HENT:  Head: Normocephalic and atraumatic.  Nose: Rhinorrhea present.  Mouth/Throat: Uvula is midline. Posterior oropharyngeal erythema present. No oropharyngeal exudate or tonsillar abscesses.  Eyes: Right eye exhibits no discharge. Left eye exhibits no discharge.  Neck: Neck supple.  Cardiovascular: Normal rate and regular rhythm.   Pulmonary/Chest: Effort normal. No accessory muscle usage. Not tachypneic. No respiratory distress. She has no decreased breath sounds. She has wheezes. She has no rales. She exhibits no tenderness.  Patient is able to speak in complete sentences. Mild expiratory wheezing bilaterally.  Abdominal: Soft. There is no tenderness. There is no rebound and no guarding.   Neurological: She is alert and oriented to person, place, and time.  Skin: Skin is warm and dry. She is not diaphoretic.  Psychiatric: She has a normal mood and affect. Her behavior is normal.    ED Course  Procedures (including critical care time) Labs Review Labs Reviewed - No data to display Imaging Review No results found.   EKG Interpretation   Date/Time:  Friday October 12 2013 11:25:53 EDT Ventricular Rate:  87 PR Interval:  148 QRS Duration: 84 QT Interval:  406 QTC Calculation: 488 R Axis:   91 Text Interpretation:  Normal sinus rhythm Rightward axis Prolonged QT  Abnormal ECG Similar to prior Confirmed by Mingo Amber  MD, [place] ([id number]) on  10/12/2013 11:30:11 AM      MDM   Final diagnoses:  Wheezing  H/O seasonal allergies   Pt presents with wheezing. Out of home albuterol, also complains of seasonal allergy symptoms.  duoneb ordered. Re-eval: Pt with minimal expiratory wheezing bilaterally. Discussed treatment plan with the patient. Return precautions given. Reports understanding and no other concerns at this time.  Patient is stable for discharge at this time. Discussed with case manager for assistance with medicaiton.  Meds given in ED:  Medications  ipratropium-albuterol (DUONEB) 0.5-2.5 (3) MG/3ML nebulizer solution 3 mL (3 mLs Nebulization Given 10/12/13 1154)  predniSONE (DELTASONE) tablet 40 mg (40 mg Oral Given 10/12/13 1154)    Discharge Medication List as of 10/12/2013  1:09 PM    START taking these medications   Details  mometasone-formoterol (DULERA) 200-5 MCG/ACT AERO Inhale 2 puffs into the lungs 2 (two) times daily., Starting 10/12/2013, Until Discontinued, Print      Prednisone 40 mg for 4 days albuterol (PROVENTIL HFA;VENTOLIN HFA) 108 (90 BASE) MCG/ACT inhaler     loratadine (CLARITIN) 10 MG tablet          [name], PA-C 10/13/13 1340

## 2013-10-12 NOTE — ED Notes (Signed)
Social work at bedside.  

## 2013-10-12 NOTE — Discharge Instructions (Signed)
Call for a follow up appointment with a Family or Primary Care Provider.  Return to the Emergency Department if Symptoms worsen.   Take medication as prescribed.    Emergency Department Resource Guide 1) Find a Doctor and Pay Out of Pocket Although you won't have to find out who is covered by your insurance plan, it is a good idea to ask around and get recommendations. You will then need to call the office and see if the doctor you have chosen will accept you as a new patient and what types of options they offer for patients who are self-pay. Some doctors offer discounts or will set up payment plans for their patients who do not have insurance, but you will need to ask so you aren't surprised when you get to your appointment.  2) Contact Your Local Health Department Not all health departments have doctors that can see patients for sick visits, but many do, so it is worth a call to see if yours does. If you don't know where your local health department is, you can check in your phone book. The CDC also has a tool to help you locate your state's health department, and many state websites also have listings of all of their local health departments.  3) Find a [facility] If your illness is not likely to be very severe or complicated, you may want to try a walk in clinic. These are popping up all over the country in pharmacies, drugstores, and shopping centers. They're usually staffed by nurse practitioners or physician assistants that have been trained to treat common illnesses and complaints. They're usually fairly quick and inexpensive. However, if you have serious medical issues or chronic medical problems, these are probably not your best option.  No Primary Care Doctor: - Call Health Connect at  [phone] - they can help you locate a primary care doctor that  accepts your insurance, provides certain services, etc. - Physician Referral Service- [phone]  Chronic Pain  Problems: Organization         Address  Phone   Notes  [facility]  [phone] Patients need to be referred by their primary care doctor.   Medication Assistance: Organization         Address  Phone   Notes  [facility] Medication [facility] [address] [place], [place] [address] [phone] --Must be a resident of [facility] -- Must have NO insurance coverage whatsoever (no Medicaid/ Medicare, etc.) -- The pt. MUST have a primary care doctor that directs their care regularly and follows them in the community   MedAssist  [phone]   [employer]  [phone]    Agencies that provide inexpensive medical care: Organization         Address  Phone   Notes  [place]  [phone]   [facility]    [phone]   [facility] [place], [place] [address] [phone]   [place] [address] [place] [phone]   Planned Parenthood    [phone]   [facility]    [phone]   [place] and [place] [address], [place] Phone:  [phone], Fax:  [phone] Hours of Operation:  9 am - 6 pm, M-F.  Also accepts Medicaid/Medicare and self-pay.  [facility] for [place] [address]  [place], [place], [place] Phone: [phone], Fax: [phone]. Hours of Operation:  8:30 am - 5:30 pm, M-F.  Also accepts Medicaid and self-pay.  [facility] [place] [address], [place] Phone: [phone]   [place], [place], [place] [phone], Ext. [phone] Mondays & Thursdays: 7-9 AM.  First 15 patients are seen on a first come, first serve basis.    [place] Providers:  Organization         Address  Phone   Notes  [facility] [address], [place] [phone] Also  accepts self-pay patients.  [facility] [address], [place]  [phone]   [place], [address], [place] [phone]   [facility] [address] [place] [phone]   Lucianne Lei [address], [place]   [phone] Only accepts [place] Access [place] patients after they have their name applied to their card.   Self-Pay (no insurance) in [facility]:  Organization         Address  Phone   Notes  Sickle Cell Patients, [facility] [place] [phone]   [facility] [place] [phone]   [facility] [place]  [place], [place],  [phone]   [facility]/Dr. Osei-Bonsu  [address] [place] or [address], [place] [phone] Phone number for both [place] and [place] locations is the same.  Urgent Medical and [facility] [address], [place] [phone]   [facility] [address] [place] or [address] [phone]   [facility] [address], [place] [phone], phone; [phone], fax Sees patients 1st and 3rd Saturday of every month.  Must not qualify for public or private insurance (i.e. Medicaid, Medicare, [place] Health Choice, Veterans' Benefits)  Household income should be no more than 200% of the poverty level The clinic cannot treat you if you are pregnant or think you are pregnant  Sexually transmitted diseases are not treated at the clinic.    Dental Care: Organization         Address  Phone  Notes  [facility] Department of [facility] [place] [phone] Accepts children up to age 34 who are enrolled in [place] or [place]; pregnant  women with a Medicaid card; and children who have applied for Medicaid or [place] Health Choice, but were declined, whose parents can pay a reduced fee at time of service.  [facility] Department of [facility]  [address], [place] [phone] Accepts children up to age 24 who are enrolled in [place] or [place]; pregnant women with a Medicaid card; and children who have applied for Medicaid or [place] Health Choice, but were declined, whose parents can pay a reduced fee at time of service.  [place] [facility] Adult Dental Access PROGRAM  [place] [phone] Patients are seen by appointment only. Walk-ins are not accepted. [place] will see patients 57 years of age and older. Monday - Tuesday (8am-5pm) Most Wednesdays (8:30-5pm) $30 per visit, cash only  [place]  [address], [place] [phone] Patients are seen by appointment only. Walk-ins are not accepted. [place] will see patients 47 years of age and older. One Wednesday Evening (Monthly: Volunteer Based).  $30 per visit, cash only  [place]  [phone] for adults; Children under age 20, call Graduate Pediatric Dentistry at [phone]. Children aged 75-14, please call [phone] to request a pediatric application.  Dental services are provided in all areas of dental care including fillings, crowns and bridges, complete and partial dentures, implants, gum treatment, root canals, and extractions. Preventive care is also provided. Treatment is provided to both adults and children. Patients are selected via a lottery and there is often a waiting list.   [facility] [address], [place]  [phone] [email or web address]   [facility] [address] [place], [place] [phone], Ext. [phone] Second and Fourth Thursday of each month, opens at 6:30 AM; Clinic ends at 9 AM.  Patients are  seen on a first-come first-served basis, and a limited number are seen during each clinic.   [facility]  [address] Hillard Danker [place], [place] [phone]   Eligibility Requirements You must have lived in [place], [place], or [place] counties for at least the last three months.   You cannot be eligible for state or federal sponsored [employer], including [employer], [place], or [employer].   You generally cannot be eligible for healthcare insurance through your employer.    How to apply: Eligibility screenings are held every Tuesday and Wednesday afternoon from 1:00 pm until 4:00 pm. You do not need an appointment for the interview!  [facility] [place] [address] [place], [place]   [place]  [place] Department  [place]  [phone]    Behavioral Health Resources in the Community: Intensive Outpatient Programs Organization         Address  Phone  Notes  [place]. [address] [place], [place] [phone]   [facility] Outpatient [address] [place], [place]   ADS: Alcohol & Drug Svcs [address] [place], [place]   [place] [address]  [place], [place] or [phone]   Substance Abuse Resources Organization         Address  Phone  Notes  Alcohol and Drug Services  [phone]   [place]  [phone]   [facility] [place]   Chinita Pester  [phone]   Residential & Outpatient Substance Abuse Program  [phone]   Psychological Services Organization         Address  Phone  Notes  [facility] [place]  [place]  [phone]   [place] [address] [place] or [phone]    Mobile Crisis  Teams Organization         Address  Phone  Notes  [facility], Mobile Crisis Care Unit  [phone]   [facility]  [address] [place], [place]   [place] [address] [place] [phone]    Self-Help/Support Groups Organization         Address  Phone             Notes  [place]. of [place] -  variety of support groups  336- (509)111-5232 Call for more information  Narcotics Anonymous (NA), [facility] [address], [employer] [place]  2 meetings at this location   Residential Facilities manager         Address  Phone  Notes  [facility] Residential Treatment [place],    [place]  [phone]   [facility]  [address] [place] [address]27741, [place], [place]   [place], [place] [phone] Admissions: 8am-3pm M-F  Incentives Substance [place] [address],    [place], [place] [phone]   [facility] [address] [place], [place], [place]   [facility] [address],  [place], [place]   [facility] - Intensive Outpatient [address]een [address], [place], [place]   [facility] ([place].) [place].,  [place], [place] [phone] or [phone]   [facility] Services (RTS) [address] [place], [place] and [place] Accepts Medicaid  [facility] [address],  [place] [phone] Substance Abuse/Addiction Treatment   [facility] Organization         Address  Phone  Notes  [facility]  [phone]   Domenic Schwab, PhD [address] Arlis Porta [place], [place]   [phone] or [phone]   [place], [place] [phone]   [facility] [address], [place], [place] [phone]  Insurance/Medicaid/sponsorship through [facility] and Families [address], Ste [place]                                    [place], [place] [phone] [place] [address], [place] [phone]    Dr. Adele Schilder  [phone]   [facility] [place] Dept. 1) [address] [place] 2) [place] 3)  [place] [address], [place] [phone]  [phone]   [place] [phone] or [phone] (After Hours)

## 2013-10-12 NOTE — ED Notes (Signed)
Pt states since the weather has changed it has caused her asthma to be worse and shes feeling more SOB and tight in her chest. She is out of her inhaler

## 2013-10-15 NOTE — ED Provider Notes (Signed)
Medical screening examination/treatment/procedure(s) were conducted as a shared visit with non-physician practitioner(s) and myself.  I personally evaluated the patient during the encounter.   EKG Interpretation   Date/Time:  Friday October 12 2013 11:25:53 EDT Ventricular Rate:  87 PR Interval:  148 QRS Duration: 84 QT Interval:  406 QTC Calculation: 488 R Axis:   91 Text Interpretation:  Normal sinus rhythm Rightward axis Prolonged QT  Abnormal ECG Similar to prior Confirmed by Mingo Amber  MD, [place] ([id number]) on  10/12/2013 11:30:11 AM       Patient here with wheezing, cough, URI symptoms. Has ox asthma. Here with some wheezing, no acute respiratory distress - talking in complete sentences. Given steroids, albuterol, improved after albuterol therapy. Stable for discharge.  Osvaldo Shipper, MD 10/15/13 1515

## 2013-10-30 ENCOUNTER — Encounter (HOSPITAL_COMMUNITY): Payer: Self-pay | Admitting: Emergency Medicine

## 2013-10-30 ENCOUNTER — Emergency Department (HOSPITAL_COMMUNITY)
Admission: EM | Admit: 2013-10-30 | Discharge: 2013-10-30 | Disposition: A | Discharge status: Home / Self Care | Payer: Self-pay | Attending: Emergency Medicine | Admitting: Emergency Medicine

## 2013-10-30 DIAGNOSIS — J45909 Unspecified asthma, uncomplicated: Secondary | ICD-10-CM | POA: Insufficient documentation

## 2013-10-30 DIAGNOSIS — F17200 Nicotine dependence, unspecified, uncomplicated: Secondary | ICD-10-CM | POA: Insufficient documentation

## 2013-10-30 DIAGNOSIS — Z8719 Personal history of other diseases of the digestive system: Secondary | ICD-10-CM | POA: Insufficient documentation

## 2013-10-30 DIAGNOSIS — IMO0002 Reserved for concepts with insufficient information to code with codable children: Secondary | ICD-10-CM | POA: Insufficient documentation

## 2013-10-30 DIAGNOSIS — Z79899 Other long term (current) drug therapy: Secondary | ICD-10-CM | POA: Insufficient documentation

## 2013-10-30 DIAGNOSIS — R51 Headache: Secondary | ICD-10-CM | POA: Insufficient documentation

## 2013-10-30 DIAGNOSIS — N898 Other specified noninflammatory disorders of vagina: Secondary | ICD-10-CM | POA: Insufficient documentation

## 2013-10-30 DIAGNOSIS — R519 Headache, unspecified: Secondary | ICD-10-CM

## 2013-10-30 DIAGNOSIS — E669 Obesity, unspecified: Secondary | ICD-10-CM | POA: Insufficient documentation

## 2013-10-30 DIAGNOSIS — Z8742 Personal history of other diseases of the female genital tract: Secondary | ICD-10-CM | POA: Insufficient documentation

## 2013-10-30 DIAGNOSIS — Z8619 Personal history of other infectious and parasitic diseases: Secondary | ICD-10-CM | POA: Insufficient documentation

## 2013-10-30 MED ORDER — PROCHLORPERAZINE EDISYLATE 5 MG/ML IJ SOLN
10.0000 mg | Freq: Once | INTRAMUSCULAR | Status: AC
  Administered 2013-10-30: 10 mg via INTRAVENOUS
  Filled 2013-10-30: qty 2

## 2013-10-30 MED ORDER — SODIUM CHLORIDE 0.9 % IV BOLUS (SEPSIS)
1000.0000 mL | Freq: Once | INTRAVENOUS | Status: AC
  Administered 2013-10-30: 1000 mL via INTRAVENOUS

## 2013-10-30 MED ORDER — DIPHENHYDRAMINE HCL 50 MG/ML IJ SOLN
25.0000 mg | Freq: Once | INTRAMUSCULAR | Status: AC
  Administered 2013-10-30: 25 mg via INTRAVENOUS
  Filled 2013-10-30: qty 1

## 2013-10-30 NOTE — Discharge Instructions (Signed)
Read the information below.  You may return to the Emergency Department at any time for worsening condition or any new symptoms that concern you.   ° °You are having a headache. No specific cause was found today for your headache. It may have been a migraine or other cause of headache. Stress, anxiety, fatigue, and depression are common triggers for headaches. Your headache today does not appear to be life-threatening or require hospitalization, but often the exact cause of headaches is not determined in the emergency department. Therefore, follow-up with your doctor is very important to find out what may have caused your headache, and whether or not you need any further diagnostic testing or treatment. Sometimes headaches can appear benign (not harmful), but then more serious symptoms can develop which should prompt an immediate re-evaluation by your doctor or the emergency department. °SEEK MEDICAL ATTENTION IF: °You develop possible problems with medications prescribed.  °The medications don't resolve your headache, if it recurs , or if you have multiple episodes of vomiting or can't take fluids. °You have a change from the usual headache. °RETURN IMMEDIATELY IF you develop a sudden, severe headache or confusion, become poorly responsive or faint, develop a fever above 100.4F or problem breathing, have a change in speech, vision, swallowing, or understanding, or develop new weakness, numbness, tingling, incoordination, or have a seizure. ° ° °Headaches, Frequently Asked Questions °MIGRAINE HEADACHES °Q: What is migraine? What causes it? How can I treat it? °A: Generally, migraine headaches begin as a dull ache. Then they develop into a constant, throbbing, and pulsating pain. You may experience pain at the temples. You may experience pain at the front or back of one or both sides of the head. The pain is usually accompanied by a combination of: °· Nausea. °· Vomiting. °· Sensitivity to light and noise. °Some  people (about 15%) experience an aura (see below) before an attack. The cause of migraine is believed to be chemical reactions in the brain. Treatment for migraine may include over-the-counter or prescription medications. It may also include self-help techniques. These include relaxation training and biofeedback.  °Q: What is an aura? °A: About 15% of people with migraine get an "aura". This is a sign of neurological symptoms that occur before a migraine headache. You may see wavy or jagged lines, dots, or flashing lights. You might experience tunnel vision or blind spots in one or both eyes. The aura can include visual or auditory hallucinations (something imagined). It may include disruptions in smell (such as strange odors), taste or touch. Other symptoms include: °· Numbness. °· A "pins and needles" sensation. °· Difficulty in recalling or speaking the correct word. °These neurological events may last as long as 60 minutes. These symptoms will fade as the headache begins. °Q: What is a trigger? °A: Certain physical or environmental factors can lead to or "trigger" a migraine. These include: °· Foods. °· Hormonal changes. °· Weather. °· Stress. °It is important to remember that triggers are different for everyone. To help prevent migraine attacks, you need to figure out which triggers affect you. Keep a headache diary. This is a good way to track triggers. The diary will help you talk to your healthcare professional about your condition. °Q: Does weather affect migraines? °A: Bright sunshine, hot, humid conditions, and drastic changes in barometric pressure may lead to, or "trigger," a migraine attack in some people. But studies have shown that weather does not act as a trigger for everyone with migraines. °Q: What is   the link between migraine and hormones? °A: Hormones start and regulate many of your body's functions. Hormones keep your body in balance within a constantly changing environment. The levels of  hormones in your body are unbalanced at times. Examples are during menstruation, pregnancy, or menopause. That can lead to a migraine attack. In fact, about three quarters of all women with migraine report that their attacks are related to the menstrual cycle.  °Q: Is there an increased risk of stroke for migraine sufferers? °A: The likelihood of a migraine attack causing a stroke is very remote. That is not to say that migraine sufferers cannot have a stroke associated with their migraines. In persons under age 40, the most common associated factor for stroke is migraine headache. But over the course of a person's normal life span, the occurrence of migraine headache may actually be associated with a reduced risk of dying from cerebrovascular disease due to stroke.  °Q: What are acute medications for migraine? °A: Acute medications are used to treat the pain of the headache after it has started. Examples over-the-counter medications, NSAIDs, ergots, and triptans.  °Q: What are the triptans? °A: Triptans are the newest class of abortive medications. They are specifically targeted to treat migraine. Triptans are vasoconstrictors. They moderate some chemical reactions in the brain. The triptans work on receptors in your brain. Triptans help to restore the balance of a neurotransmitter called serotonin. Fluctuations in levels of serotonin are thought to be a main cause of migraine.  °Q: Are over-the-counter medications for migraine effective? °A: Over-the-counter, or "OTC," medications may be effective in relieving mild to moderate pain and associated symptoms of migraine. But you should see your caregiver before beginning any treatment regimen for migraine.  °Q: What are preventive medications for migraine? °A: Preventive medications for migraine are sometimes referred to as "prophylactic" treatments. They are used to reduce the frequency, severity, and length of migraine attacks. Examples of preventive medications  include antiepileptic medications, antidepressants, beta-blockers, calcium channel blockers, and NSAIDs (nonsteroidal anti-inflammatory drugs). °Q: Why are anticonvulsants used to treat migraine? °A: During the past few years, there has been an increased interest in antiepileptic drugs for the prevention of migraine. They are sometimes referred to as "anticonvulsants". Both epilepsy and migraine may be caused by similar reactions in the brain.  °Q: Why are antidepressants used to treat migraine? °A: Antidepressants are typically used to treat people with depression. They may reduce migraine frequency by regulating chemical levels, such as serotonin, in the brain.  °Q: What alternative therapies are used to treat migraine? °A: The term "alternative therapies" is often used to describe treatments considered outside the scope of conventional Western medicine. Examples of alternative therapy include acupuncture, acupressure, and yoga. Another common alternative treatment is herbal therapy. Some herbs are believed to relieve headache pain. Always discuss alternative therapies with your caregiver before proceeding. Some herbal products contain arsenic and other toxins. °TENSION HEADACHES °Q: What is a tension-type headache? What causes it? How can I treat it? °A: Tension-type headaches occur randomly. They are often the result of temporary stress, anxiety, fatigue, or anger. Symptoms include soreness in your temples, a tightening band-like sensation around your head (a "vice-like" ache). Symptoms can also include a pulling feeling, pressure sensations, and contracting head and neck muscles. The headache begins in your forehead, temples, or the back of your head and neck. Treatment for tension-type headache may include over-the-counter or prescription medications. Treatment may also include self-help techniques such as relaxation training and   biofeedback. °CLUSTER HEADACHES °Q: What is a cluster headache? What causes it?  How can I treat it? °A: Cluster headache gets its name because the attacks come in groups. The pain arrives with little, if any, warning. It is usually on one side of the head. A tearing or bloodshot eye and a runny nose on the same side of the headache may also accompany the pain. Cluster headaches are believed to be caused by chemical reactions in the brain. They have been described as the most severe and intense of any headache type. Treatment for cluster headache includes prescription medication and oxygen. °SINUS HEADACHES °Q: What is a sinus headache? What causes it? How can I treat it? °A: When a cavity in the bones of the face and skull (a sinus) becomes inflamed, the inflammation will cause localized pain. This condition is usually the result of an allergic reaction, a tumor, or an infection. If your headache is caused by a sinus blockage, such as an infection, you will probably have a fever. An x-ray will confirm a sinus blockage. Your caregiver's treatment might include antibiotics for the infection, as well as antihistamines or decongestants.  °REBOUND HEADACHES °Q: What is a rebound headache? What causes it? How can I treat it? °A: A pattern of taking acute headache medications too often can lead to a condition known as "rebound headache." A pattern of taking too much headache medication includes taking it more than 2 days per week or in excessive amounts. That means more than the label or a caregiver advises. With rebound headaches, your medications not only stop relieving pain, they actually begin to cause headaches. Doctors treat rebound headache by tapering the medication that is being overused. Sometimes your caregiver will gradually substitute a different type of treatment or medication. Stopping may be a challenge. Regularly overusing a medication increases the potential for serious side effects. Consult a caregiver if you regularly use headache medications more than 2 days per week or more than  the label advises. °ADDITIONAL QUESTIONS AND ANSWERS °Q: What is biofeedback? °A: Biofeedback is a self-help treatment. Biofeedback uses special equipment to monitor your body's involuntary physical responses. Biofeedback monitors: °· Breathing. °· Pulse. °· Heart rate. °· Temperature. °· Muscle tension. °· Brain activity. °Biofeedback helps you refine and perfect your relaxation exercises. You learn to control the physical responses that are related to stress. Once the technique has been mastered, you do not need the equipment any more. °Q: Are headaches hereditary? °A: Four out of five (80%) of people that suffer report a family history of migraine. Scientists are not sure if this is genetic or a family predisposition. Despite the uncertainty, a child has a 50% chance of having migraine if one parent suffers. The child has a 75% chance if both parents suffer.  °Q: Can children get headaches? °A: By the time they reach high school, most young people have experienced some type of headache. Many safe and effective approaches or medications can prevent a headache from occurring or stop it after it has begun.  °Q: What type of doctor should I see to diagnose and treat my headache? °A: Start with your primary caregiver. Discuss his or her experience and approach to headaches. Discuss methods of classification, diagnosis, and treatment. Your caregiver may decide to recommend you to a headache specialist, depending upon your symptoms or other physical conditions. Having diabetes, allergies, etc., may require a more comprehensive and inclusive approach to your headache. The [employer] will provide, upon   request, a list of NHF physician members in your state. °Document Released: 09/18/2003 Document Revised: 09/20/2011 Document Reviewed: 02/26/2008 °Exit[employer]® Patient Information ©2014 [employer]. ° °

## 2013-10-30 NOTE — ED Notes (Signed)
Pt from work, c/o headache to right side of head

## 2013-10-30 NOTE — ED Provider Notes (Signed)
CSN: [id number]     Arrival date & time 10/30/13  1607 History   First MD Initiated Contact with Patient 10/30/13 [phone]     Chief Complaint  Patient presents with  . Migraine     (Consider location/radiation/quality/duration/timing/severity/associated sxs/prior Treatment) HPI Patient presents with headache that has been ongoing intermittently x 5 days, gradually worsening, and constant since yesterday.  She has taken tylenol with temporarily relief.  Pain is currently 10/10, stabbing, throbbing, radiates down her right neck and into her right shoulder.  Turning head to left improves pain and turning head to right makes it worse.  No change with moving head up and down. She has taken no medications today.  Yesterday she had some blurry vision that resolved with blinking.  She is currently having her menstrual period, no abnormalities.  No recent illness.  No fever, no neck stiffness, no head trauma.  This is not the worst headache of her life but she does not have headaches often. Does have seasonal allergies.    Past Medical History  Diagnosis Date  . Asthma   . Obesity   . Seasonal allergies   . Diverticulitis   . Ovarian cyst   . Infection     trich   Past Surgical History  Procedure Laterality Date  . Tubal ligation    . Cesarean section    . Forehead reconstruction      as a child   Family History  Problem Relation Age of Onset  . Diabetes Mother   . Heart disease Mother     chf  . Hypertension Father    History  Substance Use Topics  . Smoking status: Current Some Day Smoker -- 0.25 packs/day for 8 years    Types: Cigarettes  . Smokeless tobacco: Never Used     Comment: 3-4 cigs per day  . Alcohol Use: 0.0 oz/week     Comment: occasion   OB History   Grav Para Term Preterm Abortions TAB SAB Ect Mult Living   5 5 4 1  0 0 0 0 0 5     Review of Systems  Constitutional: Negative for fever and chills.  HENT: Positive for congestion and sneezing. Negative for dental  problem, ear pain, mouth sores, rhinorrhea, sore throat and trouble swallowing.   Respiratory: Negative for cough, shortness of breath, wheezing and stridor.   Cardiovascular: Negative for chest pain.  Gastrointestinal: Negative for abdominal pain, diarrhea and constipation.  Genitourinary: Positive for vaginal bleeding. Negative for dysuria, urgency, frequency, vaginal discharge and menstrual problem.  Musculoskeletal: Negative for neck pain and neck stiffness.  Neurological: Positive for headaches. Negative for weakness and numbness.  All other systems reviewed and are negative.     Allergies  Ibuprofen; Azithromycin; and Other  Home Medications   Prior to Admission medications   Medication Sig Start Date End Date Taking? Authorizing Provider  acetaminophen (TYLENOL) 500 MG tablet Take 1,000 mg by mouth every 8 (eight) hours as needed for mild pain or headache.     Historical Provider, MD  albuterol (PROVENTIL HFA;VENTOLIN HFA) 108 (90 BASE) MCG/ACT inhaler Inhale 1-2 puffs into the lungs every 6 (six) hours as needed for wheezing or shortness of breath. 10/12/13   Lauren Burnetta Sabin, PA-C  ferrous sulfate 325 (65 FE) MG tablet Take 325 mg by mouth daily with breakfast.    Historical Provider, MD  loratadine (CLARITIN) 10 MG tablet Take 1 tablet (10 mg total) by mouth daily. 10/12/13   Jacquelynn Cree  Parker, PA-C  mometasone-formoterol (DULERA) 200-5 MCG/ACT AERO Inhale 2 puffs into the lungs 2 (two) times daily. 10/12/13   Lauren Burnetta Sabin, PA-C  predniSONE (DELTASONE) 20 MG tablet Take 2 tablets (40 mg total) by mouth daily. 10/12/13   Lauren Burnetta Sabin, PA-C   BP 154/99  Pulse 72  Temp(Src) 98.4 F (36.9 C) (Oral)  Resp 24  SpO2 100%  LMP 10/26/2013 Physical Exam  Nursing note and vitals reviewed. Constitutional: She appears well-developed and well-nourished. No distress.  HENT:  Head: Normocephalic and atraumatic.  Eyes: Conjunctivae and EOM are normal. Right eye exhibits no discharge. Left  eye exhibits no discharge.  Neck: Normal range of motion. Neck supple.  Cardiovascular: Normal rate and regular rhythm.   Pulmonary/Chest: Effort normal and breath sounds normal. No respiratory distress. She has no wheezes. She has no rales.  Abdominal: Soft. She exhibits no distension. There is no tenderness. There is no rebound and no guarding.  Musculoskeletal:       Cervical back: She exhibits no bony tenderness.       Back:  Neurological: She is alert.  CN II-XII intact, EOMs intact, no pronator drift, grip strengths equal bilaterally; strength 5/5 in all extremities, sensation intact in all extremities; finger to nose, heel to shin, rapid alternating movements normal; gait is normal.     Skin: She is not diaphoretic.  Psychiatric: She has a normal mood and affect. Her behavior is normal.    ED Course  Procedures (including critical care time) Labs Review Labs Reviewed - No data to display  Imaging Review No results found.   EKG Interpretation None      11:07 AM Pt reports complete relief of her headache after benadryl, compazine, IVF.    MDM   Final diagnoses:  Headache    Afebrile nontoxic patient with headache. Neurologically intact. No red flags. Suspect tension headache given muscular involvement along right paraspinal muscles and trapezius and no other associated symptoms.   Patient with complete relief after IV fluids, Benadryl, Compazine. Discharged home with primary care followup.  Discussed findings, treatment, and follow up  with patient.  Pt given return precautions.  Pt verbalizes understanding and agrees with plan.        Clayton Bibles, PA-C 10/30/13 1110

## 2013-10-30 NOTE — Progress Notes (Signed)
  CARE MANAGEMENT ED NOTE 10/30/2013  Patient:  Anne Schaefer, Anne Schaefer   Account Number:  [id number]  Date Initiated:  10/30/2013  Documentation initiated by:  Edwyna Shell  Subjective/Objective Assessment:   43 yo female presenting to the ED with c/o headache     Subjective/Objective Assessment Detail:   Patient presents with headache that has been ongoing intermittently x 5 days, gradually worsening, and constant since yesterday.  She has taken tylenol with temporarily relief.  Pain is currently 10/10, stabbing, throbbing, radiates down her right neck and into her right shoulder. Turning head to left improves pain and turning head to right makes it worse.  No change with moving head up and down. She has taken no medications today.  Yesterday she had some blurry vision that resolved with blinking.  She is currently having her menstrual period, no abnormalities. No recent illness.  No fever, no neck stiffness, no head trauma.  This is not the worst headache of her life but she does not have headaches often. Does have seasonal allergies.     Action/Plan:   Action/Plan Detail:   Anticipated DC Date:       Status Recommendation to Physician:   Result of Recommendation:      DC Planning Services  Other    Choice offered to / List presented to:  C-1 Patient          Status of service:  Completed, signed off  ED Comments:   ED Comments Detail:  CM provided patient with 5 page resource list for [place] and also informed patient of [facility] walk-in hours for established patients which she stated she was unaware of. Asked patient if she has a follow up appointment scheduled at the [facility] and she stated she did not. Explained that after ED discharge she should make a follow up appointment to better manage her medical care and for any medication refills or prescriptions.

## 2013-10-30 NOTE — ED Provider Notes (Signed)
Medical screening examination/treatment/procedure(s) were performed by non-physician practitioner and as supervising physician I was immediately available for consultation/collaboration.   EKG Interpretation None        Delice Bison Tameron Lama, DO 10/30/13 1529

## 2013-11-23 ENCOUNTER — Encounter (HOSPITAL_COMMUNITY): Payer: Self-pay | Admitting: Emergency Medicine

## 2013-11-23 ENCOUNTER — Emergency Department (HOSPITAL_COMMUNITY): Payer: Self-pay

## 2013-11-23 ENCOUNTER — Emergency Department (HOSPITAL_COMMUNITY)
Admission: EM | Admit: 2013-11-23 | Discharge: 2013-11-24 | Disposition: A | Discharge status: Home / Self Care | Payer: Self-pay | Attending: Emergency Medicine | Admitting: Emergency Medicine

## 2013-11-23 DIAGNOSIS — Z8719 Personal history of other diseases of the digestive system: Secondary | ICD-10-CM | POA: Insufficient documentation

## 2013-11-23 DIAGNOSIS — R05 Cough: Secondary | ICD-10-CM | POA: Insufficient documentation

## 2013-11-23 DIAGNOSIS — Z8742 Personal history of other diseases of the female genital tract: Secondary | ICD-10-CM | POA: Insufficient documentation

## 2013-11-23 DIAGNOSIS — Z79899 Other long term (current) drug therapy: Secondary | ICD-10-CM | POA: Insufficient documentation

## 2013-11-23 DIAGNOSIS — IMO0002 Reserved for concepts with insufficient information to code with codable children: Secondary | ICD-10-CM | POA: Insufficient documentation

## 2013-11-23 DIAGNOSIS — J45909 Unspecified asthma, uncomplicated: Secondary | ICD-10-CM | POA: Insufficient documentation

## 2013-11-23 DIAGNOSIS — F17200 Nicotine dependence, unspecified, uncomplicated: Secondary | ICD-10-CM | POA: Insufficient documentation

## 2013-11-23 DIAGNOSIS — M940 Chondrocostal junction syndrome [Tietze]: Secondary | ICD-10-CM | POA: Insufficient documentation

## 2013-11-23 DIAGNOSIS — Z9851 Tubal ligation status: Secondary | ICD-10-CM | POA: Insufficient documentation

## 2013-11-23 DIAGNOSIS — Z9889 Other specified postprocedural states: Secondary | ICD-10-CM | POA: Insufficient documentation

## 2013-11-23 DIAGNOSIS — E669 Obesity, unspecified: Secondary | ICD-10-CM | POA: Insufficient documentation

## 2013-11-23 DIAGNOSIS — Z8619 Personal history of other infectious and parasitic diseases: Secondary | ICD-10-CM | POA: Insufficient documentation

## 2013-11-23 DIAGNOSIS — R059 Cough, unspecified: Secondary | ICD-10-CM | POA: Insufficient documentation

## 2013-11-23 MED ORDER — ALBUTEROL SULFATE (2.5 MG/3ML) 0.083% IN NEBU
5.0000 mg | INHALATION_SOLUTION | Freq: Once | RESPIRATORY_TRACT | Status: AC
  Administered 2013-11-23: 5 mg via RESPIRATORY_TRACT
  Filled 2013-11-23: qty 6

## 2013-11-23 MED ORDER — TRAMADOL HCL 50 MG PO TABS: 50.0000 mg | ORAL_TABLET | Freq: Four times a day (QID) | ORAL | Status: DC | PRN

## 2013-11-23 MED ORDER — IPRATROPIUM BROMIDE 0.02 % IN SOLN
0.5000 mg | Freq: Once | RESPIRATORY_TRACT | Status: AC
  Administered 2013-11-23: 0.5 mg via RESPIRATORY_TRACT
  Filled 2013-11-23: qty 2.5

## 2013-11-23 MED ORDER — TRAMADOL HCL 50 MG PO TABS
50.0000 mg | ORAL_TABLET | Freq: Once | ORAL | Status: AC
  Administered 2013-11-23: 50 mg via ORAL
  Filled 2013-11-23: qty 1

## 2013-11-23 NOTE — ED Notes (Signed)
Pt arrived to the ED with a complaint of right sided flank pain.  Pt states that when she lies on her right side she feel a heaviness present in her right flank.  Pt has a hx of asthma but has taken her medications and is not complaining of shortness of breath.

## 2013-11-23 NOTE — ED Provider Notes (Signed)
CSN: [id number]     Arrival date & time 11/23/13  2021 History   First MD Initiated Contact with Patient 11/23/13 2055     Chief Complaint  Patient presents with  . Flank Pain     (Consider location/radiation/quality/duration/timing/severity/associated sxs/prior Treatment) HPI  43 year old obese female with history of ovarian cyst, asthma, seasonal allergy presents c/o pain to R side of chest.  Patient states for the past 2 days she has been experiencing pain to the right side of her chest radiates down to her abdomen. Pain is described as a sharp sensation, worsening with movement or when she lays on her left side and improves when she lays on her right side. She endorses occasional cough with worsening pain with cough. 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 She takes Tylenol with minimal improvement. She denies any fever, chills, headache, shortness of breath, hemoptysis, dysuria, hematuria, nausea vomiting diarrhea, or rash. She denies any recent trauma. She has no prior history of PE DVT, no recent surgery, prolonged bed rest, unilateral leg swelling or calf pain, hemoptysis, history of cancer, or taking birth control pill. She has a history of asthma but felt that this is not related to asthma exacerbation she has been taking her medication as prescribed and is well-controlled.  Past Medical History  Diagnosis Date  . Asthma   . Obesity   . Seasonal allergies   . Diverticulitis   . Ovarian cyst   . Infection     trich   Past Surgical History  Procedure Laterality Date  . Tubal ligation    . Cesarean section    . Forehead reconstruction      as a child   Family History  Problem Relation Age of Onset  . Diabetes Mother   . Heart disease Mother     chf  . Hypertension Father    History  Substance Use Topics  . Smoking status: Current Some Day Smoker -- 0.25 packs/day for 8 years    Types:  Cigarettes  . Smokeless tobacco: Never Used     Comment: 3-4 cigs per day  . Alcohol Use: 0.0 oz/week     Comment: occasion   OB History   Grav Para Term Preterm Abortions TAB SAB Ect Mult Living   5 5 4 1  0 0 0 0 0 5     Review of Systems  All other systems reviewed and are negative.     Allergies  Ibuprofen; Azithromycin; and Other  Home Medications   Prior to Admission medications   Medication Sig Start Date End Date Taking? Authorizing Provider  acetaminophen (TYLENOL) 500 MG tablet Take 1,000 mg by mouth every 8 (eight) hours as needed for mild pain.    Yes Historical Provider, MD  albuterol (PROVENTIL HFA;VENTOLIN HFA) 108 (90 BASE) MCG/ACT inhaler Inhale 1-2 puffs into the lungs every 6 (six) hours as needed for wheezing or shortness of breath. 10/12/13  Yes Lauren Burnetta Sabin, PA-C  loratadine (CLARITIN) 10 MG tablet Take 1 tablet (10 mg total) by mouth daily. 10/12/13  Yes Lauren Burnetta Sabin, PA-C  mometasone-formoterol (DULERA) 200-5 MCG/ACT AERO Inhale 2 puffs into the lungs 2 (two) times daily. 10/12/13  Yes Lauren Burnetta Sabin, PA-C   BP 155/95  Pulse 84  Temp(Src) 98.1 F (36.7 C) (Oral)  Resp 18  Ht 5\' 2"  (1.575 m)  Wt 235 lb (106.595 kg)  BMI 42.97 kg/m2  SpO2 100%  LMP 11/18/2013 Physical Exam  Nursing note and vitals reviewed. Constitutional: She appears well-developed and well-nourished. No distress.  Moderately obese African American female appears to be in no acute distress  HENT:  Head: Atraumatic.  Eyes: Conjunctivae are normal.  Neck: Neck supple.  Cardiovascular: Normal rate and regular rhythm.   Pulmonary/Chest: Effort normal. She exhibits tenderness (Tenderness to right lateral ribs on palpation, increasing pain with lateral rotation. No emphysema or crepitus noted. No rash noted.).  Abdominal: Soft. Bowel sounds are normal. There is no tenderness.  Genitourinary:  No CVA tenderness.  Musculoskeletal: She exhibits no edema.  Neurological: She is alert.   Skin: No rash noted.  Psychiatric: She has a normal mood and affect.    ED Course  Procedures (including critical care time)  Patient has reproducible pain to the right side of chest suggestive of costochondritis unlikely muscle skeletal in origin. Given that she has some pleuritic chest pain cough, will obtain chest x-ray. She has no CVA tenderness concerning for kidney stones. Abdomen is soft and nontender on exam. She is currently in no acute distress. Tramadol given for pain.  10:30 PM Chest x-ray show a stable small right fluid thickening with adjacent atelectasis or scarring. This finding is similar to prior chest x-ray that patient has. I have low suspicion for empyema causing her symptoms. I believe her pain is likely musculoskeletal. Patient notified that she is having increase wheezing. On examination, patient does have mild expiratory and expiratory wheezes. She does have history of asthma. We'll give albuterol and Atrovent nebulizer.  11:49 PM Wheezing improves after the use albuterol and Atrovent nebulizer. Patient felt much better. Patient stable for discharge with close followup with PCP.  Labs Review Labs Reviewed - No data to display  Imaging Review Dg Chest 2 View  11/23/2013   CLINICAL DATA:  Right lateral chest pain and shortness of breath  EXAM: CHEST  2 VIEW  COMPARISON:  06/10/2013  FINDINGS: Right pleural fluid/ thickening reidentified with adjacent scarring or atelectasis. Heart size at upper limits of normal. Left lung is clear. No acute osseous finding.  IMPRESSION: Stable small right pleural fluid/thickening with adjacent atelectasis or scarring.   Electronically Signed   By: Conchita Paris M.D.   On: 11/23/2013 21:57     EKG Interpretation None      MDM   Final diagnoses:  Costochondritis    BP 173/92  Pulse 77  Temp(Src) 99 F (37.2 C) (Oral)  Resp 20  Ht 5\' 2"  (1.575 m)  Wt 235 lb (106.595 kg)  BMI 42.97 kg/m2  SpO2 97%  LMP 11/18/2013  Pt  made aware of elevated BP and will need to have it recheck by PCP.   I have reviewed nursing notes and vital signs. I personally reviewed the imaging tests through PACS system  I reviewed available ER/hospitalization records thought the EMR     Domenic Moras, [place] 11/23/13 2349

## 2013-11-23 NOTE — Discharge Instructions (Signed)

## 2013-11-23 NOTE — ED Provider Notes (Signed)
Medical screening examination/treatment/procedure(s) were performed by non-physician practitioner and as supervising physician I was immediately available for consultation/collaboration.   EKG Interpretation None       Jasper Riling. Alvino Chapel, MD 11/23/13 [phone]

## 2013-11-29 ENCOUNTER — Emergency Department (HOSPITAL_COMMUNITY)
Admission: EM | Admit: 2013-11-29 | Discharge: 2013-11-29 | Disposition: A | Discharge status: Home / Self Care | Payer: Self-pay | Attending: Emergency Medicine | Admitting: Emergency Medicine

## 2013-11-29 ENCOUNTER — Encounter (HOSPITAL_COMMUNITY): Payer: Self-pay | Admitting: Emergency Medicine

## 2013-11-29 DIAGNOSIS — IMO0002 Reserved for concepts with insufficient information to code with codable children: Secondary | ICD-10-CM | POA: Insufficient documentation

## 2013-11-29 DIAGNOSIS — R05 Cough: Secondary | ICD-10-CM

## 2013-11-29 DIAGNOSIS — J45901 Unspecified asthma with (acute) exacerbation: Secondary | ICD-10-CM | POA: Insufficient documentation

## 2013-11-29 DIAGNOSIS — R059 Cough, unspecified: Secondary | ICD-10-CM

## 2013-11-29 DIAGNOSIS — F17200 Nicotine dependence, unspecified, uncomplicated: Secondary | ICD-10-CM | POA: Insufficient documentation

## 2013-11-29 DIAGNOSIS — Z79899 Other long term (current) drug therapy: Secondary | ICD-10-CM | POA: Insufficient documentation

## 2013-11-29 DIAGNOSIS — E669 Obesity, unspecified: Secondary | ICD-10-CM | POA: Insufficient documentation

## 2013-11-29 DIAGNOSIS — Z8619 Personal history of other infectious and parasitic diseases: Secondary | ICD-10-CM | POA: Insufficient documentation

## 2013-11-29 DIAGNOSIS — Z8719 Personal history of other diseases of the digestive system: Secondary | ICD-10-CM | POA: Insufficient documentation

## 2013-11-29 DIAGNOSIS — Z8742 Personal history of other diseases of the female genital tract: Secondary | ICD-10-CM | POA: Insufficient documentation

## 2013-11-29 MED ORDER — IPRATROPIUM BROMIDE 0.02 % IN SOLN
0.5000 mg | Freq: Once | RESPIRATORY_TRACT | Status: AC
  Administered 2013-11-29: 0.5 mg via RESPIRATORY_TRACT
  Filled 2013-11-29: qty 2.5

## 2013-11-29 MED ORDER — PREDNISONE 20 MG PO TABS: 40.0000 mg | ORAL_TABLET | Freq: Every day | ORAL | Status: DC

## 2013-11-29 MED ORDER — ALBUTEROL SULFATE (2.5 MG/3ML) 0.083% IN NEBU
5.0000 mg | INHALATION_SOLUTION | Freq: Once | RESPIRATORY_TRACT | Status: AC
  Administered 2013-11-29: 5 mg via RESPIRATORY_TRACT
  Filled 2013-11-29: qty 6

## 2013-11-29 MED ORDER — IPRATROPIUM-ALBUTEROL 0.5-2.5 (3) MG/3ML IN SOLN
3.0000 mL | Freq: Once | RESPIRATORY_TRACT | Status: AC
  Administered 2013-11-29: 3 mL via RESPIRATORY_TRACT
  Filled 2013-11-29: qty 3

## 2013-11-29 MED ORDER — PREDNISONE 20 MG PO TABS
60.0000 mg | ORAL_TABLET | Freq: Once | ORAL | Status: AC
  Administered 2013-11-29: 60 mg via ORAL
  Filled 2013-11-29: qty 3

## 2013-11-29 NOTE — Discharge Instructions (Signed)
Take the prescribed medication as directed starting tomorrow, you have already had today's dose. Follow-up with your primary care physician as needed. Return to the ED for new or worsening symptoms.

## 2013-11-29 NOTE — ED Provider Notes (Signed)
CSN: [id number]     Arrival date & time 11/29/13  1745 History  This chart was scribed for Anne Carnes, PA, working with Anne Essex, MD, by Anne Schaefer, ED Scribe. This patient was seen in room TR07C/TR07C and the patient's care was started at 6:00 PM.    Chief Complaint  Patient presents with  . Cough    The history is provided by the patient. No language interpreter was used.   HPI Comments: Anne Schaefer is a 43 y.o. female who presents to the Emergency Department complaining of gradually worsening, intermittent cough that began yesterday. Patient states that while at work today, her cough has gotten worse. She reports using her inhaler more often today than usual with no significant relief. There is associated congestion and wheezing.  No fevers or chills.  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  Denies chest pain, SOB, palpitations, dizziness, weakness,   Past Medical History  Diagnosis Date  . Asthma   . Obesity   . Seasonal allergies   . Diverticulitis   . Ovarian cyst   . Infection     trich   Past Surgical History  Procedure Laterality Date  . Tubal ligation    . Cesarean section    . Forehead reconstruction      as a child   Family History  Problem Relation Age of Onset  . Diabetes Mother   . Heart disease Mother     chf  . Hypertension Father    History  Substance Use Topics  . Smoking status: Current Some Day Smoker -- 0.25 packs/day for 8 years    Types: Cigarettes  . Smokeless tobacco: Never Used     Comment: 3-4 cigs per day  . Alcohol Use: 0.0 oz/week     Comment: occasion   OB History   Grav Para Term Preterm Abortions TAB SAB Ect Mult Living   5 5 4 1  0 0 0 0 0 5     Review of Systems  HENT: Positive for congestion.   Respiratory: Positive for cough and wheezing.   All other systems reviewed and are negative.     Allergies  Ibuprofen; Azithromycin; and Other  Home  Medications   Prior to Admission medications   Medication Sig Start Date End Date Taking? Authorizing Provider  acetaminophen (TYLENOL) 500 MG tablet Take 1,000 mg by mouth every 8 (eight) hours as needed for mild pain.     Historical Provider, MD  albuterol (PROVENTIL HFA;VENTOLIN HFA) 108 (90 BASE) MCG/ACT inhaler Inhale 1-2 puffs into the lungs every 6 (six) hours as needed for wheezing or shortness of breath. 10/12/13   Anne Burnetta Sabin, PA-C  loratadine (CLARITIN) 10 MG tablet Take 1 tablet (10 mg total) by mouth daily. 10/12/13   Anne Burnetta Sabin, PA-C  mometasone-formoterol (DULERA) 200-5 MCG/ACT AERO Inhale 2 puffs into the lungs 2 (two) times daily. 10/12/13   Anne Burnetta Sabin, PA-C  traMADol (ULTRAM) 50 MG tablet Take 1 tablet (50 mg total) by mouth every 6 (six) hours as needed for severe pain. 11/23/13   Anne Moras, PA-C   Triage Vitals: BP 162/88  Pulse 93  Temp(Src) 98.5 F (36.9 C) (Oral)  Resp 20  Wt 235 lb (106.595 kg)  SpO2 99%  LMP 11/18/2013  Physical Exam  Nursing note and vitals reviewed. Constitutional: She is oriented to person, place, and time. She appears well-developed and well-nourished. No  distress.  HENT:  Head: Normocephalic and atraumatic.  Right Ear: Tympanic membrane and ear canal normal.  Left Ear: Tympanic membrane and ear canal normal.  Nose: Nose normal.  Mouth/Throat: Uvula is midline, oropharynx is clear and moist and mucous membranes are normal. No oropharyngeal exudate, posterior oropharyngeal edema, posterior oropharyngeal erythema or tonsillar abscesses.  Postnasal drip present  Eyes: Conjunctivae and EOM are normal. Pupils are equal, round, and reactive to light.  Neck: Normal range of motion. Neck supple.  Cardiovascular: Normal rate, regular rhythm and normal heart sounds.   Pulmonary/Chest: Effort normal. No accessory muscle usage. Not tachypneic. No respiratory distress. She has wheezes. She has no rhonchi.  Normal work of breathing without  accessory muscle use or retractions, and expiratory wheezes throughout, speaking in full complete sentences without difficulty  Musculoskeletal: Normal range of motion.  Neurological: She is alert and oriented to person, place, and time.  Skin: Skin is warm and dry. She is not diaphoretic.  Psychiatric: She has a normal mood and affect.    ED Course  Procedures (including critical care time)  DIAGNOSTIC STUDIES: Oxygen Saturation is 99% on room air, normal by my interpretation.    COORDINATION OF CARE: At 1815 Discussed treatment plan with patient which includes prednisone, albuterol, and Atrovent. Patient agrees.   Labs Review Labs Reviewed - No data to display  Imaging Review No results found.   EKG Interpretation None      MDM   Final diagnoses:  Cough  Asthma exacerbation   43 year old female presenting with cough and increased wheezing. She denies chest pain, palpitations, or shortness of breath. She has been using her albuterol inhaler more frequently than normal today. On exam she does have diffuse expiratory wheezes without rhonchi. Patient given dose of prednisone and neb treatments. We'll reassess.  After 2 neb treatments lungs are clear bilaterally.  Patient's O2 sats have remained stable on room air and she is in no respiratory distress.  At this time i have low suspicion for ACS, PE, dissection, or other acute cardiac process at this time.  Patient will be discharged with prednisone taper. She will follow with her primary care physician.  Discussed plan with patient, he/she acknowledged understanding and agreed with plan of care.  Return precautions given for new or worsening symptoms.  I personally performed the services described in this documentation, which was scribed in my presence. The recorded information has been reviewed and is accurate.  Anne Pickett, PA-C 11/29/13 2015  Anne Pickett, PA-C 11/29/13 2016

## 2013-11-29 NOTE — ED Notes (Signed)
Pt reporting coughing since yesterday, having to use inhaler more often today. Pt is noted to be wheezing. VSS.

## 2013-11-30 NOTE — ED Provider Notes (Signed)
Medical screening examination/treatment/procedure(s) were performed by non-physician practitioner and as supervising physician I was immediately available for consultation/collaboration.   EKG Interpretation None        Gurdeep Keesey, MD 11/30/13 0037 

## 2013-12-26 ENCOUNTER — Telehealth: Payer: Self-pay

## 2013-12-26 NOTE — Telephone Encounter (Signed)
Placed call to patient for follow-up: discuss asthma control, symptoms, medications, and to discuss need for a PCP follow-up appointment.  Patient last in the ED on 11/29/13 for cough and asthma exacerbation, and patient has had 4 ED visits since last PCP appointment.  PCP appointment needed.  Unable to reach patient, no voicemail available to leave message.

## 2014-01-08 ENCOUNTER — Emergency Department (HOSPITAL_COMMUNITY)
Admission: EM | Admit: 2014-01-08 | Discharge: 2014-01-08 | Disposition: A | Discharge status: Home / Self Care | Payer: Self-pay | Attending: Emergency Medicine | Admitting: Emergency Medicine

## 2014-01-08 ENCOUNTER — Emergency Department (HOSPITAL_COMMUNITY): Payer: Self-pay

## 2014-01-08 ENCOUNTER — Encounter (HOSPITAL_COMMUNITY): Payer: Self-pay | Admitting: Emergency Medicine

## 2014-01-08 DIAGNOSIS — S8392XA Sprain of unspecified site of left knee, initial encounter: Secondary | ICD-10-CM

## 2014-01-08 DIAGNOSIS — Y929 Unspecified place or not applicable: Secondary | ICD-10-CM | POA: Insufficient documentation

## 2014-01-08 DIAGNOSIS — Z8719 Personal history of other diseases of the digestive system: Secondary | ICD-10-CM | POA: Insufficient documentation

## 2014-01-08 DIAGNOSIS — IMO0002 Reserved for concepts with insufficient information to code with codable children: Secondary | ICD-10-CM | POA: Insufficient documentation

## 2014-01-08 DIAGNOSIS — Z8742 Personal history of other diseases of the female genital tract: Secondary | ICD-10-CM | POA: Insufficient documentation

## 2014-01-08 DIAGNOSIS — E669 Obesity, unspecified: Secondary | ICD-10-CM | POA: Insufficient documentation

## 2014-01-08 DIAGNOSIS — W06XXXA Fall from bed, initial encounter: Secondary | ICD-10-CM | POA: Insufficient documentation

## 2014-01-08 DIAGNOSIS — Z8619 Personal history of other infectious and parasitic diseases: Secondary | ICD-10-CM | POA: Insufficient documentation

## 2014-01-08 DIAGNOSIS — Y9389 Activity, other specified: Secondary | ICD-10-CM | POA: Insufficient documentation

## 2014-01-08 DIAGNOSIS — F17200 Nicotine dependence, unspecified, uncomplicated: Secondary | ICD-10-CM | POA: Insufficient documentation

## 2014-01-08 DIAGNOSIS — J45909 Unspecified asthma, uncomplicated: Secondary | ICD-10-CM | POA: Insufficient documentation

## 2014-01-08 DIAGNOSIS — Z79899 Other long term (current) drug therapy: Secondary | ICD-10-CM | POA: Insufficient documentation

## 2014-01-08 MED ORDER — CYCLOBENZAPRINE HCL 10 MG PO TABS: 10.0000 mg | ORAL_TABLET | Freq: Two times a day (BID) | ORAL | Status: DC | PRN

## 2014-01-08 NOTE — ED Provider Notes (Signed)
CSN: [id number]     Arrival date & time 01/08/14  1519 History  This chart was scribed for Monico Blitz, PA-C working with Tanna Furry, MD by Randa Evens, ED Scribe. This patient was seen in room TR09C/TR09C and the patient's care was started at 5:06 PM.    Chief Complaint  Patient presents with  . Leg Pain   Patient is a 42 y.o. female presenting with leg pain. The history is provided by the patient. No language interpreter was used.  Leg Pain  HPI Comments: Anne Schaefer is a 43 y.o. female who presents to the Emergency Department complaining of left sided leg pain. She states she has also been having left sided knee pain. She states she has been taking tylenol with no relief. She state today she rolled out of bed an twisted her knee which made the pain worse.   Past Medical History  Diagnosis Date  . Asthma   . Obesity   . Seasonal allergies   . Diverticulitis   . Ovarian cyst   . Infection     trich   Past Surgical History  Procedure Laterality Date  . Tubal ligation    . Cesarean section    . Forehead reconstruction      as a child   Family History  Problem Relation Age of Onset  . Diabetes Mother   . Heart disease Mother     chf  . Hypertension Father    History  Substance Use Topics  . Smoking status: Current Some Day Smoker -- 0.25 packs/day for 8 years    Types: Cigarettes  . Smokeless tobacco: Never Used     Comment: 3-4 cigs per day  . Alcohol Use: 0.0 oz/week   OB History   Grav Para Term Preterm Abortions TAB SAB Ect Mult Living   5 5 4 1  0 0 0 0 0 5     Review of Systems  Musculoskeletal: Positive for arthralgias.   A complete 10 system review of systems was obtained and all systems are negative except as noted in the HPI and PMH.    Allergies  Ibuprofen; Azithromycin; and Other  Home Medications   Prior to Admission medications   Medication Sig Start Date End Date Taking? Authorizing Provider  acetaminophen (TYLENOL) 500 MG tablet  Take 1,000 mg by mouth every 8 (eight) hours as needed for mild pain.    Yes Historical Provider, MD  albuterol (PROVENTIL HFA;VENTOLIN HFA) 108 (90 BASE) MCG/ACT inhaler Inhale 1-2 puffs into the lungs every 6 (six) hours as needed for wheezing or shortness of breath. 10/12/13  Yes Lauren Burnetta Sabin, PA-C  loratadine (CLARITIN) 10 MG tablet Take 1 tablet (10 mg total) by mouth daily. 10/12/13  Yes Lauren Burnetta Sabin, PA-C  mometasone-formoterol (DULERA) 200-5 MCG/ACT AERO Inhale 2 puffs into the lungs 2 (two) times daily. 10/12/13  Yes Lauren Burnetta Sabin, PA-C   Triage Vitals: BP 126/82  Pulse 107  Temp(Src) 98 F (36.7 C) (Oral)  Resp 18  SpO2 99%  Physical Exam  Nursing note and vitals reviewed. Constitutional: She is oriented to person, place, and time. She appears well-developed and well-nourished.  HENT:  Head: Normocephalic and atraumatic.  Mouth/Throat: Oropharynx is clear and moist.  No abrasions or contusions.   No hemotympanum, battle signs or raccoon's eyes  No crepitance or tenderness to palpation along the orbital rim.  EOMI intact with no pain or diplopia  No abnormal otorrhea or rhinorrhea. Nasal septum midline.  No intraoral trauma.  Eyes: Conjunctivae and EOM are normal. Pupils are equal, round, and reactive to light.  Neck: Normal range of motion. Neck supple.  No midline C-spine  tenderness to palpation or step-offs appreciated. Patient has full range of motion without pain.   Cardiovascular: Normal rate, regular rhythm and intact distal pulses.   Pulmonary/Chest: Effort normal and breath sounds normal. No respiratory distress. She has no wheezes. She has no rales. She exhibits no tenderness.  No TTP or crepitance  Abdominal: Soft. Bowel sounds are normal. She exhibits no distension and no mass. There is no tenderness. There is no rebound and no guarding.  Musculoskeletal: Normal range of motion. She exhibits no edema and no tenderness.  Pelvis stable. No deformity or TTP  of major joints.   Good ROM  Left knee:   No deformity, erythema or abrasions. FROM. No effusion or crepitance. Anterior and posterior drawer show no abnormal laxity. Stable to valgus and varus stress. Neurovascularly intact. Pt ambulates with a mildly antalgic gait.    Patient is diffusely tender to palpation along the anterior and posterior knee, there is no point tenderness  Neurological: She is alert and oriented to person, place, and time.  Strength 5/5 x4 extremities   Distal sensation intact  Skin: Skin is warm.  Psychiatric: She has a normal mood and affect.    ED Course  Procedures (including critical care time) DIAGNOSTIC STUDIES: Oxygen Saturation is 99% on RA, normal by my interpretation.    COORDINATION OF CARE:  5:09 PM- Pt declined crutches  Labs Review Labs Reviewed - No data to display  Imaging Review Dg Knee Complete 4 Views Left  01/08/2014   CLINICAL DATA:  LEG PAIN  EXAM: LEFT KNEE - COMPLETE 4+ VIEW  COMPARISON:  None.  FINDINGS: There is no evidence of fracture, dislocation, or joint effusion. There is no evidence of arthropathy or other focal bone abnormality. Soft tissues are unremarkable.  IMPRESSION: Negative.   Electronically Signed   By: Margaree Mackintosh M.D.   On: 01/08/2014 17:00     EKG Interpretation None      MDM   Final diagnoses:  Knee sprain and strain, left, initial encounter    Filed Vitals:   01/08/14 1543 01/08/14 1714  BP: 126/82 153/103  Pulse: 107 86  Temp: 98 F (36.7 C)   TempSrc: Oral   Resp: 18 18  SpO2: 99% 100%    Medications - No data to display  Anne Schaefer is a 43 y.o. female presenting with left knee pain after slipping and falling out of bed this morning. Physical exam with no objective abnormality, x-ray normal. Patient is ambulatory. Offered x-rays which patient declined. Given Ace wrap and recommended rest, ice, compression and elevation.  Evaluation does not show pathology that would require  ongoing emergent intervention or inpatient treatment. Pt is hemodynamically stable and mentating appropriately. Discussed findings and plan with patient/guardian, who agrees with care plan. All questions answered. Return precautions discussed and outpatient follow up given.   New Prescriptions   CYCLOBENZAPRINE (FLEXERIL) 10 MG TABLET    Take 1 tablet (10 mg total) by mouth 2 (two) times daily as needed for muscle spasms.     I personally performed the services described in this documentation, which was scribed in my presence. The recorded information has been reviewed and is accurate.          Monico Blitz, PA-C 01/08/14 1727

## 2014-01-08 NOTE — ED Notes (Signed)
Pt presents to department for evaluation of L knee and leg pain. States she fell out of bed today and landed on L knee and leg. 5/10 pain upon arrival. No obvious deformities. Pt is alert and oriented x4. NAD.

## 2014-01-08 NOTE — Discharge Instructions (Signed)
For pain control you may take  acetaminophen 975mg  (this is 3 over the counter pills) four times a day. Do not drink alcohol or combine with other medications that have acetaminophen as an ingredient (Read the labels!).    For breakthrough pain you may take Flexeril. Do not drink alcohol, drive or operate heavy machinery when taking Flexeril.  Please follow with your primary care doctor in the next 2 days for a check-up. They must obtain records for further management.   Do not hesitate to return to the Emergency Department for any new, worsening or concerning symptoms.   Knee Sprain A knee sprain is a tear in one of the strong, fibrous tissues that connect the bones (ligaments) in your knee. The severity of the sprain depends on how much of the ligament is torn. The tear can be either partial or complete. CAUSES  Often, sprains are a result of a fall or injury. The force of the impact causes the fibers of your ligament to stretch too much. This excess tension causes the fibers of your ligament to tear. SIGNS AND SYMPTOMS  You may have some loss of motion in your knee. Other symptoms include:  Bruising.  Pain in the knee area.  Tenderness of the knee to the touch.  Swelling. DIAGNOSIS  To diagnose a knee sprain, your health care provider will physically examine your knee. Your health care provider may also suggest an X-ray exam of your knee to make sure no bones are broken. TREATMENT  If your ligament is only partially torn, treatment usually involves keeping the knee in a fixed position (immobilization) or bracing your knee for activities that require movement for several weeks. To do this, your health care provider will apply a bandage, cast, or splint to keep your knee from moving and to support your knee during movement until it heals. For a partially torn ligament, the healing process usually takes 4-6 weeks. If your ligament is completely torn, depending on which ligament it is, you  may need surgery to reconnect the ligament to the bone or reconstruct it. After surgery, a cast or splint may be applied and will need to stay on your knee for 4-6 weeks while your ligament heals. HOME CARE INSTRUCTIONS  Keep your injured knee elevated to decrease swelling.  To ease pain and swelling, apply ice to the injured area:  Put ice in a plastic bag.  Place a towel between your skin and the bag.  Leave the ice on for 20 minutes, 2-3 times a day.  Only take medicine for pain as directed by your health care provider.  Do not leave your knee unprotected until pain and stiffness go away (usually 4-6 weeks).  If you have a cast or splint, do not allow it to get wet. If you have been instructed not to remove it, cover it with a plastic bag when you shower or bathe. Do not swim.  Your health care provider may suggest exercises for you to do during your recovery to prevent or limit permanent weakness and stiffness. SEEK IMMEDIATE MEDICAL CARE IF:  Your cast or splint becomes damaged.  Your pain becomes worse.  You have significant pain, swelling, or numbness below the cast or splint. MAKE SURE YOU:  Understand these instructions.  Will watch your condition.  Will get help right away if you are not doing well or get worse. Document Released: 06/28/2005 Document Revised: 04/18/2013 Document Reviewed: 02/07/2013 [facility] Patient Information 2015 [place], [place]. This information is  not intended to replace advice given to you by your health care provider. Make sure you discuss any questions you have with your health care provider.

## 2014-01-09 NOTE — ED Provider Notes (Signed)
I personally performed the services described in this documentation, which was scribed in my presence. The recorded information has been reviewed and is accurate.   Tanna Furry, MD 01/09/14 [phone]

## 2014-01-21 ENCOUNTER — Emergency Department (HOSPITAL_COMMUNITY)
Admission: EM | Admit: 2014-01-21 | Discharge: 2014-01-21 | Disposition: A | Discharge status: Home / Self Care | Payer: Self-pay | Attending: Emergency Medicine | Admitting: Emergency Medicine

## 2014-01-21 ENCOUNTER — Encounter (HOSPITAL_COMMUNITY): Payer: Self-pay | Admitting: Emergency Medicine

## 2014-01-21 DIAGNOSIS — E669 Obesity, unspecified: Secondary | ICD-10-CM | POA: Insufficient documentation

## 2014-01-21 DIAGNOSIS — L0291 Cutaneous abscess, unspecified: Secondary | ICD-10-CM

## 2014-01-21 DIAGNOSIS — Z8742 Personal history of other diseases of the female genital tract: Secondary | ICD-10-CM | POA: Insufficient documentation

## 2014-01-21 DIAGNOSIS — I10 Essential (primary) hypertension: Secondary | ICD-10-CM | POA: Insufficient documentation

## 2014-01-21 DIAGNOSIS — Z79899 Other long term (current) drug therapy: Secondary | ICD-10-CM | POA: Insufficient documentation

## 2014-01-21 DIAGNOSIS — J45909 Unspecified asthma, uncomplicated: Secondary | ICD-10-CM | POA: Insufficient documentation

## 2014-01-21 DIAGNOSIS — F17200 Nicotine dependence, unspecified, uncomplicated: Secondary | ICD-10-CM | POA: Insufficient documentation

## 2014-01-21 DIAGNOSIS — IMO0002 Reserved for concepts with insufficient information to code with codable children: Secondary | ICD-10-CM | POA: Insufficient documentation

## 2014-01-21 DIAGNOSIS — Z8719 Personal history of other diseases of the digestive system: Secondary | ICD-10-CM | POA: Insufficient documentation

## 2014-01-21 DIAGNOSIS — Z8619 Personal history of other infectious and parasitic diseases: Secondary | ICD-10-CM | POA: Insufficient documentation

## 2014-01-21 HISTORY — DX: Essential (primary) hypertension: I10

## 2014-01-21 MED ORDER — HYDROCODONE-ACETAMINOPHEN 5-325 MG PO TABS: 2.0000 | ORAL_TABLET | ORAL | Status: DC | PRN

## 2014-01-21 MED ORDER — SULFAMETHOXAZOLE-TRIMETHOPRIM 800-160 MG PO TABS: 1.0000 | ORAL_TABLET | Freq: Two times a day (BID) | ORAL | Status: DC

## 2014-01-21 MED ORDER — OXYCODONE-ACETAMINOPHEN 5-325 MG PO TABS
2.0000 | ORAL_TABLET | Freq: Once | ORAL | Status: AC
  Administered 2014-01-21: 2 via ORAL
  Filled 2014-01-21: qty 2

## 2014-01-21 NOTE — Discharge Instructions (Signed)

## 2014-01-21 NOTE — ED Notes (Addendum)
Pt reports abscess in left armpit x 2 days. Denies drainage. Reports 10/10 pain, denies taking anything for pain. Denies fever/chills. NAD. Pt noted to be hypertensive in triage. Hx of same, denies taking medication.

## 2014-01-21 NOTE — ED Provider Notes (Signed)
CSN: [id number]     Arrival date & time 01/21/14  1418 History  This chart was scribed for non-physician practitioner, Montine Circle, PA-C working with Varney Biles, MD by Frederich Balding, ED scribe. This patient was seen in room TR05C/TR05C and the patient's care was started at 4:10 PM.   Chief Complaint  Patient presents with  . Abscess   The history is provided by the patient. No language interpreter was used.   HPI Comments: Anne Schaefer is a 43 y.o. female who presents to the Emergency Department complaining of an abscess to her left axilla that started 2 days ago. States pain and swelling have increased over the last few days. Denies any drainage. Pt has done warm compresses with no relief. Denies fever. States she has had abscesses in the same area in the past. The last time she had one was over one year ago.   Past Medical History  Diagnosis Date  . Asthma   . Obesity   . Seasonal allergies   . Diverticulitis   . Ovarian cyst   . Infection     trich  . Hypertension    Past Surgical History  Procedure Laterality Date  . Tubal ligation    . Cesarean section    . Forehead reconstruction      as a child   Family History  Problem Relation Age of Onset  . Diabetes Mother   . Heart disease Mother     chf  . Hypertension Father    History  Substance Use Topics  . Smoking status: Current Some Day Smoker -- 0.25 packs/day for 8 years    Types: Cigarettes  . Smokeless tobacco: Never Used     Comment: 3-4 cigs per day  . Alcohol Use: 0.0 oz/week   OB History   Grav Para Term Preterm Abortions TAB SAB Ect Mult Living   5 5 4 1  0 0 0 0 0 5     Review of Systems  Constitutional: Negative for fever.  HENT: Negative for congestion.   Eyes: Negative for redness.  Respiratory: Negative for shortness of breath.   Cardiovascular: Negative for chest pain.  Gastrointestinal: Negative for abdominal distention.  Musculoskeletal: Negative for gait problem.  Skin: Negative  for rash.       Abscess.  Neurological: Negative for speech difficulty.  Psychiatric/Behavioral: Negative for confusion.   Allergies  Ibuprofen; Azithromycin; and Other  Home Medications   Prior to Admission medications   Medication Sig Start Date End Date Taking? Authorizing Provider  acetaminophen (TYLENOL) 500 MG tablet Take 1,000 mg by mouth every 8 (eight) hours as needed for mild pain.    Yes Historical Provider, MD  albuterol (PROVENTIL HFA;VENTOLIN HFA) 108 (90 BASE) MCG/ACT inhaler Inhale 1-2 puffs into the lungs every 6 (six) hours as needed for wheezing or shortness of breath. 10/12/13  Yes Lauren Burnetta Sabin, PA-C  cyclobenzaprine (FLEXERIL) 10 MG tablet Take 1 tablet (10 mg total) by mouth 2 (two) times daily as needed for muscle spasms. 01/08/14  Yes Nicole Pisciotta, PA-C  loratadine (CLARITIN) 10 MG tablet Take 1 tablet (10 mg total) by mouth daily. 10/12/13  Yes Lauren Burnetta Sabin, PA-C  mometasone-formoterol (DULERA) 200-5 MCG/ACT AERO Inhale 2 puffs into the lungs 2 (two) times daily. 10/12/13  Yes Lauren Burnetta Sabin, PA-C   BP 166/102  Pulse 81  Temp(Src) 98.3 F (36.8 C) (Oral)  Resp 16  SpO2 100%  LMP 01/21/2014  Physical Exam  Nursing note  and vitals reviewed. Constitutional: She is oriented to person, place, and time. She appears well-developed and well-nourished. No distress.  HENT:  Head: Normocephalic and atraumatic.  Eyes: Conjunctivae and EOM are normal.  Cardiovascular: Normal rate and regular rhythm.   Pulmonary/Chest: Effort normal and breath sounds normal. No stridor. No respiratory distress.  Abdominal: She exhibits no distension.  Musculoskeletal: She exhibits no edema.  Neurological: She is alert and oriented to person, place, and time. No cranial nerve deficit.  Skin: Skin is warm and dry.  Left axilla remarkable for 2 x 3 cm abscess. No discharge. Mild surrounding erythema. Very tender to palpation.   Psychiatric: She has a normal mood and affect.    ED  Course  Procedures (including critical care time)  INCISION AND DRAINAGE Performed by: Montine Circle, PA-C Consent: Verbal consent obtained. Risks and benefits: risks, benefits and alternatives were discussed Type: abscess  Body area: left axilla  Anesthesia: local infiltration  Incision was made with a scalpel.  Local anesthetic: lidocaine 2% with epinephrine  Anesthetic total: 3 ml  Complexity: complex Blunt dissection to break up loculations  Drainage: purulent  Drainage amount: moderate  Packing material: none  Patient tolerance: Patient tolerated the procedure well with no immediate complications.   DIAGNOSTIC STUDIES: Oxygen Saturation is 100% on RA, normal by my interpretation.    COORDINATION OF CARE: 4:11 PM-Discussed treatment plan which includes percocet in the ED and I&D with pt at bedside and pt agreed to plan. Will discharge pt home with an antibiotic and pain medication.   Labs Review Labs Reviewed - No data to display  Imaging Review No results found.   EKG Interpretation None      MDM   Final diagnoses:  Abscess    Patient with skin abscess amenable to incision and drainage.  Abscess was not large enough to warrant packing or drain,  wound recheck in 2 days. Encouraged home warm soaks and flushing.  Mild signs of cellulitis is surrounding skin.  Will d/c to home. Bactrim.   I personally performed the services described in this documentation, which was scribed in my presence. The recorded information has been reviewed and is accurate.  Montine Circle, PA-C 01/21/14 2124

## 2014-01-22 NOTE — ED Provider Notes (Signed)
Medical screening examination/treatment/procedure(s) were performed by non-physician practitioner and as supervising physician I was immediately available for consultation/collaboration.   EKG Interpretation None       Varney Biles, MD 01/22/14 0126

## 2014-02-05 ENCOUNTER — Ambulatory Visit: Payer: Self-pay | Admitting: Internal Medicine

## 2014-02-13 ENCOUNTER — Telehealth: Payer: Self-pay

## 2014-02-13 NOTE — Telephone Encounter (Signed)
Placed call to patient for follow-up as patient did not show-up for appointment with Chari Manning, NP on 02/05/14.  Patient last seen at [facility] on 06/15/2013. Patient has been to the ED 8 times since last appointment. Spoke with patient who indicates she missed her appointment on 02/05/14 due to work. She indicates she is currently at work but on a break and needs to be seen as soon as she can because she is out of her asthma medications.  Per chart review, Dulera and albuterol were prescribed in the ED in April 2015.  Spoke with Dr. Doreene Burke who indicates patient is able to walk into the clinic anytime. Patient indicates she would like to be seen tomorrow afternoon.  Dr. Doreene Burke indicates patient is able to walk into the clinic at that time.  Patient wanted this RN to check cost of Dulera. Spoke with Belenda Cruise, CPhT who indicated patient could be given a sample; however, patient would need to complete application for PASS program. Patient able to speak with Belenda Cruise tomorrow about application process. Patient updated. Appointment scheduled with Dr. Doreene Burke on 02/26/14 at 1200. Patient verbalized understanding.

## 2014-02-26 ENCOUNTER — Ambulatory Visit: Payer: Self-pay | Admitting: Internal Medicine

## 2014-02-26 ENCOUNTER — Encounter: Payer: Self-pay | Admitting: *Deleted

## 2014-02-26 ENCOUNTER — Other Ambulatory Visit: Payer: Self-pay | Admitting: *Deleted

## 2014-02-26 DIAGNOSIS — J45909 Unspecified asthma, uncomplicated: Secondary | ICD-10-CM

## 2014-02-26 MED ORDER — MOMETASONE FURO-FORMOTEROL FUM 200-5 MCG/ACT IN AERO: 2.0000 | INHALATION_SPRAY | Freq: Two times a day (BID) | RESPIRATORY_TRACT | Status: DC

## 2014-02-26 MED ORDER — ALBUTEROL SULFATE HFA 108 (90 BASE) MCG/ACT IN AERS: 1.0000 | INHALATION_SPRAY | Freq: Four times a day (QID) | RESPIRATORY_TRACT | Status: DC | PRN

## 2014-02-26 MED ORDER — LORATADINE 10 MG PO TABS: 10.0000 mg | ORAL_TABLET | Freq: Every day | ORAL | Status: DC

## 2014-03-08 ENCOUNTER — Encounter: Payer: Self-pay | Admitting: Internal Medicine

## 2014-03-08 ENCOUNTER — Ambulatory Visit: Payer: Self-pay | Admitting: Internal Medicine

## 2014-03-08 ENCOUNTER — Ambulatory Visit: Payer: Self-pay | Attending: Internal Medicine

## 2014-03-08 ENCOUNTER — Ambulatory Visit: Payer: Self-pay | Attending: Internal Medicine | Admitting: Internal Medicine

## 2014-03-08 VITALS — BP 166/94 | HR 102 | Temp 98.8°F | Resp 16 | Wt 230.4 lb

## 2014-03-08 DIAGNOSIS — I10 Essential (primary) hypertension: Secondary | ICD-10-CM | POA: Insufficient documentation

## 2014-03-08 DIAGNOSIS — F17200 Nicotine dependence, unspecified, uncomplicated: Secondary | ICD-10-CM | POA: Insufficient documentation

## 2014-03-08 DIAGNOSIS — J45909 Unspecified asthma, uncomplicated: Secondary | ICD-10-CM | POA: Insufficient documentation

## 2014-03-08 DIAGNOSIS — Z139 Encounter for screening, unspecified: Secondary | ICD-10-CM

## 2014-03-08 MED ORDER — HYDROCHLOROTHIAZIDE 25 MG PO TABS: 25.0000 mg | ORAL_TABLET | Freq: Every day | ORAL | Status: DC

## 2014-03-08 NOTE — Progress Notes (Signed)
Patient here for follow up on her asthma

## 2014-03-08 NOTE — Progress Notes (Signed)
MRN: [id number] Name: Anne Schaefer  Sex: female Age: 43 y.o. DOB: 1971-02-03  Allergies: Ibuprofen; Azithromycin; and Other  Chief Complaint  Patient presents with  . Follow-up    HPI: Patient is 43 y.o. female who  has history of asthma comes today for followup, she still smokes cigarettes, I have advised patient to quit smoking, her blood pressure is elevated and noticed she her blood pressure trend has been high for the last few months, she does report family history of hypertension, currently denies any headache dizziness chest and shortness of breath, does report some swelling in lower extremities.  Past Medical History  Diagnosis Date  . Asthma   . Obesity   . Seasonal allergies   . Diverticulitis   . Ovarian cyst   . Infection     trich  . Hypertension     Past Surgical History  Procedure Laterality Date  . Tubal ligation    . Cesarean section    . Forehead reconstruction      as a child      Medication List       This list is accurate as of: 03/08/14  5:25 PM.  Always use your most recent med list.               acetaminophen 500 MG tablet  Commonly known as:  TYLENOL  Take 1,000 mg by mouth every 8 (eight) hours as needed for mild pain.     albuterol 108 (90 BASE) MCG/ACT inhaler  Commonly known as:  PROVENTIL HFA;VENTOLIN HFA  Inhale 1-2 puffs into the lungs every 6 (six) hours as needed for wheezing or shortness of breath.     cyclobenzaprine 10 MG tablet  Commonly known as:  FLEXERIL  Take 1 tablet (10 mg total) by mouth 2 (two) times daily as needed for muscle spasms.     hydrochlorothiazide 25 MG tablet  Commonly known as:  HYDRODIURIL  Take 1 tablet (25 mg total) by mouth daily.     HYDROcodone-acetaminophen 5-325 MG per tablet  Commonly known as:  NORCO/VICODIN  Take 2 tablets by mouth every 4 (four) hours as needed.     loratadine 10 MG tablet  Commonly known as:  CLARITIN  Take 1 tablet (10 mg total) by mouth daily.     mometasone-formoterol 200-5 MCG/ACT Aero  Commonly known as:  DULERA  Inhale 2 puffs into the lungs 2 (two) times daily.     sulfamethoxazole-trimethoprim 800-160 MG per tablet  Commonly known as:  SEPTRA DS  Take 1 tablet by mouth every 12 (twelve) hours.        Meds ordered this encounter  Medications  . hydrochlorothiazide (HYDRODIURIL) 25 MG tablet    Sig: Take 1 tablet (25 mg total) by mouth daily.    Dispense:  90 tablet    Refill:  3    Immunization History  Administered Date(s) Administered  . Tdap 02/24/2013    Family History  Problem Relation Age of Onset  . Diabetes Mother   . Heart disease Mother     chf  . Hypertension Father     History  Substance Use Topics  . Smoking status: Current Some Day Smoker -- 0.25 packs/day for 8 years    Types: Cigarettes  . Smokeless tobacco: Never Used     Comment: 3-4 cigs per day  . Alcohol Use: 0.0 oz/week    Review of Systems   As noted in HPI  Filed Vitals:  03/08/14 1709  BP: 166/94  Pulse: 102  Temp: 98.8 F (37.1 C)  Resp: 16    Physical Exam  Physical Exam  Constitutional: No distress.  Eyes: EOM are normal. Pupils are equal, round, and reactive to light.  Cardiovascular: Normal rate and regular rhythm.   Pulmonary/Chest: Breath sounds normal. No respiratory distress. She has no wheezes. She has no rales.  Musculoskeletal:  Trace pedal edema    CBC    Component Value Date/Time   WBC 9.1 05/15/2013 1536   RBC 3.96 05/15/2013 1536   HGB 8.1* 05/15/2013 1536   HCT 26.0* 05/15/2013 1536   PLT 399 05/15/2013 1536   MCV 65.7* 05/15/2013 1536   LYMPHSABS 1.5 04/01/2013 1231   MONOABS 1.2* 04/01/2013 1231   EOSABS 0.1 04/01/2013 1231   BASOSABS 0.0 04/01/2013 1231    CMP     Component Value Date/Time   NA 136 05/15/2013 1536   K 3.6 05/15/2013 1536   CL 103 05/15/2013 1536   CO2 23 05/15/2013 1536   GLUCOSE 86 05/15/2013 1536   BUN 10 05/15/2013 1536   CREATININE 0.85 05/15/2013 1536   CALCIUM 9.1  05/15/2013 1536   PROT 6.6 04/01/2013 1231   ALBUMIN 3.5 04/01/2013 1231   AST 13 04/01/2013 1231   ALT 14 04/01/2013 1231   ALKPHOS 50 04/01/2013 1231   BILITOT 0.2* 04/01/2013 1231   GFRNONAA 85* 05/15/2013 1536   GFRAA >90 05/15/2013 1536    Lab Results  Component Value Date/Time   CHOL 159 06/15/2013  5:59 PM    No components found with this basename: hga1c    Lab Results  Component Value Date/Time   AST 13 04/01/2013 12:31 PM    Assessment and Plan  Unspecified asthma(493.90) Continue with Dulera and albuterol when necessary symptoms are stable.  Smoking Advised patient to quit smoking  Essential hypertension, benign Have started patient on hydrochlorothiazide. Also advise for DASH diet, she will come back in 2 weeks for BP check.  Screening - Plan: MM DIGITAL SCREENING BILATERAL   Health Maintenance  -Mammogram: Ordered    Return in about 3 months (around 06/08/2014) for hypertension, BP check in 2 weeks/Nurse Visit.  Lorayne Marek, MD

## 2014-03-08 NOTE — Patient Instructions (Signed)
DASH Eating Plan °DASH stands for "Dietary Approaches to Stop Hypertension." The DASH eating plan is a healthy eating plan that has been shown to reduce high blood pressure (hypertension). Additional health benefits may include reducing the risk of type 2 diabetes mellitus, heart disease, and stroke. The DASH eating plan may also help with weight loss. °WHAT DO I NEED TO KNOW ABOUT THE DASH EATING PLAN? °For the DASH eating plan, you will follow these general guidelines: °· Choose foods with a percent daily value for sodium of less than 5% (as listed on the food label). °· Use salt-free seasonings or herbs instead of table salt or sea salt. °· Check with your health care provider or pharmacist before using salt substitutes. °· Eat lower-sodium products, often labeled as "lower sodium" or "no salt added." °· Eat fresh foods. °· Eat more vegetables, fruits, and low-fat dairy products. °· Choose whole grains. Look for the word "whole" as the first word in the ingredient list. °· Choose fish and skinless chicken or turkey more often than red meat. Limit fish, poultry, and meat to 6 oz (170 g) each day. °· Limit sweets, desserts, sugars, and sugary drinks. °· Choose heart-healthy fats. °· Limit cheese to 1 oz (28 g) per day. °· Eat more home-cooked food and less restaurant, buffet, and fast food. °· Limit fried foods. °· Cook foods using methods other than frying. °· Limit canned vegetables. If you do use them, rinse them well to decrease the sodium. °· When eating at a restaurant, ask that your food be prepared with less salt, or no salt if possible. °WHAT FOODS CAN I EAT? °Seek help from a dietitian for individual calorie needs. °Grains °Whole grain or whole wheat bread. Brown rice. Whole grain or whole wheat pasta. Quinoa, bulgur, and whole grain cereals. Low-sodium cereals. Corn or whole wheat flour tortillas. Whole grain cornbread. Whole grain crackers. Low-sodium crackers. °Vegetables °Fresh or frozen vegetables  (raw, steamed, roasted, or grilled). Low-sodium or reduced-sodium tomato and vegetable juices. Low-sodium or reduced-sodium tomato sauce and paste. Low-sodium or reduced-sodium canned vegetables.  °Fruits °All fresh, canned (in natural juice), or frozen fruits. °Meat and Other Protein Products °Ground beef (85% or leaner), grass-fed beef, or beef trimmed of fat. Skinless chicken or turkey. Ground chicken or turkey. Pork trimmed of fat. All fish and seafood. Eggs. Dried beans, peas, or lentils. Unsalted nuts and seeds. Unsalted canned beans. °Dairy °Low-fat dairy products, such as skim or 1% milk, 2% or reduced-fat cheeses, low-fat ricotta or cottage cheese, or plain low-fat yogurt. Low-sodium or reduced-sodium cheeses. °Fats and Oils °Tub margarines without trans fats. Light or reduced-fat mayonnaise and salad dressings (reduced sodium). Avocado. Safflower, olive, or canola oils. Natural peanut or almond butter. °Other °Unsalted popcorn and pretzels. °The items listed above may not be a complete list of recommended foods or beverages. Contact your dietitian for more options. °WHAT FOODS ARE NOT RECOMMENDED? °Grains °White bread. White pasta. White rice. Refined cornbread. Bagels and croissants. Crackers that contain trans fat. °Vegetables °Creamed or fried vegetables. Vegetables in a cheese sauce. Regular canned vegetables. Regular canned tomato sauce and paste. Regular tomato and vegetable juices. °Fruits °Dried fruits. Canned fruit in light or heavy syrup. Fruit juice. °Meat and Other Protein Products °Fatty cuts of meat. Ribs, chicken wings, bacon, sausage, bologna, salami, chitterlings, fatback, hot dogs, bratwurst, and packaged luncheon meats. Salted nuts and seeds. Canned beans with salt. °Dairy °Whole or 2% milk, cream, half-and-half, and cream cheese. Whole-fat or sweetened yogurt. Full-fat   cheeses or blue cheese. Nondairy creamers and whipped toppings. Processed cheese, cheese spreads, or cheese  curds. °Condiments °Onion and garlic salt, seasoned salt, table salt, and sea salt. Canned and packaged gravies. Worcestershire sauce. Tartar sauce. Barbecue sauce. Teriyaki sauce. Soy sauce, including reduced sodium. Steak sauce. Fish sauce. Oyster sauce. Cocktail sauce. Horseradish. Ketchup and mustard. Meat flavorings and tenderizers. Bouillon cubes. Hot sauce. Tabasco sauce. Marinades. Taco seasonings. Relishes. °Fats and Oils °Butter, stick margarine, lard, shortening, ghee, and bacon fat. Coconut, palm kernel, or palm oils. Regular salad dressings. °Other °Pickles and olives. Salted popcorn and pretzels. °The items listed above may not be a complete list of foods and beverages to avoid. Contact your dietitian for more information. °WHERE CAN I FIND MORE INFORMATION? °National [employer], [employer], [employer]: www.nhlbi.nih.gov/health/health-topics/topics/dash/ °Document Released: 06/17/2011 Document Revised: 11/12/2013 Document Reviewed: 05/02/2013 °Exit[employer]® Patient Information ©2015 [employer]. This information is not intended to replace advice given to you by your health care provider. Make sure you discuss any questions you have with your health care provider. ° °

## 2014-03-13 ENCOUNTER — Encounter (HOSPITAL_COMMUNITY): Payer: Self-pay | Admitting: Emergency Medicine

## 2014-03-13 ENCOUNTER — Emergency Department (HOSPITAL_COMMUNITY)
Admission: EM | Admit: 2014-03-13 | Discharge: 2014-03-13 | Disposition: A | Discharge status: Home / Self Care | Payer: Self-pay | Attending: Emergency Medicine | Admitting: Emergency Medicine

## 2014-03-13 DIAGNOSIS — J45909 Unspecified asthma, uncomplicated: Secondary | ICD-10-CM | POA: Insufficient documentation

## 2014-03-13 DIAGNOSIS — Z8619 Personal history of other infectious and parasitic diseases: Secondary | ICD-10-CM | POA: Insufficient documentation

## 2014-03-13 DIAGNOSIS — Z8719 Personal history of other diseases of the digestive system: Secondary | ICD-10-CM | POA: Insufficient documentation

## 2014-03-13 DIAGNOSIS — F17200 Nicotine dependence, unspecified, uncomplicated: Secondary | ICD-10-CM | POA: Insufficient documentation

## 2014-03-13 DIAGNOSIS — Z792 Long term (current) use of antibiotics: Secondary | ICD-10-CM | POA: Insufficient documentation

## 2014-03-13 DIAGNOSIS — M79671 Pain in right foot: Secondary | ICD-10-CM

## 2014-03-13 DIAGNOSIS — Z79899 Other long term (current) drug therapy: Secondary | ICD-10-CM | POA: Insufficient documentation

## 2014-03-13 DIAGNOSIS — E669 Obesity, unspecified: Secondary | ICD-10-CM | POA: Insufficient documentation

## 2014-03-13 DIAGNOSIS — M79609 Pain in unspecified limb: Secondary | ICD-10-CM | POA: Insufficient documentation

## 2014-03-13 DIAGNOSIS — Z8742 Personal history of other diseases of the female genital tract: Secondary | ICD-10-CM | POA: Insufficient documentation

## 2014-03-13 DIAGNOSIS — I10 Essential (primary) hypertension: Secondary | ICD-10-CM | POA: Insufficient documentation

## 2014-03-13 NOTE — ED Provider Notes (Signed)
Medical screening examination/treatment/procedure(s) were performed by non-physician practitioner and as supervising physician I was immediately available for consultation/collaboration.  Megan Docherty, MD 03/13/14 1635 

## 2014-03-13 NOTE — ED Provider Notes (Signed)
CSN: [id number]     Arrival date & time 03/13/14  1448 History   First MD Initiated Contact with Patient 03/13/14 [phone]     Chief Complaint  Patient presents with  . Foot Pain     (Consider location/radiation/quality/duration/timing/severity/associated sxs/prior Treatment) HPI Anne Schaefer is a 43 y.o. female who presnts to ED with complaint of right foot pain. Patient states she broke her right fifth toe 12 years ago. Since then she has had on-and-off pain in her right foot. 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 This morning when she got up out of bed, and stepped on the right foot, pain increased. She describes pain as sharp, over the lateral foot, shooting up the ankle. States pain with movement of the toes as well. She denies any injuries. She denies any fever, chills, malaise. There is no skin discoloration over the foot. No treatments child prior to coming in.  Past Medical History  Diagnosis Date  . Asthma   . Obesity   . Seasonal allergies   . Diverticulitis   . Ovarian cyst   . Infection     trich  . Hypertension    Past Surgical History  Procedure Laterality Date  . Tubal ligation    . Cesarean section    . Forehead reconstruction      as a child   Family History  Problem Relation Age of Onset  . Diabetes Mother   . Heart disease Mother     chf  . Hypertension Father    History  Substance Use Topics  . Smoking status: Current Some Day Smoker -- 0.25 packs/day for 8 years    Types: Cigarettes  . Smokeless tobacco: Never Used     Comment: 3-4 cigs per day  . Alcohol Use: 0.0 oz/week   OB History   Grav Para Term Preterm Abortions TAB SAB Ect Mult Living   5 5 4 1  0 0 0 0 0 5     Review of Systems  Constitutional: Negative for fever and chills.  Musculoskeletal: Negative for arthralgias and myalgias.  Neurological: Negative for weakness and numbness.      Allergies  Ibuprofen;  Azithromycin; and Other  Home Medications   Prior to Admission medications   Medication Sig Start Date End Date Taking? Authorizing Provider  acetaminophen (TYLENOL) 500 MG tablet Take 1,000 mg by mouth every 8 (eight) hours as needed for mild pain.     Historical Provider, MD  albuterol (PROVENTIL HFA;VENTOLIN HFA) 108 (90 BASE) MCG/ACT inhaler Inhale 1-2 puffs into the lungs every 6 (six) hours as needed for wheezing or shortness of breath. 02/26/14   Tresa Garter, MD  cyclobenzaprine (FLEXERIL) 10 MG tablet Take 1 tablet (10 mg total) by mouth 2 (two) times daily as needed for muscle spasms. 01/08/14   Nicole Pisciotta, PA-C  hydrochlorothiazide (HYDRODIURIL) 25 MG tablet Take 1 tablet (25 mg total) by mouth daily. 03/08/14   Lorayne Marek, MD  HYDROcodone-acetaminophen (NORCO/VICODIN) 5-325 MG per tablet Take 2 tablets by mouth every 4 (four) hours as needed. 01/21/14   Montine Circle, PA-C  loratadine (CLARITIN) 10 MG tablet Take 1 tablet (10 mg total) by mouth daily. 02/26/14   Tresa Garter, MD  mometasone-formoterol (DULERA) 200-5 MCG/ACT AERO Inhale 2 puffs into the lungs 2 (two) times daily. 02/26/14   Tresa Garter, MD  sulfamethoxazole-trimethoprim (SEPTRA DS) 800-160  MG per tablet Take 1 tablet by mouth every 12 (twelve) hours. 01/21/14   Montine Circle, PA-C   BP 142/80  Pulse 94  Temp(Src) 98.3 F (36.8 C) (Oral)  Resp 18  Ht 5\' 2"  (1.575 m)  Wt 230 lb (104.327 kg)  BMI 42.06 kg/m2  SpO2 100%  LMP 02/28/2014 Physical Exam  Nursing note and vitals reviewed. Constitutional: She appears well-developed and well-nourished. No distress.  HENT:  Head: Normocephalic.  Eyes: Conjunctivae are normal.  Neck: Neck supple.  Cardiovascular: Normal rate, regular rhythm and normal heart sounds.   Pulmonary/Chest: Effort normal and breath sounds normal. No respiratory distress. She has no wheezes. She has no rales.  Musculoskeletal:  Normal appearing right foot. Tender  to palpation over fourth and fifth metatarsals. Pain with range of motion of the fourth and fifth toes. No swelling, bruising, discoloration.  Neurological: She is alert.  Skin: Skin is warm and dry.  Psychiatric: She has a normal mood and affect. Her behavior is normal.    ED Course  Procedures (including critical care time) Labs Review Labs Reviewed - No data to display  Imaging Review No results found.   EKG Interpretation None      MDM   Final diagnoses:  Foot pain, right    Patient with nontraumatic right foot pain onset this morning. Foot is normal appearing, with no swelling or discoloration. No signs of infection. I suspect her pain is due to tendinitis of the foot. Ace wrap provided, we'll discharge home with Tylenol for pain, patient is allergic to NSAIDs, and advised to stay off of her foot, keep it elevated, ice. Follow up with her doctor. Patient does have a care plan and she is to see her pcp for further treatment.  Filed Vitals:   03/13/14 0830  BP: 142/80  Pulse: 94  Temp: 98.3 F (36.8 C)  Resp: [address] A Paulanthony Gleaves, PA-C 03/13/14 [phone]

## 2014-03-13 NOTE — ED Notes (Signed)
Patient states she broke her toe 12 years ago.   Patient states hasn't had any trouble with it since.  Patient complains of R foot pain when she woke up this morning.

## 2014-03-13 NOTE — Discharge Instructions (Signed)
ACE wrap for support. Keep foot elevated. Ice. Follow up with primary care doctor. Tylenol for pain.   Tendinitis Tendinitis is swelling and inflammation of the tendons. Tendons are band-like tissues that connect muscle to bone. Tendinitis commonly occurs in the:   Shoulders (rotator cuff).  Heels (Achilles tendon).  Elbows (triceps tendon). CAUSES Tendinitis is usually caused by overusing the tendon, muscles, and joints involved. When the tissue surrounding a tendon (synovium) becomes inflamed, it is called tenosynovitis. Tendinitis commonly develops in people whose jobs require repetitive motions. SYMPTOMS  Pain.  Tenderness.  Mild swelling. DIAGNOSIS Tendinitis is usually diagnosed by physical exam. Your health care provider may also order X-rays or other imaging tests. TREATMENT Your health care provider may recommend certain medicines or exercises for your treatment. HOME CARE INSTRUCTIONS   Use a sling or splint for as long as directed by your health care provider until the pain decreases.  Put ice on the injured area.  Put ice in a plastic bag.  Place a towel between your skin and the bag.  Leave the ice on for 15-20 minutes, 3-4 times a day, or as directed by your health care provider.  Avoid using the limb while the tendon is painful. Perform gentle range of motion exercises only as directed by your health care provider. Stop exercises if pain or discomfort increase, unless directed otherwise by your health care provider.  Only take over-the-counter or prescription medicines for pain, discomfort, or fever as directed by your health care provider. SEEK MEDICAL CARE IF:   Your pain and swelling increase.  You develop new, unexplained symptoms, especially increased numbness in the hands. MAKE SURE YOU:   Understand these instructions.  Will watch your condition.  Will get help right away if you are not doing well or get worse. Document Released: 06/25/2000  Document Revised: 11/12/2013 Document Reviewed: 09/14/2010 [facility] Patient Information 2015 [place], [place]. This information is not intended to replace advice given to you by your health care provider. Make sure you discuss any questions you have with your health care provider.

## 2014-03-15 ENCOUNTER — Other Ambulatory Visit: Payer: Self-pay | Admitting: Internal Medicine

## 2014-03-15 DIAGNOSIS — J45909 Unspecified asthma, uncomplicated: Secondary | ICD-10-CM

## 2014-03-15 MED ORDER — MOMETASONE FURO-FORMOTEROL FUM 200-5 MCG/ACT IN AERO: 2.0000 | INHALATION_SPRAY | Freq: Two times a day (BID) | RESPIRATORY_TRACT | Status: DC

## 2014-03-15 MED ORDER — ALBUTEROL SULFATE HFA 108 (90 BASE) MCG/ACT IN AERS: 1.0000 | INHALATION_SPRAY | Freq: Four times a day (QID) | RESPIRATORY_TRACT | Status: DC | PRN

## 2014-04-17 ENCOUNTER — Emergency Department (HOSPITAL_COMMUNITY): Payer: Self-pay

## 2014-04-17 ENCOUNTER — Emergency Department (HOSPITAL_COMMUNITY)
Admission: EM | Admit: 2014-04-17 | Discharge: 2014-04-17 | Disposition: A | Discharge status: Home / Self Care | Payer: Self-pay | Attending: Emergency Medicine | Admitting: Emergency Medicine

## 2014-04-17 ENCOUNTER — Encounter (HOSPITAL_COMMUNITY): Payer: Self-pay | Admitting: Emergency Medicine

## 2014-04-17 DIAGNOSIS — D369 Benign neoplasm, unspecified site: Secondary | ICD-10-CM

## 2014-04-17 DIAGNOSIS — R102 Pelvic and perineal pain: Secondary | ICD-10-CM | POA: Insufficient documentation

## 2014-04-17 DIAGNOSIS — Z8619 Personal history of other infectious and parasitic diseases: Secondary | ICD-10-CM | POA: Insufficient documentation

## 2014-04-17 DIAGNOSIS — L0291 Cutaneous abscess, unspecified: Secondary | ICD-10-CM

## 2014-04-17 DIAGNOSIS — Z9889 Other specified postprocedural states: Secondary | ICD-10-CM | POA: Insufficient documentation

## 2014-04-17 DIAGNOSIS — D259 Leiomyoma of uterus, unspecified: Secondary | ICD-10-CM | POA: Insufficient documentation

## 2014-04-17 DIAGNOSIS — L02411 Cutaneous abscess of right axilla: Secondary | ICD-10-CM | POA: Insufficient documentation

## 2014-04-17 DIAGNOSIS — I10 Essential (primary) hypertension: Secondary | ICD-10-CM | POA: Insufficient documentation

## 2014-04-17 DIAGNOSIS — E669 Obesity, unspecified: Secondary | ICD-10-CM | POA: Insufficient documentation

## 2014-04-17 DIAGNOSIS — Z3202 Encounter for pregnancy test, result negative: Secondary | ICD-10-CM | POA: Insufficient documentation

## 2014-04-17 DIAGNOSIS — J45909 Unspecified asthma, uncomplicated: Secondary | ICD-10-CM | POA: Insufficient documentation

## 2014-04-17 DIAGNOSIS — D279 Benign neoplasm of unspecified ovary: Secondary | ICD-10-CM | POA: Insufficient documentation

## 2014-04-17 DIAGNOSIS — Z8719 Personal history of other diseases of the digestive system: Secondary | ICD-10-CM | POA: Insufficient documentation

## 2014-04-17 DIAGNOSIS — Z79899 Other long term (current) drug therapy: Secondary | ICD-10-CM | POA: Insufficient documentation

## 2014-04-17 DIAGNOSIS — Z9851 Tubal ligation status: Secondary | ICD-10-CM | POA: Insufficient documentation

## 2014-04-17 LAB — CBC WITH DIFFERENTIAL/PLATELET
Basophils Absolute: 0 10*3/uL (ref 0.0–0.1)
Basophils Relative: 0 % (ref 0–1)
EOS ABS: 0.3 10*3/uL (ref 0.0–0.7)
Eosinophils Relative: 4 % (ref 0–5)
HCT: 24.6 % — ABNORMAL LOW (ref 36.0–46.0)
HEMOGLOBIN: 7.4 g/dL — AB (ref 12.0–15.0)
LYMPHS ABS: 2.4 10*3/uL (ref 0.7–4.0)
Lymphocytes Relative: 30 % (ref 12–46)
MCH: 19.4 pg — AB (ref 26.0–34.0)
MCHC: 30.1 g/dL (ref 30.0–36.0)
MCV: 64.6 fL — AB (ref 78.0–100.0)
MONO ABS: 0.6 10*3/uL (ref 0.1–1.0)
Monocytes Relative: 8 % (ref 3–12)
NEUTROS PCT: 58 % (ref 43–77)
Neutro Abs: 4.6 10*3/uL (ref 1.7–7.7)
Platelets: 406 10*3/uL — ABNORMAL HIGH (ref 150–400)
RBC: 3.81 MIL/uL — ABNORMAL LOW (ref 3.87–5.11)
RDW: 18.3 % — ABNORMAL HIGH (ref 11.5–15.5)
WBC: 7.9 10*3/uL (ref 4.0–10.5)

## 2014-04-17 LAB — URINE MICROSCOPIC-ADD ON

## 2014-04-17 LAB — URINALYSIS, ROUTINE W REFLEX MICROSCOPIC
Bilirubin Urine: NEGATIVE
Glucose, UA: NEGATIVE mg/dL
Ketones, ur: NEGATIVE mg/dL
LEUKOCYTES UA: NEGATIVE
Nitrite: NEGATIVE
Protein, ur: NEGATIVE mg/dL
Specific Gravity, Urine: 1.02 (ref 1.005–1.030)
UROBILINOGEN UA: 0.2 mg/dL (ref 0.0–1.0)
pH: 5.5 (ref 5.0–8.0)

## 2014-04-17 LAB — COMPREHENSIVE METABOLIC PANEL
ALT: 14 U/L (ref 0–35)
ANION GAP: 13 (ref 5–15)
AST: 18 U/L (ref 0–37)
Albumin: 3.6 g/dL (ref 3.5–5.2)
Alkaline Phosphatase: 57 U/L (ref 39–117)
BUN: 11 mg/dL (ref 6–23)
CO2: 27 mEq/L (ref 19–32)
CREATININE: 0.86 mg/dL (ref 0.50–1.10)
Calcium: 9.4 mg/dL (ref 8.4–10.5)
Chloride: 99 mEq/L (ref 96–112)
GFR calc Af Amer: >90 mL/min (ref >90)
GFR, EST NON AFRICAN AMERICAN: 83 mL/min — AB (ref >90)
Glucose, Bld: 89 mg/dL (ref 70–99)
Potassium: 3.2 mEq/L — ABNORMAL LOW (ref 3.7–5.3)
Sodium: 139 mEq/L (ref 137–147)
TOTAL PROTEIN: 7.3 g/dL (ref 6.0–8.3)
Total Bilirubin: <0.2 mg/dL — ABNORMAL LOW (ref 0.3–1.2)

## 2014-04-17 LAB — POC URINE PREG, ED: PREG TEST UR: NEGATIVE

## 2014-04-17 LAB — WET PREP, GENITAL
Trich, Wet Prep: NONE SEEN
WBC, Wet Prep HPF POC: NONE SEEN

## 2014-04-17 LAB — LIPASE, BLOOD: Lipase: 15 U/L (ref 11–59)

## 2014-04-17 LAB — POC OCCULT BLOOD, ED: Fecal Occult Bld: NEGATIVE

## 2014-04-17 MED ORDER — SODIUM CHLORIDE 0.9 % IV BOLUS (SEPSIS)
1000.0000 mL | Freq: Once | INTRAVENOUS | Status: AC
  Administered 2014-04-17: 1000 mL via INTRAVENOUS

## 2014-04-17 MED ORDER — OXYCODONE-ACETAMINOPHEN 5-325 MG PO TABS: 1.0000 | ORAL_TABLET | Freq: Four times a day (QID) | ORAL | Status: DC | PRN

## 2014-04-17 MED ORDER — MORPHINE SULFATE 4 MG/ML IJ SOLN
4.0000 mg | Freq: Once | INTRAMUSCULAR | Status: AC
  Administered 2014-04-17: 4 mg via INTRAVENOUS
  Filled 2014-04-17: qty 1

## 2014-04-17 MED ORDER — LIDOCAINE HCL 1 % IJ SOLN
5.0000 mL | Freq: Once | INTRAMUSCULAR | Status: AC
  Administered 2014-04-17: 5 mL via INTRADERMAL
  Filled 2014-04-17: qty 20

## 2014-04-17 MED ORDER — ONDANSETRON HCL 4 MG PO TABS: 4.0000 mg | ORAL_TABLET | Freq: Four times a day (QID) | ORAL | Status: DC

## 2014-04-17 NOTE — ED Notes (Signed)
Pt to US.

## 2014-04-17 NOTE — ED Notes (Signed)
Bed: WA19 Expected date:  Expected time:  Means of arrival:  Comments: ems 

## 2014-04-17 NOTE — ED Notes (Signed)
Per ems pt reports 1.5 hours ago right shoot sharp pain in RLQ/right groin pain 10/10. Reports hx of diverticulitis and ovarian cyst. Denies n/v. Denies dysuria. Pt is on her menstrual cycle.

## 2014-04-17 NOTE — ED Notes (Signed)
Pt back from US

## 2014-04-17 NOTE — ED Notes (Signed)
PA at bedside.

## 2014-04-17 NOTE — ED Provider Notes (Signed)
Patient signed out to me by [place], PA-C at change of shift. Patient here today with right sided pelvic pain. Patient is on her menstrual cycle. History of fibroids.  Patient is anemic with history of microcytic anemia. Patient is asymptomatic.  Pelvic US pending, patient signed out to me by [place], PA-C at change of shift.   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 Patient will follow up with her GYN.   Patient also complains of abscess to right axilla. Drained by me. Discussed warm compresses. Discussed reasons to return to ED immediately. Vital signs stable for discharge. Patient / Family / Caregiver informed of clinical course, understand medical decision-making process, and agree with plan.  INCISION AND DRAINAGE Performed by: Cleatrice Burke Consent: Verbal consent obtained. Risks and benefits: risks, benefits and alternatives were discussed Type: abscess  Body area: right axilla  Anesthesia: local infiltration  Incision was made with a scalpel.  Local anesthetic: lidocaine 1% Anesthetic total: 2 ml  Complexity: complex Blunt dissection to break up loculations  Drainage: purulent  Drainage amount: moderate  Patient tolerance: Patient tolerated the procedure well with no immediate complications.      Results for orders placed during the hospital encounter of 04/17/14  WET PREP, GENITAL      Result Value Ref Range   Yeast Wet Prep HPF POC RARE (*) NONE SEEN   Trich, Wet Prep NONE SEEN  NONE SEEN   Clue Cells Wet Prep HPF POC FEW (*) NONE SEEN   WBC, Wet Prep HPF POC NONE SEEN  NONE SEEN  CBC WITH DIFFERENTIAL      Result Value Ref Range   WBC 7.9  4.0 - 10.5 K/uL   RBC 3.81 (*) 3.87 - 5.11 MIL/uL   Hemoglobin 7.4 (*) 12.0 - 15.0 g/dL   HCT 24.6 (*) 36.0 - 46.0 %   MCV 64.6 (*) 78.0 - 100.0 fL   MCH 19.4 (*) 26.0 - 34.0 pg   MCHC 30.1  30.0 - 36.0 g/dL   RDW 18.3 (*) 11.5 - 15.5 %   Platelets 406 (*) 150 - 400 K/uL   Neutrophils Relative %  58  43 - 77 %   Lymphocytes Relative 30  12 - 46 %   Monocytes Relative 8  3 - 12 %   Eosinophils Relative 4  0 - 5 %   Basophils Relative 0  0 - 1 %   Neutro Abs 4.6  1.7 - 7.7 K/uL   Lymphs Abs 2.4  0.7 - 4.0 K/uL   Monocytes Absolute 0.6  0.1 - 1.0 K/uL   Eosinophils Absolute 0.3  0.0 - 0.7 K/uL   Basophils Absolute 0.0  0.0 - 0.1 K/uL   Smear Review MORPHOLOGY UNREMARKABLE    COMPREHENSIVE METABOLIC PANEL      Result Value Ref Range   Sodium 139  137 - 147 mEq/L   Potassium 3.2 (*) 3.7 - 5.3 mEq/L   Chloride 99  96 - 112 mEq/L   CO2 27  19 - 32 mEq/L   Glucose, Bld 89  70 - 99 mg/dL   BUN 11  6 - 23 mg/dL   Creatinine, Ser 0.86  0.50 - 1.10 mg/dL   Calcium 9.4  8.4 - 10.5 mg/dL   Total Protein 7.3  6.0 - 8.3 g/dL   Albumin 3.6  3.5 - 5.2 g/dL   AST 18  0 - 37 U/L   ALT 14  0 - 35 U/L   Alkaline  Phosphatase 57  39 - 117 U/L   Total Bilirubin <0.2 (*) 0.3 - 1.2 mg/dL   GFR calc non Af Amer 83 (*) >90 mL/min   GFR calc Af Amer >90  >90 mL/min   Anion gap 13  5 - 15  LIPASE, BLOOD      Result Value Ref Range   Lipase 15  11 - 59 U/L  URINALYSIS, ROUTINE W REFLEX MICROSCOPIC      Result Value Ref Range   Color, Urine YELLOW  YELLOW   APPearance CLEAR  CLEAR   Specific Gravity, Urine 1.020  1.005 - 1.030   pH 5.5  5.0 - 8.0   Glucose, UA NEGATIVE  NEGATIVE mg/dL   Hgb urine dipstick LARGE (*) NEGATIVE   Bilirubin Urine NEGATIVE  NEGATIVE   Ketones, ur NEGATIVE  NEGATIVE mg/dL   Protein, ur NEGATIVE  NEGATIVE mg/dL   Urobilinogen, UA 0.2  0.0 - 1.0 mg/dL   Nitrite NEGATIVE  NEGATIVE   Leukocytes, UA NEGATIVE  NEGATIVE  URINE MICROSCOPIC-ADD ON      Result Value Ref Range   Squamous Epithelial / LPF RARE  RARE   WBC, UA 0-2  <3 WBC/hpf   RBC / HPF 11-20  <3 RBC/hpf  POC URINE PREG, ED      Result Value Ref Range   Preg Test, Ur NEGATIVE  NEGATIVE  POC OCCULT BLOOD, ED      Result Value Ref Range   Fecal Occult Bld NEGATIVE  NEGATIVE   US Transvaginal  Non-ob  04/17/2014   CLINICAL DATA:  Pelvic pain on the right side.  EXAM: TRANSABDOMINAL AND TRANSVAGINAL ULTRASOUND OF PELVIS  DOPPLER ULTRASOUND OF OVARIES  TECHNIQUE: Both transabdominal and transvaginal ultrasound examinations of the pelvis were performed. Transabdominal technique was performed for global imaging of the pelvis including uterus, ovaries, adnexal regions, and pelvic cul-de-sac.  It was necessary to proceed with endovaginal exam following the transabdominal exam to visualize the ovaries. Color and duplex Doppler ultrasound was utilized to evaluate blood flow to the ovaries.  COMPARISON:  CT abdomen and pelvis 03/20/2013. Pelvic ultrasound 04/01/2013.  FINDINGS: Uterus  Measurements: 12.0 x 7.4 x 6.6 cm. Anterior fibroid measures 3.7 cm in diameter.  Endometrium  Thickness:  10 mm.  No focal abnormality visualized.  Right ovary  Measurements: 5.6 x 3.3 x 2.5 cm. Calcifications and increased echogenicity consistent with known dermoid are seen.  Left ovary  Not visualized.  Pulsed Doppler evaluation of both ovaries demonstrates normal low-resistance arterial and venous waveforms.  Other findings  No free fluid.  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  The left ovary is not visualized.  Single uterine fibroid measuring 3.7 cm.   Electronically Signed   By: Inge Rise M.D.   On: 04/17/2014 17:20   US Pelvis Complete  04/17/2014   CLINICAL DATA:  Pelvic pain on the right side.  EXAM: TRANSABDOMINAL AND TRANSVAGINAL ULTRASOUND OF PELVIS  DOPPLER ULTRASOUND OF OVARIES  TECHNIQUE: Both transabdominal and transvaginal ultrasound examinations of the pelvis were performed. Transabdominal technique was performed for global imaging of the pelvis including uterus, ovaries, adnexal regions, and pelvic cul-de-sac.  It was necessary to proceed with endovaginal exam following the transabdominal exam to visualize the ovaries. Color and duplex Doppler ultrasound was utilized to evaluate  blood flow to the ovaries.  COMPARISON:  CT abdomen and pelvis 03/20/2013. Pelvic ultrasound 04/01/2013.  FINDINGS: Uterus  Measurements: 12.0 x 7.4 x 6.6 cm. Anterior fibroid measures  3.7 cm in diameter.  Endometrium  Thickness:  10 mm.  No focal abnormality visualized.  Right ovary  Measurements: 5.6 x 3.3 x 2.5 cm. Calcifications and increased echogenicity consistent with known dermoid are seen.  Left ovary  Not visualized.  Pulsed Doppler evaluation of both ovaries demonstrates normal low-resistance arterial and venous waveforms.  Other findings  No free fluid.  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  The left ovary is not visualized.  Single uterine fibroid measuring 3.7 cm.   Electronically Signed   By: Inge Rise M.D.   On: 04/17/2014 17:20   [place] Art/ven Flow Abd Pelv Doppler  04/17/2014   CLINICAL DATA:  Pelvic pain on the right side.  EXAM: TRANSABDOMINAL AND TRANSVAGINAL ULTRASOUND OF PELVIS  DOPPLER ULTRASOUND OF OVARIES  TECHNIQUE: Both transabdominal and transvaginal ultrasound examinations of the pelvis were performed. Transabdominal technique was performed for global imaging of the pelvis including uterus, ovaries, adnexal regions, and pelvic cul-de-sac.  It was necessary to proceed with endovaginal exam following the transabdominal exam to visualize the ovaries. Color and duplex Doppler ultrasound was utilized to evaluate blood flow to the ovaries.  COMPARISON:  CT abdomen and pelvis 03/20/2013. Pelvic ultrasound 04/01/2013.  FINDINGS: Uterus  Measurements: 12.0 x 7.4 x 6.6 cm. Anterior fibroid measures 3.7 cm in diameter.  Endometrium  Thickness:  10 mm.  No focal abnormality visualized.  Right ovary  Measurements: 5.6 x 3.3 x 2.5 cm. Calcifications and increased echogenicity consistent with known dermoid are seen.  Left ovary  Not visualized.  Pulsed Doppler evaluation of both ovaries demonstrates normal low-resistance arterial and venous waveforms.  Other findings  No  free fluid.  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  The left ovary is not visualized.  Single uterine fibroid measuring 3.7 cm.   Electronically Signed   By: Inge Rise M.D.   On: 04/17/2014 17:20      Elwyn Lade, PA-C 04/18/14 0208

## 2014-04-17 NOTE — ED Notes (Signed)
Pt alert and oriented x4. Respirations even and unlabored, bilateral symmetrical rise and fall of chest. Skin warm and dry. In no acute distress. Denies needs.   

## 2014-04-17 NOTE — ED Provider Notes (Signed)
CSN: [id number]     Arrival date & time 04/17/14  1253 History   First MD Initiated Contact with Patient 04/17/14 1256     Chief Complaint  Patient presents with  . Abdominal Pain     (Consider location/radiation/quality/duration/timing/severity/associated sxs/prior Treatment) The history is provided by the patient. No language interpreter was used.  Anne Schaefer is a 43 y/o F with PMHx of asthma, obesity, diverticulitis, HTN, ovarian cysts presenting to the emergency department with abdominal pain that started at approximately 11:50 AM this morning described as a cramping sensation that is now turned into a sharp shooting pain localized to the right lower quadrant, right groin. Reported that she's been using Tylenol with minimal relief. Stated that she started her last menstrual cycle on Saturday. Stated that she has history of ovarian cysts, but has never followed up regarding this. Denied fever, chills, chest pain, shortness of breath, difficulty breathing, melena, hematuria, dysuria hematochezia, nausea, vomiting, diarrhea, abnormal vaginal bleeding. PCP Dr. Doreene Burke  Past Medical History  Diagnosis Date  . Asthma   . Obesity   . Seasonal allergies   . Diverticulitis   . Ovarian cyst   . Infection     trich  . Hypertension    Past Surgical History  Procedure Laterality Date  . Tubal ligation    . Cesarean section    . Forehead reconstruction      as a child   Family History  Problem Relation Age of Onset  . Diabetes Mother   . Heart disease Mother     chf  . Hypertension Father    History  Substance Use Topics  . Smoking status: Current Some Day Smoker -- 0.25 packs/day for 8 years    Types: Cigarettes  . Smokeless tobacco: Never Used     Comment: 3-4 cigs per day  . Alcohol Use: 0.0 oz/week   OB History   Grav Para Term Preterm Abortions TAB SAB Ect Mult Living   5 5 4 1  0 0 0 0 0 5     Review of Systems  Constitutional: Negative for fever and chills.   Respiratory: Negative for chest tightness and shortness of breath.   Cardiovascular: Negative for chest pain.  Gastrointestinal: Positive for abdominal pain. Negative for nausea, vomiting, diarrhea, constipation, blood in stool and anal bleeding.  Genitourinary: Positive for vaginal bleeding (Patient currently menstruating) and pelvic pain (Right). Negative for dysuria, vaginal discharge and vaginal pain.  Musculoskeletal: Negative for back pain and neck pain.      Allergies  Ibuprofen; Azithromycin; and Other  Home Medications   Prior to Admission medications   Medication Sig Start Date End Date Taking? Authorizing Provider  acetaminophen (TYLENOL) 500 MG tablet Take 1,000 mg by mouth every 8 (eight) hours as needed for mild pain (pain).    Yes Historical Provider, MD  albuterol (PROVENTIL HFA;VENTOLIN HFA) 108 (90 BASE) MCG/ACT inhaler Inhale 2 puffs into the lungs every 6 (six) hours as needed for wheezing or shortness of breath (wheezing).   Yes Historical Provider, MD  hydrochlorothiazide (HYDRODIURIL) 25 MG tablet Take 1 tablet (25 mg total) by mouth daily. 03/08/14  Yes Lorayne Marek, MD  loratadine (CLARITIN) 10 MG tablet Take 1 tablet (10 mg total) by mouth daily. 02/26/14  Yes Tresa Garter, MD  mometasone-formoterol (DULERA) 200-5 MCG/ACT AERO Inhale 2 puffs into the lungs 2 (two) times daily. 03/15/14  Yes Olugbemiga E Doreene Burke, MD   BP 114/59  Pulse 86  Temp(Src) 97.4  F (36.3 C) (Oral)  Resp 20  SpO2 99% Physical Exam  Nursing note and vitals reviewed. Constitutional: She is oriented to person, place, and time. She appears well-developed and well-nourished. No distress.  HENT:  Head: Normocephalic and atraumatic.  Mouth/Throat: Oropharynx is clear and moist. No oropharyngeal exudate.  Eyes: Conjunctivae and EOM are normal. Pupils are equal, round, and reactive to light. Right eye exhibits no discharge. Left eye exhibits no discharge.  Neck: Normal range of motion.  Neck supple. No tracheal deviation present.  Cardiovascular: Normal rate, regular rhythm and normal heart sounds.  Exam reveals no friction rub.   No murmur heard. Pulmonary/Chest: Effort normal and breath sounds normal. No respiratory distress. She has no wheezes. She has no rales.  Abdominal: Soft. Normal appearance and bowel sounds are normal. She exhibits no distension. There is tenderness in the right lower quadrant and suprapubic area. There is no rebound and no guarding.  Obese Negative abdominal distention Bowel sounds normal active in all 4 quadrants Abdomen soft upon palpation Discomfort upon palpation to the suprapubic, right lower quadrant Negative peritoneal signs  Genitourinary:  Pelvic exam: Negative swelling, erythema, inflammation, lesions, sores, deformities identified to the external genitalia and vaginal canal. Bright red blood in the vaginal vault. Negative CMT. Mild suprapubic discomfort upon palpation. Right adnexal tenderness noted. Negative left adnexal tenderness. Exam chaperoned with tech  Rectal Exam: Negative swelling, erythema, inflammation, lesions, sores, deformities, hemorrhoids. Strong sphincter tone. Negative palpation of lesions in the rectum. Negative blood on glove. Brown stool noted on glove.   Musculoskeletal: Normal range of motion.  Full ROM to upper and lower extremities without difficulty noted, negative ataxia noted.  Lymphadenopathy:    She has no cervical adenopathy.  Neurological: She is alert and oriented to person, place, and time. No cranial nerve deficit. She exhibits normal muscle tone. Coordination normal.  Skin: Skin is warm and dry. No rash noted. She is not diaphoretic. No erythema.  Psychiatric: She has a normal mood and affect. Her behavior is normal. Thought content normal.    ED Course  Procedures (including critical care time)  Results for orders placed during the hospital encounter of 04/17/14  WET PREP, GENITAL      Result  Value Ref Range   Yeast Wet Prep HPF POC RARE (*) NONE SEEN   Trich, Wet Prep NONE SEEN  NONE SEEN   Clue Cells Wet Prep HPF POC FEW (*) NONE SEEN   WBC, Wet Prep HPF POC NONE SEEN  NONE SEEN  CBC WITH DIFFERENTIAL      Result Value Ref Range   WBC 7.9  4.0 - 10.5 K/uL   RBC 3.81 (*) 3.87 - 5.11 MIL/uL   Hemoglobin 7.4 (*) 12.0 - 15.0 g/dL   HCT 24.6 (*) 36.0 - 46.0 %   MCV 64.6 (*) 78.0 - 100.0 fL   MCH 19.4 (*) 26.0 - 34.0 pg   MCHC 30.1  30.0 - 36.0 g/dL   RDW 18.3 (*) 11.5 - 15.5 %   Platelets 406 (*) 150 - 400 K/uL   Neutrophils Relative % 58  43 - 77 %   Lymphocytes Relative 30  12 - 46 %   Monocytes Relative 8  3 - 12 %   Eosinophils Relative 4  0 - 5 %   Basophils Relative 0  0 - 1 %   Neutro Abs 4.6  1.7 - 7.7 K/uL   Lymphs Abs 2.4  0.7 - 4.0 K/uL   Monocytes  Absolute 0.6  0.1 - 1.0 K/uL   Eosinophils Absolute 0.3  0.0 - 0.7 K/uL   Basophils Absolute 0.0  0.0 - 0.1 K/uL   Smear Review MORPHOLOGY UNREMARKABLE    COMPREHENSIVE METABOLIC PANEL      Result Value Ref Range   Sodium 139  137 - 147 mEq/L   Potassium 3.2 (*) 3.7 - 5.3 mEq/L   Chloride 99  96 - 112 mEq/L   CO2 27  19 - 32 mEq/L   Glucose, Bld 89  70 - 99 mg/dL   BUN 11  6 - 23 mg/dL   Creatinine, Ser 0.86  0.50 - 1.10 mg/dL   Calcium 9.4  8.4 - 10.5 mg/dL   Total Protein 7.3  6.0 - 8.3 g/dL   Albumin 3.6  3.5 - 5.2 g/dL   AST 18  0 - 37 U/L   ALT 14  0 - 35 U/L   Alkaline Phosphatase 57  39 - 117 U/L   Total Bilirubin <0.2 (*) 0.3 - 1.2 mg/dL   GFR calc non Af Amer 83 (*) >90 mL/min   GFR calc Af Amer >90  >90 mL/min   Anion gap 13  5 - 15  LIPASE, BLOOD      Result Value Ref Range   Lipase 15  11 - 59 U/L  URINALYSIS, ROUTINE W REFLEX MICROSCOPIC      Result Value Ref Range   Color, Urine YELLOW  YELLOW   APPearance CLEAR  CLEAR   Specific Gravity, Urine 1.020  1.005 - 1.030   pH 5.5  5.0 - 8.0   Glucose, UA NEGATIVE  NEGATIVE mg/dL   Hgb urine dipstick LARGE (*) NEGATIVE   Bilirubin Urine  NEGATIVE  NEGATIVE   Ketones, ur NEGATIVE  NEGATIVE mg/dL   Protein, ur NEGATIVE  NEGATIVE mg/dL   Urobilinogen, UA 0.2  0.0 - 1.0 mg/dL   Nitrite NEGATIVE  NEGATIVE   Leukocytes, UA NEGATIVE  NEGATIVE  URINE MICROSCOPIC-ADD ON      Result Value Ref Range   Squamous Epithelial / LPF RARE  RARE   WBC, UA 0-2  <3 WBC/hpf   RBC / HPF 11-20  <3 RBC/hpf  POC URINE PREG, ED      Result Value Ref Range   Preg Test, Ur NEGATIVE  NEGATIVE    Labs Review Labs Reviewed  CBC WITH DIFFERENTIAL - Abnormal; Notable for the following:    RBC 3.81 (*)    Hemoglobin 7.4 (*)    HCT 24.6 (*)    MCV 64.6 (*)    MCH 19.4 (*)    RDW 18.3 (*)    Platelets 406 (*)    All other components within normal limits  COMPREHENSIVE METABOLIC PANEL - Abnormal; Notable for the following:    Potassium 3.2 (*)    Total Bilirubin <0.2 (*)    GFR calc non Af Amer 83 (*)    All other components within normal limits  URINALYSIS, ROUTINE W REFLEX MICROSCOPIC - Abnormal; Notable for the following:    Hgb urine dipstick LARGE (*)    All other components within normal limits  LIPASE, BLOOD  URINE MICROSCOPIC-ADD ON  POC URINE PREG, ED    Imaging Review No results found.   EKG Interpretation None      MDM   Final diagnoses:  None    Medications  sodium chloride 0.9 % bolus 1,000 mL (0 mLs Intravenous Stopped 04/17/14 1649)  morphine 4 MG/ML injection 4  mg (4 mg Intravenous Given 04/17/14 1520)   Filed Vitals:   04/17/14 1255 04/17/14 1430 04/17/14 1600  BP: 114/59 134/94 137/79  Pulse: 86 79 85  Temp: 97.4 F (36.3 C)    TempSrc: Oral    Resp: 20 16 15   SpO2: 99% 96% 99%    CBC negative elevated white blood cell count. Hemoglobin 7.4, hematocrit 24.6-patient does have history of microcytic anemia. CMP unremarkable. Lipase negative elevation. Urine pregnancy. Urinalysis negative nitrites, negative leukocytes urine wet prep rare for yeast, few clue cells-negative findings of white blood cells or  Trichomonas. GC, Chlamydia probe pending. Fecal occult negative.  [place] pending to rule out infection versus hemorrhagic cysts. Recommended patient to be re-assessed, if pain continues patient may need CT to be performed. Discussed case with Cleatrice Burke, PA-C. Transfer of care to [facility], PA-C.   Jamse Mead, PA-C 04/17/14 1754

## 2014-04-17 NOTE — Discharge Instructions (Signed)
Abscess An abscess is an infected area that contains a collection of pus and debris.It can occur in almost any part of the body. An abscess is also known as a furuncle or boil. CAUSES  An abscess occurs when tissue gets infected. This can occur from blockage of oil or sweat glands, infection of hair follicles, or a minor injury to the skin. As the body tries to fight the infection, pus collects in the area and creates pressure under the skin. This pressure causes pain. People with weakened immune systems have difficulty fighting infections and get certain abscesses more often.  SYMPTOMS Usually an abscess develops on the skin and becomes a painful mass that is red, warm, and tender. If the abscess forms under the skin, you may feel a moveable soft area under the skin. Some abscesses break open (rupture) on their own, but most will continue to get worse without care. The infection can spread deeper into the body and eventually into the bloodstream, causing you to feel ill.  DIAGNOSIS  Your caregiver will take your medical history and perform a physical exam. A sample of fluid may also be taken from the abscess to determine what is causing your infection. TREATMENT  Your caregiver may prescribe antibiotic medicines to fight the infection. However, taking antibiotics alone usually does not cure an abscess. Your caregiver may need to make a small cut (incision) in the abscess to drain the pus. In some cases, gauze is packed into the abscess to reduce pain and to continue draining the area. HOME CARE INSTRUCTIONS   Only take over-the-counter or prescription medicines for pain, discomfort, or fever as directed by your caregiver.  If you were prescribed antibiotics, take them as directed. Finish them even if you start to feel better.  If gauze is used, follow your caregiver's directions for changing the gauze.  To avoid spreading the infection:  Keep your draining abscess covered with a  bandage.  Wash your hands well.  Do not share personal care items, towels, or whirlpools with others.  Avoid skin contact with others.  Keep your skin and clothes clean around the abscess.  Keep all follow-up appointments as directed by your caregiver. SEEK MEDICAL CARE IF:   You have increased pain, swelling, redness, fluid drainage, or bleeding.  You have muscle aches, chills, or a general ill feeling.  You have a fever. MAKE SURE YOU:   Understand these instructions.  Will watch your condition.  Will get help right away if you are not doing well or get worse. Document Released: 04/07/2005 Document Revised: 12/28/2011 Document Reviewed: 09/10/2011 [facility] Patient Information 2015 [place], [place]. This information is not intended to replace advice given to you by your health care provider. Make sure you discuss any questions you have with your health care provider.   Uterine Fibroid A uterine fibroid is a growth (tumor) that occurs in your uterus. This type of tumor is not cancerous and does not spread out of the uterus. You can have one or many fibroids. Fibroids can vary in size, weight, and where they grow in the uterus. Some can become quite large. Most fibroids do not require medical treatment, but some can cause pain or heavy bleeding during and between periods. CAUSES  A fibroid is the result of a single uterine cell that keeps growing (unregulated), which is different than most cells in the human body. Most cells have a control mechanism that keeps them from reproducing without control.  SIGNS AND SYMPTOMS  Bleeding.  Pelvic pain and pressure.  Bladder problems due to the size of the fibroid.  Infertility and miscarriages depending on the size and location of the fibroid. DIAGNOSIS  Uterine fibroids are diagnosed through a physical exam. Your health care provider may feel the lumpy tumors during a pelvic exam. Ultrasonography may be done to get information  regarding size, location, and number of tumors.  TREATMENT   Your health care provider may recommend watchful waiting. This involves getting the fibroid checked by your health care provider to see if it grows or shrinks.   Hormone treatment or an intrauterine device (IUD) may be prescribed.   Surgery may be needed to remove the fibroids (myomectomy) or the uterus (hysterectomy). This depends on your situation. When fibroids interfere with fertility and a woman wants to become pregnant, a health care provider may recommend having the fibroids removed.  [place] care depends on how you were treated. In general:   Keep all follow-up appointments with your health care provider.   Only take over-the-counter or prescription medicines as directed by your health care provider. If you were prescribed a hormone treatment, take the hormone medicines exactly as directed. Do not take aspirin. It can cause bleeding.   Talk to your health care provider about taking iron pills.  If your periods are troublesome but not so heavy, lie down with your feet raised slightly above your heart. Place cold packs on your lower abdomen.   If your periods are heavy, write down the number of pads or tampons you use per month. Bring this information to your health care provider.   Include green vegetables in your diet.  SEEK IMMEDIATE MEDICAL CARE IF:  You have pelvic pain or cramps not controlled with medicines.   You have a sudden increase in pelvic pain.   You have an increase in bleeding between and during periods.   You have excessive periods and soak tampons or pads in a half hour or less.  You feel lightheaded or have fainting episodes. Document Released: 06/25/2000 Document Revised: 04/18/2013 Document Reviewed: 01/25/2013 [facility] Patient Information 2015 [place], [place]. This information is not intended to replace advice given to you by your health care provider. Make sure  you discuss any questions you have with your health care provider.

## 2014-04-18 LAB — GC/CHLAMYDIA PROBE AMP
CT PROBE, AMP APTIMA: NEGATIVE
GC Probe RNA: NEGATIVE

## 2014-04-18 NOTE — ED Provider Notes (Signed)
Medical screening examination/treatment/procedure(s) were conducted as a shared visit with non-physician practitioner(s) and myself.  I personally evaluated the patient during the encounter.   EKG Interpretation None     Patient abdominal pain. Moderate tenderness. CT scan done without clear cause. White count is elevated. Ultrasound will be done  [employer]. Alvino Chapel, [place] 04/18/14 [phone]

## 2014-04-20 NOTE — ED Provider Notes (Signed)
Medical screening examination/treatment/procedure(s) were performed by non-physician practitioner and as supervising physician I was immediately available for consultation/collaboration.   EKG Interpretation None        Hoy Morn, MD 04/20/14 [phone]

## 2014-05-13 ENCOUNTER — Encounter (HOSPITAL_COMMUNITY): Payer: Self-pay | Admitting: Emergency Medicine

## 2014-06-11 ENCOUNTER — Encounter (HOSPITAL_COMMUNITY): Payer: Self-pay | Admitting: Emergency Medicine

## 2014-06-11 ENCOUNTER — Emergency Department (HOSPITAL_COMMUNITY)
Admission: EM | Admit: 2014-06-11 | Discharge: 2014-06-11 | Disposition: A | Discharge status: Home / Self Care | Payer: Worker's Compensation | Attending: Emergency Medicine | Admitting: Emergency Medicine

## 2014-06-11 ENCOUNTER — Emergency Department (HOSPITAL_COMMUNITY): Payer: Worker's Compensation

## 2014-06-11 DIAGNOSIS — Y9269 Other specified industrial and construction area as the place of occurrence of the external cause: Secondary | ICD-10-CM | POA: Insufficient documentation

## 2014-06-11 DIAGNOSIS — S92355A Nondisplaced fracture of fifth metatarsal bone, left foot, initial encounter for closed fracture: Secondary | ICD-10-CM | POA: Insufficient documentation

## 2014-06-11 DIAGNOSIS — Z8619 Personal history of other infectious and parasitic diseases: Secondary | ICD-10-CM | POA: Insufficient documentation

## 2014-06-11 DIAGNOSIS — I10 Essential (primary) hypertension: Secondary | ICD-10-CM | POA: Insufficient documentation

## 2014-06-11 DIAGNOSIS — Y9389 Activity, other specified: Secondary | ICD-10-CM | POA: Insufficient documentation

## 2014-06-11 DIAGNOSIS — Y990 Civilian activity done for income or pay: Secondary | ICD-10-CM | POA: Insufficient documentation

## 2014-06-11 DIAGNOSIS — W19XXXA Unspecified fall, initial encounter: Secondary | ICD-10-CM

## 2014-06-11 DIAGNOSIS — Z7951 Long term (current) use of inhaled steroids: Secondary | ICD-10-CM | POA: Insufficient documentation

## 2014-06-11 DIAGNOSIS — X58XXXA Exposure to other specified factors, initial encounter: Secondary | ICD-10-CM | POA: Insufficient documentation

## 2014-06-11 DIAGNOSIS — Z79899 Other long term (current) drug therapy: Secondary | ICD-10-CM | POA: Insufficient documentation

## 2014-06-11 DIAGNOSIS — Z8742 Personal history of other diseases of the female genital tract: Secondary | ICD-10-CM | POA: Insufficient documentation

## 2014-06-11 DIAGNOSIS — J45909 Unspecified asthma, uncomplicated: Secondary | ICD-10-CM | POA: Insufficient documentation

## 2014-06-11 DIAGNOSIS — Z8719 Personal history of other diseases of the digestive system: Secondary | ICD-10-CM | POA: Insufficient documentation

## 2014-06-11 DIAGNOSIS — E669 Obesity, unspecified: Secondary | ICD-10-CM | POA: Insufficient documentation

## 2014-06-11 DIAGNOSIS — Z720 Tobacco use: Secondary | ICD-10-CM | POA: Insufficient documentation

## 2014-06-11 MED ORDER — TRAMADOL HCL 50 MG PO TABS
50.0000 mg | ORAL_TABLET | Freq: Once | ORAL | Status: AC
  Administered 2014-06-11: 50 mg via ORAL
  Filled 2014-06-11: qty 1

## 2014-06-11 MED ORDER — HYDROCODONE-ACETAMINOPHEN 5-325 MG PO TABS: 2.0000 | ORAL_TABLET | ORAL | Status: DC | PRN

## 2014-06-11 NOTE — Progress Notes (Signed)
Orthopedic Tech Progress Note Patient Details:  Anne Schaefer 1970-10-25 [id number] PLS applied to LLE. Application tolerated well. Crutches fit for height and comfort. Ortho Devices Type of Ortho Device: Ace wrap, Post (short leg) splint Ortho Device/Splint Location: LLE Ortho Device/Splint Interventions: Application   Asia R Thompson 06/11/2014, 10:46 AM

## 2014-06-11 NOTE — ED Provider Notes (Signed)
CSN: [id number]     Arrival date & time 06/11/14  0820 History  This chart was scribed for non-physician practitioner working with Janice Norrie, MD by Molli Posey, ED Scribe. This patient was seen in room TR08C/TR08C and the patient's care was started at 9:50 AM.    Chief Complaint  Patient presents with  . Foot Pain   The history is provided by the patient. No language interpreter was used.   HPI Comments: Anne Schaefer is a 43 y.o. female who presents to the Emergency Department complaining of left foot pain that occurred after pt tripped at work 2 days ago. She says she was moving a chair at work and twisted her left foot. She states she has increased pain with weight bearing and states it radiates up her foot. She reports trouble sleeping and associated swelling to the area. She says she has been taking tylenol which has provided minimal relief to her pain. 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 Pt reports she is allergic to ibuprofen.    Past Medical History  Diagnosis Date  . Asthma   . Obesity   . Seasonal allergies   . Diverticulitis   . Ovarian cyst   . Infection     trich  . Hypertension    Past Surgical History  Procedure Laterality Date  . Tubal ligation    . Cesarean section    . Forehead reconstruction      as a child   Family History  Problem Relation Age of Onset  . Diabetes Mother   . Heart disease Mother     chf  . Hypertension Father    History  Substance Use Topics  . Smoking status: Current Some Day Smoker -- 0.25 packs/day for 8 years    Types: Cigarettes  . Smokeless tobacco: Never Used     Comment: 3-4 cigs per day  . Alcohol Use: 0.0 oz/week   OB History    Gravida Para Term Preterm AB TAB SAB Ectopic Multiple Living   5 5 4 1  0 0 0 0 0 5     Review of Systems  Musculoskeletal: Positive for joint swelling and arthralgias.  All other systems reviewed and are negative.     Allergies  Ibuprofen;  Azithromycin; and Other  Home Medications   Prior to Admission medications   Medication Sig Start Date End Date Taking? Authorizing Provider  acetaminophen (TYLENOL) 500 MG tablet Take 1,000 mg by mouth every 8 (eight) hours as needed for mild pain (pain).     Historical Provider, MD  albuterol (PROVENTIL HFA;VENTOLIN HFA) 108 (90 BASE) MCG/ACT inhaler Inhale 2 puffs into the lungs every 6 (six) hours as needed for wheezing or shortness of breath (wheezing).    Historical Provider, MD  hydrochlorothiazide (HYDRODIURIL) 25 MG tablet Take 1 tablet (25 mg total) by mouth daily. 03/08/14   Lorayne Marek, MD  loratadine (CLARITIN) 10 MG tablet Take 1 tablet (10 mg total) by mouth daily. 02/26/14   Tresa Garter, MD  mometasone-formoterol (DULERA) 200-5 MCG/ACT AERO Inhale 2 puffs into the lungs 2 (two) times daily. 03/15/14   Tresa Garter, MD  ondansetron (ZOFRAN) 4 MG tablet Take 1 tablet (4 mg total) by mouth every 6 (six) hours. 04/17/14   Elwyn Lade, PA-C  oxyCODONE-acetaminophen (PERCOCET/ROXICET) 5-325 MG per tablet Take 1-2 tablets by mouth every 6 (six) hours as needed for moderate pain or severe  pain. 04/17/14   Elwyn Lade, PA-C   BP 139/75 mmHg  Pulse 97  Temp(Src) 98.6 F (37 C) (Oral)  Resp 18  SpO2 99%  LMP 06/02/2014 (Approximate) Physical Exam  Constitutional: She is oriented to person, place, and time. She appears well-developed and well-nourished.  HENT:  Head: Normocephalic and atraumatic.  Neck: Normal range of motion. Neck supple. No tracheal deviation present.  Cardiovascular: Normal rate and intact distal pulses.   Pulmonary/Chest: Effort normal. No respiratory distress.  Abdominal: She exhibits no distension.  Musculoskeletal:  4th and 5th metatarsals TTP. Mild generalized edema, no obvious deformity. Pt was able to wiggle toes.   Neurological: She is alert and oriented to person, place, and time.  Skin: Skin is warm and dry.  Psychiatric: She has  a normal mood and affect. Her behavior is normal.  Nursing note and vitals reviewed.   ED Course  Procedures  DIAGNOSTIC STUDIES: Oxygen Saturation is 99% on RA, normal by my interpretation.    COORDINATION OF CARE: 9:55 AM Discussed treatment plan with pt at bedside and pt agreed to plan.   Labs Review Labs Reviewed - No data to display  Imaging Review Dg Foot Complete Left  06/11/2014   CLINICAL DATA:  Pain and swelling following twisting injury  EXAM: LEFT FOOT - COMPLETE 3+ VIEW  COMPARISON:  None.  FINDINGS: Frontal, oblique, and lateral views were obtained. There is a transversely oriented fracture through the proximal diaphysis of the fifth metatarsal. Alignment is near anatomic. No other fracture. No dislocation. Joint spaces appear intact. No erosive change.  IMPRESSION: Transversely oriented fracture proximal fifth metatarsal diaphysis. Alignment near anatomic.   Electronically Signed   By: Lowella Grip M.D.   On: 06/11/2014 68:34   SPLINT APPLICATION Date/Time: 19:62 AM Authorized by: Alvina Chou Consent: Verbal consent obtained. Risks and benefits: risks, benefits and alternatives were discussed Consent given by: patient Splint applied by: orthopedic technician Location details: left foot Splint type: short leg Supplies used: orthoglass Post-procedure: The splinted body part was neurovascularly unchanged following the procedure. Patient tolerance: Patient tolerated the procedure well with no immediate complications.      EKG Interpretation None      MDM   Final diagnoses:  Fall  Closed nondisplaced fracture of fifth left metatarsal bone, initial encounter    10:12 AM Patient's xray shows fifth metatarsal fracture on the left. No neurovascular compromise. Patient will have foot splint and be non weight bearing. Patient will have Orthopedic follow up.   I personally performed the services described in this documentation, which was scribed in my  presence. The recorded information has been reviewed and is accurate.      Alvina Chou, PA-C 06/11/14 La Rosita Knapp, MD 06/11/14 [id number]

## 2014-06-11 NOTE — Discharge Instructions (Signed)
Keep splint intact and follow up with Dr. Percell Miller for further evaluation of your fracture. Take Vicodin as needed for pain. Rest, ice, and elevate your foot. Refer to attached documents for more information.

## 2014-06-11 NOTE — ED Notes (Signed)
Ortho tech paged  

## 2014-06-11 NOTE — ED Notes (Signed)
Ortho called and on their way to place splint and give crutches.

## 2014-06-11 NOTE — ED Notes (Addendum)
Pt tripped at work on Sunday; tripped, pain and swelling  To left foot since then. Pain with weight bearing. 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 Swelling noted in traige.

## 2014-07-05 ENCOUNTER — Emergency Department (HOSPITAL_COMMUNITY)
Admission: EM | Admit: 2014-07-05 | Discharge: 2014-07-05 | Disposition: A | Discharge status: Home / Self Care | Payer: Self-pay | Attending: Emergency Medicine | Admitting: Emergency Medicine

## 2014-07-05 ENCOUNTER — Encounter (HOSPITAL_COMMUNITY): Payer: Self-pay | Admitting: Emergency Medicine

## 2014-07-05 DIAGNOSIS — Z8619 Personal history of other infectious and parasitic diseases: Secondary | ICD-10-CM | POA: Insufficient documentation

## 2014-07-05 DIAGNOSIS — R110 Nausea: Secondary | ICD-10-CM | POA: Insufficient documentation

## 2014-07-05 DIAGNOSIS — R51 Headache: Secondary | ICD-10-CM | POA: Insufficient documentation

## 2014-07-05 DIAGNOSIS — Z79899 Other long term (current) drug therapy: Secondary | ICD-10-CM | POA: Insufficient documentation

## 2014-07-05 DIAGNOSIS — Z8742 Personal history of other diseases of the female genital tract: Secondary | ICD-10-CM | POA: Insufficient documentation

## 2014-07-05 DIAGNOSIS — J45909 Unspecified asthma, uncomplicated: Secondary | ICD-10-CM | POA: Insufficient documentation

## 2014-07-05 DIAGNOSIS — Z760 Encounter for issue of repeat prescription: Secondary | ICD-10-CM | POA: Insufficient documentation

## 2014-07-05 DIAGNOSIS — Z720 Tobacco use: Secondary | ICD-10-CM | POA: Insufficient documentation

## 2014-07-05 DIAGNOSIS — R519 Headache, unspecified: Secondary | ICD-10-CM

## 2014-07-05 DIAGNOSIS — E669 Obesity, unspecified: Secondary | ICD-10-CM | POA: Insufficient documentation

## 2014-07-05 DIAGNOSIS — Z8719 Personal history of other diseases of the digestive system: Secondary | ICD-10-CM | POA: Insufficient documentation

## 2014-07-05 DIAGNOSIS — I10 Essential (primary) hypertension: Secondary | ICD-10-CM | POA: Insufficient documentation

## 2014-07-05 DIAGNOSIS — Z7951 Long term (current) use of inhaled steroids: Secondary | ICD-10-CM | POA: Insufficient documentation

## 2014-07-05 MED ORDER — HYDROCODONE-ACETAMINOPHEN 5-325 MG PO TABS: 1.0000 | ORAL_TABLET | Freq: Four times a day (QID) | ORAL | Status: DC | PRN

## 2014-07-05 MED ORDER — DIPHENHYDRAMINE HCL 12.5 MG/5ML PO ELIX
12.5000 mg | ORAL_SOLUTION | Freq: Once | ORAL | Status: AC
  Administered 2014-07-05: 12.5 mg via ORAL
  Filled 2014-07-05: qty 5

## 2014-07-05 MED ORDER — HYDROCODONE-ACETAMINOPHEN 5-325 MG PO TABS
1.0000 | ORAL_TABLET | Freq: Once | ORAL | Status: AC
  Administered 2014-07-05: 1 via ORAL
  Filled 2014-07-05: qty 1

## 2014-07-05 NOTE — ED Provider Notes (Signed)
CSN: [id number]     Arrival date & time 07/05/14  0216 History   First MD Initiated Contact with Patient 07/05/14 [phone]     Chief Complaint  Patient presents with  . Headache     (Consider location/radiation/quality/duration/timing/severity/associated sxs/prior Treatment) HPI Comments: Sit 43 year old female who states for the past.  Today.  She's had a constant global headache despite the use of Tylenol.  She cannot take ibuprofen or any derivatives. As  It causes wheezing.  Denies any fever, URI symptoms, nasal congestion, sinus pressure, visual changes  Patient is a 43 y.o. female presenting with headaches. The history is provided by the patient.  Headache Pain location:  Generalized Radiates to:  Does not radiate Severity currently:  10/10 Severity at highest:  10/10 Timing:  Constant Progression:  Unchanged Chronicity:  New Similar to prior headaches: no   Relieved by:  Nothing Ineffective treatments:  Acetaminophen and resting in a darkened room Associated symptoms: nausea   Associated symptoms: no back pain, no blurred vision, no cough, no diarrhea, no dizziness, no pain, no facial pain, no fatigue, no fever, no hearing loss, no numbness, no paresthesias, no photophobia, no seizures, no sinus pressure, no sore throat, no swollen glands, no URI, no visual change and no vomiting   Nausea:    Severity:  Mild   Onset quality:  Unable to specify   Past Medical History  Diagnosis Date  . Asthma   . Obesity   . Seasonal allergies   . Diverticulitis   . Ovarian cyst   . Infection     trich  . Hypertension    Past Surgical History  Procedure Laterality Date  . Tubal ligation    . Cesarean section    . Forehead reconstruction      as a child   Family History  Problem Relation Age of Onset  . Diabetes Mother   . Heart disease Mother     chf  . Hypertension Father    History  Substance Use Topics  . Smoking status: Current Some Day Smoker -- 0.25 packs/day for 8  years    Types: Cigarettes  . Smokeless tobacco: Never Used     Comment: 3-4 cigs per day  . Alcohol Use: 0.0 oz/week   OB History    Gravida Para Term Preterm AB TAB SAB Ectopic Multiple Living   5 5 4 1  0 0 0 0 0 5     Review of Systems  Constitutional: Negative for fever and fatigue.  HENT: Negative for dental problem, hearing loss, sinus pressure and sore throat.   Eyes: Negative for blurred vision, photophobia, pain, redness and visual disturbance.  Respiratory: Negative for cough and shortness of breath.   Gastrointestinal: Positive for nausea. Negative for vomiting and diarrhea.  Musculoskeletal: Negative for back pain.  Skin: Negative for rash.  Neurological: Positive for headaches. Negative for dizziness, seizures, numbness and paresthesias.  All other systems reviewed and are negative.     Allergies  Ibuprofen; Azithromycin; and Other  Home Medications   Prior to Admission medications   Medication Sig Start Date End Date Taking? Authorizing Provider  hydrochlorothiazide (HYDRODIURIL) 25 MG tablet Take 1 tablet (25 mg total) by mouth daily. 03/08/14  Yes Lorayne Marek, MD  loratadine (CLARITIN) 10 MG tablet Take 1 tablet (10 mg total) by mouth daily. 02/26/14  Yes Tresa Garter, MD  mometasone-formoterol (DULERA) 200-5 MCG/ACT AERO Inhale 2 puffs into the lungs 2 (two) times daily. 03/15/14  Yes  Tresa Garter, MD  acetaminophen (TYLENOL) 500 MG tablet Take 1,000 mg by mouth every 8 (eight) hours as needed for mild pain (pain).     Historical Provider, MD  albuterol (PROVENTIL HFA;VENTOLIN HFA) 108 (90 BASE) MCG/ACT inhaler Inhale 2 puffs into the lungs every 6 (six) hours as needed for wheezing or shortness of breath (wheezing).    Historical Provider, MD  HYDROcodone-acetaminophen (NORCO/VICODIN) 5-325 MG per tablet Take 1 tablet by mouth every 6 (six) hours as needed for moderate pain. 07/05/14   Garald Balding, NP  ondansetron (ZOFRAN) 4 MG tablet Take 1  tablet (4 mg total) by mouth every 6 (six) hours. Patient not taking: Reported on 07/05/2014 04/17/14   Elwyn Lade, PA-C  oxyCODONE-acetaminophen (PERCOCET/ROXICET) 5-325 MG per tablet Take 1-2 tablets by mouth every 6 (six) hours as needed for moderate pain or severe pain. Patient not taking: Reported on 07/05/2014 04/17/14   Elwyn Lade, PA-C   BP 159/95 mmHg  Pulse 79  Temp(Src) 97.7 F (36.5 C) (Oral)  Resp 18  Ht 5\' 2"  (1.575 m)  Wt 230 lb (104.327 kg)  BMI 42.06 kg/m2  SpO2 100%  LMP 06/25/2014 Physical Exam  Constitutional: She is oriented to person, place, and time. She appears well-developed and well-nourished.  HENT:  Head: Normocephalic.  Right Ear: External ear normal.  Left Ear: External ear normal.  Mouth/Throat: Oropharynx is clear and moist.  Eyes: Pupils are equal, round, and reactive to light.  Neck: Normal range of motion.  Cardiovascular: Normal rate and regular rhythm.   Pulmonary/Chest: Effort normal and breath sounds normal.  Musculoskeletal: Normal range of motion. She exhibits no edema or tenderness.  Lymphadenopathy:    She has no cervical adenopathy.  Neurological: She is alert and oriented to person, place, and time.  Skin: Skin is warm and dry.  Nursing note and vitals reviewed.   ED Course  Procedures (including critical care time) Labs Review Labs Reviewed - No data to display  Imaging Review No results found.   EKG Interpretation None      MDM   Final diagnoses:  Acute nonintractable headache, unspecified headache type  Medication refill         Garald Balding, NP 07/05/14 [place], MD 07/05/14 [phone]

## 2014-07-05 NOTE — ED Notes (Signed)
Bed: WLPT1 Expected date:  Expected time:  Means of arrival:  Comments: 

## 2014-07-05 NOTE — ED Notes (Signed)
Pt arrived to the ED with the complaint of a headache.  Pt has had a headache for two days.  Pt states she has not been previously treated for headaches.  Pt took over the counter pain relief pills.Las t dose odf mnedication was "a couple of hours.'

## 2014-07-05 NOTE — ED Notes (Signed)
Bed: TD17 Expected date: 07/05/14 Expected time: 3:31 AM Means of arrival: Ambulance Comments: Leg pain/abrasion

## 2014-07-05 NOTE — Discharge Instructions (Signed)
Make an appointment with your PCP for follow

## 2014-09-09 ENCOUNTER — Emergency Department (HOSPITAL_COMMUNITY): Payer: Self-pay

## 2014-09-09 ENCOUNTER — Encounter (HOSPITAL_COMMUNITY): Payer: Self-pay | Admitting: *Deleted

## 2014-09-09 ENCOUNTER — Emergency Department (HOSPITAL_COMMUNITY)
Admission: EM | Admit: 2014-09-09 | Discharge: 2014-09-09 | Disposition: A | Discharge status: Home / Self Care | Payer: Self-pay | Attending: Emergency Medicine | Admitting: Emergency Medicine

## 2014-09-09 DIAGNOSIS — E669 Obesity, unspecified: Secondary | ICD-10-CM | POA: Insufficient documentation

## 2014-09-09 DIAGNOSIS — J45901 Unspecified asthma with (acute) exacerbation: Secondary | ICD-10-CM | POA: Insufficient documentation

## 2014-09-09 DIAGNOSIS — I10 Essential (primary) hypertension: Secondary | ICD-10-CM | POA: Insufficient documentation

## 2014-09-09 DIAGNOSIS — Z8742 Personal history of other diseases of the female genital tract: Secondary | ICD-10-CM | POA: Insufficient documentation

## 2014-09-09 DIAGNOSIS — Z720 Tobacco use: Secondary | ICD-10-CM | POA: Insufficient documentation

## 2014-09-09 DIAGNOSIS — Z8619 Personal history of other infectious and parasitic diseases: Secondary | ICD-10-CM | POA: Insufficient documentation

## 2014-09-09 DIAGNOSIS — Z79899 Other long term (current) drug therapy: Secondary | ICD-10-CM | POA: Insufficient documentation

## 2014-09-09 DIAGNOSIS — Z8719 Personal history of other diseases of the digestive system: Secondary | ICD-10-CM | POA: Insufficient documentation

## 2014-09-09 MED ORDER — ALBUTEROL SULFATE HFA 108 (90 BASE) MCG/ACT IN AERS: 2.0000 | INHALATION_SPRAY | Freq: Four times a day (QID) | RESPIRATORY_TRACT | Status: DC | PRN

## 2014-09-09 MED ORDER — PREDNISONE 20 MG PO TABS
40.0000 mg | ORAL_TABLET | Freq: Once | ORAL | Status: AC
  Administered 2014-09-09: 40 mg via ORAL
  Filled 2014-09-09: qty 2

## 2014-09-09 MED ORDER — MOMETASONE FURO-FORMOTEROL FUM 200-5 MCG/ACT IN AERO: 2.0000 | INHALATION_SPRAY | Freq: Two times a day (BID) | RESPIRATORY_TRACT | Status: DC

## 2014-09-09 MED ORDER — GUAIFENESIN-CODEINE 100-10 MG/5ML PO SOLN: 10.0000 mL | Freq: Three times a day (TID) | ORAL | Status: DC | PRN

## 2014-09-09 MED ORDER — IPRATROPIUM-ALBUTEROL 0.5-2.5 (3) MG/3ML IN SOLN
3.0000 mL | Freq: Once | RESPIRATORY_TRACT | Status: AC
  Administered 2014-09-09: 3 mL via RESPIRATORY_TRACT
  Filled 2014-09-09: qty 3

## 2014-09-09 MED ORDER — HYDROCODONE-HOMATROPINE 5-1.5 MG/5ML PO SYRP
5.0000 mL | ORAL_SOLUTION | Freq: Once | ORAL | Status: AC
  Administered 2014-09-09: 5 mL via ORAL
  Filled 2014-09-09: qty 5

## 2014-09-09 MED ORDER — PREDNISONE 20 MG PO TABS: 40.0000 mg | ORAL_TABLET | Freq: Every day | ORAL | Status: DC

## 2014-09-09 NOTE — ED Notes (Signed)
Patient states that she has had asthma since birth and feels confident with the instructions provided. She denies any pain or discomfort at this time and is not labored in breathing

## 2014-09-09 NOTE — ED Provider Notes (Signed)
CSN: [id number]     Arrival date & time 09/09/14  0023 History   First MD Initiated Contact with Patient 09/09/14 0240     Chief Complaint  Patient presents with  . Asthma     (Consider location/radiation/quality/duration/timing/severity/associated sxs/prior Treatment) HPI Comments: Patient is a 44 year old female past medical history significant for asthma presenting to the emergency department for asthma exacerbation that began last evening. She states she's had several days of cough with white sputum production, nasal congestion, rhinorrhea. She states her cough worsened last evening triggering her asthma. She states she tried to take her at home rescue inhaler with no improvement. States at night and lying down worsen her cough. She endorses post tussive chest tightness. She's not tried any other medications at home. No modifying factors identified. No history of admissions for asthma exacerbation.   Past Medical History  Diagnosis Date  . Asthma   . Obesity   . Seasonal allergies   . Diverticulitis   . Ovarian cyst   . Infection     trich  . Hypertension    Past Surgical History  Procedure Laterality Date  . Tubal ligation    . Cesarean section    . Forehead reconstruction      as a child   Family History  Problem Relation Age of Onset  . Diabetes Mother   . Heart disease Mother     chf  . Hypertension Father    History  Substance Use Topics  . Smoking status: Current Some Day Smoker -- 0.25 packs/day for 8 years    Types: Cigarettes  . Smokeless tobacco: Never Used     Comment: 3-4 cigs per day  . Alcohol Use: 0.0 oz/week   OB History    Gravida Para Term Preterm AB TAB SAB Ectopic Multiple Living   5 5 4 1  0 0 0 0 0 5     Review of Systems  HENT: Positive for congestion and rhinorrhea.   Respiratory: Positive for cough, chest tightness and wheezing.   All other systems reviewed and are negative.     Allergies  Ibuprofen; Azithromycin; and  Other  Home Medications   Prior to Admission medications   Medication Sig Start Date End Date Taking? Authorizing Provider  acetaminophen (TYLENOL) 500 MG tablet Take 1,000 mg by mouth every 8 (eight) hours as needed for mild pain (pain).    Yes Historical Provider, MD  albuterol (PROVENTIL HFA;VENTOLIN HFA) 108 (90 BASE) MCG/ACT inhaler Inhale 2 puffs into the lungs every 6 (six) hours as needed for wheezing or shortness of breath (wheezing).   Yes Historical Provider, MD  docusate sodium (COLACE) 100 MG capsule Take 100 mg by mouth daily as needed for mild constipation.   Yes Historical Provider, MD  albuterol (PROVENTIL HFA;VENTOLIN HFA) 108 (90 BASE) MCG/ACT inhaler Inhale 2 puffs into the lungs every 6 (six) hours as needed for wheezing or shortness of breath. 09/09/14   Daksh Coates L Aarna Mihalko, PA-C  guaiFENesin-codeine 100-10 MG/5ML syrup Take 10 mLs by mouth 3 (three) times daily as needed for cough. 09/09/14   Sylvana Bonk L Kindsey Eblin, PA-C  hydrochlorothiazide (HYDRODIURIL) 25 MG tablet Take 1 tablet (25 mg total) by mouth daily. Patient not taking: Reported on 09/09/2014 03/08/14   Lorayne Marek, MD  HYDROcodone-acetaminophen (NORCO/VICODIN) 5-325 MG per tablet Take 1 tablet by mouth every 6 (six) hours as needed for moderate pain. Patient not taking: Reported on 09/09/2014 07/05/14   Garald Balding, NP  loratadine (CLARITIN) 10  MG tablet Take 1 tablet (10 mg total) by mouth daily. Patient not taking: Reported on 09/09/2014 02/26/14   Tresa Garter, MD  mometasone-formoterol (DULERA) 200-5 MCG/ACT AERO Inhale 2 puffs into the lungs 2 (two) times daily. 09/09/14   Tannie Koskela L Barclay Lennox, PA-C  ondansetron (ZOFRAN) 4 MG tablet Take 1 tablet (4 mg total) by mouth every 6 (six) hours. Patient not taking: Reported on 07/05/2014 04/17/14   Elwyn Lade, PA-C  oxyCODONE-acetaminophen (PERCOCET/ROXICET) 5-325 MG per tablet Take 1-2 tablets by mouth every 6 (six) hours as needed for moderate pain  or severe pain. Patient not taking: Reported on 07/05/2014 04/17/14   Elwyn Lade, PA-C  predniSONE (DELTASONE) 20 MG tablet Take 2 tablets (40 mg total) by mouth daily. 09/09/14   Rosan Calbert L Amand Lemoine, PA-C   BP 140/92 mmHg  Pulse 83  Temp(Src) 98.2 F (36.8 C) (Oral)  Resp 18  Ht 5\' 2"  (1.575 m)  Wt 230 lb (104.327 kg)  BMI 42.06 kg/m2  SpO2 99%  LMP 09/06/2014 Physical Exam  Constitutional: She is oriented to person, place, and time. She appears well-developed and well-nourished. No distress.  HENT:  Head: Normocephalic and atraumatic.  Right Ear: External ear normal.  Left Ear: External ear normal.  Mouth/Throat: Oropharynx is clear and moist. No oropharyngeal exudate.  Eyes: Conjunctivae are normal.  Neck: Neck supple.  Cardiovascular: Normal rate, regular rhythm and normal heart sounds.   Pulmonary/Chest: Effort normal. No respiratory distress. She has wheezes (inspiratory and expiratory).  Abdominal: Soft. There is no tenderness.  Musculoskeletal: Normal range of motion. She exhibits no edema.  Neurological: She is alert and oriented to person, place, and time.  Skin: Skin is warm and dry. She is not diaphoretic.  Nursing note and vitals reviewed.   ED Course  Procedures (including critical care time) Medications  ipratropium-albuterol (DUONEB) 0.5-2.5 (3) MG/3ML nebulizer solution 3 mL (3 mLs Nebulization Given 09/09/14 0238)  predniSONE (DELTASONE) tablet 40 mg (40 mg Oral Given 09/09/14 0340)  HYDROcodone-homatropine (HYCODAN) 5-1.5 MG/5ML syrup 5 mL (5 mLs Oral Given 09/09/14 0341)  ipratropium-albuterol (DUONEB) 0.5-2.5 (3) MG/3ML nebulizer solution 3 mL (3 mLs Nebulization Given 09/09/14 0341)  ipratropium-albuterol (DUONEB) 0.5-2.5 (3) MG/3ML nebulizer solution 3 mL (3 mLs Nebulization Given 09/09/14 0437)    Labs Review Labs Reviewed - No data to display  Imaging Review Dg Chest 2 View  09/09/2014   CLINICAL DATA:  Acute onset of cough. Chest pain and  shortness of breath. Initial encounter.  EXAM: CHEST  2 VIEW  COMPARISON:  Chest radiograph performed 11/23/2013  FINDINGS: The lungs are well-aerated. Mild right basilar atelectasis or scarring is noted. There is no evidence of pleural effusion or pneumothorax.  The heart is normal in size; the mediastinal contour is within normal limits. No acute osseous abnormalities are seen.  IMPRESSION: Mild right basilar atelectasis or scarring noted; lungs otherwise clear.   Electronically Signed   By: Garald Balding M.D.   On: 09/09/2014 02:01     EKG Interpretation None      MDM   Final diagnoses:  Asthma exacerbation    Filed Vitals:   09/09/14 0330  BP: 140/92  Pulse: 83  Temp:   Resp:    Afebrile, NAD, non-toxic appearing, AAOx4.  I have reviewed nursing notes, vital signs, and all appropriate lab and imaging results for this patient.  Patient ambulated in ED with O2 saturations maintained >90, no current signs of respiratory distress. Lung exam improved after nebulizer  treatment. Prednisone given in the ED and pt will bd dc with 5 day burst. Pt states they are breathing at baseline. Pt has been instructed to continue using prescribed medications and to speak with PCP about today's exacerbation.      Harlow Mares, PA-C 09/09/14 [id number]  Everlene Balls, MD 09/09/14 [phone]

## 2014-09-09 NOTE — Discharge Instructions (Signed)
Please follow up with your primary care physician in 1-2 days. If you do not have one please call the [place] number listed above. Please take all medications as prescribed. Please read all discharge instructions and return precautions.    Asthma Asthma is a recurring condition in which the airways tighten and narrow. Asthma can make it difficult to breathe. It can cause coughing, wheezing, and shortness of breath. Asthma episodes, also called asthma attacks, range from minor to life-threatening. Asthma cannot be cured, but medicines and lifestyle changes can help control it. CAUSES Asthma is believed to be caused by inherited (genetic) and environmental factors, but its exact cause is unknown. Asthma may be triggered by allergens, lung infections, or irritants in the air. Asthma triggers are different for each person. Common triggers include:   Animal dander.  Dust mites.  Cockroaches.  Pollen from trees or grass.  Mold.  Smoke.  Air pollutants such as dust, household cleaners, hair sprays, aerosol sprays, paint fumes, strong chemicals, or strong odors.  Cold air, weather changes, and winds (which increase molds and pollens in the air).  Strong emotional expressions such as crying or laughing hard.  Stress.  Certain medicines (such as aspirin) or types of drugs (such as beta-blockers).  Sulfites in foods and drinks. Foods and drinks that may contain sulfites include dried fruit, potato chips, and sparkling grape juice.  Infections or inflammatory conditions such as the flu, a cold, or an inflammation of the nasal membranes (rhinitis).  Gastroesophageal reflux disease (GERD).  Exercise or strenuous activity. SYMPTOMS Symptoms may occur immediately after asthma is triggered or many hours later. Symptoms include:  Wheezing.  Excessive nighttime or early morning coughing.  Frequent or severe coughing with a common cold.  Chest tightness.  Shortness  of breath. DIAGNOSIS  The diagnosis of asthma is made by a review of your medical history and a physical exam. Tests may also be performed. These may include:  Lung function studies. These tests show how much air you breathe in and out.  Allergy tests.  Imaging tests such as X-rays. TREATMENT  Asthma cannot be cured, but it can usually be controlled. Treatment involves identifying and avoiding your asthma triggers. It also involves medicines. There are 2 classes of medicine used for asthma treatment:   Controller medicines. These prevent asthma symptoms from occurring. They are usually taken every day.  Reliever or rescue medicines. These quickly relieve asthma symptoms. They are used as needed and provide short-term relief. Your health care provider will help you create an asthma action plan. An asthma action plan is a written plan for managing and treating your asthma attacks. It includes a list of your asthma triggers and how they may be avoided. It also includes information on when medicines should be taken and when their dosage should be changed. An action plan may also involve the use of a device called a peak flow meter. A peak flow meter measures how well the lungs are working. It helps you monitor your condition. HOME CARE INSTRUCTIONS   Take medicines only as directed by your health care provider. Speak with your health care provider if you have questions about how or when to take the medicines.  Use a peak flow meter as directed by your health care provider. Record and keep track of readings.  Understand and use the action plan to help minimize or stop an asthma attack without needing to seek medical care.  Control your home environment in  the following ways to help prevent asthma attacks:  Do not smoke. Avoid being exposed to secondhand smoke.  Change your heating and air conditioning filter regularly.  Limit your use of fireplaces and wood stoves.  Get rid of pests (such  as roaches and mice) and their droppings.  Throw away plants if you see mold on them.  Clean your floors and dust regularly. Use unscented cleaning products.  Try to have someone else vacuum for you regularly. Stay out of rooms while they are being vacuumed and for a short while afterward. If you vacuum, use a dust mask from a hardware store, a double-layered or microfilter vacuum cleaner bag, or a vacuum cleaner with a HEPA filter.  Replace carpet with wood, tile, or vinyl flooring. Carpet can trap dander and dust.  Use allergy-proof pillows, mattress covers, and box spring covers.  Wash bed sheets and blankets every week in hot water and dry them in a dryer.  Use blankets that are made of polyester or cotton.  Clean bathrooms and kitchens with bleach. If possible, have someone repaint the walls in these rooms with mold-resistant paint. Keep out of the rooms that are being cleaned and painted.  Wash hands frequently. SEEK MEDICAL CARE IF:   You have wheezing, shortness of breath, or a cough even if taking medicine to prevent attacks.  The colored mucus you cough up (sputum) is thicker than usual.  Your sputum changes from clear or white to yellow, green, gray, or bloody.  You have any problems that may be related to the medicines you are taking (such as a rash, itching, swelling, or trouble breathing).  You are using a reliever medicine more than 2-3 times per week.  Your peak flow is still at 50-79% of your personal best after following your action plan for 1 hour.  You have a fever. SEEK IMMEDIATE MEDICAL CARE IF:   You seem to be getting worse and are unresponsive to treatment during an asthma attack.  You are short of breath even at rest.  You get short of breath when doing very little physical activity.  You have difficulty eating, drinking, or talking due to asthma symptoms.  You develop chest pain.  You develop a fast heartbeat.  You have a bluish color to your  lips or fingernails.  You are light-headed, dizzy, or faint.  Your peak flow is less than 50% of your personal best. MAKE SURE YOU:   Understand these instructions.  Will watch your condition.  Will get help right away if you are not doing well or get worse. Document Released: 06/28/2005 Document Revised: 11/12/2013 Document Reviewed: 01/25/2013 [facility] Patient Information 2015 [place], [place]. This information is not intended to replace advice given to you by your health care provider. Make sure you discuss any questions you have with your health care provider.

## 2014-09-09 NOTE — ED Notes (Signed)
The pt has  Asthma and f= since last pm  She has been coughing when she lies down to sleep and when she coughs she has some chest tightness.  Cough white sputum  No temp.   lmp  2 days ago

## 2014-09-24 ENCOUNTER — Observation Stay (HOSPITAL_COMMUNITY)
Admission: AD | Admit: 2014-09-24 | Discharge: 2014-09-25 | Disposition: A | Discharge status: Home / Self Care | Payer: Self-pay | Source: Ambulatory Visit | Attending: Obstetrics & Gynecology | Admitting: Obstetrics & Gynecology

## 2014-09-24 ENCOUNTER — Inpatient Hospital Stay (HOSPITAL_COMMUNITY): Payer: Self-pay

## 2014-09-24 ENCOUNTER — Encounter (HOSPITAL_COMMUNITY): Payer: Self-pay | Admitting: General Practice

## 2014-09-24 DIAGNOSIS — R1031 Right lower quadrant pain: Principal | ICD-10-CM

## 2014-09-24 DIAGNOSIS — R1032 Left lower quadrant pain: Secondary | ICD-10-CM | POA: Insufficient documentation

## 2014-09-24 DIAGNOSIS — N920 Excessive and frequent menstruation with regular cycle: Secondary | ICD-10-CM | POA: Diagnosis present

## 2014-09-24 DIAGNOSIS — N921 Excessive and frequent menstruation with irregular cycle: Secondary | ICD-10-CM

## 2014-09-24 DIAGNOSIS — Z3202 Encounter for pregnancy test, result negative: Secondary | ICD-10-CM | POA: Insufficient documentation

## 2014-09-24 DIAGNOSIS — D62 Acute posthemorrhagic anemia: Secondary | ICD-10-CM

## 2014-09-24 DIAGNOSIS — E669 Obesity, unspecified: Secondary | ICD-10-CM | POA: Insufficient documentation

## 2014-09-24 DIAGNOSIS — Z79899 Other long term (current) drug therapy: Secondary | ICD-10-CM | POA: Insufficient documentation

## 2014-09-24 DIAGNOSIS — J45909 Unspecified asthma, uncomplicated: Secondary | ICD-10-CM | POA: Insufficient documentation

## 2014-09-24 DIAGNOSIS — Z720 Tobacco use: Secondary | ICD-10-CM | POA: Insufficient documentation

## 2014-09-24 DIAGNOSIS — Z8619 Personal history of other infectious and parasitic diseases: Secondary | ICD-10-CM | POA: Insufficient documentation

## 2014-09-24 DIAGNOSIS — Z8719 Personal history of other diseases of the digestive system: Secondary | ICD-10-CM | POA: Insufficient documentation

## 2014-09-24 DIAGNOSIS — Z7951 Long term (current) use of inhaled steroids: Secondary | ICD-10-CM | POA: Insufficient documentation

## 2014-09-24 DIAGNOSIS — D279 Benign neoplasm of unspecified ovary: Secondary | ICD-10-CM | POA: Diagnosis present

## 2014-09-24 DIAGNOSIS — N946 Dysmenorrhea, unspecified: Secondary | ICD-10-CM

## 2014-09-24 DIAGNOSIS — I10 Essential (primary) hypertension: Secondary | ICD-10-CM | POA: Insufficient documentation

## 2014-09-24 DIAGNOSIS — D270 Benign neoplasm of right ovary: Secondary | ICD-10-CM

## 2014-09-24 DIAGNOSIS — D591 Other autoimmune hemolytic anemias: Secondary | ICD-10-CM | POA: Insufficient documentation

## 2014-09-24 LAB — CBC
HCT: 20.5 % — ABNORMAL LOW (ref 36.0–46.0)
Hemoglobin: 6 g/dL — CL (ref 12.0–15.0)
MCH: 18.3 pg — AB (ref 26.0–34.0)
MCHC: 29.3 g/dL — AB (ref 30.0–36.0)
MCV: 62.5 fL — AB (ref 78.0–100.0)
PLATELETS: 338 10*3/uL (ref 150–400)
RBC: 3.28 MIL/uL — ABNORMAL LOW (ref 3.87–5.11)
RDW: 22.4 % — AB (ref 11.5–15.5)
WBC: 15.6 10*3/uL — ABNORMAL HIGH (ref 4.0–10.5)

## 2014-09-24 LAB — URINALYSIS, ROUTINE W REFLEX MICROSCOPIC
Bilirubin Urine: NEGATIVE
Glucose, UA: NEGATIVE mg/dL
Ketones, ur: NEGATIVE mg/dL
Nitrite: NEGATIVE
PROTEIN: NEGATIVE mg/dL
Specific Gravity, Urine: 1.02 (ref 1.005–1.030)
Urobilinogen, UA: 1 mg/dL (ref 0.0–1.0)
pH: 8 (ref 5.0–8.0)

## 2014-09-24 LAB — URINE MICROSCOPIC-ADD ON

## 2014-09-24 LAB — POCT PREGNANCY, URINE: Preg Test, Ur: NEGATIVE

## 2014-09-24 MED ORDER — MEGESTROL ACETATE 40 MG PO TABS
40.0000 mg | ORAL_TABLET | Freq: Three times a day (TID) | ORAL | Status: DC
  Administered 2014-09-25: 40 mg via ORAL
  Filled 2014-09-24: qty 1

## 2014-09-24 MED ORDER — SODIUM CHLORIDE 0.9 % IV SOLN
Freq: Once | INTRAVENOUS | Status: AC
  Administered 2014-09-25: 03:00:00 via INTRAVENOUS

## 2014-09-24 MED ORDER — MEGESTROL ACETATE 40 MG PO TABS
40.0000 mg | ORAL_TABLET | Freq: Once | ORAL | Status: AC
  Administered 2014-09-24: 40 mg via ORAL
  Filled 2014-09-24: qty 1

## 2014-09-24 MED ORDER — SODIUM CHLORIDE 0.9 % IV SOLN
INTRAVENOUS | Status: DC
  Administered 2014-09-24: 23:00:00 via INTRAVENOUS

## 2014-09-24 MED ORDER — SIMETHICONE 80 MG PO CHEW
80.0000 mg | CHEWABLE_TABLET | Freq: Four times a day (QID) | ORAL | Status: DC | PRN
Start: 2014-09-24 — End: 2014-09-25

## 2014-09-24 MED ORDER — DIPHENHYDRAMINE HCL 25 MG PO CAPS
25.0000 mg | ORAL_CAPSULE | Freq: Once | ORAL | Status: AC
  Administered 2014-09-25: 25 mg via ORAL
  Filled 2014-09-24: qty 1

## 2014-09-24 MED ORDER — SODIUM CHLORIDE 0.9 % IV SOLN: INTRAVENOUS | Status: DC

## 2014-09-24 MED ORDER — OXYCODONE-ACETAMINOPHEN 5-325 MG PO TABS
2.0000 | ORAL_TABLET | Freq: Once | ORAL | Status: AC
  Administered 2014-09-24: 2 via ORAL
  Filled 2014-09-24: qty 2

## 2014-09-24 MED ORDER — OXYCODONE-ACETAMINOPHEN 5-325 MG PO TABS
1.0000 | ORAL_TABLET | ORAL | Status: DC | PRN
  Administered 2014-09-25: 2 via ORAL
  Administered 2014-09-25: 1 via ORAL
  Filled 2014-09-24: qty 1
  Filled 2014-09-24: qty 2

## 2014-09-24 NOTE — MAU Provider Note (Signed)
Chief Complaint: Abdominal Pain and Vaginal Bleeding  First Provider Initiated Contact with Patient 09/24/14 2250     SUBJECTIVE HPI: Anne Schaefer is a 44 y.o. [phone] female who presents with heavy vaginal bleeding, passing large clots since 09/18/2014. 2 weeks earlier than normal menstrual period Should have started. Bleeding has lightened up today. No history of heavy or irregular periods. Also reports low abdominal cramping 9/10 on pain scale 3 days, dizziness and one episode of feeling as if she might pass out today.  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  Past Medical History  Diagnosis Date  . Asthma   . Obesity   . Seasonal allergies   . Diverticulitis   . Ovarian cyst   . Infection     trich  . Hypertension    OB History  Gravida Para Term Preterm AB SAB TAB Ectopic Multiple Living  5 5 4 1  0 0 0 0 0 5    # Outcome Date GA Lbr Len/2nd Weight Sex Delivery Anes PTL Lv  5 Preterm 07/06/92     CS-LTranv   Y     Comments: abruption  4 Term      Vag-Spont  N   3 Term      Vag-Spont  N   2 Term      Vag-Spont  N   1 Term      Vag-Spont        Past Surgical History  Procedure Laterality Date  . Tubal ligation    . Cesarean section    . Forehead reconstruction      as a child   History   Social History  . Marital Status: Single    Spouse Name: N/A  . Number of Children: N/A  . Years of Education: N/A   Occupational History  . Not on file.   Social History Main Topics  . Smoking status: Current Some Day Smoker -- 0.25 packs/day for 8 years    Types: Cigarettes  . Smokeless tobacco: Never Used     Comment: 3-4 cigs per day  . Alcohol Use: 0.0 oz/week  . Drug Use: Yes    Special: Marijuana     Comment: occasionally  . Sexual Activity: Yes    Birth Control/ Protection: Surgical    Other Topics Concern  . Not on file   Social History Narrative   No current facility-administered medications on file prior to encounter.   Current Outpatient Prescriptions on File Prior to Encounter  Medication Sig Dispense Refill  . acetaminophen (TYLENOL) 500 MG tablet Take 1,000 mg by mouth every 8 (eight) hours as needed for mild pain (pain).     Marland Kitchen albuterol (PROVENTIL HFA;VENTOLIN HFA) 108 (90 BASE) MCG/ACT inhaler Inhale 2 puffs into the lungs every 6 (six) hours as needed for wheezing or shortness of breath. 1 Inhaler 2  . hydrochlorothiazide (HYDRODIURIL) 25 MG tablet Take 1 tablet (25 mg total) by mouth daily. 90 tablet 3  . mometasone-formoterol (DULERA) 200-5 MCG/ACT AERO Inhale 2 puffs into the lungs 2 (two) times daily. 1 Inhaler 0  . guaiFENesin-codeine 100-10 MG/5ML syrup Take 10 mLs by mouth 3 (three) times daily as needed for cough. (Patient not taking: Reported on 09/24/2014) 120 mL 0  . HYDROcodone-acetaminophen (NORCO/VICODIN) 5-325 MG per tablet Take 1 tablet  by mouth every 6 (six) hours as needed for moderate pain. (Patient not taking: Reported on 09/09/2014) 10 tablet 0  . loratadine (CLARITIN) 10 MG tablet Take 1 tablet (10 mg total) by mouth daily. (Patient not taking: Reported on 09/09/2014) 30 tablet 0  . ondansetron (ZOFRAN) 4 MG tablet Take 1 tablet (4 mg total) by mouth every 6 (six) hours. (Patient not taking: Reported on 07/05/2014) 12 tablet 0  . oxyCODONE-acetaminophen (PERCOCET/ROXICET) 5-325 MG per tablet Take 1-2 tablets by mouth every 6 (six) hours as needed for moderate pain or severe pain. (Patient not taking: Reported on 07/05/2014) 15 tablet 0  . predniSONE (DELTASONE) 20 MG tablet Take 2 tablets (40 mg total) by mouth daily. (Patient not taking: Reported on 09/24/2014) 10 tablet 0   Allergies  Allergen Reactions  . Ibuprofen Shortness Of Breath  . Azithromycin Hives and Rash  . Other Itching and Rash    "Worth"    Review of Systems   Constitutional: Negative for fever and chills.  HENT: Negative for nosebleeds.   Gastrointestinal: Positive for abdominal pain. Negative for nausea, vomiting, diarrhea and constipation.  Genitourinary: Negative for dysuria, urgency, frequency, hematuria and flank pain.  Neurological: Positive for dizziness and weakness.  Endo/Heme/Allergies: Does not bruise/bleed easily.   OBJECTIVE Blood pressure 143/93, pulse 106, temperature 98.7 F (37.1 C), temperature source Oral, resp. rate 18, height 5\' 2"  (1.575 m), weight 104.327 kg (230 lb), last menstrual period 09/18/2014, SpO2 100 %. GENERAL: Well-developed, well-nourished female in moderate distress.  HEENT: Normocephalic HEART: Tachycardic.  RESP: normal effort ABDOMEN: Soft, moderate suprapubic tenderness. Positive bowel sounds 4. No masses. Negative CVA tenderness. EXTREMITIES: Nontender, no edema NEURO: Alert and oriented SPECULUM EXAM: Transfer to women's unit before CNM performed speculum exam. Moderate amount of blood on pad per RN.  LAB RESULTS Results for orders placed or performed during the hospital encounter of 09/24/14 (from the past 24 hour(s))  Urinalysis, Routine w reflex microscopic     Status: Abnormal   Collection Time: 09/24/14  6:55 PM  Result Value Ref Range   Color, Urine YELLOW YELLOW   APPearance CLOUDY (A) CLEAR   Specific Gravity, Urine 1.020 1.005 - 1.030   pH 8.0 5.0 - 8.0   Glucose, UA NEGATIVE NEGATIVE mg/dL   Hgb urine dipstick LARGE (A) NEGATIVE   Bilirubin Urine NEGATIVE NEGATIVE   Ketones, ur NEGATIVE NEGATIVE mg/dL   Protein, ur NEGATIVE NEGATIVE mg/dL   Urobilinogen, UA 1.0 0.0 - 1.0 mg/dL   Nitrite NEGATIVE NEGATIVE   Leukocytes, UA TRACE (A) NEGATIVE  Urine microscopic-add on     Status: None   Collection Time: 09/24/14  6:55 PM  Result Value Ref Range   Squamous Epithelial / LPF RARE RARE   WBC, UA 0-2 <3 WBC/hpf   RBC / HPF TOO NUMEROUS TO COUNT <3 RBC/hpf   Bacteria, UA RARE RARE   Pregnancy, urine POC     Status: None   Collection Time: 09/24/14  7:12 PM  Result Value Ref Range   Preg Test, Ur NEGATIVE NEGATIVE  CBC     Status: Abnormal   Collection Time: 09/24/14  8:50 PM  Result Value Ref Range   WBC 15.6 (H) 4.0 - 10.5 K/uL   RBC 3.28 (L) 3.87 - 5.11 MIL/uL   Hemoglobin 6.0 (LL) 12.0 - 15.0 g/dL   HCT 20.5 (L) 36.0 - 46.0 %   MCV 62.5 (L) 78.0 - 100.0 fL   MCH 18.3 (L) 26.0 -  34.0 pg   MCHC 29.3 (L) 30.0 - 36.0 g/dL   RDW 22.4 (H) 11.5 - 15.5 %   Platelets 338 150 - 400 K/uL  Prepare RBC     Status: None   Collection Time: 09/24/14 11:03 PM  Result Value Ref Range   Order Confirmation ORDER PROCESSED BY BLOOD BANK   Type and screen     Status: None (Preliminary result)   Collection Time: 09/24/14 11:05 PM  Result Value Ref Range   ABO/RH(D) AB POS    Antibody Screen NEG    Sample Expiration 09/27/2014    Unit Number [id number]    Blood Component Type RED CELLS,LR    Unit division 00    Status of Unit ALLOCATED    Transfusion Status OK TO TRANSFUSE    Crossmatch Result Compatible    Unit Number [id number]    Blood Component Type RED CELLS,LR    Unit division 00    Status of Unit ALLOCATED    Transfusion Status OK TO TRANSFUSE    Crossmatch Result Compatible    Unit Number [id number]    Blood Component Type RED CELLS,LR    Unit division 00    Status of Unit ISSUED    Transfusion Status OK TO TRANSFUSE    Crossmatch Result Compatible   ABO/Rh     Status: None   Collection Time: 09/25/14 11:05 PM  Result Value Ref Range   ABO/RH(D) AB POS     IMAGING US Transvaginal Non-ob  09/24/2014   CLINICAL DATA:  Bilateral pelvic pain. Menorrhagia. Known right ovarian dermoid.  EXAM: TRANSABDOMINAL AND TRANSVAGINAL ULTRASOUND OF PELVIS  DOPPLER ULTRASOUND OF OVARIES  TECHNIQUE: Both transabdominal and transvaginal ultrasound examinations of the pelvis were performed. Transabdominal technique was performed for global imaging of the pelvis  including uterus, ovaries, adnexal regions, and pelvic cul-de-sac.  It was necessary to proceed with endovaginal exam following the transabdominal exam to visualize the uterus and ovaries. Color and duplex Doppler ultrasound was utilized to evaluate blood flow to the ovaries.  COMPARISON:  04/17/2014  FINDINGS: Uterus  Measurements: 7.3 x 7.9 x 12.1 cm. There is an intramural fibroid measuring 3.6 cm, unchanged from 04/17/2014.  Endometrium  Thickness: 7 .4 mm. There is slight displacement of the endometrium by the fibroid.  Right ovary  Measurements: The known right ovarian dermoid measures 4.6 x 5.0 x 7.0 cm. No normal appearing right ovarian parenchyma is visible. There is a paraovarian cyst on the right measuring up to 1.6 cm.  Left ovary  Measurements: 2.4 x 1.9 x 4.7 cm. There are 2 simple left ovarian cysts, measuring up to 2.3 cm.  Pulsed Doppler evaluation of both ovaries demonstrates normal low-resistance arterial and venous waveforms for the left ovary. There was a sub optimal vascular assessment of the right ovary, as no normal right ovarian parenchyma could be observed.  Other findings  No free fluid.  IMPRESSION: 1. 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 No normal right ovarian parenchyma visible. Nondiagnostic vascular evaluation of the right ovary. 2. Intramural uterine fibroid measuring up to 3.6 cm 3. Normal left ovary with normal perfusion on Doppler.   Electronically Signed   By: Andreas Newport M.D.   On: 09/24/2014 22:09   US Pelvis Complete  09/24/2014   CLINICAL DATA:  Bilateral pelvic pain. Menorrhagia. Known right ovarian dermoid.  EXAM: TRANSABDOMINAL AND TRANSVAGINAL ULTRASOUND OF PELVIS  DOPPLER ULTRASOUND OF OVARIES  TECHNIQUE: Both transabdominal and transvaginal ultrasound examinations of the  pelvis were performed. Transabdominal technique was performed for global imaging of the pelvis including uterus, ovaries, adnexal regions, and pelvic cul-de-sac.  It was necessary to  proceed with endovaginal exam following the transabdominal exam to visualize the uterus and ovaries. Color and duplex Doppler ultrasound was utilized to evaluate blood flow to the ovaries.  COMPARISON:  04/17/2014  FINDINGS: Uterus  Measurements: 7.3 x 7.9 x 12.1 cm. There is an intramural fibroid measuring 3.6 cm, unchanged from 04/17/2014.  Endometrium  Thickness: 7 .4 mm. There is slight displacement of the endometrium by the fibroid.  Right ovary  Measurements: The known right ovarian dermoid measures 4.6 x 5.0 x 7.0 cm. No normal appearing right ovarian parenchyma is visible. There is a paraovarian cyst on the right measuring up to 1.6 cm.  Left ovary  Measurements: 2.4 x 1.9 x 4.7 cm. There are 2 simple left ovarian cysts, measuring up to 2.3 cm.  Pulsed Doppler evaluation of both ovaries demonstrates normal low-resistance arterial and venous waveforms for the left ovary. There was a sub optimal vascular assessment of the right ovary, as no normal right ovarian parenchyma could be observed.  Other findings  No free fluid.  IMPRESSION: 1. 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 No normal right ovarian parenchyma visible. Nondiagnostic vascular evaluation of the right ovary. 2. Intramural uterine fibroid measuring up to 3.6 cm 3. Normal left ovary with normal perfusion on Doppler.   Electronically Signed   By: Andreas Newport M.D.   On: 09/24/2014 22:09   [place] Art/ven Flow Abd Pelv Doppler  09/24/2014   CLINICAL DATA:  Bilateral pelvic pain. Menorrhagia. Known right ovarian dermoid.  EXAM: TRANSABDOMINAL AND TRANSVAGINAL ULTRASOUND OF PELVIS  DOPPLER ULTRASOUND OF OVARIES  TECHNIQUE: Both transabdominal and transvaginal ultrasound examinations of the pelvis were performed. Transabdominal technique was performed for global imaging of the pelvis including uterus, ovaries, adnexal regions, and pelvic cul-de-sac.  It was necessary to proceed with endovaginal exam following the transabdominal exam to visualize  the uterus and ovaries. Color and duplex Doppler ultrasound was utilized to evaluate blood flow to the ovaries.  COMPARISON:  04/17/2014  FINDINGS: Uterus  Measurements: 7.3 x 7.9 x 12.1 cm. There is an intramural fibroid measuring 3.6 cm, unchanged from 04/17/2014.  Endometrium  Thickness: 7 .4 mm. There is slight displacement of the endometrium by the fibroid.  Right ovary  Measurements: The known right ovarian dermoid measures 4.6 x 5.0 x 7.0 cm. No normal appearing right ovarian parenchyma is visible. There is a paraovarian cyst on the right measuring up to 1.6 cm.  Left ovary  Measurements: 2.4 x 1.9 x 4.7 cm. There are 2 simple left ovarian cysts, measuring up to 2.3 cm.  Pulsed Doppler evaluation of both ovaries demonstrates normal low-resistance arterial and venous waveforms for the left ovary. There was a sub optimal vascular assessment of the right ovary, as no normal right ovarian parenchyma could be observed.  Other findings  No free fluid.  IMPRESSION: 1. 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 No normal right ovarian parenchyma visible. Nondiagnostic vascular evaluation of the right ovary. 2. Intramural uterine fibroid measuring up to 3.6 cm 3. Normal left ovary with normal perfusion on Doppler.   Electronically Signed   By: Andreas Newport M.D.   On: 09/24/2014 22:09   MAU COURSE Pain improved significantly with Percocet. Dr. Gala Romney at bedside discussing severe anemia, ultrasound results and that patient will need to have surgery to remove the dermoid  cyst. Although there was nondiagnostic vascular evaluation of right ovary on ultrasound Dr. Gala Romney has low suspicion for ovarian torsion. No indication for emergency surgery at this time.  ASSESSMENT 1. Abdominal pain, acute, bilateral lower quadrant   2. Menorrhagia with irregular cycle   3. Dermoid cyst of right ovary    PLAN Observe on women's unit.  Blood transfusion Megace  Manya Silvas, [place] 09/24/2014  8:32 PM

## 2014-09-24 NOTE — Progress Notes (Signed)
CRITICAL VALUE ALERT  Critical value received:hemoglobin 6.0 Date of notification: 09/24/2014  Time of notification: 2107  Critical value read back: yes  Nurse who received alert: Alyshia Kernan, rn  MD notified (1st page): Manya Silvas, CNM  Time of first page: 2107  MD notified (2nd page): N/A  Time of second page: N/A  Responding MD: Manya Silvas, CNM  Time MD responded: 2107

## 2014-09-24 NOTE — MAU Note (Signed)
Pt states her period stopped on 02/27, started bleeding again on 03/09. States she is passing clots and having abd pain off/on for the last 3 day but states it is ongoing.

## 2014-09-25 DIAGNOSIS — D271 Benign neoplasm of left ovary: Secondary | ICD-10-CM

## 2014-09-25 DIAGNOSIS — D500 Iron deficiency anemia secondary to blood loss (chronic): Secondary | ICD-10-CM

## 2014-09-25 DIAGNOSIS — N920 Excessive and frequent menstruation with regular cycle: Secondary | ICD-10-CM

## 2014-09-25 LAB — CBC
HEMATOCRIT: 27.3 % — AB (ref 36.0–46.0)
HEMOGLOBIN: 8.6 g/dL — AB (ref 12.0–15.0)
MCH: 21.6 pg — ABNORMAL LOW (ref 26.0–34.0)
MCHC: 31.1 g/dL (ref 30.0–36.0)
MCV: 69.3 fL — AB (ref 78.0–100.0)
Platelets: 270 10*3/uL (ref 150–400)
RBC: 3.93 MIL/uL (ref 3.87–5.11)
RDW: 23.7 % — ABNORMAL HIGH (ref 11.5–15.5)
WBC: 11 10*3/uL — AB (ref 4.0–10.5)

## 2014-09-25 LAB — ABO/RH: ABO/RH(D): AB POS

## 2014-09-25 LAB — PREPARE RBC (CROSSMATCH)

## 2014-09-25 MED ORDER — HYDROCHLOROTHIAZIDE 25 MG PO TABS
25.0000 mg | ORAL_TABLET | Freq: Every day | ORAL | Status: DC
  Administered 2014-09-25: 25 mg via ORAL
  Filled 2014-09-25: qty 1

## 2014-09-25 MED ORDER — MEGESTROL ACETATE 40 MG PO TABS: ORAL_TABLET | ORAL | Status: DC

## 2014-09-25 NOTE — H&P (Signed)
Chief Complaint: Abdominal Pain and Vaginal Bleeding  First Provider Initiated Contact with Patient 09/24/14 2250     SUBJECTIVE HPI: Anne Schaefer is a 44 y.o. [phone] female who presents with heavy vaginal bleeding, passing large clots since 09/18/2014. 2 weeks earlier than normal menstrual period Should have started. Bleeding has lightened up today. No history of heavy or irregular periods. Also reports low abdominal cramping 9/10 on pain scale 3 days, dizziness and one episode of feeling as if she might pass out today.  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  Past Medical History  Diagnosis Date  . Asthma   . Obesity   . Seasonal allergies   . Diverticulitis   . Ovarian cyst   . Infection     trich  . Hypertension    OB History  Gravida Para Term Preterm AB SAB TAB Ectopic Multiple Living  5 5 4 1  0 0 0 0 0 5    # Outcome Date GA Lbr Len/2nd Weight Sex Delivery Anes PTL Lv  5 Preterm 07/06/92     CS-LTranv   Y     Comments: abruption  4 Term      Vag-Spont  N   3 Term      Vag-Spont  N   2 Term      Vag-Spont  N   1 Term      Vag-Spont        Past Surgical History  Procedure Laterality Date  . Tubal ligation    . Cesarean section    . Forehead reconstruction      as a child   History   Social History  . Marital Status: Single    Spouse Name: N/A  . Number of Children: N/A  . Years of Education: N/A   Occupational History  . Not on file.   Social History Main Topics  . Smoking status: Current Some Day Smoker -- 0.25 packs/day for 8 years    Types: Cigarettes  . Smokeless tobacco: Never Used     Comment: 3-4 cigs per day  . Alcohol Use: 0.0 oz/week  . Drug Use: Yes    Special: Marijuana     Comment: occasionally  . Sexual Activity: Yes    Birth Control/ Protection: Surgical    Other Topics Concern  . Not on file   Social History Narrative   No current facility-administered medications on file prior to encounter.   Current Outpatient Prescriptions on File Prior to Encounter  Medication Sig Dispense Refill  . acetaminophen (TYLENOL) 500 MG tablet Take 1,000 mg by mouth every 8 (eight) hours as needed for mild pain (pain).     Marland Kitchen albuterol (PROVENTIL HFA;VENTOLIN HFA) 108 (90 BASE) MCG/ACT inhaler Inhale 2 puffs into the lungs every 6 (six) hours as needed for wheezing or shortness of breath. 1 Inhaler 2  . hydrochlorothiazide (HYDRODIURIL) 25 MG tablet Take 1 tablet (25 mg total) by mouth daily. 90 tablet 3  . mometasone-formoterol (DULERA) 200-5 MCG/ACT AERO Inhale 2 puffs into the lungs 2 (two) times daily. 1 Inhaler 0  . guaiFENesin-codeine 100-10 MG/5ML syrup Take 10 mLs by mouth 3 (three) times daily as needed for cough. (Patient not taking: Reported on 09/24/2014) 120 mL 0  . HYDROcodone-acetaminophen (NORCO/VICODIN) 5-325 MG per tablet Take 1 tablet  by mouth every 6 (six) hours as needed for moderate pain. (Patient not taking: Reported on 09/09/2014) 10 tablet 0  . loratadine (CLARITIN) 10 MG tablet Take 1 tablet (10 mg total) by mouth daily. (Patient not taking: Reported on 09/09/2014) 30 tablet 0  . ondansetron (ZOFRAN) 4 MG tablet Take 1 tablet (4 mg total) by mouth every 6 (six) hours. (Patient not taking: Reported on 07/05/2014) 12 tablet 0  . oxyCODONE-acetaminophen (PERCOCET/ROXICET) 5-325 MG per tablet Take 1-2 tablets by mouth every 6 (six) hours as needed for moderate pain or severe pain. (Patient not taking: Reported on 07/05/2014) 15 tablet 0  . predniSONE (DELTASONE) 20 MG tablet Take 2 tablets (40 mg total) by mouth daily. (Patient not taking: Reported on 09/24/2014) 10 tablet 0   Allergies  Allergen Reactions  . Ibuprofen Shortness Of Breath  . Azithromycin Hives and Rash  . Other Itching and Rash    "[place]"    Review of Systems   Constitutional: Negative for fever and chills.  HENT: Negative for nosebleeds.   Gastrointestinal: Positive for abdominal pain. Negative for nausea, vomiting, diarrhea and constipation.  Genitourinary: Negative for dysuria, urgency, frequency, hematuria and flank pain.  Neurological: Positive for dizziness and weakness.  Endo/Heme/Allergies: Does not bruise/bleed easily.   OBJECTIVE Blood pressure 143/93, pulse 106, temperature 98.7 F (37.1 C), temperature source Oral, resp. rate 18, height 5\' 2"  (1.575 m), weight 104.327 kg (230 lb), last menstrual period 09/18/2014, SpO2 100 %. GENERAL: Well-developed, well-nourished female in moderate distress.  HEENT: Normocephalic HEART: Tachycardic.  RESP: normal effort ABDOMEN: Soft, moderate suprapubic tenderness. Positive bowel sounds 4. No masses. Negative CVA tenderness. EXTREMITIES: Nontender, no edema NEURO: Alert and oriented SPECULUM EXAM: Transfer to women's unit before CNM performed speculum exam. Moderate amount of blood on pad per RN.  LAB RESULTS Results for orders placed or performed during the hospital encounter of 09/24/14 (from the past 24 hour(s))  Urinalysis, Routine w reflex microscopic     Status: Abnormal   Collection Time: 09/24/14  6:55 PM  Result Value Ref Range   Color, Urine YELLOW YELLOW   APPearance CLOUDY (A) CLEAR   Specific Gravity, Urine 1.020 1.005 - 1.030   pH 8.0 5.0 - 8.0   Glucose, UA NEGATIVE NEGATIVE mg/dL   Hgb urine dipstick LARGE (A) NEGATIVE   Bilirubin Urine NEGATIVE NEGATIVE   Ketones, ur NEGATIVE NEGATIVE mg/dL   Protein, ur NEGATIVE NEGATIVE mg/dL   Urobilinogen, UA 1.0 0.0 - 1.0 mg/dL   Nitrite NEGATIVE NEGATIVE   Leukocytes, UA TRACE (A) NEGATIVE  Urine microscopic-add on     Status: None   Collection Time: 09/24/14  6:55 PM  Result Value Ref Range   Squamous Epithelial / LPF RARE RARE   WBC, UA 0-2 <3 WBC/hpf   RBC / HPF TOO NUMEROUS TO COUNT <3 RBC/hpf   Bacteria, UA RARE RARE   Pregnancy, urine POC     Status: None   Collection Time: 09/24/14  7:12 PM  Result Value Ref Range   Preg Test, Ur NEGATIVE NEGATIVE  CBC     Status: Abnormal   Collection Time: 09/24/14  8:50 PM  Result Value Ref Range   WBC 15.6 (H) 4.0 - 10.5 K/uL   RBC 3.28 (L) 3.87 - 5.11 MIL/uL   Hemoglobin 6.0 (LL) 12.0 - 15.0 g/dL   HCT 20.5 (L) 36.0 - 46.0 %   MCV 62.5 (L) 78.0 - 100.0 fL   MCH 18.3 (L) 26.0 -  34.0 pg   MCHC 29.3 (L) 30.0 - 36.0 g/dL   RDW 22.4 (H) 11.5 - 15.5 %   Platelets 338 150 - 400 K/uL  Prepare RBC     Status: None   Collection Time: 09/24/14 11:03 PM  Result Value Ref Range   Order Confirmation ORDER PROCESSED BY BLOOD BANK   Type and screen     Status: None (Preliminary result)   Collection Time: 09/24/14 11:05 PM  Result Value Ref Range   ABO/RH(D) AB POS    Antibody Screen NEG    Sample Expiration 09/27/2014    Unit Number [id number]    Blood Component Type RED CELLS,LR    Unit division 00    Status of Unit ALLOCATED    Transfusion Status OK TO TRANSFUSE    Crossmatch Result Compatible    Unit Number [id number]    Blood Component Type RED CELLS,LR    Unit division 00    Status of Unit ALLOCATED    Transfusion Status OK TO TRANSFUSE    Crossmatch Result Compatible    Unit Number [id number]    Blood Component Type RED CELLS,LR    Unit division 00    Status of Unit ISSUED    Transfusion Status OK TO TRANSFUSE    Crossmatch Result Compatible   ABO/Rh     Status: None   Collection Time: 09/25/14 11:05 PM  Result Value Ref Range   ABO/RH(D) AB POS     IMAGING US Transvaginal Non-ob  09/24/2014   CLINICAL DATA:  Bilateral pelvic pain. Menorrhagia. Known right ovarian dermoid.  EXAM: TRANSABDOMINAL AND TRANSVAGINAL ULTRASOUND OF PELVIS  DOPPLER ULTRASOUND OF OVARIES  TECHNIQUE: Both transabdominal and transvaginal ultrasound examinations of the pelvis were performed. Transabdominal technique was performed for global imaging of the pelvis  including uterus, ovaries, adnexal regions, and pelvic cul-de-sac.  It was necessary to proceed with endovaginal exam following the transabdominal exam to visualize the uterus and ovaries. Color and duplex Doppler ultrasound was utilized to evaluate blood flow to the ovaries.  COMPARISON:  04/17/2014  FINDINGS: Uterus  Measurements: 7.3 x 7.9 x 12.1 cm. There is an intramural fibroid measuring 3.6 cm, unchanged from 04/17/2014.  Endometrium  Thickness: 7 .4 mm. There is slight displacement of the endometrium by the fibroid.  Right ovary  Measurements: The known right ovarian dermoid measures 4.6 x 5.0 x 7.0 cm. No normal appearing right ovarian parenchyma is visible. There is a paraovarian cyst on the right measuring up to 1.6 cm.  Left ovary  Measurements: 2.4 x 1.9 x 4.7 cm. There are 2 simple left ovarian cysts, measuring up to 2.3 cm.  Pulsed Doppler evaluation of both ovaries demonstrates normal low-resistance arterial and venous waveforms for the left ovary. There was a sub optimal vascular assessment of the right ovary, as no normal right ovarian parenchyma could be observed.  Other findings  No free fluid.  IMPRESSION: 1. 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 No normal right ovarian parenchyma visible. Nondiagnostic vascular evaluation of the right ovary. 2. Intramural uterine fibroid measuring up to 3.6 cm 3. Normal left ovary with normal perfusion on Doppler.   Electronically Signed   By: Andreas Newport M.D.   On: 09/24/2014 22:09   US Pelvis Complete  09/24/2014   CLINICAL DATA:  Bilateral pelvic pain. Menorrhagia. Known right ovarian dermoid.  EXAM: TRANSABDOMINAL AND TRANSVAGINAL ULTRASOUND OF PELVIS  DOPPLER ULTRASOUND OF OVARIES  TECHNIQUE: Both transabdominal and transvaginal ultrasound examinations of the  pelvis were performed. Transabdominal technique was performed for global imaging of the pelvis including uterus, ovaries, adnexal regions, and pelvic cul-de-sac.  It was necessary to  proceed with endovaginal exam following the transabdominal exam to visualize the uterus and ovaries. Color and duplex Doppler ultrasound was utilized to evaluate blood flow to the ovaries.  COMPARISON:  04/17/2014  FINDINGS: Uterus  Measurements: 7.3 x 7.9 x 12.1 cm. There is an intramural fibroid measuring 3.6 cm, unchanged from 04/17/2014.  Endometrium  Thickness: 7 .4 mm. There is slight displacement of the endometrium by the fibroid.  Right ovary  Measurements: The known right ovarian dermoid measures 4.6 x 5.0 x 7.0 cm. No normal appearing right ovarian parenchyma is visible. There is a paraovarian cyst on the right measuring up to 1.6 cm.  Left ovary  Measurements: 2.4 x 1.9 x 4.7 cm. There are 2 simple left ovarian cysts, measuring up to 2.3 cm.  Pulsed Doppler evaluation of both ovaries demonstrates normal low-resistance arterial and venous waveforms for the left ovary. There was a sub optimal vascular assessment of the right ovary, as no normal right ovarian parenchyma could be observed.  Other findings  No free fluid.  IMPRESSION: 1. 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 No normal right ovarian parenchyma visible. Nondiagnostic vascular evaluation of the right ovary. 2. Intramural uterine fibroid measuring up to 3.6 cm 3. Normal left ovary with normal perfusion on Doppler.   Electronically Signed   By: Andreas Newport M.D.   On: 09/24/2014 22:09   [place] Art/ven Flow Abd Pelv Doppler  09/24/2014   CLINICAL DATA:  Bilateral pelvic pain. Menorrhagia. Known right ovarian dermoid.  EXAM: TRANSABDOMINAL AND TRANSVAGINAL ULTRASOUND OF PELVIS  DOPPLER ULTRASOUND OF OVARIES  TECHNIQUE: Both transabdominal and transvaginal ultrasound examinations of the pelvis were performed. Transabdominal technique was performed for global imaging of the pelvis including uterus, ovaries, adnexal regions, and pelvic cul-de-sac.  It was necessary to proceed with endovaginal exam following the transabdominal exam to visualize  the uterus and ovaries. Color and duplex Doppler ultrasound was utilized to evaluate blood flow to the ovaries.  COMPARISON:  04/17/2014  FINDINGS: Uterus  Measurements: 7.3 x 7.9 x 12.1 cm. There is an intramural fibroid measuring 3.6 cm, unchanged from 04/17/2014.  Endometrium  Thickness: 7 .4 mm. There is slight displacement of the endometrium by the fibroid.  Right ovary  Measurements: The known right ovarian dermoid measures 4.6 x 5.0 x 7.0 cm. No normal appearing right ovarian parenchyma is visible. There is a paraovarian cyst on the right measuring up to 1.6 cm.  Left ovary  Measurements: 2.4 x 1.9 x 4.7 cm. There are 2 simple left ovarian cysts, measuring up to 2.3 cm.  Pulsed Doppler evaluation of both ovaries demonstrates normal low-resistance arterial and venous waveforms for the left ovary. There was a sub optimal vascular assessment of the right ovary, as no normal right ovarian parenchyma could be observed.  Other findings  No free fluid.  IMPRESSION: 1. 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 No normal right ovarian parenchyma visible. Nondiagnostic vascular evaluation of the right ovary. 2. Intramural uterine fibroid measuring up to 3.6 cm 3. Normal left ovary with normal perfusion on Doppler.   Electronically Signed   By: Andreas Newport M.D.   On: 09/24/2014 22:09   MAU COURSE Pain improved significantly with Percocet. Dr. Gala Romney at bedside discussing severe anemia, ultrasound results and that patient will need to have surgery to remove the dermoid  cyst. Although there was nondiagnostic vascular evaluation of right ovary on ultrasound Dr. Gala Romney has low suspicion for ovarian torsion. No indication for emergency surgery at this time.  ASSESSMENT 1. Abdominal pain, acute, bilateral lower quadrant   2. Menorrhagia with irregular cycle   3. Dermoid cyst of right ovary    PLAN Observe on women's unit.  Blood transfusion Megace  Manya Silvas, [place] 09/24/2014  8:32 PM

## 2014-09-25 NOTE — Progress Notes (Addendum)
Message left for financial counselor to see pt this morning. Also, spoke with social worker, Clarise Cruz - she stated that pt needed to be seen by the financial counselor.

## 2014-09-25 NOTE — Progress Notes (Signed)
Ur chart review completed.  

## 2014-09-25 NOTE — Discharge Summary (Signed)
Physician Discharge Summary  Patient ID: Anne Schaefer MRN: [id number] DOB/AGE: [birth year] 44 y.o.  Admit date: 09/24/2014 Discharge date: 09/25/2014  Admission Diagnoses: Menorrhagia causing anemia Left dermoid cyst  Discharge Diagnoses:  Active Problems:   Menorrhalgia   Acute blood loss anemia   Menorrhagia Left dermoid cyst  Discharged Condition: good  CBC    Component Value Date/Time   WBC 15.6* 09/24/2014 2050   RBC 3.28* 09/24/2014 2050   HGB 6.0* 09/24/2014 2050   HCT 20.5* 09/24/2014 2050   PLT 338 09/24/2014 2050   MCV 62.5* 09/24/2014 2050   MCH 18.3* 09/24/2014 2050   MCHC 29.3* 09/24/2014 2050   RDW 22.4* 09/24/2014 2050   LYMPHSABS 2.4 04/17/2014 1303   MONOABS 0.6 04/17/2014 1303   EOSABS 0.3 04/17/2014 1303   BASOSABS 0.0 04/17/2014 1303     Hospital Course: 44 year old female admitted with symptomatic anemia.  Pt also some pelvic cramping and has known left dermoid cyst.  Pt had [place] which showed slight enlargement of dermoid cyst, a fibroid with submucosal component, and paratubal cysts.  (Of note, the [place] report does not mention a submucosal component, but reviewed films with Dr. Kris Hartmann who confirms there is a submucosal component).  Clinical exam not consistent with ovarian torsion although vascular images were sub optimal.  Patient was also started on Megace which slowed her bleeding to spotting.  Pt discharged in stable condition after post transfusion CBC was obtained.  Pt has f/u appt with dr. Roselie Awkward next week.  Patient also to fill out financial paperwork and SW to see today.  Consults: None  Significant Diagnostic Studies: labs: cbc and radiology: Ultrasound: fibroid uterus with anterior fibroid having submucosal component (reviewed with Dr. Kris Hartmann)  Treatments: blood transfusion-3 units PRBC  Discharge Exam: Blood pressure 155/94, pulse 70, temperature 97.8 F (36.6 C), temperature source Oral, resp. rate 18, height 5\' 2"  (1.575 m), weight 230 lb  (104.327 kg), last menstrual period 09/18/2014, SpO2 100 %. General appearance: alert, cooperative and no distress GI: soft, non-tender; bowel sounds normal; no masses,  no organomegaly Pelvic: spotting on pad Extremities: extremities normal, atraumatic, no cyanosis or edema and Homans sign is negative, no sign of DVT  Disposition: 01-Home or Self Care     Medication List    STOP taking these medications        guaiFENesin-codeine 100-10 MG/5ML syrup     HYDROcodone-acetaminophen 5-325 MG per tablet  Commonly known as:  NORCO/VICODIN     ondansetron 4 MG tablet  Commonly known as:  ZOFRAN     oxyCODONE-acetaminophen 5-325 MG per tablet  Commonly known as:  PERCOCET/ROXICET     predniSONE 20 MG tablet  Commonly known as:  DELTASONE      TAKE these medications        acetaminophen 500 MG tablet  Commonly known as:  TYLENOL  Take 1,000 mg by mouth every 8 (eight) hours as needed for mild pain (pain).     albuterol 108 (90 BASE) MCG/ACT inhaler  Commonly known as:  PROVENTIL HFA;VENTOLIN HFA  Inhale 2 puffs into the lungs every 6 (six) hours as needed for wheezing or shortness of breath.     loratadine 10 MG tablet  Commonly known as:  CLARITIN  Take 1 tablet (10 mg total) by mouth daily.     megestrol 40 MG tablet  Commonly known as:  MEGACE  Take one tablet 3 times a day for 3 days, then one tablet a day  for 3 days, then one tablet daily until next appointment.     mometasone-formoterol 200-5 MCG/ACT Aero  Commonly known as:  DULERA  Inhale 2 puffs into the lungs 2 (two) times daily.      ASK your doctor about these medications        hydrochlorothiazide 25 MG tablet  Commonly known as:  HYDRODIURIL  Take 1 tablet (25 mg total) by mouth daily.           Follow-up Information    Go on 10/02/2014 to follow up.      Follow up with Emeterio Reeve, MD. Go on 10/02/2014.   Specialty:  Obstetrics and Gynecology   Why:  Appt scheduled for 3:00 pm   Contact  information:   [place] [address] [phone]       Follow up with Emeterio Reeve, MD On 10/02/2014.   Specialty:  Obstetrics and Gynecology   Why:  3 pm (appointment made)   Contact information:   [place] [address] [phone]       Signed: Guss Bunde 09/25/2014, 9:11 AM

## 2014-09-26 LAB — TYPE AND SCREEN
ABO/RH(D): AB POS
Antibody Screen: NEGATIVE
UNIT DIVISION: 0
Unit division: 0
Unit division: 0

## 2014-10-02 ENCOUNTER — Ambulatory Visit (INDEPENDENT_AMBULATORY_CARE_PROVIDER_SITE_OTHER): Payer: Self-pay | Admitting: Obstetrics & Gynecology

## 2014-10-02 ENCOUNTER — Telehealth: Payer: Self-pay

## 2014-10-02 ENCOUNTER — Encounter: Payer: Self-pay | Admitting: Obstetrics & Gynecology

## 2014-10-02 VITALS — BP 143/89 | HR 98 | Temp 98.7°F | Ht 62.0 in | Wt 231.3 lb

## 2014-10-02 DIAGNOSIS — D270 Benign neoplasm of right ovary: Secondary | ICD-10-CM

## 2014-10-02 DIAGNOSIS — D251 Intramural leiomyoma of uterus: Secondary | ICD-10-CM | POA: Insufficient documentation

## 2014-10-02 DIAGNOSIS — N920 Excessive and frequent menstruation with regular cycle: Secondary | ICD-10-CM

## 2014-10-02 MED ORDER — MEGESTROL ACETATE 40 MG PO TABS: 40.0000 mg | ORAL_TABLET | Freq: Two times a day (BID) | ORAL | Status: DC

## 2014-10-02 NOTE — Telephone Encounter (Signed)
Opened in error

## 2014-10-02 NOTE — Patient Instructions (Signed)
Abdominal Hysterectomy °Abdominal hysterectomy is a surgery to remove your womb (uterus). Your womb is the part of your body that contains a growing baby. The surgery may be done for many reasons. These may include cancer, growths (tumors), long-term pain, or bleeding. You may also need other reproductive parts removed during this surgery. This will depend on why you need to have the surgery. °BEFORE THE PROCEDURE °· Talk to your doctor about the changes to your body. These changes may be physical and emotional. °· You may need to have blood work done. You may also need X-rays done. °· Quit smoking if you smoke. Ask your doctor for help. °· Stop taking medicines that thin your blood as told by your doctor. °· Your doctor may have you take other medicines. Take all medicines as told by your doctor. °· Do not eat or drink anything for 6-8 hours before surgery. °· Take your normal medicines with a small sip of water. °· Shower or take a bath the night or morning before surgery. °PROCEDURE °· This surgery is done in the hospital. °· You are given a medicine that makes you go to sleep (general anesthetic). °· The doctor will make a cut (incision) through the skin in your lower belly. °· The cut may be about 5-7 inches long. It may go side-to-side or up-and-down. °· The doctor will move the body tissue that covers your womb. The doctor will carefully remove your womb. The doctor may remove any other reproductive parts that need to be removed. °· The doctor will use clamps or stitches (sutures) to control bleeding. °· The doctor will close your cut with stitches or metal clips. °AFTER THE PROCEDURE °· You will have pain right after the procedure. °· You will be given pain medicine in the recovery room. °· You will be taken to your hospital room after the medicines that made you go to sleep wear off. °· You will be told how to take care of yourself at home. °Document Released: 07/03/2013 Document Reviewed:  07/03/2013 °[employer]® Patient Information ©2015 [employer]. This information is not intended to replace advice given to you by your health care provider. Make sure you discuss any questions you have with your health care provider. ° °

## 2014-10-03 ENCOUNTER — Other Ambulatory Visit: Payer: Self-pay | Admitting: *Deleted

## 2014-10-03 DIAGNOSIS — N938 Other specified abnormal uterine and vaginal bleeding: Secondary | ICD-10-CM

## 2014-10-03 MED ORDER — MEGESTROL ACETATE 40 MG PO TABS: 40.0000 mg | ORAL_TABLET | Freq: Two times a day (BID) | ORAL | Status: DC

## 2014-10-03 NOTE — Progress Notes (Signed)
Patient ID: Anne Schaefer, female   DOB: [birth year], 44 y.o.   MRN: [id number]  Chief Complaint  Patient presents with  . Follow-up    HPI Anne Schaefer is a 44 y.o. female.  [id number] Patient's last menstrual period was 09/18/2014. Menorrhagia, h/o dermoid, has fibroid and menorrhagia. Wants to schedule hysterectomy and removal of her dermoid. She was admitted for transfusion last week.   HPI  Past Medical History  Diagnosis Date  . Asthma   . Obesity   . Seasonal allergies   . Diverticulitis   . Ovarian cyst   . Infection     trich  . Hypertension     Past Surgical History  Procedure Laterality Date  . Tubal ligation    . Cesarean section    . Forehead reconstruction      as a child    Family History  Problem Relation Age of Onset  . Diabetes Mother   . Heart disease Mother     chf  . Hypertension Father     Social History History  Substance Use Topics  . Smoking status: Current Some Day Smoker -- 0.25 packs/day for 8 years    Types: Cigarettes  . Smokeless tobacco: Never Used     Comment: 3-4 cigs per day  . Alcohol Use: 0.0 oz/week    Allergies  Allergen Reactions  . Ibuprofen Shortness Of Breath  . Azithromycin Hives and Rash  . Other Itching and Rash    "[place] [employer]"    Current Outpatient Prescriptions  Medication Sig Dispense Refill  . acetaminophen (TYLENOL) 500 MG tablet Take 1,000 mg by mouth every 8 (eight) hours as needed for mild pain (pain).     Marland Kitchen albuterol (PROVENTIL HFA;VENTOLIN HFA) 108 (90 BASE) MCG/ACT inhaler Inhale 2 puffs into the lungs every 6 (six) hours as needed for wheezing or shortness of breath. 1 Inhaler 2  . hydrochlorothiazide (HYDRODIURIL) 25 MG tablet Take 1 tablet (25 mg total) by mouth daily. 90 tablet 3  . megestrol (MEGACE) 40 MG tablet Take 1 tablet (40 mg total) by mouth 2 (two) times daily. Take one tablet 3 times a day for 3 days, then one tablet a day for 3 days, then one tablet daily until next  appointment. 60 tablet 3  . mometasone-formoterol (DULERA) 200-5 MCG/ACT AERO Inhale 2 puffs into the lungs 2 (two) times daily. 1 Inhaler 0   No current facility-administered medications for this visit.    Review of Systems Review of Systems  Constitutional: Negative.   Respiratory: Negative.   Endocrine: Negative.   Genitourinary: Positive for menstrual problem and pelvic pain (cramps). Negative for vaginal bleeding.    Blood pressure 143/89, pulse 98, temperature 98.7 F (37.1 C), temperature source Oral, height 5\' 2"  (1.575 m), weight 231 lb 4.8 oz (104.917 kg), last menstrual period 09/18/2014.  Physical Exam Physical Exam  Constitutional: She appears well-developed. No distress.  Pulmonary/Chest: Effort normal.  Skin: Skin is warm and dry.  Psychiatric: She has a normal mood and affect. Her behavior is normal.    Data Reviewed  CLINICAL DATA: Bilateral pelvic pain. Menorrhagia. Known right ovarian dermoid.  EXAM: TRANSABDOMINAL AND TRANSVAGINAL ULTRASOUND OF PELVIS  DOPPLER ULTRASOUND OF OVARIES  TECHNIQUE: Both transabdominal and transvaginal ultrasound examinations of the pelvis were performed. Transabdominal technique was performed for global imaging of the pelvis including uterus, ovaries, adnexal regions, and pelvic cul-de-sac.  It was necessary to proceed with endovaginal exam following the transabdominal exam to  visualize the uterus and ovaries. Color and duplex Doppler ultrasound was utilized to evaluate blood flow to the ovaries.  COMPARISON: 04/17/2014  FINDINGS: Uterus  Measurements: 7.3 x 7.9 x 12.1 cm. There is an intramural fibroid measuring 3.6 cm, unchanged from 04/17/2014.  Endometrium  Thickness: 7 .4 mm. There is slight displacement of the endometrium by the fibroid.  Right ovary  Measurements: The known right ovarian dermoid measures 4.6 x 5.0 x 7.0 cm. No normal appearing right ovarian parenchyma is visible. There  is a paraovarian cyst on the right measuring up to 1.6 cm.  Left ovary  Measurements: 2.4 x 1.9 x 4.7 cm. There are 2 simple left ovarian cysts, measuring up to 2.3 cm.  Pulsed Doppler evaluation of both ovaries demonstrates normal low-resistance arterial and venous waveforms for the left ovary. There was a sub optimal vascular assessment of the right ovary, as no normal right ovarian parenchyma could be observed.  Other findings  No free fluid.  IMPRESSION: 1. 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 No normal right ovarian parenchyma visible. Nondiagnostic vascular evaluation of the right ovary. 2. Intramural uterine fibroid measuring up to 3.6 cm 3. Normal left ovary with normal perfusion on Doppler.   Electronically Signed  By: Andreas Newport M.D.  On: 09/24/2014 22:09     Assessment    Fibroid, right dermoid, menorrhagia and anemia     Plan    Megace, financial assistance, needs pap and EMB, will schedule TAH RSO        ARNOLD,JAMES 10/03/2014, 8:51 AM

## 2014-10-04 LAB — HEMOGLOBINOPATHY EVALUATION
HGB A2 QUANT: 0.8 % (ref 0.7–3.1)
HGB A: 98.2 % — AB (ref 94.0–98.0)
HGB S QUANTITAION: 0 %
HGB VARIANT: 1 % — AB
Hgb C: 0 %
Hgb F Quant: 0 % (ref 0.0–2.0)

## 2014-10-05 ENCOUNTER — Emergency Department (HOSPITAL_COMMUNITY)
Admission: EM | Admit: 2014-10-05 | Discharge: 2014-10-05 | Disposition: A | Discharge status: Home / Self Care | Payer: Self-pay | Attending: Emergency Medicine | Admitting: Emergency Medicine

## 2014-10-05 ENCOUNTER — Encounter (HOSPITAL_COMMUNITY): Payer: Self-pay | Admitting: *Deleted

## 2014-10-05 ENCOUNTER — Emergency Department (HOSPITAL_COMMUNITY): Payer: Self-pay

## 2014-10-05 DIAGNOSIS — I10 Essential (primary) hypertension: Secondary | ICD-10-CM | POA: Insufficient documentation

## 2014-10-05 DIAGNOSIS — Z720 Tobacco use: Secondary | ICD-10-CM | POA: Insufficient documentation

## 2014-10-05 DIAGNOSIS — Z8719 Personal history of other diseases of the digestive system: Secondary | ICD-10-CM | POA: Insufficient documentation

## 2014-10-05 DIAGNOSIS — Z3202 Encounter for pregnancy test, result negative: Secondary | ICD-10-CM | POA: Insufficient documentation

## 2014-10-05 DIAGNOSIS — Z7951 Long term (current) use of inhaled steroids: Secondary | ICD-10-CM | POA: Insufficient documentation

## 2014-10-05 DIAGNOSIS — Z9851 Tubal ligation status: Secondary | ICD-10-CM | POA: Insufficient documentation

## 2014-10-05 DIAGNOSIS — Z8619 Personal history of other infectious and parasitic diseases: Secondary | ICD-10-CM | POA: Insufficient documentation

## 2014-10-05 DIAGNOSIS — Z79899 Other long term (current) drug therapy: Secondary | ICD-10-CM | POA: Insufficient documentation

## 2014-10-05 DIAGNOSIS — N8320 Unspecified ovarian cysts: Secondary | ICD-10-CM | POA: Insufficient documentation

## 2014-10-05 DIAGNOSIS — N83209 Unspecified ovarian cyst, unspecified side: Secondary | ICD-10-CM

## 2014-10-05 DIAGNOSIS — E669 Obesity, unspecified: Secondary | ICD-10-CM | POA: Insufficient documentation

## 2014-10-05 DIAGNOSIS — J45909 Unspecified asthma, uncomplicated: Secondary | ICD-10-CM | POA: Insufficient documentation

## 2014-10-05 LAB — COMPREHENSIVE METABOLIC PANEL
ALK PHOS: 41 U/L (ref 39–117)
ALT: 17 U/L (ref 0–35)
ANION GAP: 8 (ref 5–15)
AST: 21 U/L (ref 0–37)
Albumin: 3.9 g/dL (ref 3.5–5.2)
BILIRUBIN TOTAL: 0.3 mg/dL (ref 0.3–1.2)
BUN: 11 mg/dL (ref 6–23)
CALCIUM: 8.7 mg/dL (ref 8.4–10.5)
CO2: 19 mmol/L (ref 19–32)
Chloride: 108 mmol/L (ref 96–112)
Creatinine, Ser: 0.67 mg/dL (ref 0.50–1.10)
GFR calc non Af Amer: >90 mL/min (ref >90)
Glucose, Bld: 100 mg/dL — ABNORMAL HIGH (ref 70–99)
Potassium: 3.6 mmol/L (ref 3.5–5.1)
SODIUM: 135 mmol/L (ref 135–145)
Total Protein: 7.3 g/dL (ref 6.0–8.3)

## 2014-10-05 LAB — CBC WITH DIFFERENTIAL/PLATELET
BASOS ABS: 0 10*3/uL (ref 0.0–0.1)
Basophils Relative: 0 % (ref 0–1)
Eosinophils Absolute: 0 10*3/uL (ref 0.0–0.7)
Eosinophils Relative: 0 % (ref 0–5)
HEMATOCRIT: 32 % — AB (ref 36.0–46.0)
Hemoglobin: 9.9 g/dL — ABNORMAL LOW (ref 12.0–15.0)
LYMPHS PCT: 8 % — AB (ref 12–46)
Lymphs Abs: 1 10*3/uL (ref 0.7–4.0)
MCH: 21.6 pg — ABNORMAL LOW (ref 26.0–34.0)
MCHC: 30.9 g/dL (ref 30.0–36.0)
MCV: 69.9 fL — ABNORMAL LOW (ref 78.0–100.0)
MONO ABS: 0.4 10*3/uL (ref 0.1–1.0)
MONOS PCT: 3 % (ref 3–12)
NEUTROS PCT: 89 % — AB (ref 43–77)
Neutro Abs: 11.7 10*3/uL — ABNORMAL HIGH (ref 1.7–7.7)
PLATELETS: 376 10*3/uL (ref 150–400)
RBC: 4.58 MIL/uL (ref 3.87–5.11)
RDW: 26.4 % — ABNORMAL HIGH (ref 11.5–15.5)
WBC: 13.1 10*3/uL — ABNORMAL HIGH (ref 4.0–10.5)

## 2014-10-05 LAB — URINALYSIS, ROUTINE W REFLEX MICROSCOPIC
BILIRUBIN URINE: NEGATIVE
GLUCOSE, UA: NEGATIVE mg/dL
Hgb urine dipstick: NEGATIVE
Ketones, ur: >80 mg/dL — AB
LEUKOCYTES UA: NEGATIVE
Nitrite: NEGATIVE
Protein, ur: NEGATIVE mg/dL
Specific Gravity, Urine: 1.026 (ref 1.005–1.030)
Urobilinogen, UA: 0.2 mg/dL (ref 0.0–1.0)
pH: 6 (ref 5.0–8.0)

## 2014-10-05 LAB — LIPASE, BLOOD: LIPASE: 16 U/L (ref 11–59)

## 2014-10-05 LAB — WET PREP, GENITAL
TRICH WET PREP: NONE SEEN
YEAST WET PREP: NONE SEEN

## 2014-10-05 LAB — PREGNANCY, URINE: PREG TEST UR: NEGATIVE

## 2014-10-05 MED ORDER — ONDANSETRON HCL 4 MG/2ML IJ SOLN
4.0000 mg | Freq: Once | INTRAMUSCULAR | Status: AC
  Administered 2014-10-05: 4 mg via INTRAVENOUS
  Filled 2014-10-05: qty 2

## 2014-10-05 MED ORDER — HYDROMORPHONE HCL 1 MG/ML IJ SOLN
1.0000 mg | Freq: Once | INTRAMUSCULAR | Status: AC
  Administered 2014-10-05: 1 mg via INTRAVENOUS
  Filled 2014-10-05: qty 1

## 2014-10-05 MED ORDER — OXYCODONE-ACETAMINOPHEN 5-325 MG PO TABS
1.0000 | ORAL_TABLET | ORAL | Status: DC | PRN
Start: 2014-10-05 — End: 2014-11-27

## 2014-10-05 MED ORDER — MORPHINE SULFATE 4 MG/ML IJ SOLN
4.0000 mg | Freq: Once | INTRAMUSCULAR | Status: AC
  Administered 2014-10-05: 4 mg via INTRAVENOUS
  Filled 2014-10-05: qty 1

## 2014-10-05 MED ORDER — FENTANYL CITRATE 0.05 MG/ML IJ SOLN
50.0000 ug | Freq: Once | INTRAMUSCULAR | Status: AC
  Administered 2014-10-05: 50 ug via INTRAVENOUS
  Filled 2014-10-05: qty 2

## 2014-10-05 NOTE — ED Provider Notes (Signed)
CSN: [id number]     Arrival date & time 10/05/14  1233 History   First MD Initiated Contact with Patient 10/05/14 1351     Chief Complaint  Patient presents with  . Abdominal Pain  . Ovarian Cyst     (Consider location/radiation/quality/duration/timing/severity/associated sxs/prior Treatment) HPI  Pt presenting with right sided lower abdominal/pelvic pain.  She states that pain was present upon awakening this morning.  She has a known right dermoid cyst and she is currently making arrangements for hysterectomy.  She states she has not had much problems with pain from this cyst in the past.  [facility] and [facility]  She states pain is worse with passing BM this morning and with urination.  Pain was acute in onset.  No fever/chills.  Pain is sharp.   She has not had any treatment prior to arrival.  There are no other associated systemic symptoms, there are no other alleviating or modifying factors.   Past Medical History  Diagnosis Date  . Asthma   . Obesity   . Seasonal allergies   . Diverticulitis   . Ovarian cyst   . Infection     trich  . Hypertension    Past Surgical History  Procedure Laterality Date  . Tubal ligation    . Cesarean section    . Forehead reconstruction      as a child   Family History  Problem Relation Age of Onset  . Diabetes Mother   . Heart disease Mother     chf  . Hypertension Father    History  Substance Use Topics  . Smoking status: Current Some Day Smoker -- 0.25 packs/day for 8 years    Types: Cigarettes  . Smokeless tobacco: Never Used     Comment: 3-4 cigs per day  . Alcohol Use: 0.0 oz/week   OB History    Gravida Para Term Preterm AB TAB SAB Ectopic Multiple Living   5 5 4 1  0 0 0 0 0 5     Review of Systems  ROS reviewed and all otherwise negative except for mentioned in HPI    Allergies  Ibuprofen; Azithromycin; and Other  Home Medications   Prior to  Admission medications   Medication Sig Start Date End Date Taking? Authorizing Provider  acetaminophen (TYLENOL) 500 MG tablet Take 1,000-1,500 mg by mouth every 8 (eight) hours as needed for mild pain (pain).    Yes Historical Provider, MD  albuterol (PROVENTIL HFA;VENTOLIN HFA) 108 (90 BASE) MCG/ACT inhaler Inhale 2 puffs into the lungs every 6 (six) hours as needed for wheezing or shortness of breath. 09/09/14  Yes Jennifer Piepenbrink, PA-C  hydrochlorothiazide (HYDRODIURIL) 25 MG tablet Take 1 tablet (25 mg total) by mouth daily. 03/08/14  Yes Lorayne Marek, MD  megestrol (MEGACE) 40 MG tablet Take 1 tablet (40 mg total) by mouth 2 (two) times daily. Take one tablet 3 times a day for 3 days, then one tablet a day for 3 days, then one tablet daily until next appointment. 10/03/14  Yes Woodroe Mode, MD  mometasone-formoterol [facility]) 200-5 MCG/ACT AERO Inhale 2 puffs into the lungs 2 (two) times daily. 09/09/14  Yes Jennifer Piepenbrink, PA-C   BP 168/73 mmHg  Pulse 82  Temp(Src) 98.1 F (36.7 C) (Oral)  Resp 16  SpO2 100%  LMP 09/18/2014  Vitals reviewed Physical Exam  Physical Examination: General appearance - alert, well appearing, and in no distress  Mental status - alert, oriented to person, place, and time Eyes - no conjunctival injection, no scleral icterus Mouth - mucous membranes moist, pharynx normal without lesions Chest - clear to auscultation, no wheezes, rales or rhonchi, symmetric air entry Heart - normal rate, regular rhythm, normal S1, S2, no murmurs, rubs, clicks or gallops Abdomen - soft, ttp in right lower abdomen- pelvic region,, nondistended, no masses or organomegaly Pelvic - normal external genitalia, vulva, vagina, cervix, uterus and adnexa, ttp in right adnexal region, no CMT Extremities - peripheral pulses normal, no pedal edema, no clubbing or cyanosis Skin - normal coloration and turgor, no rashes  ED Course  Procedures (including critical care time) Labs  Review Labs Reviewed  CBC WITH DIFFERENTIAL/PLATELET - Abnormal; Notable for the following:    WBC 13.1 (*)    Hemoglobin 9.9 (*)    HCT 32.0 (*)    MCV 69.9 (*)    MCH 21.6 (*)    RDW 26.4 (*)    Neutrophils Relative % 89 (*)    Lymphocytes Relative 8 (*)    Neutro Abs 11.7 (*)    All other components within normal limits  COMPREHENSIVE METABOLIC PANEL - Abnormal; Notable for the following:    Glucose, Bld 100 (*)    All other components within normal limits  URINALYSIS, ROUTINE W REFLEX MICROSCOPIC - Abnormal; Notable for the following:    APPearance CLOUDY (*)    Ketones, ur >80 (*)    All other components within normal limits  WET PREP, GENITAL  LIPASE, BLOOD  PREGNANCY, URINE  GC/CHLAMYDIA PROBE AMP ([place])    Imaging Review No results found.   EKG Interpretation None      MDM   Final diagnoses:  Ovarian cyst    Pt with known right dermoid cyst- having right sided pelvic pain- on pelvic exam her tenderness is localized to the right adnexa.  Pelvic ultrasound pending.  Pt signed out to Dr. Wilson Singer pending pelvic ultrasound results and further pain control, wet prep as well.  Pt is followed by Dr. Gwenlyn Perking, GYn.    Alfonzo Beers, MD 10/06/14 [phone]

## 2014-10-05 NOTE — ED Notes (Signed)
Pt reports RLQ abd pain since waking up at 7am, took tylenol, pain progressively worsening, with BM and urination. Feels as though pain eases with holding the area. Reports nausea, denies vomiting. Has an ovarian cyst, and is waiting for a hysterectomy. Sts she had the same pain when she was seen at [facility] on the 15th and 23rd of this month, but she was having heavy menstrual bleeding then and reports that she is not bleeding currently. Received a blood transfusion on the 15th.

## 2014-10-05 NOTE — Discharge Instructions (Signed)
Ovarian Cyst An ovarian cyst is a fluid-filled sac that forms on an ovary. The ovaries are small organs that produce eggs in women. Various types of cysts can form on the ovaries. Most are not cancerous. Many do not cause problems, and they often go away on their own. Some may cause symptoms and require treatment. Common types of ovarian cysts include:  Functional cysts--These cysts may occur every month during the menstrual cycle. This is normal. The cysts usually go away with the next menstrual cycle if the woman does not get pregnant. Usually, there are no symptoms with a functional cyst.  Endometrioma cysts--These cysts form from the tissue that lines the uterus. They are also called "chocolate cysts" because they become filled with blood that turns brown. This type of cyst can cause pain in the lower abdomen during intercourse and with your menstrual period.  Cystadenoma cysts--This type develops from the cells on the outside of the ovary. These cysts can get very big and cause lower abdomen pain and pain with intercourse. This type of cyst can twist on itself, cut off its blood supply, and cause severe pain. It can also easily rupture and cause a lot of pain.  Dermoid cysts--This type of cyst is sometimes found in both ovaries. These cysts may contain different kinds of body tissue, such as skin, teeth, hair, or cartilage. They usually do not cause symptoms unless they get very big.  Theca lutein cysts--These cysts occur when too much of a certain hormone (human chorionic gonadotropin) is produced and overstimulates the ovaries to produce an egg. This is most common after procedures used to assist with the conception of a baby (in vitro fertilization). CAUSES   Fertility drugs can cause a condition in which multiple large cysts are formed on the ovaries. This is called ovarian hyperstimulation syndrome.  A condition called polycystic ovary syndrome can cause hormonal imbalances that can lead to  nonfunctional ovarian cysts. SIGNS AND SYMPTOMS  Many ovarian cysts do not cause symptoms. If symptoms are present, they may include:  Pelvic pain or pressure.  Pain in the lower abdomen.  Pain during sexual intercourse.  Increasing girth (swelling) of the abdomen.  Abnormal menstrual periods.  Increasing pain with menstrual periods.  Stopping having menstrual periods without being pregnant. DIAGNOSIS  These cysts are commonly found during a routine or annual pelvic exam. Tests may be ordered to find out more about the cyst. These tests may include:  Ultrasound.  X-ray of the pelvis.  CT scan.  MRI.  Blood tests. TREATMENT  Many ovarian cysts go away on their own without treatment. Your health care provider may want to check your cyst regularly for 2-3 months to see if it changes. For women in menopause, it is particularly important to monitor a cyst closely because of the higher rate of ovarian cancer in menopausal women. When treatment is needed, it may include any of the following:  A procedure to drain the cyst (aspiration). This may be done using a long needle and ultrasound. It can also be done through a laparoscopic procedure. This involves using a thin, lighted tube with a tiny camera on the end (laparoscope) inserted through a small incision.  Surgery to remove the whole cyst. This may be done using laparoscopic surgery or an open surgery involving a larger incision in the lower abdomen.  Hormone treatment or birth control pills. These methods are sometimes used to help dissolve a cyst. HOME CARE INSTRUCTIONS   Only take over-the-counter   or prescription medicines as directed by your health care provider.  Follow up with your health care provider as directed.  Get regular pelvic exams and Pap tests. SEEK MEDICAL CARE IF:   Your periods are late, irregular, or painful, or they stop.  Your pelvic pain or abdominal pain does not go away.  Your abdomen becomes  larger or swollen.  You have pressure on your bladder or trouble emptying your bladder completely.  You have pain during sexual intercourse.  You have feelings of fullness, pressure, or discomfort in your stomach.  You lose weight for no apparent reason.  You feel generally ill.  You become constipated.  You lose your appetite.  You develop acne.  You have an increase in body and facial hair.  You are gaining weight, without changing your exercise and eating habits.  You think you are pregnant. SEEK IMMEDIATE MEDICAL CARE IF:   You have increasing abdominal pain.  You feel sick to your stomach (nauseous), and you throw up (vomit).  You develop a fever that comes on suddenly.  You have abdominal pain during a bowel movement.  Your menstrual periods become heavier than usual. MAKE SURE YOU:  Understand these instructions.  Will watch your condition.  Will get help right away if you are not doing well or get worse. Document Released: 06/28/2005 Document Revised: 07/03/2013 Document Reviewed: 03/05/2013 ExitCare Patient Information 2015 [employer]. This information is not intended to replace advice given to you by your health care provider. Make sure you discuss any questions you have with your health care provider.  

## 2014-10-05 NOTE — ED Notes (Signed)
Bedside report received from previous RN, Merle.

## 2014-10-05 NOTE — ED Notes (Signed)
Patient transported to Ultrasound 

## 2014-10-07 LAB — GC/CHLAMYDIA PROBE AMP (~~LOC~~) NOT AT ARMC
CHLAMYDIA, DNA PROBE: NEGATIVE
NEISSERIA GONORRHEA: NEGATIVE

## 2014-10-24 ENCOUNTER — Telehealth: Payer: Self-pay | Admitting: *Deleted

## 2014-10-24 DIAGNOSIS — N938 Other specified abnormal uterine and vaginal bleeding: Secondary | ICD-10-CM

## 2014-10-24 MED ORDER — MEGESTROL ACETATE 40 MG PO TABS: ORAL_TABLET | ORAL | Status: DC

## 2014-10-24 NOTE — Telephone Encounter (Signed)
Pt called and stated that her recent Rx for Megace was not sent to her pharmacy. I asked pt if she has been taking the medication and she stated yes she has been taking it and now takes one tablet daily. She is not having any bleeding now. I stated that I will send the Rx and she should continue to take 1 tablet daily even though the instructions on the bottle say differently.  Pt voiced understanding.

## 2014-11-12 ENCOUNTER — Telehealth: Payer: Self-pay

## 2014-11-12 NOTE — Telephone Encounter (Signed)
Patient called stating her megace had stopped her bleeding but today she noticed bleeding has started again. Wants to know what to do. Called patient who states she noticed spotting this morning and was told she shouldn't bleed at all on megace-- notes pinkish tinge on tissue when wiping only. Reports taking 40mg  daily with no bleeding since mid march. Advised patient continue to monitor and call should she have any increase to heavy bleeding where the megace dose is not managing it anymore. Patient verbalized understanding and gratitude. No further questions or concerns.

## 2014-11-22 ENCOUNTER — Other Ambulatory Visit: Payer: Self-pay | Admitting: Obstetrics & Gynecology

## 2014-11-25 ENCOUNTER — Telehealth: Payer: Self-pay

## 2014-11-25 NOTE — Telephone Encounter (Signed)
Attempted to contact patient to inform her of need for preop appointment. Phone not accepting incoming calls at this time. Message sent to admin pool to schedule and call patient with appointment.

## 2014-11-25 NOTE — Telephone Encounter (Signed)
-----   Message from Woodroe Mode, MD sent at 11/22/2014  3:13 PM EDT ----- Needs preop appt for pap and biopsy

## 2014-11-26 ENCOUNTER — Encounter: Payer: Self-pay | Admitting: Family Medicine

## 2014-11-27 ENCOUNTER — Telehealth: Payer: Self-pay

## 2014-11-27 ENCOUNTER — Inpatient Hospital Stay (HOSPITAL_COMMUNITY)
Admission: AD | Admit: 2014-11-27 | Discharge: 2014-11-27 | Disposition: A | Discharge status: Home / Self Care | Payer: Self-pay | Source: Ambulatory Visit | Attending: Obstetrics & Gynecology | Admitting: Obstetrics & Gynecology

## 2014-11-27 ENCOUNTER — Encounter (HOSPITAL_COMMUNITY): Payer: Self-pay | Admitting: *Deleted

## 2014-11-27 DIAGNOSIS — R1084 Generalized abdominal pain: Secondary | ICD-10-CM

## 2014-11-27 DIAGNOSIS — F17210 Nicotine dependence, cigarettes, uncomplicated: Secondary | ICD-10-CM | POA: Insufficient documentation

## 2014-11-27 DIAGNOSIS — N939 Abnormal uterine and vaginal bleeding, unspecified: Secondary | ICD-10-CM | POA: Insufficient documentation

## 2014-11-27 DIAGNOSIS — D500 Iron deficiency anemia secondary to blood loss (chronic): Secondary | ICD-10-CM | POA: Insufficient documentation

## 2014-11-27 DIAGNOSIS — N938 Other specified abnormal uterine and vaginal bleeding: Secondary | ICD-10-CM

## 2014-11-27 DIAGNOSIS — R109 Unspecified abdominal pain: Secondary | ICD-10-CM | POA: Insufficient documentation

## 2014-11-27 DIAGNOSIS — I10 Essential (primary) hypertension: Secondary | ICD-10-CM | POA: Insufficient documentation

## 2014-11-27 LAB — URINALYSIS, ROUTINE W REFLEX MICROSCOPIC
Bilirubin Urine: NEGATIVE
GLUCOSE, UA: NEGATIVE mg/dL
Ketones, ur: NEGATIVE mg/dL
LEUKOCYTES UA: NEGATIVE
Nitrite: NEGATIVE
Protein, ur: NEGATIVE mg/dL
Specific Gravity, Urine: >1.03 — ABNORMAL HIGH (ref 1.005–1.030)
Urobilinogen, UA: 0.2 mg/dL (ref 0.0–1.0)
pH: 6 (ref 5.0–8.0)

## 2014-11-27 LAB — CBC
HCT: 32.8 % — ABNORMAL LOW (ref 36.0–46.0)
Hemoglobin: 10.8 g/dL — ABNORMAL LOW (ref 12.0–15.0)
MCH: 23.5 pg — ABNORMAL LOW (ref 26.0–34.0)
MCHC: 32.9 g/dL (ref 30.0–36.0)
MCV: 71.3 fL — ABNORMAL LOW (ref 78.0–100.0)
PLATELETS: 339 10*3/uL (ref 150–400)
RBC: 4.6 MIL/uL (ref 3.87–5.11)
RDW: 24.7 % — ABNORMAL HIGH (ref 11.5–15.5)
WBC: 9.8 10*3/uL (ref 4.0–10.5)

## 2014-11-27 LAB — URINE MICROSCOPIC-ADD ON

## 2014-11-27 LAB — POCT PREGNANCY, URINE: Preg Test, Ur: NEGATIVE

## 2014-11-27 MED ORDER — TRAMADOL HCL 50 MG PO TABS: 50.0000 mg | ORAL_TABLET | Freq: Four times a day (QID) | ORAL | Status: DC | PRN

## 2014-11-27 MED ORDER — TRAMADOL HCL 50 MG PO TABS
100.0000 mg | ORAL_TABLET | Freq: Once | ORAL | Status: AC
  Administered 2014-11-27: 100 mg via ORAL
  Filled 2014-11-27: qty 2

## 2014-11-27 MED ORDER — MEGESTROL ACETATE 20 MG PO TABS: 40.0000 mg | ORAL_TABLET | Freq: Every day | ORAL | Status: DC

## 2014-11-27 NOTE — MAU Note (Signed)
Pain in RLQ- sharp pains where the cyst is.  Pt is scheduled for hysterectomy in June.  Has been on megace for bleeding.  Has refilled and used rx. Started bleeding again today.  Was under the impression that  The Dr did not want  Her to have another cycle before her surgery, if she did she would probably need another transfusion.

## 2014-11-27 NOTE — MAU Provider Note (Signed)
History     CSN: [id number]  Arrival date and time: 11/27/14 1520   First Provider Initiated Contact with Patient 11/27/14 1647      Chief Complaint  Patient presents with  . Abdominal Pain   HPI   Anne Schaefer is a 44 y.o. female [phone] who presents with abdominal pain and vagina bleeding.  She is scheduled to have hysterectomy in June.   Since March she has been on megace 40 mg to suppress bleeding and this has been successful. She started having bright red vaginal bleeding; she describes the bleeding as "flowing" however not heavy.  She has gone through 1 pad today.  She ran out of megace and did not call to get a refill. Her last dose was yesterday and it was 40 mg.   She is also having abdominal pain that started today, however it has gotten worse today. She is feeling sharp pain, shooting pain that goes into her vagina. The pain feels similar to contraction like pain. She has not taken anything for pain.   Ibuprofen makes her wheeze.   OB History    Gravida Para Term Preterm AB TAB SAB Ectopic Multiple Living   5 5 4 1  0 0 0 0 0 5      Past Medical History  Diagnosis Date  . Asthma   . Obesity   . Seasonal allergies   . Diverticulitis   . Ovarian cyst   . Infection     trich  . Hypertension     Past Surgical History  Procedure Laterality Date  . Tubal ligation    . Cesarean section    . Forehead reconstruction      as a child    Family History  Problem Relation Age of Onset  . Diabetes Mother   . Heart disease Mother     chf  . Hypertension Father     History  Substance Use Topics  . Smoking status: Current Some Day Smoker -- 0.25 packs/day for 8 years    Types: Cigarettes  . Smokeless tobacco: Never Used     Comment: 3-4 cigs per day  . Alcohol Use: 0.0 oz/week    Allergies:  Allergies  Allergen Reactions  . Ibuprofen Shortness Of Breath  . Azithromycin Hives and Rash  . Other Itching and Rash    "[place]"     Prescriptions prior to admission  Medication Sig Dispense Refill Last Dose  . acetaminophen (TYLENOL) 500 MG tablet Take 1,000 mg by mouth every 8 (eight) hours as needed for mild pain (pain).    11/26/2014 at Unknown time  . albuterol (PROVENTIL HFA;VENTOLIN HFA) 108 (90 BASE) MCG/ACT inhaler Inhale 2 puffs into the lungs every 6 (six) hours as needed for wheezing or shortness of breath. 1 Inhaler 2 11/27/2014 at Unknown time  . hydrochlorothiazide (HYDRODIURIL) 25 MG tablet Take 1 tablet (25 mg total) by mouth daily. 90 tablet 3 11/27/2014 at Unknown time  . IRON PO Take 1 tablet by mouth daily.   11/26/2014 at Unknown time  . megestrol (MEGACE) 40 MG tablet Take one tablet 3 times daily for 3 days, then one tablet twice daily for 3 days, then one tablet daily 60 tablet 3 11/26/2014 at Unknown time  . mometasone-formoterol (DULERA) 200-5 MCG/ACT AERO Inhale 2 puffs into the lungs 2 (two) times daily. 1 Inhaler 0 11/27/2014 at Unknown time  . oxyCODONE-acetaminophen (PERCOCET/ROXICET) 5-325 MG per tablet Take 1-2 tablets by mouth every  4 (four) hours as needed for severe pain. (Patient not taking: Reported on 11/27/2014) 20 tablet 0    Results for orders placed or performed during the hospital encounter of 11/27/14 (from the past 48 hour(s))  CBC     Status: Abnormal   Collection Time: 11/27/14  5:12 PM  Result Value Ref Range   WBC 9.8 4.0 - 10.5 K/uL   RBC 4.60 3.87 - 5.11 MIL/uL   Hemoglobin 10.8 (L) 12.0 - 15.0 g/dL   HCT 32.8 (L) 36.0 - 46.0 %   MCV 71.3 (L) 78.0 - 100.0 fL   MCH 23.5 (L) 26.0 - 34.0 pg   MCHC 32.9 30.0 - 36.0 g/dL   RDW 24.7 (H) 11.5 - 15.5 %   Platelets 339 150 - 400 K/uL  Urinalysis, Routine w reflex microscopic     Status: Abnormal   Collection Time: 11/27/14  5:30 PM  Result Value Ref Range   Color, Urine YELLOW YELLOW   APPearance CLEAR CLEAR   Specific Gravity, Urine >1.030 (H) 1.005 - 1.030   pH 6.0 5.0 - 8.0   Glucose, UA NEGATIVE NEGATIVE mg/dL   Hgb  urine dipstick SMALL (A) NEGATIVE   Bilirubin Urine NEGATIVE NEGATIVE   Ketones, ur NEGATIVE NEGATIVE mg/dL   Protein, ur NEGATIVE NEGATIVE mg/dL   Urobilinogen, UA 0.2 0.0 - 1.0 mg/dL   Nitrite NEGATIVE NEGATIVE   Leukocytes, UA NEGATIVE NEGATIVE  Urine microscopic-add on     Status: Abnormal   Collection Time: 11/27/14  5:30 PM  Result Value Ref Range   Squamous Epithelial / LPF RARE RARE   WBC, UA 3-6 <3 WBC/hpf   Bacteria, UA FEW (A) RARE   Urine-Other MUCOUS PRESENT   Pregnancy, urine POC     Status: None   Collection Time: 11/27/14  5:33 PM  Result Value Ref Range   Preg Test, Ur NEGATIVE NEGATIVE    Comment:        THE SENSITIVITY OF THIS METHODOLOGY IS >24 mIU/mL      Review of Systems  Gastrointestinal: Positive for abdominal pain. Negative for nausea and vomiting.  Neurological: Negative for dizziness.   Physical Exam   Blood pressure 164/97, pulse 107, temperature 98.7 F (37.1 C), temperature source Oral, resp. rate 20, height 5' 3.5" (1.613 m), weight 105.688 kg (233 lb), last menstrual period 11/27/2014.  Physical Exam  Constitutional: She is oriented to person, place, and time. She appears well-developed and well-nourished. No distress.  HENT:  Head: Normocephalic.  Eyes: Pupils are equal, round, and reactive to light.  Neck: Neck supple.  Respiratory: Effort normal.  GI: Normal appearance. There is tenderness in the suprapubic area. There is rigidity. There is no rebound and no guarding.  Genitourinary:  Speculum exam: Vagina - Small amount of dark red blood in the vagina. No pooling.  Cervix - + small amount of bleeding  Bimanual exam: Cervix closed Uterus non tender, enlarged  Adnexa non tender, no masses bilaterally Chaperone present for exam.  Musculoskeletal: Normal range of motion.  Neurological: She is alert and oriented to person, place, and time.  Skin: Skin is warm. She is not diaphoretic.  Psychiatric: Her behavior is normal.    MAU  Course  Procedures  None  MDM  Ultram 2 tabs; with significant relief.  CBC   Assessment and Plan   A:  1. Abnormal vaginal bleeding   2. Iron deficiency anemia due to chronic blood loss   3. Abdominal pain in female  4. DUB (dysfunctional uterine bleeding)     P:  Discharge home in stable condition RX: Ultram, Megace. Continue 40 mg daily Ok to increase to BID on over the counter iron.  Keep appointments in the [facility] precautions.   Lezlie Lye, NP 11/27/2014 4:51 PM

## 2014-11-27 NOTE — Telephone Encounter (Signed)
Patient called stating she is currently bleeding and would like refill of megace until scheduled surgery in June with Dr. Roselie Awkward. Message sent to Dr. Roselie Awkward regarding refill.

## 2014-11-28 NOTE — Telephone Encounter (Signed)
Per chart review-- patient seen in MAU on 11/27/14. Refill for megace given. No need for refill at this time.

## 2014-12-05 ENCOUNTER — Inpatient Hospital Stay (HOSPITAL_COMMUNITY)
Admission: AD | Admit: 2014-12-05 | Discharge: 2014-12-05 | Disposition: A | Discharge status: Home / Self Care | Payer: Self-pay | Source: Ambulatory Visit | Attending: Obstetrics & Gynecology | Admitting: Obstetrics & Gynecology

## 2014-12-05 ENCOUNTER — Encounter (HOSPITAL_COMMUNITY): Payer: Self-pay

## 2014-12-05 DIAGNOSIS — Z79899 Other long term (current) drug therapy: Secondary | ICD-10-CM | POA: Insufficient documentation

## 2014-12-05 DIAGNOSIS — F17210 Nicotine dependence, cigarettes, uncomplicated: Secondary | ICD-10-CM | POA: Insufficient documentation

## 2014-12-05 DIAGNOSIS — I10 Essential (primary) hypertension: Secondary | ICD-10-CM | POA: Insufficient documentation

## 2014-12-05 DIAGNOSIS — E669 Obesity, unspecified: Secondary | ICD-10-CM | POA: Insufficient documentation

## 2014-12-05 DIAGNOSIS — J45909 Unspecified asthma, uncomplicated: Secondary | ICD-10-CM | POA: Insufficient documentation

## 2014-12-05 DIAGNOSIS — Z8249 Family history of ischemic heart disease and other diseases of the circulatory system: Secondary | ICD-10-CM | POA: Insufficient documentation

## 2014-12-05 DIAGNOSIS — Z833 Family history of diabetes mellitus: Secondary | ICD-10-CM | POA: Insufficient documentation

## 2014-12-05 DIAGNOSIS — N939 Abnormal uterine and vaginal bleeding, unspecified: Secondary | ICD-10-CM | POA: Insufficient documentation

## 2014-12-05 LAB — URINALYSIS, ROUTINE W REFLEX MICROSCOPIC
Bilirubin Urine: NEGATIVE
GLUCOSE, UA: NEGATIVE mg/dL
KETONES UR: NEGATIVE mg/dL
NITRITE: NEGATIVE
PROTEIN: NEGATIVE mg/dL
Urobilinogen, UA: 0.2 mg/dL (ref 0.0–1.0)
pH: 5.5 (ref 5.0–8.0)

## 2014-12-05 LAB — CBC
HCT: 31.4 % — ABNORMAL LOW (ref 36.0–46.0)
Hemoglobin: 10.2 g/dL — ABNORMAL LOW (ref 12.0–15.0)
MCH: 23.6 pg — ABNORMAL LOW (ref 26.0–34.0)
MCHC: 32.5 g/dL (ref 30.0–36.0)
MCV: 72.5 fL — ABNORMAL LOW (ref 78.0–100.0)
Platelets: 340 10*3/uL (ref 150–400)
RBC: 4.33 MIL/uL (ref 3.87–5.11)
RDW: 24.3 % — ABNORMAL HIGH (ref 11.5–15.5)
WBC: 11.5 10*3/uL — ABNORMAL HIGH (ref 4.0–10.5)

## 2014-12-05 LAB — URINE MICROSCOPIC-ADD ON

## 2014-12-05 LAB — POCT PREGNANCY, URINE: Preg Test, Ur: NEGATIVE

## 2014-12-05 MED ORDER — OXYCODONE-ACETAMINOPHEN 5-325 MG PO TABS
2.0000 | ORAL_TABLET | Freq: Once | ORAL | Status: AC
  Administered 2014-12-05: 2 via ORAL
  Filled 2014-12-05: qty 2

## 2014-12-05 MED ORDER — OXYCODONE-ACETAMINOPHEN 10-325 MG PO TABS: 1.0000 | ORAL_TABLET | Freq: Four times a day (QID) | ORAL | Status: DC | PRN

## 2014-12-05 NOTE — MAU Note (Signed)
Pt states she is scheduled to have a hysterectomy on 06/21, was seen here last week due to heavy bleeding and was prescribed meds and has been taking them as prescribed and the bleeding stopped until today and it is heavy and she is passing blood clots. Lower abd cramping.

## 2014-12-05 NOTE — Discharge Instructions (Signed)
Abnormal Uterine Bleeding Abnormal uterine bleeding can affect women at various stages in life, including teenagers, women in their reproductive years, pregnant women, and women who have reached menopause. Several kinds of uterine bleeding are considered abnormal, including:  Bleeding or spotting between periods.   Bleeding after sexual intercourse.   Bleeding that is heavier or more than normal.   Periods that last longer than usual.  Bleeding after menopause.  Many cases of abnormal uterine bleeding are minor and simple to treat, while others are more serious. Any type of abnormal bleeding should be evaluated by your health care provider. Treatment will depend on the cause of the bleeding. HOME CARE INSTRUCTIONS Monitor your condition for any changes. The following actions may help to alleviate any discomfort you are experiencing:  Avoid the use of tampons and douches as directed by your health care provider.  Change your pads frequently. You should get regular pelvic exams and Pap tests. Keep all follow-up appointments for diagnostic tests as directed by your health care provider.  SEEK MEDICAL CARE IF:   Your bleeding lasts more than 1 week.   You feel dizzy at times.  SEEK IMMEDIATE MEDICAL CARE IF:   You pass out.   You are changing pads every 15 to 30 minutes.   You have abdominal pain.  You have a fever.   You become sweaty or weak.   You are passing large blood clots from the vagina.   You start to feel nauseous and vomit. MAKE SURE YOU:   Understand these instructions.  Will watch your condition.  Will get help right away if you are not doing well or get worse. Document Released: 06/28/2005 Document Revised: 07/03/2013 Document Reviewed: 01/25/2013 [employer] Patient Information 2015 [employer]. This information is not intended to replace advice given to you by your health care provider. Make sure you discuss any questions you have with your  health care provider.  

## 2014-12-05 NOTE — Progress Notes (Signed)
Patient states taking Megace for abdominal vaginal bleeding has taken it twice today, passing blood clots.

## 2014-12-05 NOTE — MAU Provider Note (Signed)
History     CSN: [id number]  Arrival date and time: 12/05/14 1924   First Provider Initiated Contact with Patient 12/05/14 1957      No chief complaint on file.  Vaginal Bleeding The patient's primary symptoms include vaginal bleeding. This is a chronic problem. The current episode started today. The problem occurs constantly. The problem has been gradually worsening. The pain is moderate. She is not pregnant. Associated symptoms include abdominal pain. Pertinent negatives include no fever, nausea or vomiting. The vaginal discharge was bloody. The vaginal bleeding is heavier than menses. She has been passing clots. She has not been passing tissue. Nothing aggravates the symptoms. Treatments tried: megace 80 mg today. The treatment provided no relief. Her past medical history is significant for menorrhagia and metrorrhagia.   44 y.o. [id number] presents to the MAU because of heavy vaginal bleeding that started today. States she came to the ER last week for vaginal bleeding but it is worse today. She is scheduled for a hysterectomy on June 21. She has been taking Megace 40 mg /day for several months. She took a double dose today 80mg . She is not expereincing any dizziness, palpitations.  Past Medical History  Diagnosis Date  . Asthma   . Obesity   . Seasonal allergies   . Diverticulitis   . Ovarian cyst   . Infection     trich  . Hypertension     Past Surgical History  Procedure Laterality Date  . Tubal ligation    . Cesarean section    . Forehead reconstruction      as a child    Family History  Problem Relation Age of Onset  . Diabetes Mother   . Heart disease Mother     chf  . Hypertension Father     History  Substance Use Topics  . Smoking status: Current Some Day Smoker -- 0.25 packs/day for 8 years    Types: Cigarettes  . Smokeless tobacco: Never Used     Comment: 3-4 cigs per day  . Alcohol Use: 0.0 oz/week    Allergies:  Allergies  Allergen Reactions  .  Ibuprofen Shortness Of Breath  . Azithromycin Hives and Rash  . Other Itching and Rash    "[place]"    Prescriptions prior to admission  Medication Sig Dispense Refill Last Dose  . acetaminophen (TYLENOL) 500 MG tablet Take 1,000 mg by mouth every 8 (eight) hours as needed for mild pain (pain).    11/26/2014 at Unknown time  . albuterol (PROVENTIL HFA;VENTOLIN HFA) 108 (90 BASE) MCG/ACT inhaler Inhale 2 puffs into the lungs every 6 (six) hours as needed for wheezing or shortness of breath. 1 Inhaler 2 11/27/2014 at Unknown time  . hydrochlorothiazide (HYDRODIURIL) 25 MG tablet Take 1 tablet (25 mg total) by mouth daily. 90 tablet 3 11/27/2014 at Unknown time  . IRON PO Take 1 tablet by mouth daily.   11/26/2014 at Unknown time  . megestrol (MEGACE) 20 MG tablet Take 2 tablets (40 mg total) by mouth daily. 60 tablet 1   . mometasone-formoterol (DULERA) 200-5 MCG/ACT AERO Inhale 2 puffs into the lungs 2 (two) times daily. 1 Inhaler 0 11/27/2014 at Unknown time  . traMADol (ULTRAM) 50 MG tablet Take 1 tablet (50 mg total) by mouth every 6 (six) hours as needed. 15 tablet 0     Review of Systems  Constitutional: Negative for fever.  Gastrointestinal: Positive for abdominal pain. Negative for nausea and vomiting.  Genitourinary: Positive  for vaginal bleeding and menorrhagia.       Heavy vaginal bleeding  All other systems reviewed and are negative.  Physical Exam   Blood pressure 146/88, pulse 97, temperature 98.9 F (37.2 C), temperature source Oral, resp. rate 18, height 5' 3.5" (1.613 m), weight 107.502 kg (237 lb), last menstrual period 12/05/2014, SpO2 98 %. Results for orders placed or performed during the hospital encounter of 12/05/14 (from the past 24 hour(s))  Urinalysis, Routine w reflex microscopic     Status: Abnormal   Collection Time: 12/05/14  7:46 PM  Result Value Ref Range   Color, Urine YELLOW YELLOW   APPearance CLEAR CLEAR   Specific Gravity, Urine >1.030 (H)  1.005 - 1.030   pH 5.5 5.0 - 8.0   Glucose, UA NEGATIVE NEGATIVE mg/dL   Hgb urine dipstick LARGE (A) NEGATIVE   Bilirubin Urine NEGATIVE NEGATIVE   Ketones, ur NEGATIVE NEGATIVE mg/dL   Protein, ur NEGATIVE NEGATIVE mg/dL   Urobilinogen, UA 0.2 0.0 - 1.0 mg/dL   Nitrite NEGATIVE NEGATIVE   Leukocytes, UA TRACE (A) NEGATIVE  Urine microscopic-add on     Status: Abnormal   Collection Time: 12/05/14  7:46 PM  Result Value Ref Range   Squamous Epithelial / LPF FEW (A) RARE   WBC, UA 0-2 <3 WBC/hpf   RBC / HPF TOO NUMEROUS TO COUNT <3 RBC/hpf   Bacteria, UA RARE RARE  Pregnancy, urine POC     Status: None   Collection Time: 12/05/14  7:53 PM  Result Value Ref Range   Preg Test, Ur NEGATIVE NEGATIVE  CBC     Status: Abnormal   Collection Time: 12/05/14  8:13 PM  Result Value Ref Range   WBC 11.5 (H) 4.0 - 10.5 K/uL   RBC 4.33 3.87 - 5.11 MIL/uL   Hemoglobin 10.2 (L) 12.0 - 15.0 g/dL   HCT 31.4 (L) 36.0 - 46.0 %   MCV 72.5 (L) 78.0 - 100.0 fL   MCH 23.6 (L) 26.0 - 34.0 pg   MCHC 32.5 30.0 - 36.0 g/dL   RDW 24.3 (H) 11.5 - 15.5 %   Platelets 340 150 - 400 K/uL    Physical Exam  Nursing note and vitals reviewed. Constitutional: She is oriented to person, place, and time. She appears well-developed and well-nourished. She appears distressed.  HENT:  Head: Normocephalic and atraumatic.  Neck: Normal range of motion.  Cardiovascular: Normal rate.   Respiratory: Effort normal. No respiratory distress.  GI: Soft. She exhibits no distension and no mass. There is tenderness. There is no rebound and no guarding.  Genitourinary: There is no rash, tenderness, lesion or injury on the right labia. There is no rash, tenderness, lesion or injury on the left labia. Uterus is enlarged. Cervix exhibits no motion tenderness, no discharge and no friability. Right adnexum displays no mass, no tenderness and no fullness. Left adnexum displays no mass, no tenderness and no fullness. There is bleeding  in the vagina. No erythema or tenderness in the vagina. No foreign body around the vagina. No signs of injury around the vagina. Vaginal discharge found.  Small blood clot removed from vagina. No active vaginal bleeding coming from cervix  Musculoskeletal: Normal range of motion. She exhibits no edema.  Neurological: She is alert and oriented to person, place, and time.  Skin: Skin is warm and dry.  Psychiatric: She has a normal mood and affect. Her behavior is normal. Judgment and thought content normal.    MAU Course  Procedures  MDM Pt normally takes Megace 40mg /day but doubled her dose today because she started passing blood clots. Her last dose was 300pm. She appears to be expereincing some moderate pain in her abdomen. Her surgery is scheduled in approximately 3 weeks. Upon pelvic exam, her bleeding was not as severe as reported. She did have  A small blood clot in her vagina which was removed with a large cotton swab, but no active bleeding coming from her cervix at that time. Will look as CBC to make sure pt is stable and give percocet to manage her pain. If stable will discharge to home and proceed with her scheduled surgery in June.  Assessment and Plan  Abnormal Uterine Bleeding  Continue Megace 40mg /day Continue Iron Percocet 5/325mg  RX Follow up with [facility] as scheduled   Clemmons,Lori Grissett 12/05/2014, 8:22 PM

## 2014-12-12 ENCOUNTER — Ambulatory Visit (INDEPENDENT_AMBULATORY_CARE_PROVIDER_SITE_OTHER): Payer: Self-pay | Admitting: Obstetrics & Gynecology

## 2014-12-12 ENCOUNTER — Encounter: Payer: Self-pay | Admitting: Obstetrics & Gynecology

## 2014-12-12 ENCOUNTER — Other Ambulatory Visit (HOSPITAL_COMMUNITY)
Admission: RE | Admit: 2014-12-12 | Discharge: 2014-12-12 | Disposition: A | Discharge status: Home / Self Care | Payer: Self-pay | Source: Ambulatory Visit | Attending: Obstetrics & Gynecology | Admitting: Obstetrics & Gynecology

## 2014-12-12 VITALS — BP 147/94 | HR 92 | Temp 98.4°F | Ht 62.0 in | Wt 235.3 lb

## 2014-12-12 DIAGNOSIS — D251 Intramural leiomyoma of uterus: Secondary | ICD-10-CM

## 2014-12-12 DIAGNOSIS — Z3202 Encounter for pregnancy test, result negative: Secondary | ICD-10-CM

## 2014-12-12 DIAGNOSIS — Z01812 Encounter for preprocedural laboratory examination: Secondary | ICD-10-CM

## 2014-12-12 DIAGNOSIS — D270 Benign neoplasm of right ovary: Secondary | ICD-10-CM

## 2014-12-12 DIAGNOSIS — N921 Excessive and frequent menstruation with irregular cycle: Secondary | ICD-10-CM

## 2014-12-12 DIAGNOSIS — Z1151 Encounter for screening for human papillomavirus (HPV): Secondary | ICD-10-CM

## 2014-12-12 DIAGNOSIS — N920 Excessive and frequent menstruation with regular cycle: Secondary | ICD-10-CM | POA: Insufficient documentation

## 2014-12-12 DIAGNOSIS — Z124 Encounter for screening for malignant neoplasm of cervix: Secondary | ICD-10-CM

## 2014-12-12 LAB — POCT PREGNANCY, URINE: PREG TEST UR: NEGATIVE

## 2014-12-12 NOTE — Progress Notes (Signed)
Patient ID: Anne Schaefer, female   DOB: 28-Aug-1970, 44 y.o.   MRN: [id number] Patient's last menstrual period was 12/05/2014. Scheduled for [facility] RSO for menorrhagia and fibroids and right dermoid in 18 days. Pre op EMBX and pap today, procedure and risks explained   Patient given informed consent, signed copy in the chart, time out was performed. Appropriate time out taken. . The patient was placed in the lithotomy position and the cervix brought into view with sterile speculum. Pap test obtained.  Portio of cervix cleansed x 2 with betadine swabs.  The uterus was sounded for depth of 11 cm. A pipelle was introduced to into the uterus, suction created,  and an endometrial sample was obtained. All equipment was removed and accounted for.  The patient tolerated the procedure well.    Patient given post procedure instructions. The patient will have surgery 2 weeks from now  Woodroe Mode, MD 12/12/2014

## 2014-12-12 NOTE — Patient Instructions (Signed)
Abdominal Hysterectomy Abdominal hysterectomy is a surgical procedure to remove your womb (uterus). Your uterus is the muscular organ that contains a developing baby. This surgery is done for many reasons. You may need an abdominal hysterectomy if you have cancer, growths (tumors), long-term pain, or bleeding. You may also have this procedure if your uterus has slipped down into your vagina (uterine prolapse). Depending on why you need an abdominal hysterectomy, you may also have other reproductive organs removed. These could include the part of your vagina that connects with your uterus (cervix), the organs that make eggs (ovaries), and the tubes that connect the ovaries to the uterus (fallopian tubes). LET [facility] CARE PROVIDER KNOW ABOUT:   Any allergies you have.  All medicines you are taking, including vitamins, herbs, eye drops, creams, and over-the-counter medicines.  Previous problems you or members of your family have had with the use of anesthetics.  Any blood disorders you have.  Previous surgeries you have had.  Medical conditions you have. RISKS AND COMPLICATIONS Generally, this is a safe procedure. However, as with any procedure, problems can occur. Infection is the most common problem after an abdominal hysterectomy. Other possible problems include:  Bleeding.  Formation of blood clots that may break free and travel to your lungs.  Injury to other organs near your uterus.  Nerve injury causing nerve pain.  Decreased interest in sex or pain during sexual intercourse. BEFORE THE PROCEDURE  Abdominal hysterectomy is a major surgical procedure. It can affect the way you feel about yourself. Talk to your health care provider about the physical and emotional changes hysterectomy may cause.  You may need to have blood work and X-rays done before surgery.  Quit smoking if you smoke. Ask your health care provider for help if you are struggling to quit.  Stop taking  medicines that thin your blood as directed by your health care provider.  You may be instructed to take antibiotic medicines or laxatives before surgery.  Do not eat or drink anything for 6-8 hours before surgery.  Take your regular medicines with a small sip of water.  Bathe or shower the night or morning before surgery. PROCEDURE  Abdominal hysterectomy is done in the operating room at the hospital.  In most cases, you will be given a medicine that makes you go to sleep (general anesthetic).  The surgeon will make a cut (incision) through the skin in your lower belly.  The incision may be about 5-7 inches long. It may go side-to-side or up-and-down.  The surgeon will move aside the body tissue that covers your uterus. The surgeon will then carefully take out your uterus along with any of your other reproductive organs that need to be removed.  Bleeding will be controlled with clamps or sutures.  The surgeon will close your incision with sutures or metal clips. AFTER THE PROCEDURE  You will have some pain immediately after the procedure.  You will be given pain medicine in the recovery room.  You will be taken to your hospital room when you have recovered from the anesthesia.  You may need to stay in the hospital for 2-5 days.  You will be given instructions for recovery at home. Document Released: 07/03/2013 Document Reviewed: 07/03/2013 [facility] Patient Information 2015 [place], [place]. This information is not intended to replace advice given to you by your health care provider. Make sure you discuss any questions you have with your health care provider.

## 2014-12-16 LAB — CYTOLOGY - PAP

## 2014-12-18 NOTE — Patient Instructions (Signed)
Your procedure is scheduled on:  TUESDAY, December 31, 2014  Enter through the Main Entrance of [facility] at:  11:30 a.m.  Pick up the phone at the desk and dial [phone].  Call this number if you have problems the morning of surgery: [phone].  Remember: Do NOT eat food:  AFTER MIDNIGHT Monday, December 30, 2014 Do NOT drink clear liquids after:  AFTER 9:00 A.M. DAY OF SURGERY Take these medicines the morning of surgery with a SIP OF WATER:  HYDROCHLOROTHIAZIDE *BRING ASTHMA INHALERS DAY OF SURGERY  Do NOT wear jewelry (body piercing), metal hair clips/bobby pins, make-up, or nail polish. Do NOT wear lotions, powders, or perfumes.  You may wear deoderant. Do NOT shave for 48 hours prior to surgery. Do NOT bring valuables to the hospital. Contacts, dentures, or bridgework may not be worn into surgery. Leave suitcase in car.  After surgery it may be brought to your room.  For patients admitted to the hospital, checkout time is 11:00 AM the day of discharge.

## 2014-12-19 ENCOUNTER — Encounter (HOSPITAL_COMMUNITY)
Admission: RE | Admit: 2014-12-19 | Discharge: 2014-12-19 | Disposition: A | Discharge status: Home / Self Care | Payer: Self-pay | Source: Ambulatory Visit | Attending: Obstetrics & Gynecology | Admitting: Obstetrics & Gynecology

## 2014-12-19 ENCOUNTER — Encounter (HOSPITAL_COMMUNITY): Payer: Self-pay

## 2014-12-19 DIAGNOSIS — Z01818 Encounter for other preprocedural examination: Secondary | ICD-10-CM | POA: Insufficient documentation

## 2014-12-19 HISTORY — DX: Headache: R51

## 2014-12-19 HISTORY — DX: Anemia, unspecified: D64.9

## 2014-12-19 HISTORY — DX: Headache, unspecified: R51.9

## 2014-12-19 HISTORY — DX: Pneumonia, unspecified organism: J18.9

## 2014-12-19 HISTORY — DX: Personal history of other medical treatment: Z92.89

## 2014-12-19 LAB — BASIC METABOLIC PANEL
ANION GAP: 6 (ref 5–15)
BUN: 9 mg/dL (ref 6–20)
CO2: 22 mmol/L (ref 22–32)
Calcium: 9 mg/dL (ref 8.9–10.3)
Chloride: 110 mmol/L (ref 101–111)
Creatinine, Ser: 0.88 mg/dL (ref 0.44–1.00)
GFR calc Af Amer: >60 mL/min (ref >60)
GFR calc non Af Amer: >60 mL/min (ref >60)
Glucose, Bld: 97 mg/dL (ref 65–99)
Potassium: 3.8 mmol/L (ref 3.5–5.1)
Sodium: 138 mmol/L (ref 135–145)

## 2014-12-19 LAB — CBC
HEMATOCRIT: 32.8 % — AB (ref 36.0–46.0)
Hemoglobin: 10.7 g/dL — ABNORMAL LOW (ref 12.0–15.0)
MCH: 23.8 pg — ABNORMAL LOW (ref 26.0–34.0)
MCHC: 32.6 g/dL (ref 30.0–36.0)
MCV: 73.1 fL — ABNORMAL LOW (ref 78.0–100.0)
Platelets: 361 10*3/uL (ref 150–400)
RBC: 4.49 MIL/uL (ref 3.87–5.11)
RDW: 22.2 % — ABNORMAL HIGH (ref 11.5–15.5)
WBC: 7.9 10*3/uL (ref 4.0–10.5)

## 2014-12-31 ENCOUNTER — Encounter (HOSPITAL_COMMUNITY)
Admission: RE | Disposition: A | Discharge status: Home / Self Care | Payer: Self-pay | Source: Ambulatory Visit | Attending: Obstetrics & Gynecology

## 2014-12-31 ENCOUNTER — Inpatient Hospital Stay (HOSPITAL_COMMUNITY)
Admission: RE | Admit: 2014-12-31 | Discharge: 2015-01-02 | DRG: 742 | Disposition: A | Discharge status: Home / Self Care | Payer: Self-pay | Source: Ambulatory Visit | Attending: Obstetrics & Gynecology | Admitting: Obstetrics & Gynecology

## 2014-12-31 ENCOUNTER — Inpatient Hospital Stay (HOSPITAL_COMMUNITY): Payer: Self-pay | Admitting: Anesthesiology

## 2014-12-31 ENCOUNTER — Encounter (HOSPITAL_COMMUNITY): Payer: Self-pay

## 2014-12-31 DIAGNOSIS — D251 Intramural leiomyoma of uterus: Secondary | ICD-10-CM

## 2014-12-31 DIAGNOSIS — Z886 Allergy status to analgesic agent status: Secondary | ICD-10-CM

## 2014-12-31 DIAGNOSIS — Z79899 Other long term (current) drug therapy: Secondary | ICD-10-CM

## 2014-12-31 DIAGNOSIS — Z91048 Other nonmedicinal substance allergy status: Secondary | ICD-10-CM

## 2014-12-31 DIAGNOSIS — D270 Benign neoplasm of right ovary: Secondary | ICD-10-CM

## 2014-12-31 DIAGNOSIS — I10 Essential (primary) hypertension: Secondary | ICD-10-CM

## 2014-12-31 DIAGNOSIS — F17210 Nicotine dependence, cigarettes, uncomplicated: Secondary | ICD-10-CM | POA: Diagnosis present

## 2014-12-31 DIAGNOSIS — N920 Excessive and frequent menstruation with regular cycle: Secondary | ICD-10-CM

## 2014-12-31 DIAGNOSIS — Z8249 Family history of ischemic heart disease and other diseases of the circulatory system: Secondary | ICD-10-CM

## 2014-12-31 DIAGNOSIS — Z6841 Body Mass Index (BMI) 40.0 and over, adult: Secondary | ICD-10-CM

## 2014-12-31 DIAGNOSIS — Z881 Allergy status to other antibiotic agents status: Secondary | ICD-10-CM

## 2014-12-31 DIAGNOSIS — E669 Obesity, unspecified: Secondary | ICD-10-CM | POA: Diagnosis present

## 2014-12-31 DIAGNOSIS — J45909 Unspecified asthma, uncomplicated: Secondary | ICD-10-CM | POA: Diagnosis present

## 2014-12-31 DIAGNOSIS — Z8742 Personal history of other diseases of the female genital tract: Secondary | ICD-10-CM

## 2014-12-31 HISTORY — PX: ABDOMINAL HYSTERECTOMY: SHX81

## 2014-12-31 HISTORY — PX: SALPINGOOPHORECTOMY: SHX82

## 2014-12-31 LAB — TYPE AND SCREEN
ABO/RH(D): AB POS
Antibody Screen: NEGATIVE

## 2014-12-31 LAB — PREGNANCY, URINE: PREG TEST UR: NEGATIVE

## 2014-12-31 SURGERY — HYSTERECTOMY, ABDOMINAL
Anesthesia: General | Laterality: Right

## 2014-12-31 MED ORDER — NEOSTIGMINE METHYLSULFATE 10 MG/10ML IV SOLN
INTRAVENOUS | Status: AC
  Filled 2014-12-31: qty 1

## 2014-12-31 MED ORDER — CEFAZOLIN SODIUM-DEXTROSE 2-3 GM-% IV SOLR
INTRAVENOUS | Status: AC
  Filled 2014-12-31: qty 50

## 2014-12-31 MED ORDER — HYDROMORPHONE HCL 1 MG/ML IJ SOLN
INTRAMUSCULAR | Status: AC
  Administered 2014-12-31: 0.5 mg via INTRAVENOUS
  Filled 2014-12-31: qty 1

## 2014-12-31 MED ORDER — ROCURONIUM BROMIDE 100 MG/10ML IV SOLN
INTRAVENOUS | Status: DC | PRN
  Administered 2014-12-31: 10 mg via INTRAVENOUS
  Administered 2014-12-31: 50 mg via INTRAVENOUS

## 2014-12-31 MED ORDER — KETOROLAC TROMETHAMINE 30 MG/ML IJ SOLN
INTRAMUSCULAR | Status: DC | PRN
  Administered 2014-12-31: 30 mg via INTRAVENOUS

## 2014-12-31 MED ORDER — LACTATED RINGERS IV SOLN
INTRAVENOUS | Status: DC
  Administered 2014-12-31 (×2): via INTRAVENOUS

## 2014-12-31 MED ORDER — ACETAMINOPHEN 160 MG/5ML PO SOLN
ORAL | Status: AC
  Administered 2014-12-31: 950 mg
  Filled 2014-12-31: qty 40.6

## 2014-12-31 MED ORDER — SCOPOLAMINE 1 MG/3DAYS TD PT72
MEDICATED_PATCH | TRANSDERMAL | Status: AC
  Administered 2014-12-31: 1.5 mg via TRANSDERMAL
  Filled 2014-12-31: qty 1

## 2014-12-31 MED ORDER — ONDANSETRON HCL 4 MG/2ML IJ SOLN
INTRAMUSCULAR | Status: AC
  Filled 2014-12-31: qty 2

## 2014-12-31 MED ORDER — ONDANSETRON HCL 4 MG PO TABS: 4.0000 mg | ORAL_TABLET | Freq: Four times a day (QID) | ORAL | Status: DC | PRN

## 2014-12-31 MED ORDER — 0.9 % SODIUM CHLORIDE (POUR BTL) OPTIME
TOPICAL | Status: DC | PRN
  Administered 2014-12-31: 2000 mL

## 2014-12-31 MED ORDER — NEOSTIGMINE METHYLSULFATE 10 MG/10ML IV SOLN
INTRAVENOUS | Status: DC | PRN
  Administered 2014-12-31 (×2): 2.5 mg via INTRAVENOUS

## 2014-12-31 MED ORDER — PROPOFOL 10 MG/ML IV BOLUS
INTRAVENOUS | Status: DC | PRN
  Administered 2014-12-31: 200 mg via INTRAVENOUS

## 2014-12-31 MED ORDER — SCOPOLAMINE 1 MG/3DAYS TD PT72
1.0000 | MEDICATED_PATCH | Freq: Once | TRANSDERMAL | Status: DC
  Administered 2014-12-31: 1.5 mg via TRANSDERMAL

## 2014-12-31 MED ORDER — MIDAZOLAM HCL 2 MG/2ML IJ SOLN
INTRAMUSCULAR | Status: AC
  Filled 2014-12-31: qty 2

## 2014-12-31 MED ORDER — LIDOCAINE HCL (CARDIAC) 20 MG/ML IV SOLN
INTRAVENOUS | Status: AC
  Filled 2014-12-31: qty 5

## 2014-12-31 MED ORDER — PROPOFOL 10 MG/ML IV BOLUS
INTRAVENOUS | Status: AC
  Filled 2014-12-31: qty 20

## 2014-12-31 MED ORDER — ROCURONIUM BROMIDE 100 MG/10ML IV SOLN
INTRAVENOUS | Status: AC
  Filled 2014-12-31: qty 1

## 2014-12-31 MED ORDER — BUPIVACAINE HCL (PF) 0.5 % IJ SOLN
INTRAMUSCULAR | Status: AC
  Filled 2014-12-31: qty 30

## 2014-12-31 MED ORDER — DIPHENHYDRAMINE HCL 12.5 MG/5ML PO ELIX: 12.5000 mg | ORAL_SOLUTION | Freq: Four times a day (QID) | ORAL | Status: DC | PRN

## 2014-12-31 MED ORDER — ONDANSETRON HCL 4 MG/2ML IJ SOLN: 4.0000 mg | Freq: Four times a day (QID) | INTRAMUSCULAR | Status: DC | PRN

## 2014-12-31 MED ORDER — GLYCOPYRROLATE 0.2 MG/ML IJ SOLN
INTRAMUSCULAR | Status: DC | PRN
  Administered 2014-12-31 (×2): 0.4 mg via INTRAVENOUS

## 2014-12-31 MED ORDER — DEXAMETHASONE SODIUM PHOSPHATE 10 MG/ML IJ SOLN
INTRAMUSCULAR | Status: DC | PRN
  Administered 2014-12-31: 4 mg via INTRAVENOUS

## 2014-12-31 MED ORDER — KETOROLAC TROMETHAMINE 30 MG/ML IJ SOLN
INTRAMUSCULAR | Status: AC
  Filled 2014-12-31: qty 1

## 2014-12-31 MED ORDER — GLYCOPYRROLATE 0.2 MG/ML IJ SOLN
INTRAMUSCULAR | Status: AC
  Filled 2014-12-31: qty 4

## 2014-12-31 MED ORDER — KETOROLAC TROMETHAMINE 30 MG/ML IJ SOLN
30.0000 mg | Freq: Four times a day (QID) | INTRAMUSCULAR | Status: DC
Start: 2014-12-31 — End: 2015-01-02

## 2014-12-31 MED ORDER — HYDROMORPHONE HCL 1 MG/ML IJ SOLN
INTRAMUSCULAR | Status: AC
  Administered 2014-12-31: 0.25 mg via INTRAVENOUS
  Filled 2014-12-31: qty 1

## 2014-12-31 MED ORDER — KETOROLAC TROMETHAMINE 30 MG/ML IJ SOLN
30.0000 mg | Freq: Four times a day (QID) | INTRAMUSCULAR | Status: DC
  Administered 2014-12-31 – 2015-01-02 (×4): 30 mg via INTRAVENOUS
  Filled 2014-12-31 (×6): qty 1

## 2014-12-31 MED ORDER — LIDOCAINE HCL (CARDIAC) 20 MG/ML IV SOLN
INTRAVENOUS | Status: DC | PRN
  Administered 2014-12-31: 100 mg via INTRAVENOUS

## 2014-12-31 MED ORDER — FENTANYL CITRATE (PF) 100 MCG/2ML IJ SOLN
INTRAMUSCULAR | Status: AC
  Administered 2014-12-31: 25 ug via INTRAVENOUS
  Filled 2014-12-31: qty 2

## 2014-12-31 MED ORDER — NALOXONE HCL 0.4 MG/ML IJ SOLN: 0.4000 mg | INTRAMUSCULAR | Status: DC | PRN

## 2014-12-31 MED ORDER — FENTANYL CITRATE (PF) 100 MCG/2ML IJ SOLN
INTRAMUSCULAR | Status: AC
  Filled 2014-12-31: qty 2

## 2014-12-31 MED ORDER — HYDROMORPHONE HCL 1 MG/ML IJ SOLN
0.2500 mg | INTRAMUSCULAR | Status: AC | PRN
  Administered 2014-12-31 (×2): 0.5 mg via INTRAVENOUS
  Administered 2014-12-31 (×2): 0.25 mg via INTRAVENOUS

## 2014-12-31 MED ORDER — HYDROMORPHONE HCL 1 MG/ML IJ SOLN
INTRAMUSCULAR | Status: DC | PRN
  Administered 2014-12-31 (×2): 0.5 mg via INTRAVENOUS

## 2014-12-31 MED ORDER — OXYCODONE-ACETAMINOPHEN 5-325 MG PO TABS
1.0000 | ORAL_TABLET | ORAL | Status: DC | PRN
  Administered 2015-01-01 (×2): 2 via ORAL
  Administered 2015-01-01 – 2015-01-02 (×2): 1 via ORAL
  Filled 2014-12-31: qty 2
  Filled 2014-12-31 (×2): qty 1
  Filled 2014-12-31: qty 2

## 2014-12-31 MED ORDER — CEFAZOLIN SODIUM-DEXTROSE 2-3 GM-% IV SOLR
2.0000 g | INTRAVENOUS | Status: AC
  Administered 2014-12-31: 2 g via INTRAVENOUS

## 2014-12-31 MED ORDER — FENTANYL CITRATE (PF) 250 MCG/5ML IJ SOLN
INTRAMUSCULAR | Status: AC
  Filled 2014-12-31: qty 5

## 2014-12-31 MED ORDER — MIDAZOLAM HCL 2 MG/2ML IJ SOLN
INTRAMUSCULAR | Status: DC | PRN
  Administered 2014-12-31: 2 mg via INTRAVENOUS

## 2014-12-31 MED ORDER — ONDANSETRON HCL 4 MG/2ML IJ SOLN
INTRAMUSCULAR | Status: DC | PRN
  Administered 2014-12-31: 4 mg via INTRAVENOUS

## 2014-12-31 MED ORDER — DIPHENHYDRAMINE HCL 50 MG/ML IJ SOLN: 12.5000 mg | Freq: Four times a day (QID) | INTRAMUSCULAR | Status: DC | PRN

## 2014-12-31 MED ORDER — HYDROMORPHONE 0.3 MG/ML IV SOLN
INTRAVENOUS | Status: DC
  Administered 2014-12-31: 2.59 mg via INTRAVENOUS
  Administered 2014-12-31: 17:00:00 via INTRAVENOUS
  Administered 2015-01-01: 1.19 mg via INTRAVENOUS
  Administered 2015-01-01: 0.4 mg via INTRAVENOUS
  Administered 2015-01-01: 1.39 mg via INTRAVENOUS
  Filled 2014-12-31: qty 25

## 2014-12-31 MED ORDER — HYDROMORPHONE HCL 1 MG/ML IJ SOLN
INTRAMUSCULAR | Status: AC
  Filled 2014-12-31: qty 1

## 2014-12-31 MED ORDER — FENTANYL CITRATE (PF) 100 MCG/2ML IJ SOLN
INTRAMUSCULAR | Status: DC | PRN
  Administered 2014-12-31: 100 ug via INTRAVENOUS
  Administered 2014-12-31: 150 ug via INTRAVENOUS
  Administered 2014-12-31: 25 ug via INTRAVENOUS
  Administered 2014-12-31: 50 ug via INTRAVENOUS
  Administered 2014-12-31: 25 ug via INTRAVENOUS
  Administered 2014-12-31: 50 ug via INTRAVENOUS
  Administered 2014-12-31 (×2): 100 ug via INTRAVENOUS

## 2014-12-31 MED ORDER — SODIUM CHLORIDE 0.9 % IJ SOLN: 9.0000 mL | INTRAMUSCULAR | Status: DC | PRN

## 2014-12-31 MED ORDER — FENTANYL CITRATE (PF) 100 MCG/2ML IJ SOLN
25.0000 ug | INTRAMUSCULAR | Status: DC | PRN
  Administered 2014-12-31: 25 ug via INTRAVENOUS
  Administered 2014-12-31: 50 ug via INTRAVENOUS
  Administered 2014-12-31: 25 ug via INTRAVENOUS

## 2014-12-31 MED ORDER — LACTATED RINGERS IV SOLN: INTRAVENOUS | Status: DC

## 2014-12-31 MED ORDER — ACETAMINOPHEN 160 MG/5ML PO SOLN
975.0000 mg | Freq: Once | ORAL | Status: DC
  Filled 2014-12-31: qty 40

## 2014-12-31 MED ORDER — DEXAMETHASONE SODIUM PHOSPHATE 4 MG/ML IJ SOLN
INTRAMUSCULAR | Status: AC
  Filled 2014-12-31: qty 1

## 2014-12-31 MED ORDER — BUPIVACAINE HCL (PF) 0.5 % IJ SOLN
INTRAMUSCULAR | Status: DC | PRN
  Administered 2014-12-31: 30 mL

## 2014-12-31 SURGICAL SUPPLY — 33 items
CANISTER SUCT 3000ML (MISCELLANEOUS) ×4 IMPLANT
CLOTH BEACON ORANGE TIMEOUT ST (SAFETY) ×4 IMPLANT
CONT PATH 16OZ SNAP LID 3702 (MISCELLANEOUS) ×4 IMPLANT
DRAPE WARM FLUID 44X44 (DRAPE) ×4 IMPLANT
DRSG OPSITE POSTOP 4X10 (GAUZE/BANDAGES/DRESSINGS) ×4 IMPLANT
DURAPREP 26ML APPLICATOR (WOUND CARE) ×4 IMPLANT
GAUZE SPONGE 4X4 16PLY XRAY LF (GAUZE/BANDAGES/DRESSINGS) ×4 IMPLANT
GLOVE BIO SURGEON STRL SZ 6.5 (GLOVE) ×3 IMPLANT
GLOVE BIO SURGEONS STRL SZ 6.5 (GLOVE) ×1
GLOVE BIOGEL PI IND STRL 7.0 (GLOVE) ×6 IMPLANT
GLOVE BIOGEL PI INDICATOR 7.0 (GLOVE) ×6
GLOVE SURG SS PI 7.0 STRL IVOR (GLOVE) ×24 IMPLANT
GOWN STRL REUS W/TWL LRG LVL3 (GOWN DISPOSABLE) ×12 IMPLANT
NEEDLE HYPO 22GX1.5 SAFETY (NEEDLE) ×4 IMPLANT
NS IRRIG 1000ML POUR BTL (IV SOLUTION) ×4 IMPLANT
PACK ABDOMINAL GYN (CUSTOM PROCEDURE TRAY) ×4 IMPLANT
PAD OB MATERNITY 4.3X12.25 (PERSONAL CARE ITEMS) ×4 IMPLANT
PROTECTOR NERVE ULNAR (MISCELLANEOUS) ×4 IMPLANT
SPONGE LAP 18X18 X RAY DECT (DISPOSABLE) ×12 IMPLANT
SUT PLAIN 2 0 XLH (SUTURE) ×4 IMPLANT
SUT VIC AB 0 CT1 18XCR BRD8 (SUTURE) ×8 IMPLANT
SUT VIC AB 0 CT1 27 (SUTURE) ×2
SUT VIC AB 0 CT1 27XBRD ANBCTR (SUTURE) ×2 IMPLANT
SUT VIC AB 0 CT1 36 (SUTURE) ×12 IMPLANT
SUT VIC AB 0 CT1 8-18 (SUTURE) ×8
SUT VIC AB 2-0 CT1 27 (SUTURE) ×2
SUT VIC AB 2-0 CT1 TAPERPNT 27 (SUTURE) ×2 IMPLANT
SUT VIC AB 4-0 PS2 27 (SUTURE) ×4 IMPLANT
SUT VICRYL 0 TIES 12 18 (SUTURE) ×4 IMPLANT
SYR CONTROL 10ML LL (SYRINGE) ×4 IMPLANT
TOWEL OR 17X24 6PK STRL BLUE (TOWEL DISPOSABLE) ×8 IMPLANT
TRAY FOLEY CATH SILVER 14FR (SET/KITS/TRAYS/PACK) ×4 IMPLANT
WATER STERILE IRR 1000ML POUR (IV SOLUTION) ×4 IMPLANT

## 2014-12-31 NOTE — Anesthesia Procedure Notes (Signed)
Procedure Name: Intubation Date/Time: 12/31/2014 1:52 PM Performed by: Jonna Munro Pre-anesthesia Checklist: Patient identified, Suction available, Patient being monitored, Emergency Drugs available and Timeout performed Patient Re-evaluated:Patient Re-evaluated prior to inductionOxygen Delivery Method: Circle system utilized Preoxygenation: Pre-oxygenation with 100% oxygen Intubation Type: IV induction Ventilation: Mask ventilation without difficulty Tube size: 7.0 mm Number of attempts: 1 Airway Equipment and Method: Lighted stylet Placement Confirmation: ETT inserted through vocal cords under direct vision,  positive ETCO2 and breath sounds checked- equal and bilateral Secured at: 21 cm Tube secured with: Tape Dental Injury: Teeth and Oropharynx as per pre-operative assessment  Comments: Lighted stylet used due to poor dentition.

## 2014-12-31 NOTE — Op Note (Signed)
Hysterectomy Procedure Note  Indications: Fibroid uterus, menorrhagia, right ovarian dermoid  Pre-operative Diagnosis: Fibroid uterus, menorrhagia, right ovarian dermoid  Post-operative Diagnosis: same  Operation: Supracervical abdominal hysterectomy, right salpingo-oophorectomy  Surgeon: Emeterio Reeve   Assistants: Dr. Darron Doom  Anesthesia: General endotracheal anesthesia  ASA Class: 2  Procedure Details  The patient was seen in the Holding Room. The risks, benefits, complications, treatment options, and expected outcomes were discussed with the patient.  The patient concurred with the proposed plan, giving informed consent.  The site of surgery properly noted/marked. The patient was taken to Operating Room # 3, identified as Anne Schaefer and the procedure verified as Total abdominal hysterectomy, right salpingo-oophorectomy. A Time Out was held and the above information confirmed.  After induction of anesthesia, the patient was draped and prepped in the usual sterile manner. Pt was placed in supine position after anesthesia and draped and prepped in the usual sterile manner. Foley catheter was placed.  A transverse incision was made and carried through the subcutaneous tissue to the fascia. Fascial incision was made and extended transversely. The rectus muscles were separated. The peritoneum was identified and entered. Peritoneal incision was extended vertically.  The above findings were noted. O'Connor-O'Sullivan retractor was placed and bowel was packed away from the surgical site.   The round ligaments were identified, cut, and ligated with 0-Vicryl. The anterior peritoneal reflection was incised and the bladder was dissected off the lower uterine segment. There were dense adhesions of the bladder to the anterior lower uterine segment. The retroperitoneal space was explored and the ureters were identified bilaterally. The right infundibulo-pelvic ligament was grasped, cut, and  suture ligated with 0-Vicryl. The left utero-ovarian ligament and proximal fallopian tube were grasped, cut and suture ligated with 0-Vicryl. Hemostasis  was observed. The uterine vessels were skeletonized, then clamped, cut and suture ligated with 0-Vicryl suture. Serial pedicles of the cardinal acral ligaments were clamped, cut, and suture ligated with 0-Vicryl. Because of the dense adhesions of the bladder it was not feasible to continue to dissect the cervix further. Cautery was used to excise the uterine fundus from the cervix. Some filmy adhesions in the cul-de-sac to the right colon were lysed.. The cervical stump was oversewn with 0 Vicryl with a running locking suture.  Lavage was carried out until clear. Hemostasis was observed.  Retractor and all packing was removed from the abdomen. The peritoneum was reapproximated with 2-0 Vicryl. The fascia was approximated with running sutures of 0-Vicryl. Lavage was again carried out. 2-0 plain gut interrupted sutures were placed in the the subcutaneous layer. Hemostasis was observed. The skin was approximated with 4-0 Vicryl. A honeycomb dressing was applied.Prior to closure half percent Marcaine was infiltrated into the incision.  Instrument, sponge, and needle counts were correct prior to abdominal closure and at the conclusion of the case.   Findings: Enlarged right ovary consistent with dermoid cyst. A few adhesions of the colon to the cul-de-sac on the right area scarring of the bladder to the lower uterine segment.  Estimated Blood Loss:  200 mL         Drains:  Foley catheter         Total IV Fluids: 2000 ml         Specimens:  Uterine fundus and right fallopian tube and ovary         Implants: none         Complications:  None; patient tolerated the procedure well.  Disposition: PACU - hemodynamically stable.         Condition: stable  Attending Attestation: I was present and scrubbed for the entire procedure.   Woodroe Mode, MD 12/31/2014 4:00 PM

## 2014-12-31 NOTE — Anesthesia Postprocedure Evaluation (Signed)
Anesthesia Post Note  Patient: Anne Schaefer  Procedure(s) Performed: Procedure(s) (LRB): HYSTERECTOMY ABDOMINAL (N/A) RIGHT SALPINGO OOPHORECTOMY (Right)  Anesthesia type: General  Patient location: PACU  Post pain: Pain level controlled  Post assessment: Post-op Vital signs reviewed  Last Vitals:  Filed Vitals:   12/31/14 1630  BP: 156/82  Pulse: 88  Temp:   Resp: 24    Post vital signs: Reviewed  Level of consciousness: sedated  Complications: No apparent anesthesia complications

## 2014-12-31 NOTE — H&P (Signed)
Expand All Collapse All   Patient ID: [name], female DOB: 1970-10-10, 44 y.o. MRN: [id number]  Chief Complaint  Patient presents with  . Heavy periods, scheduled hysterectomy    HPI Anne Schaefer is a 44 y.o. female. [id number] Patient's last menstrual period was 09/18/2014. Menorrhagia, h/o dermoid, has fibroid and menorrhagia. She is scheduled for hysterectomy and removal of her dermoid. She has been transfused for anemia in the past. HPI  Past Medical History  Diagnosis Date  . Asthma   . Obesity   . Seasonal allergies   . Diverticulitis   . Ovarian cyst   . Infection     trich  . Hypertension     Past Surgical History  Procedure Laterality Date  . Tubal ligation    . Cesarean section    . Forehead reconstruction      as a child    Family History  Problem Relation Age of Onset  . Diabetes Mother   . Heart disease Mother     chf  . Hypertension Father     Social History History  Substance Use Topics  . Smoking status: Current Some Day Smoker -- 0.25 packs/day for 8 years    Types: Cigarettes  . Smokeless tobacco: Never Used     Comment: 3-4 cigs per day  . Alcohol Use: 0.0 oz/week    Allergies  Allergen Reactions  . Ibuprofen Shortness Of Breath  . Azithromycin Hives and Rash  . Other Itching and Rash    "[place] [employer]"    Current Outpatient Prescriptions  Medication Sig Dispense Refill  . acetaminophen (TYLENOL) 500 MG tablet Take 1,000 mg by mouth every 8 (eight) hours as needed for mild pain (pain).     Marland Kitchen albuterol (PROVENTIL HFA;VENTOLIN HFA) 108 (90 BASE) MCG/ACT inhaler Inhale 2 puffs into the lungs every 6 (six) hours as needed for wheezing or shortness of breath. 1 Inhaler 2  . hydrochlorothiazide (HYDRODIURIL) 25 MG tablet Take 1 tablet (25 mg total) by mouth daily. 90 tablet 3  . megestrol  (MEGACE) 40 MG tablet Take 1 tablet (40 mg total) by mouth 2 (two) times daily. Take one tablet 3 times a day for 3 days, then one tablet a day for 3 days, then one tablet daily until next appointment. 60 tablet 3  . mometasone-formoterol (DULERA) 200-5 MCG/ACT AERO Inhale 2 puffs into the lungs 2 (two) times daily. 1 Inhaler 0   No current facility-administered medications for this visit.    Review of Systems Review of Systems  Constitutional: Negative.  Respiratory: Negative.  Endocrine: Negative.  Genitourinary: Positive for menstrual problem and pelvic pain (cramps). Negative for vaginal bleeding.    Blood pressure 164/94, pulse 84, temperature 98.8 F (37.1 C), temperature source Oral, resp. rate 20, SpO2 100 %.   Physical Exam Physical Exam  Constitutional: She appears well-developed. No distress.  Pulmonary/Chest: Effort normal.  Skin: Skin is warm and dry.  Psychiatric: She has a normal mood and affect. Her behavior is normal.  Abdomen no mass, transverse scar well healed, not tender.  Data Reviewed  CLINICAL DATA: Bilateral pelvic pain. Menorrhagia. Known right ovarian dermoid.  EXAM: TRANSABDOMINAL AND TRANSVAGINAL ULTRASOUND OF PELVIS  DOPPLER ULTRASOUND OF OVARIES  TECHNIQUE: Both transabdominal and transvaginal ultrasound examinations of the pelvis were performed. Transabdominal technique was performed for global imaging of the pelvis including uterus, ovaries, adnexal regions, and pelvic cul-de-sac.  It was necessary to proceed with endovaginal exam following the transabdominal  exam to visualize the uterus and ovaries. Color and duplex Doppler ultrasound was utilized to evaluate blood flow to the ovaries.  COMPARISON: 04/17/2014  FINDINGS: Uterus  Measurements: 7.3 x 7.9 x 12.1 cm. There is an intramural fibroid measuring 3.6 cm, unchanged from 04/17/2014.  Endometrium  Thickness: 7 .4 mm. There is slight displacement  of the endometrium by the fibroid.  Right ovary  Measurements: The known right ovarian dermoid measures 4.6 x 5.0 x 7.0 cm. No normal appearing right ovarian parenchyma is visible. There is a paraovarian cyst on the right measuring up to 1.6 cm.  Left ovary  Measurements: 2.4 x 1.9 x 4.7 cm. There are 2 simple left ovarian cysts, measuring up to 2.3 cm.  Pulsed Doppler evaluation of both ovaries demonstrates normal low-resistance arterial and venous waveforms for the left ovary. There was a sub optimal vascular assessment of the right ovary, as no normal right ovarian parenchyma could be observed.  Other findings  No free fluid.  IMPRESSION: 1. 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 No normal right ovarian parenchyma visible. Nondiagnostic vascular evaluation of the right ovary. 2. Intramural uterine fibroid measuring up to 3.6 cm 3. Normal left ovary with normal perfusion on Doppler.   Electronically Signed  By: Andreas Newport M.D.  On: 09/24/2014 22:09   CBC    Component Value Date/Time   WBC 7.9 12/19/2014 1204   RBC 4.49 12/19/2014 1204   HGB 10.7* 12/19/2014 1204   HCT 32.8* 12/19/2014 1204   PLT 361 12/19/2014 1204   MCV 73.1* 12/19/2014 1204   MCH 23.8* 12/19/2014 1204   MCHC 32.6 12/19/2014 1204   RDW 22.2* 12/19/2014 1204   LYMPHSABS 1.0 10/05/2014 1343   MONOABS 0.4 10/05/2014 1343   EOSABS 0.0 10/05/2014 1343   BASOSABS 0.0 10/05/2014 1343      Assessment    Fibroid, right dermoid, menorrhagia and anemia     Plan    TAH/RSO. The procedure and the risk of anesthesia, bleeding, infection, bowel and bladder injury, transfusion were discussed and her questions were answered.          Rendon Howell    12/31/2014

## 2014-12-31 NOTE — Transfer of Care (Signed)
Immediate Anesthesia Transfer of Care Note  Patient: Anne Schaefer  Procedure(s) Performed: Procedure(s): HYSTERECTOMY ABDOMINAL (N/A) RIGHT SALPINGO OOPHORECTOMY (Right)  Patient Location: PACU  Anesthesia Type:General  Level of Consciousness: awake, alert , oriented and patient cooperative  Airway & Oxygen Therapy: Patient Spontanous Breathing and Patient connected to nasal cannula oxygen  Post-op Assessment: Report given to RN and Post -op Vital signs reviewed and stable  Post vital signs: Reviewed and stable  Last Vitals:  Filed Vitals:   12/31/14 1156  BP: 164/94  Pulse: 84  Temp: 37.1 C  Resp: 20    Complications: No apparent anesthesia complications

## 2014-12-31 NOTE — Anesthesia Preprocedure Evaluation (Addendum)
Anesthesia Evaluation  Patient identified by MRN, date of birth, ID band Patient awake    Reviewed: Allergy & Precautions, H&P , Patient's Chart, lab work & pertinent test results, reviewed documented beta blocker date and time   Airway Mallampati: III  TM Distance: >3 FB Neck ROM: full    Dental no notable dental hx. (+) Poor Dentition   Pulmonary asthma (No prednisone recently; 2-3 inhaler uses /wk) , Current Smoker,  breath sounds clear to auscultation- rhonchi  Pulmonary exam normal - wheezing      Cardiovascular hypertension, On Medications Rhythm:regular Rate:Normal     Neuro/Psych    GI/Hepatic   Endo/Other  Morbid obesity  Renal/GU      Musculoskeletal   Abdominal   Peds  Hematology  (+) anemia ,   Anesthesia Other Findings Poor dentition ; discussed poss dental injury. Pt opts for [facility] vs [facility]       Asthma    Obesity    Seasonal allergies    Diverticulitis   Hypertension Anemia   History of blood transfusion          Reproductive/Obstetrics                           Anesthesia Physical Anesthesia Plan  ASA: III  Anesthesia Plan: General   Post-op Pain Management:    Induction: Intravenous  Airway Management Planned: Oral ETT  Additional Equipment:   Intra-op Plan:   Post-operative Plan: Extubation in OR  Informed Consent: I have reviewed the patients History and Physical, chart, labs and discussed the procedure including the risks, benefits and alternatives for the proposed anesthesia with the patient or authorized representative who has indicated his/her understanding and acceptance.   Dental Advisory Given and Dental advisory given  Plan Discussed with: CRNA and Surgeon  Anesthesia Plan Comments: (  Discussed general anesthesia, including possible nausea, instrumentation of airway, sore throat,pulmonary aspiration, etc. I asked if the were any  outstanding questions, or  concerns before we proceeded. )        Anesthesia Quick Evaluation

## 2015-01-01 ENCOUNTER — Encounter (HOSPITAL_COMMUNITY): Payer: Self-pay | Admitting: Obstetrics & Gynecology

## 2015-01-01 LAB — CBC
HEMATOCRIT: 26.7 % — AB (ref 36.0–46.0)
Hemoglobin: 8.7 g/dL — ABNORMAL LOW (ref 12.0–15.0)
MCH: 24.1 pg — ABNORMAL LOW (ref 26.0–34.0)
MCHC: 32.6 g/dL (ref 30.0–36.0)
MCV: 74 fL — ABNORMAL LOW (ref 78.0–100.0)
Platelets: 349 10*3/uL (ref 150–400)
RBC: 3.61 MIL/uL — ABNORMAL LOW (ref 3.87–5.11)
RDW: 20.2 % — AB (ref 11.5–15.5)
WBC: 12.7 10*3/uL — AB (ref 4.0–10.5)

## 2015-01-01 MED ORDER — PNEUMOCOCCAL VAC POLYVALENT 25 MCG/0.5ML IJ INJ
0.5000 mL | INJECTION | INTRAMUSCULAR | Status: DC
  Filled 2015-01-01: qty 0.5

## 2015-01-01 MED ORDER — LACTATED RINGERS IV SOLN
INTRAVENOUS | Status: DC
  Administered 2015-01-01: 03:00:00 via INTRAVENOUS

## 2015-01-01 NOTE — Addendum Note (Signed)
Addendum  created 01/01/15 0835 by Georgeanne Nim, CRNA   Modules edited: Notes Section   Notes Section:  File: [id number]; File: [id number]

## 2015-01-01 NOTE — Anesthesia Postprocedure Evaluation (Addendum)
  Anesthesia Post-op Note  Patient: Anne Schaefer  Procedure(s) Performed: Procedure(s): HYSTERECTOMY ABDOMINAL (N/A) RIGHT SALPINGO OOPHORECTOMY (Right)  Patient Location: [facility]  Anesthesia Type:General  Level of Consciousness: awake, alert , oriented and patient cooperative  Airway and Oxygen Therapy: Patient Spontanous Breathing and Patient connected to nasal cannula oxygen SaO2 99%  Post-op Pain: mild  Post-op Assessment: Patient's Cardiovascular Status Stable, Respiratory Function Stable, No signs of Nausea or vomiting and Pain level controlled              Post-op Vital Signs: stable  Last Vitals:  Filed Vitals:   01/01/15 0632  BP: 107/64  Pulse: 73  Temp: 37 C  Resp: 18    Complications: No apparent anesthesia complications

## 2015-01-01 NOTE — Progress Notes (Addendum)
1 Day Post-Op Procedure(s) (LRB): HYSTERECTOMY ABDOMINAL (N/A) RIGHT SALPINGO OOPHORECTOMY (Right) supracervical Subjective:cc: post op pain Patient reports incisional pain, tolerating PO, + flatus and no problems voiding.    Objective: I have reviewed patient's vital signs, medications and labs.  General: alert, cooperative and no distress GI: soft, non-tender; bowel sounds normal; no masses,  no organomegaly and incision: clean, dry and intact Extremities: extremities normal, atraumatic, no cyanosis or edema CBC    Component Value Date/Time   WBC 12.7* 01/01/2015 0522   RBC 3.61* 01/01/2015 0522   HGB 8.7* 01/01/2015 0522   HCT 26.7* 01/01/2015 0522   PLT 349 01/01/2015 0522   MCV 74.0* 01/01/2015 0522   MCH 24.1* 01/01/2015 0522   MCHC 32.6 01/01/2015 0522   RDW 20.2* 01/01/2015 0522   LYMPHSABS 1.0 10/05/2014 1343   MONOABS 0.4 10/05/2014 1343   EOSABS 0.0 10/05/2014 1343   BASOSABS 0.0 10/05/2014 1343     Assessment: s/p Procedure(s): HYSTERECTOMY ABDOMINAL (N/A) RIGHT SALPINGO OOPHORECTOMY (Right): progressing well  Plan: Advance diet Encourage ambulation Advance to PO medication Discontinue IV fluids  LOS: 1 day    Anne Schaefer 01/01/2015, 12:44 PM

## 2015-01-02 MED ORDER — OXYCODONE-ACETAMINOPHEN 5-325 MG PO TABS: 1.0000 | ORAL_TABLET | ORAL | Status: DC | PRN

## 2015-01-02 NOTE — Discharge Summary (Signed)
Physician Discharge Summary  Patient ID: Anne Schaefer MRN: [id number] DOB/AGE: 15-Mar-1971 44 y.o.  Admit date: 12/31/2014 Discharge date: 01/02/2015  Admission Diagnoses:fibroid, dermoid right ovary, menorrhagia  Discharge Diagnoses: same Active Problems:   History of menorrhagia   Discharged Condition: good  Hospital Course:   Expand All Collapse All   Hysterectomy Procedure Note  Indications: Fibroid uterus, menorrhagia, right ovarian dermoid  Pre-operative Diagnosis: Fibroid uterus, menorrhagia, right ovarian dermoid  Post-operative Diagnosis: same  Operation: Supracervical abdominal hysterectomy, right salpingo-oophorectomy  Surgeon: Emeterio Reeve   Assistants: Dr. Darron Doom  Anesthesia: General endotracheal anesthesia  ASA Class: 2  Procedure Details  The patient was seen in the Holding Room. The risks, benefits, complications, treatment options, and expected outcomes were discussed with the patient. The patient concurred with the proposed plan, giving informed consent. The site of surgery properly noted/marked. The patient was taken to Operating Room # 3, identified as Anne Schaefer and the procedure verified as Total abdominal hysterectomy, right salpingo-oophorectomy. A Time Out was held and the above information confirmed.  After induction of anesthesia, the patient was draped and prepped in the usual sterile manner. Pt was placed in supine position after anesthesia and draped and prepped in the usual sterile manner. Foley catheter was placed.  A transverse incision was made and carried through the subcutaneous tissue to the fascia. Fascial incision was made and extended transversely. The rectus muscles were separated. The peritoneum was identified and entered. Peritoneal incision was extended vertically.  The above findings were noted. O'Connor-O'Sullivan retractor was placed and bowel was packed away from the surgical site.   The round ligaments were  identified, cut, and ligated with 0-Vicryl. The anterior peritoneal reflection was incised and the bladder was dissected off the lower uterine segment. There were dense adhesions of the bladder to the anterior lower uterine segment. The retroperitoneal space was explored and the ureters were identified bilaterally. The right infundibulo-pelvic ligament was grasped, cut, and suture ligated with 0-Vicryl. The left utero-ovarian ligament and proximal fallopian tube were grasped, cut and suture ligated with 0-Vicryl. Hemostasis  was observed. The uterine vessels were skeletonized, then clamped, cut and suture ligated with 0-Vicryl suture. Serial pedicles of the cardinal acral ligaments were clamped, cut, and suture ligated with 0-Vicryl. Because of the dense adhesions of the bladder it was not feasible to continue to dissect the cervix further. Cautery was used to excise the uterine fundus from the cervix. Some filmy adhesions in the cul-de-sac to the right colon were lysed.. The cervical stump was oversewn with 0 Vicryl with a running locking suture. Lavage was carried out until clear. Hemostasis was observed.  Retractor and all packing was removed from the abdomen. The peritoneum was reapproximated with 2-0 Vicryl. The fascia was approximated with running sutures of 0-Vicryl. Lavage was again carried out. 2-0 plain gut interrupted sutures were placed in the the subcutaneous layer. Hemostasis was observed. The skin was approximated with 4-0 Vicryl. A honeycomb dressing was applied.Prior to closure half percent Marcaine was infiltrated into the incision.  Instrument, sponge, and needle counts were correct prior to abdominal closure and at the conclusion of the case.   Findings: Enlarged right ovary consistent with dermoid cyst. A few adhesions of the colon to the cul-de-sac on the right area scarring of the bladder to the lower uterine segment.  Estimated Blood Loss: 200 mL   Drains: Foley  catheter   Total IV Fluids: 2000 ml   Specimens: Uterine fundus and right fallopian tube  and ovary   Implants: none   Complications: None; patient tolerated the procedure well.   Disposition: PACU - hemodynamically stable.   Condition: stable  Attending Attestation: I was present and scrubbed for the entire procedure.   Woodroe Mode, MD 12/31/2014 4:00 PM       Consults: None  Significant Diagnostic Studies: labs:  CBC    Component Value Date/Time   WBC 12.7* 01/01/2015 0522   RBC 3.61* 01/01/2015 0522   HGB 8.7* 01/01/2015 0522   HCT 26.7* 01/01/2015 0522   PLT 349 01/01/2015 0522   MCV 74.0* 01/01/2015 0522   MCH 24.1* 01/01/2015 0522   MCHC 32.6 01/01/2015 0522   RDW 20.2* 01/01/2015 0522   LYMPHSABS 1.0 10/05/2014 1343   MONOABS 0.4 10/05/2014 1343   EOSABS 0.0 10/05/2014 1343   BASOSABS 0.0 10/05/2014 1343      Treatments: surgery: [facility] RSO  Discharge Exam: Blood pressure 123/60, pulse 79, temperature 98.9 F (37.2 C), temperature source Oral, resp. rate 16, height 5\' 2"  (1.575 m), weight 236 lb (107.049 kg), SpO2 100 %. General appearance: alert, cooperative and no distress Resp: nl effort GI: soft, non-tender; bowel sounds normal; no masses,  no organomegaly and dressing dry intact  Disposition: 01-Home or Self Care     Medication List    STOP taking these medications        megestrol 20 MG tablet  Commonly known as:  MEGACE     oxyCODONE-acetaminophen 10-325 MG per tablet  Commonly known as:  PERCOCET  Replaced by:  oxyCODONE-acetaminophen 5-325 MG per tablet      TAKE these medications        acetaminophen 500 MG tablet  Commonly known as:  TYLENOL  Take 1,000 mg by mouth every 6 (six) hours as needed for headache.     albuterol 108 (90 BASE) MCG/ACT inhaler  Commonly known as:  PROVENTIL HFA;VENTOLIN HFA  Inhale 2 puffs into the lungs every 6 (six) hours as needed for wheezing or  shortness of breath.     diphenhydrAMINE 25 mg capsule  Commonly known as:  BENADRYL  Take 25 mg by mouth every 6 (six) hours as needed for itching or allergies.     ferrous sulfate 325 (65 FE) MG tablet  Take 325 mg by mouth daily with breakfast.     hydrochlorothiazide 25 MG tablet  Commonly known as:  HYDRODIURIL  Take 1 tablet (25 mg total) by mouth daily.     mometasone-formoterol 200-5 MCG/ACT Aero  Commonly known as:  DULERA  Inhale 2 puffs into the lungs 2 (two) times daily.     oxyCODONE-acetaminophen 5-325 MG per tablet  Commonly known as:  PERCOCET/ROXICET  Take 1-2 tablets by mouth every 4 (four) hours as needed for severe pain (moderate to severe pain (when tolerating fluids)).           Follow-up Information    Follow up with [facility] In 4 weeks.   Specialty:  Obstetrics and Gynecology   Contact information:   [address] [place] [phone]      Signed: Emeterio Reeve 01/02/2015, 10:47 AM

## 2015-01-02 NOTE — Progress Notes (Signed)
Pt verbalizes understanding of d/c instructions, medications, follow up appts, when to seek medical attention and belongings policy. Encouraged pt to check room thoroughly for belongings. Pts IV was removed without complications. Pt ambulated to main entrance accompanied by her daughter and myself. Marry Guan

## 2015-01-02 NOTE — Discharge Instructions (Signed)
Abdominal Hysterectomy, Care After Refer to this sheet in the next few weeks. These instructions provide you with information on caring for yourself after your procedure. Your health care provider may also give you more specific instructions. Your treatment has been planned according to current medical practices, but problems sometimes occur. Call your health care provider if you have any problems or questions after your procedure.  WHAT TO EXPECT AFTER THE PROCEDURE After your procedure, it is typical to have the following:  Pain.  Feeling tired.  Poor appetite.  Less interest in sex. HOME CARE INSTRUCTIONS  It takes 4-6 weeks to recover from this surgery. Make sure you follow all your health care provider's instructions. Home care instructions may include:  Take pain medicines only as directed by your health care provider. Do not take over-the-counter pain medicines without checking with your health care provider first.  Change your bandage as directed by your health care provider.  Return to your health care provider to have your sutures taken out.  Take showers instead of baths for 2-3 weeks. Ask your health care provider when it is safe to start showering.  Do not douche, use tampons, or have sexual intercourse for at least 6 weeks or until your health care provider says you can.   Follow your health care provider's advice about exercise, lifting, driving, and general activities.  Get plenty of rest and sleep.   Do not lift anything heavier than a gallon of milk (about 10 lb [4.5 kg]) for the first month after surgery.  You can resume your normal diet if your health care provider says it is okay.   Do not drink alcohol until your health care provider says you can.   If you are constipated, ask your health care provider if you can take a mild laxative.  Eating foods high in fiber may also help with constipation. Eat plenty of raw fruits and vegetables, whole grains, and  beans.  Drink enough fluids to keep your urine clear or pale yellow.   Try to have someone at home with you for the first 1-2 weeks to help around the house.  Keep all follow-up appointments. SEEK MEDICAL CARE IF:   You have chills or fever.  You have swelling, redness, or pain in the area of your incision that is getting worse.   You have pus coming from the incision.   You notice a bad smell coming from the incision or bandage.   Your incision breaks open.   You feel dizzy or light-headed.   You have pain or bleeding when you urinate.   You have persistent diarrhea.   You have persistent nausea and vomiting.   You have abnormal vaginal discharge.   You have a rash.   You have any type of abnormal reaction or develop an allergy to your medicine.   Your pain medicine is not helping.  SEEK IMMEDIATE MEDICAL CARE IF:   You have a fever and your symptoms suddenly get worse.  You have severe abdominal pain.  You have chest pain.  You have shortness of breath.  You faint.  You have pain, swelling, or redness of your leg.  You have heavy vaginal bleeding with blood clots. MAKE SURE YOU:  Understand these instructions.  Will watch your condition.  Will get help right away if you are not doing well or get worse. Document Released: 01/15/2005 Document Revised: 07/03/2013 Document Reviewed: 04/20/2013 [employer] Patient Information 2015 [employer]. This information is not intended   to replace advice given to you by your health care provider. Make sure you discuss any questions you have with your health care provider.  

## 2015-01-06 ENCOUNTER — Encounter: Payer: Self-pay | Admitting: *Deleted

## 2015-01-10 DIAGNOSIS — T148XXA Other injury of unspecified body region, initial encounter: Secondary | ICD-10-CM

## 2015-01-10 HISTORY — DX: Other injury of unspecified body region, initial encounter: T14.8XXA

## 2015-01-11 ENCOUNTER — Encounter (HOSPITAL_COMMUNITY): Payer: Self-pay | Admitting: *Deleted

## 2015-01-11 ENCOUNTER — Inpatient Hospital Stay (HOSPITAL_COMMUNITY)
Admission: AD | Admit: 2015-01-11 | Discharge: 2015-01-14 | DRG: 921 | Disposition: A | Discharge status: Home / Self Care | Payer: Self-pay | Source: Ambulatory Visit | Attending: Obstetrics & Gynecology | Admitting: Obstetrics & Gynecology

## 2015-01-11 ENCOUNTER — Inpatient Hospital Stay (HOSPITAL_COMMUNITY): Payer: Self-pay

## 2015-01-11 DIAGNOSIS — S301XXA Contusion of abdominal wall, initial encounter: Secondary | ICD-10-CM | POA: Diagnosis present

## 2015-01-11 DIAGNOSIS — T148XXA Other injury of unspecified body region, initial encounter: Secondary | ICD-10-CM

## 2015-01-11 DIAGNOSIS — J45909 Unspecified asthma, uncomplicated: Secondary | ICD-10-CM | POA: Diagnosis present

## 2015-01-11 DIAGNOSIS — K5730 Diverticulosis of large intestine without perforation or abscess without bleeding: Secondary | ICD-10-CM | POA: Diagnosis present

## 2015-01-11 DIAGNOSIS — Z90710 Acquired absence of both cervix and uterus: Secondary | ICD-10-CM

## 2015-01-11 DIAGNOSIS — Z90721 Acquired absence of ovaries, unilateral: Secondary | ICD-10-CM

## 2015-01-11 DIAGNOSIS — Z9079 Acquired absence of other genital organ(s): Secondary | ICD-10-CM

## 2015-01-11 DIAGNOSIS — L7622 Postprocedural hemorrhage and hematoma of skin and subcutaneous tissue following other procedure: Principal | ICD-10-CM | POA: Diagnosis present

## 2015-01-11 DIAGNOSIS — F17210 Nicotine dependence, cigarettes, uncomplicated: Secondary | ICD-10-CM | POA: Diagnosis present

## 2015-01-11 DIAGNOSIS — Z8249 Family history of ischemic heart disease and other diseases of the circulatory system: Secondary | ICD-10-CM

## 2015-01-11 DIAGNOSIS — D649 Anemia, unspecified: Secondary | ICD-10-CM | POA: Diagnosis present

## 2015-01-11 LAB — URINALYSIS, ROUTINE W REFLEX MICROSCOPIC
BILIRUBIN URINE: NEGATIVE
Glucose, UA: NEGATIVE mg/dL
KETONES UR: NEGATIVE mg/dL
Nitrite: NEGATIVE
PH: 5.5 (ref 5.0–8.0)
PROTEIN: NEGATIVE mg/dL
Urobilinogen, UA: 1 mg/dL (ref 0.0–1.0)

## 2015-01-11 LAB — COMPREHENSIVE METABOLIC PANEL
ALBUMIN: 3.7 g/dL (ref 3.5–5.0)
ALT: 28 U/L (ref 14–54)
AST: 29 U/L (ref 15–41)
Alkaline Phosphatase: 64 U/L (ref 38–126)
Anion gap: 5 (ref 5–15)
BILIRUBIN TOTAL: 0.7 mg/dL (ref 0.3–1.2)
BUN: 10 mg/dL (ref 6–20)
CALCIUM: 8.8 mg/dL — AB (ref 8.9–10.3)
CO2: 22 mmol/L (ref 22–32)
Chloride: 109 mmol/L (ref 101–111)
Creatinine, Ser: 0.77 mg/dL (ref 0.44–1.00)
GFR calc non Af Amer: >60 mL/min (ref >60)
Glucose, Bld: 99 mg/dL (ref 65–99)
Potassium: 3.8 mmol/L (ref 3.5–5.1)
SODIUM: 136 mmol/L (ref 135–145)
Total Protein: 7 g/dL (ref 6.5–8.1)

## 2015-01-11 LAB — CBC
HEMATOCRIT: 28.4 % — AB (ref 36.0–46.0)
Hemoglobin: 8.9 g/dL — ABNORMAL LOW (ref 12.0–15.0)
MCH: 23.7 pg — AB (ref 26.0–34.0)
MCHC: 31.3 g/dL (ref 30.0–36.0)
MCV: 75.5 fL — AB (ref 78.0–100.0)
Platelets: 325 10*3/uL (ref 150–400)
RBC: 3.76 MIL/uL — ABNORMAL LOW (ref 3.87–5.11)
RDW: 19 % — ABNORMAL HIGH (ref 11.5–15.5)
WBC: 13.8 10*3/uL — ABNORMAL HIGH (ref 4.0–10.5)

## 2015-01-11 LAB — URINE MICROSCOPIC-ADD ON

## 2015-01-11 MED ORDER — LACTATED RINGERS IV SOLN
INTRAVENOUS | Status: DC
  Administered 2015-01-11 – 2015-01-12 (×3): via INTRAVENOUS

## 2015-01-11 MED ORDER — IOHEXOL 300 MG/ML  SOLN
100.0000 mL | Freq: Once | INTRAMUSCULAR | Status: AC | PRN
  Administered 2015-01-11: 100 mL via INTRAVENOUS

## 2015-01-11 MED ORDER — HYDROMORPHONE HCL 1 MG/ML IJ SOLN
1.0000 mg | Freq: Once | INTRAMUSCULAR | Status: AC
  Administered 2015-01-11: 1 mg via INTRAMUSCULAR
  Filled 2015-01-11: qty 1

## 2015-01-11 MED ORDER — IOHEXOL 300 MG/ML  SOLN
50.0000 mL | INTRAMUSCULAR | Status: AC
  Administered 2015-01-11 (×2): 50 mL via ORAL

## 2015-01-11 NOTE — MAU Note (Signed)
Had abd hyst and R oophorectomy 6/21. Yesterday had some pain RLQ but woke up today with more pain RLQ, fever, and "knot" R Lower quad. Temp 101.7 earlier tonight.

## 2015-01-11 NOTE — MAU Provider Note (Signed)
History     CSN: [id number]  Arrival date and time: 01/11/15 2013   None     Chief Complaint  Patient presents with  . Abdominal Pain  . Fever   HPI  Pt is a 44 yo here status post a supracervical abdominal hysterectomy, right salpingo-oophorectomy on 12/31/14 by Dr. Roselie Awkward for fibroid uterus, menorrhagia, right ovarian dermoid.  Postpartum course in hospital unremarkable.  Pt here tonight with report of RLQ pain, fever, and "knot" R lower quad. Temp 101.7 earlier tonight.  Pain started shortly after going for a walk.  Pain is a 10/10.    Past Medical History  Diagnosis Date  . Asthma   . Obesity   . Seasonal allergies   . Diverticulitis   . Ovarian cyst   . Infection     trich  . Hypertension   . Pneumonia     had 2 times 1997 and 2012  . Headache     migraine in 2015  . Anemia     on Iron   . History of blood transfusion     Past Surgical History  Procedure Laterality Date  . Tubal ligation    . Cesarean section    . Forehead reconstruction      as a child  . Wisdom tooth extraction    . Abdominal hysterectomy N/A 12/31/2014    Procedure: HYSTERECTOMY ABDOMINAL;  Surgeon: Woodroe Mode, MD;  Location: [facility];  Service: Gynecology;  Laterality: N/A;  . Salpingoophorectomy Right 12/31/2014    Procedure: RIGHT SALPINGO OOPHORECTOMY;  Surgeon: Woodroe Mode, MD;  Location: [facility];  Service: Gynecology;  Laterality: Right;    Family History  Problem Relation Age of Onset  . Diabetes Mother   . Heart disease Mother     chf  . Hypertension Father     History  Substance Use Topics  . Smoking status: Current Some Day Smoker -- 0.25 packs/day for 8 years    Types: Cigarettes  . Smokeless tobacco: Never Used     Comment: 3-4 cigs per day  . Alcohol Use: 0.0 oz/week    Allergies:  Allergies  Allergen Reactions  . Ibuprofen Shortness Of Breath  . Azithromycin Hives  . Other Itching and Rash    Pt states that she is allergic to "[place] [employer]".         Prescriptions prior to admission  Medication Sig Dispense Refill Last Dose  . albuterol (PROVENTIL HFA;VENTOLIN HFA) 108 (90 BASE) MCG/ACT inhaler Inhale 2 puffs into the lungs every 6 (six) hours as needed for wheezing or shortness of breath. 1 Inhaler 2 01/11/2015 at Unknown time  . diphenhydrAMINE (BENADRYL) 25 mg capsule Take 25 mg by mouth every 6 (six) hours as needed for itching or allergies.   01/11/2015 at Unknown time  . ferrous sulfate 325 (65 FE) MG tablet Take 325 mg by mouth daily.    01/10/2015 at Unknown time  . hydrochlorothiazide (HYDRODIURIL) 25 MG tablet Take 1 tablet (25 mg total) by mouth daily. 90 tablet 3 01/10/2015 at Unknown time  . mometasone-formoterol (DULERA) 200-5 MCG/ACT AERO Inhale 2 puffs into the lungs 2 (two) times daily. 1 Inhaler 0 01/11/2015 at Unknown time  . oxyCODONE-acetaminophen (PERCOCET/ROXICET) 5-325 MG per tablet Take 1-2 tablets by mouth every 4 (four) hours as needed for severe pain (moderate to severe pain (when tolerating fluids)). (Patient taking differently: Take 1-2 tablets by mouth every 4 (four) hours as needed for severe pain. ) 30  tablet 0 01/11/2015 at 1200    Review of Systems  Constitutional: Positive for fever and chills.  Gastrointestinal: Positive for abdominal pain. Negative for nausea, vomiting, diarrhea and constipation (last BM today, normal).  Genitourinary:       Voiding without difficutly  Skin:       Abdominal mass   Physical Exam   Blood pressure 165/86, pulse 101, temperature 100 F (37.8 C), resp. rate 22, height 5\' 2"  (1.575 m), weight 108.41 kg (239 lb), last menstrual period 12/05/2014, SpO2 99 %.  Physical Exam  Constitutional: She is oriented to person, place, and time. She appears well-developed and well-nourished.  Appears uncomfortable   HENT:  Head: Normocephalic.  Neck: Normal range of motion. Neck supple.  Cardiovascular: Normal rate, regular rhythm and normal heart sounds.   Respiratory: Effort normal and  breath sounds normal.  GI: Soft. There is tenderness.  Incision site well approximated, no signs of infection; 5 cm reddened area superior to incision on right side of abdomen.  Area slightly hardened.    Genitourinary: No bleeding in the vagina.  Musculoskeletal: Normal range of motion.  Neurological: She is alert and oriented to person, place, and time. She has normal reflexes.  Skin: Skin is warm and dry.    MAU Course  Procedures CT: IMPRESSION: 1. Hematoma of the low rectus abdominus muscle extending across the midline with dimensions of 10.9 x 12.2 cm and depth of 3.4 cm. 2. No hematoma or drainable collection deep to the abdominal wall musculature. 3. Unremarkable appearances of the hysterectomy bed. 4. Mild colonic diverticulosis.   0055 Dr. Gala Romney called with CT results 0155 Dr. Gala Romney advises to give RX for percocet and follow-up in clinic with Dr. Roselie Awkward on 01/20/15; notify office if pain worsens or no improvement with meds 0210 Upon review of vitals (temp 100) and WBC > admit pt for IV antibiotics and observation  Results for orders placed or performed during the hospital encounter of 01/11/15 (from the past 24 hour(s))  Urinalysis, Routine w reflex microscopic (not at [facility])     Status: Abnormal   Collection Time: 01/11/15  8:45 PM  Result Value Ref Range   Color, Urine YELLOW YELLOW   APPearance CLEAR CLEAR   Specific Gravity, Urine >1.030 (H) 1.005 - 1.030   pH 5.5 5.0 - 8.0   Glucose, UA NEGATIVE NEGATIVE mg/dL   Hgb urine dipstick SMALL (A) NEGATIVE   Bilirubin Urine NEGATIVE NEGATIVE   Ketones, ur NEGATIVE NEGATIVE mg/dL   Protein, ur NEGATIVE NEGATIVE mg/dL   Urobilinogen, UA 1.0 0.0 - 1.0 mg/dL   Nitrite NEGATIVE NEGATIVE   Leukocytes, UA TRACE (A) NEGATIVE  Urine microscopic-add on     Status: None   Collection Time: 01/11/15  8:45 PM  Result Value Ref Range   Squamous Epithelial / LPF RARE RARE   WBC, UA 3-6 <3 WBC/hpf   Bacteria, UA RARE RARE    Urine-Other MUCOUS PRESENT   CBC     Status: Abnormal   Collection Time: 01/11/15  9:45 PM  Result Value Ref Range   WBC 13.8 (H) 4.0 - 10.5 K/uL   RBC 3.76 (L) 3.87 - 5.11 MIL/uL   Hemoglobin 8.9 (L) 12.0 - 15.0 g/dL   HCT 28.4 (L) 36.0 - 46.0 %   MCV 75.5 (L) 78.0 - 100.0 fL   MCH 23.7 (L) 26.0 - 34.0 pg   MCHC 31.3 30.0 - 36.0 g/dL   RDW 19.0 (H) 11.5 - 15.5 %  Platelets 325 150 - 400 K/uL  Comprehensive metabolic panel     Status: Abnormal   Collection Time: 01/11/15  9:45 PM  Result Value Ref Range   Sodium 136 135 - 145 mmol/L   Potassium 3.8 3.5 - 5.1 mmol/L   Chloride 109 101 - 111 mmol/L   CO2 22 22 - 32 mmol/L   Glucose, Bld 99 65 - 99 mg/dL   BUN 10 6 - 20 mg/dL   Creatinine, Ser 0.77 0.44 - 1.00 mg/dL   Calcium 8.8 (L) 8.9 - 10.3 mg/dL   Total Protein 7.0 6.5 - 8.1 g/dL   Albumin 3.7 3.5 - 5.0 g/dL   AST 29 15 - 41 U/L   ALT 28 14 - 54 U/L   Alkaline Phosphatase 64 38 - 126 U/L   Total Bilirubin 0.7 0.3 - 1.2 mg/dL   GFR calc non Af Amer >60 >60 mL/min   GFR calc Af Amer >60 >60 mL/min   Anion gap 5 5 - 15    Assessment and Plan  Post-op Hematoma  Plan: Admit to Women's Unit IV antibiotics Pain medication prn  Kathrine Haddock N 01/11/2015, 2:19 AM

## 2015-01-12 ENCOUNTER — Encounter (HOSPITAL_COMMUNITY): Payer: Self-pay | Admitting: *Deleted

## 2015-01-12 DIAGNOSIS — Z9079 Acquired absence of other genital organ(s): Secondary | ICD-10-CM

## 2015-01-12 DIAGNOSIS — S301XXA Contusion of abdominal wall, initial encounter: Secondary | ICD-10-CM

## 2015-01-12 DIAGNOSIS — Z90710 Acquired absence of both cervix and uterus: Secondary | ICD-10-CM

## 2015-01-12 DIAGNOSIS — Z90721 Acquired absence of ovaries, unilateral: Secondary | ICD-10-CM

## 2015-01-12 HISTORY — DX: Contusion of abdominal wall, initial encounter: S30.1XXA

## 2015-01-12 LAB — DIC (DISSEMINATED INTRAVASCULAR COAGULATION) PANEL
FIBRINOGEN: 635 mg/dL — AB (ref 204–475)
INR: 1.02 (ref 0.00–1.49)
PROTHROMBIN TIME: 13.6 s (ref 11.6–15.2)
aPTT: 33 seconds (ref 24–37)

## 2015-01-12 LAB — DIC (DISSEMINATED INTRAVASCULAR COAGULATION)PANEL
D-Dimer, Quant: 3.99 ug/mL-FEU — ABNORMAL HIGH (ref 0.00–0.48)
Platelets: 317 10*3/uL (ref 150–400)
Smear Review: NONE SEEN

## 2015-01-12 LAB — CBC
HEMATOCRIT: 27.4 % — AB (ref 36.0–46.0)
Hemoglobin: 8.7 g/dL — ABNORMAL LOW (ref 12.0–15.0)
MCH: 24.2 pg — AB (ref 26.0–34.0)
MCHC: 31.8 g/dL (ref 30.0–36.0)
MCV: 76.1 fL — ABNORMAL LOW (ref 78.0–100.0)
Platelets: 318 10*3/uL (ref 150–400)
RBC: 3.6 MIL/uL — AB (ref 3.87–5.11)
RDW: 19.1 % — AB (ref 11.5–15.5)
WBC: 11.9 10*3/uL — ABNORMAL HIGH (ref 4.0–10.5)

## 2015-01-12 LAB — PREPARE RBC (CROSSMATCH)

## 2015-01-12 MED ORDER — HYDROMORPHONE HCL 1 MG/ML IJ SOLN: 1.0000 mg | INTRAMUSCULAR | Status: DC | PRN

## 2015-01-12 MED ORDER — MOMETASONE FURO-FORMOTEROL FUM 200-5 MCG/ACT IN AERO
2.0000 | INHALATION_SPRAY | Freq: Two times a day (BID) | RESPIRATORY_TRACT | Status: DC
  Administered 2015-01-12 – 2015-01-14 (×5): 2 via RESPIRATORY_TRACT
  Filled 2015-01-12: qty 8.8

## 2015-01-12 MED ORDER — DIPHENHYDRAMINE HCL 25 MG PO CAPS: 25.0000 mg | ORAL_CAPSULE | Freq: Once | ORAL | Status: DC

## 2015-01-12 MED ORDER — CYCLOBENZAPRINE HCL 10 MG PO TABS
10.0000 mg | ORAL_TABLET | Freq: Three times a day (TID) | ORAL | Status: DC | PRN
  Administered 2015-01-12 – 2015-01-14 (×3): 10 mg via ORAL
  Filled 2015-01-12 (×5): qty 1

## 2015-01-12 MED ORDER — FERROUS SULFATE 325 (65 FE) MG PO TABS
325.0000 mg | ORAL_TABLET | Freq: Every day | ORAL | Status: DC
  Administered 2015-01-12 – 2015-01-14 (×3): 325 mg via ORAL
  Filled 2015-01-12 (×3): qty 1

## 2015-01-12 MED ORDER — OXYCODONE-ACETAMINOPHEN 5-325 MG PO TABS: 1.0000 | ORAL_TABLET | ORAL | Status: DC | PRN

## 2015-01-12 MED ORDER — OXYCODONE-ACETAMINOPHEN 5-325 MG PO TABS
1.0000 | ORAL_TABLET | ORAL | Status: DC | PRN
  Administered 2015-01-12 – 2015-01-14 (×10): 2 via ORAL
  Filled 2015-01-12 (×10): qty 2

## 2015-01-12 MED ORDER — HYDROCHLOROTHIAZIDE 25 MG PO TABS
25.0000 mg | ORAL_TABLET | Freq: Every day | ORAL | Status: DC
  Administered 2015-01-12 – 2015-01-14 (×3): 25 mg via ORAL
  Filled 2015-01-12 (×3): qty 1

## 2015-01-12 MED ORDER — PIPERACILLIN-TAZOBACTAM 3.375 G IVPB 30 MIN: 3.3750 g | Freq: Once | INTRAVENOUS | Status: DC

## 2015-01-12 MED ORDER — PIPERACILLIN-TAZOBACTAM 3.375 G IVPB
3.3750 g | Freq: Three times a day (TID) | INTRAVENOUS | Status: DC
  Administered 2015-01-12 – 2015-01-14 (×8): 3.375 g via INTRAVENOUS
  Filled 2015-01-12 (×10): qty 50

## 2015-01-12 MED ORDER — ALBUTEROL SULFATE (2.5 MG/3ML) 0.083% IN NEBU: 3.0000 mL | INHALATION_SOLUTION | Freq: Four times a day (QID) | RESPIRATORY_TRACT | Status: DC | PRN

## 2015-01-12 MED ORDER — LORAZEPAM 2 MG/ML IJ SOLN: 0.5000 mg | Freq: Four times a day (QID) | INTRAMUSCULAR | Status: DC | PRN

## 2015-01-12 MED ORDER — HYDROMORPHONE HCL 1 MG/ML IJ SOLN: 0.2000 mg | INTRAMUSCULAR | Status: DC | PRN

## 2015-01-12 MED ORDER — PIPERACILLIN-TAZOBACTAM 3.375 G IVPB 30 MIN: 3.3750 g | Freq: Three times a day (TID) | INTRAVENOUS | Status: DC

## 2015-01-12 MED ORDER — PRENATAL MULTIVITAMIN CH
1.0000 | ORAL_TABLET | Freq: Every day | ORAL | Status: DC
  Administered 2015-01-12 – 2015-01-14 (×3): 1 via ORAL
  Filled 2015-01-12 (×3): qty 1

## 2015-01-12 MED ORDER — HYDROMORPHONE HCL 2 MG/ML IJ SOLN
2.0000 mg | Freq: Once | INTRAMUSCULAR | Status: AC
  Administered 2015-01-12: 2 mg via INTRAVENOUS
  Filled 2015-01-12: qty 1

## 2015-01-12 MED ORDER — ACETAMINOPHEN 325 MG PO TABS: 650.0000 mg | ORAL_TABLET | Freq: Once | ORAL | Status: DC

## 2015-01-12 MED ORDER — LORAZEPAM 1 MG PO TABS
0.5000 mg | ORAL_TABLET | Freq: Four times a day (QID) | ORAL | Status: DC | PRN
  Administered 2015-01-12: 1 mg via ORAL
  Filled 2015-01-12: qty 1

## 2015-01-12 NOTE — Discharge Instructions (Signed)

## 2015-01-12 NOTE — Progress Notes (Signed)
Faculty Practice OB/GYN Attending Note  Subjective:  Called to evaluate patient with worsening pain and concern about worsening hematoma.  Patient reports increased pain around hematoma site; has taken Percocet.  She is really worried. Prior to coming to evaluate patient, CBC was ordered.  Admitted on 01/11/2015 for Rectus sheath hematoma s/p [facility] and RSO on 12/31/14   Objective:  Patient Vitals for the past 24 hrs:  BP Temp Temp src Pulse Resp SpO2 Height Weight  01/12/15 1155 120/68 mmHg 98.2 F (36.8 C) Oral 86 18 99 % - -  01/12/15 0530 117/79 mmHg 98.9 F (37.2 C) Tympanic 87 20 100 % - -  01/12/15 0254 133/74 mmHg 99 F (37.2 C) Oral 93 20 98 % - -  01/12/15 0001 132/71 mmHg 98.5 F (36.9 C) Oral 89 20 - - -  01/11/15 2201 139/76 mmHg 98.9 F (37.2 C) Oral 96 22 - - -  01/11/15 2045 165/86 mmHg 100 F (37.8 C) - - - - - -  01/11/15 2043 - - - 101 - 99 % - -  01/11/15 2041 - - - - 22 - 5\' 2"  (1.575 m) 239 lb (108.41 kg)    Gen: Patient is wincing in pain HENT: Normocephalic, atraumatic Lungs: Normal respiratory effort Heart: Regular rate noted Abdomen: Large area on right lower abdomen with deep maroon colored bruising noted measuring 24 cm horizontally and 13 cm vertically.  Tender to touch and heavy.    Ext: 2+ DTRs, no edema, no cyanosis, negative Homan's sign. TED stockings in place  CBC Latest Ref Rng 01/12/2015 (1525) 01/11/2015  WBC 4.0 - 10.5 K/uL 11.9(H) 13.8(H)  Hemoglobin 12.0 - 15.0 g/dL 8.7(L) 8.9(L)  Hematocrit 36.0 - 46.0 % 27.4(L) 28.4(L)  Platelets 150 - 400 K/uL 318 325  On 01/01/15, postoperative Hgb was 8.7   Assessment & Plan:  44 y.o. [id number] admitted for rectus sheath hematoma s/p [facility] and RSO on 12/31/14.   Patient's hemoglobin is stable, no evidence of active bleeding for now Bruising is getting bigger and patient is having more pain.  Discussed patient with General Surgeon on call (Dr. Greer Pickerel) and he feels that expectant management is the best  option.  He recommended transfusion as needed; surgery is a last resort because in many cases, no bleeding vessel is encountered and there is usually diffuse oozing from surfaces.  No role for IR as the blood is clotted and will not be able to come up via a drain.  The hematoma can take several weeks to resolve; the brusing can get worse and change colors on the surface (orange, yellow, dark green etc).  Analgesia and muscle relaxants can help with the discomfort, abdominal binder can help provide pressure.  Appreciate Dr. Dois Davenport input about this situation These recommendations were discussed with the patient.  She is on Percocet for pain and Dilaudid for breakthrough pain, Flexeril ordered. Abdominal binder ordered She is type and crossmatched for two units. Will recheck CBC in the morning. Will continue close observation.  Verita Schneiders, MD, [place] Attending [place], [facility]

## 2015-01-12 NOTE — MAU Note (Signed)
Report to 3rd floor, patient to be admitted to room 317.

## 2015-01-12 NOTE — Progress Notes (Signed)
Abd. Binder applied.

## 2015-01-12 NOTE — H&P (Signed)
History     CSN: [id number]  Arrival date and time: 01/11/15 2013   None     Chief Complaint  Patient presents with  . Abdominal Pain  . Fever   HPI  Pt is a 44 yo here status post a supracervical abdominal hysterectomy, right salpingo-oophorectomy on 12/31/14 by Dr. Roselie Awkward for fibroid uterus, menorrhagia, right ovarian dermoid.  Postpartum course in hospital unremarkable.  Pt here tonight with report of RLQ pain, fever, and "knot" R lower quad. Temp 101.7 earlier tonight.  Pain started shortly after going for a walk.  Pain is a 10/10.    Past Medical History  Diagnosis Date  . Asthma   . Obesity   . Seasonal allergies   . Diverticulitis   . Ovarian cyst   . Infection     trich  . Hypertension   . Pneumonia     had 2 times 1997 and 2012  . Headache     migraine in 2015  . Anemia     on Iron   . History of blood transfusion     Past Surgical History  Procedure Laterality Date  . Tubal ligation    . Cesarean section    . Forehead reconstruction      as a child  . Wisdom tooth extraction    . Abdominal hysterectomy N/A 12/31/2014    Procedure: HYSTERECTOMY ABDOMINAL;  Surgeon: Woodroe Mode, MD;  Location: [facility];  Service: Gynecology;  Laterality: N/A;  . Salpingoophorectomy Right 12/31/2014    Procedure: RIGHT SALPINGO OOPHORECTOMY;  Surgeon: Woodroe Mode, MD;  Location: [facility];  Service: Gynecology;  Laterality: Right;    Family History  Problem Relation Age of Onset  . Diabetes Mother   . Heart disease Mother     chf  . Hypertension Father     History  Substance Use Topics  . Smoking status: Current Some Day Smoker -- 0.25 packs/day for 8 years    Types: Cigarettes  . Smokeless tobacco: Never Used     Comment: 3-4 cigs per day  . Alcohol Use: 0.0 oz/week    Allergies:  Allergies  Allergen Reactions  . Ibuprofen Shortness Of Breath  . Azithromycin Hives  . Other Itching and Rash    Pt states that she is allergic to "[place] [employer]".         Prescriptions prior to admission  Medication Sig Dispense Refill Last Dose  . albuterol (PROVENTIL HFA;VENTOLIN HFA) 108 (90 BASE) MCG/ACT inhaler Inhale 2 puffs into the lungs every 6 (six) hours as needed for wheezing or shortness of breath. 1 Inhaler 2 01/11/2015 at Unknown time  . diphenhydrAMINE (BENADRYL) 25 mg capsule Take 25 mg by mouth every 6 (six) hours as needed for itching or allergies.   01/11/2015 at Unknown time  . ferrous sulfate 325 (65 FE) MG tablet Take 325 mg by mouth daily.    01/10/2015 at Unknown time  . hydrochlorothiazide (HYDRODIURIL) 25 MG tablet Take 1 tablet (25 mg total) by mouth daily. 90 tablet 3 01/10/2015 at Unknown time  . mometasone-formoterol (DULERA) 200-5 MCG/ACT AERO Inhale 2 puffs into the lungs 2 (two) times daily. 1 Inhaler 0 01/11/2015 at Unknown time  . oxyCODONE-acetaminophen (PERCOCET/ROXICET) 5-325 MG per tablet Take 1-2 tablets by mouth every 4 (four) hours as needed for severe pain (moderate to severe pain (when tolerating fluids)). (Patient taking differently: Take 1-2 tablets by mouth every 4 (four) hours as needed for severe pain. )  30 tablet 0 01/11/2015 at 1200    Review of Systems  Constitutional: Positive for fever and chills.  Gastrointestinal: Positive for abdominal pain. Negative for nausea, vomiting, diarrhea and constipation (last BM today, normal).  Genitourinary:       Voiding without difficutly  Skin:       Abdominal mass   Physical Exam   Blood pressure 165/86, pulse 101, temperature 100 F (37.8 C), resp. rate 22, height 5\' 2"  (1.575 m), weight 108.41 kg (239 lb), last menstrual period 12/05/2014, SpO2 99 %.  Physical Exam  Constitutional: She is oriented to person, place, and time. She appears well-developed and well-nourished.  Appears uncomfortable   HENT:  Head: Normocephalic.  Neck: Normal range of motion. Neck supple.  Cardiovascular: Normal rate, regular rhythm and normal heart sounds.   Respiratory: Effort normal and  breath sounds normal.  GI: Soft. There is tenderness.  Incision site well approximated, no signs of infection; 5 cm reddened area superior to incision on right side of abdomen.  Area slightly hardened.    Genitourinary: No bleeding in the vagina.  Musculoskeletal: Normal range of motion.  Neurological: She is alert and oriented to person, place, and time. She has normal reflexes.  Skin: Skin is warm and dry.    MAU Course  Procedures CT: IMPRESSION: 1. Hematoma of the low rectus abdominus muscle extending across the midline with dimensions of 10.9 x 12.2 cm and depth of 3.4 cm. 2. No hematoma or drainable collection deep to the abdominal wall musculature. 3. Unremarkable appearances of the hysterectomy bed. 4. Mild colonic diverticulosis.   0055 Dr. Gala Romney called with CT results 0155 Dr. Gala Romney advises to give RX for percocet and follow-up in clinic with Dr. Roselie Awkward on 01/20/15; notify office if pain worsens or no improvement with meds 0210 Upon review of vitals (temp 100) and WBC > admit pt for IV antibiotics and observation  Results for orders placed or performed during the hospital encounter of 01/11/15 (from the past 24 hour(s))  Urinalysis, Routine w reflex microscopic (not at [facility])     Status: Abnormal   Collection Time: 01/11/15  8:45 PM  Result Value Ref Range   Color, Urine YELLOW YELLOW   APPearance CLEAR CLEAR   Specific Gravity, Urine >1.030 (H) 1.005 - 1.030   pH 5.5 5.0 - 8.0   Glucose, UA NEGATIVE NEGATIVE mg/dL   Hgb urine dipstick SMALL (A) NEGATIVE   Bilirubin Urine NEGATIVE NEGATIVE   Ketones, ur NEGATIVE NEGATIVE mg/dL   Protein, ur NEGATIVE NEGATIVE mg/dL   Urobilinogen, UA 1.0 0.0 - 1.0 mg/dL   Nitrite NEGATIVE NEGATIVE   Leukocytes, UA TRACE (A) NEGATIVE  Urine microscopic-add on     Status: None   Collection Time: 01/11/15  8:45 PM  Result Value Ref Range   Squamous Epithelial / LPF RARE RARE   WBC, UA 3-6 <3 WBC/hpf   Bacteria, UA RARE RARE    Urine-Other MUCOUS PRESENT   CBC     Status: Abnormal   Collection Time: 01/11/15  9:45 PM  Result Value Ref Range   WBC 13.8 (H) 4.0 - 10.5 K/uL   RBC 3.76 (L) 3.87 - 5.11 MIL/uL   Hemoglobin 8.9 (L) 12.0 - 15.0 g/dL   HCT 28.4 (L) 36.0 - 46.0 %   MCV 75.5 (L) 78.0 - 100.0 fL   MCH 23.7 (L) 26.0 - 34.0 pg   MCHC 31.3 30.0 - 36.0 g/dL   RDW 19.0 (H) 11.5 - 15.5 %  Platelets 325 150 - 400 K/uL  Comprehensive metabolic panel     Status: Abnormal   Collection Time: 01/11/15  9:45 PM  Result Value Ref Range   Sodium 136 135 - 145 mmol/L   Potassium 3.8 3.5 - 5.1 mmol/L   Chloride 109 101 - 111 mmol/L   CO2 22 22 - 32 mmol/L   Glucose, Bld 99 65 - 99 mg/dL   BUN 10 6 - 20 mg/dL   Creatinine, Ser 0.77 0.44 - 1.00 mg/dL   Calcium 8.8 (L) 8.9 - 10.3 mg/dL   Total Protein 7.0 6.5 - 8.1 g/dL   Albumin 3.7 3.5 - 5.0 g/dL   AST 29 15 - 41 U/L   ALT 28 14 - 54 U/L   Alkaline Phosphatase 64 38 - 126 U/L   Total Bilirubin 0.7 0.3 - 1.2 mg/dL   GFR calc non Af Amer >60 >60 mL/min   GFR calc Af Amer >60 >60 mL/min   Anion gap 5 5 - 15    Assessment and Plan  Post-op Hematoma  Plan: Admit to Women's Unit IV antibiotics Pain medication prn  Kathrine Haddock N 01/11/2015, 2:19 AM

## 2015-01-13 DIAGNOSIS — S301XXD Contusion of abdominal wall, subsequent encounter: Secondary | ICD-10-CM

## 2015-01-13 LAB — CBC
HEMATOCRIT: 26.1 % — AB (ref 36.0–46.0)
Hemoglobin: 8.3 g/dL — ABNORMAL LOW (ref 12.0–15.0)
MCH: 23.9 pg — ABNORMAL LOW (ref 26.0–34.0)
MCHC: 31.8 g/dL (ref 30.0–36.0)
MCV: 75.2 fL — ABNORMAL LOW (ref 78.0–100.0)
Platelets: 307 10*3/uL (ref 150–400)
RBC: 3.47 MIL/uL — AB (ref 3.87–5.11)
RDW: 18.7 % — ABNORMAL HIGH (ref 11.5–15.5)
WBC: 12 10*3/uL — AB (ref 4.0–10.5)

## 2015-01-13 MED ORDER — KETOROLAC TROMETHAMINE 30 MG/ML IJ SOLN
30.0000 mg | Freq: Four times a day (QID) | INTRAMUSCULAR | Status: AC
  Administered 2015-01-13 – 2015-01-14 (×4): 30 mg via INTRAVENOUS
  Filled 2015-01-13 (×4): qty 1

## 2015-01-13 NOTE — Progress Notes (Signed)
Faculty Practice OB/GYN Attending Note  Subjective:  Patient was stable overnight, pain controlled with Percocet and Flexeril. Afebrile.  No other concerns.  Admitted on 01/11/2015 for Rectus sheath hematoma s/p [facility] and RSO on 12/31/14   Objective:   Patient Vitals for the past 24 hrs:  BP Temp Temp src Pulse Resp SpO2  01/13/15 0541 118/62 mmHg 98.4 F (36.9 C) Oral 86 18 96 %  01/13/15 0141 124/77 mmHg 98.8 F (37.1 C) Oral (!) 105 20 99 %  01/12/15 2132 138/65 mmHg 98.9 F (37.2 C) Oral 96 17 100 %  01/12/15 1811 (!) 123/58 mmHg 98.1 F (36.7 C) Oral 80 18 100 %  01/12/15 1155 120/68 mmHg 98.2 F (36.8 C) Oral 86 18 99 %    Gen: PNAD HENT: Normocephalic, atraumatic Lungs: Normal respiratory effort Heart: Regular rate noted Abdomen: Large area on right lower abdomen with maroon colored bruising noted measuring 28 cm horizontally and 14 cm vertically.  Mildly tender to touch, no blanching erythema  Ext: 2+ DTRs, no edema, no cyanosis, negative Homan's sign. TED stockings in place  Significant results CBC Latest Ref Rng 01/13/2015 01/12/2015 01/12/2015  WBC 4.0 - 10.5 K/uL 12.0(H) 11.9(H) 13.8 (H)  Hemoglobin 12.0 - 15.0 g/dL 8.3(L) 8.7(L) 8.9 (L)  Hematocrit 36.0 - 46.0 % 26.1(L) 27.4(L) 28.4 (L)  Platelets 150 - 400 K/uL 307 318 325  On 01/01/15, postoperative Hgb was 8.7  DIC (disseminated intravasc coag) panel     Status: Abnormal   Collection Time: 01/12/15  3:25 PM  Result Value Ref Range   Prothrombin Time 13.6 11.6 - 15.2 seconds   INR 1.02 0.00 - 1.49   aPTT 33 24 - 37 seconds   Fibrinogen 635 (H) 204 - 475 mg/dL   D-Dimer, Quant 3.99 (H) 0.00 - 0.48 ug/mL-FEU   Platelets 317 150 - 400 K/uL   Smear Review NO SCHISTOCYTES SEEN     Assessment & Plan:  44 y.o. [id number] admitted for rectus sheath hematoma s/p [facility] and RSO on 12/31/14.   Patient's hemoglobin is stable, no evidence of active bleeding for now No fevers since admission low grade temp of 100F (patient reported  101.7 at home PTA). Continue Zosyn until she is 48 hours afebrile. Continue Percocet for pain and Dilaudid for breakthrough pain, Flexeril as needed. Abdominal binder ordered as needed She is type and crossmatched for two units. Will recheck CBC in the morning. Will continue close observation, possible discharge to home tomorrow if remains stable.   Verita Schneiders, MD, [place] Attending [place], [facility]

## 2015-01-14 LAB — CBC
HEMATOCRIT: 27.1 % — AB (ref 36.0–46.0)
HEMOGLOBIN: 8.5 g/dL — AB (ref 12.0–15.0)
MCH: 23.5 pg — ABNORMAL LOW (ref 26.0–34.0)
MCHC: 31.4 g/dL (ref 30.0–36.0)
MCV: 75.1 fL — AB (ref 78.0–100.0)
Platelets: 327 10*3/uL (ref 150–400)
RBC: 3.61 MIL/uL — ABNORMAL LOW (ref 3.87–5.11)
RDW: 18.3 % — ABNORMAL HIGH (ref 11.5–15.5)
WBC: 10.7 10*3/uL — ABNORMAL HIGH (ref 4.0–10.5)

## 2015-01-14 MED ORDER — OXYCODONE-ACETAMINOPHEN 5-325 MG PO TABS: 1.0000 | ORAL_TABLET | ORAL | Status: DC | PRN

## 2015-01-14 MED ORDER — KETOROLAC TROMETHAMINE 10 MG PO TABS: 10.0000 mg | ORAL_TABLET | Freq: Four times a day (QID) | ORAL | Status: DC | PRN

## 2015-01-14 NOTE — Clinical Documentation Improvement (Signed)
MD's, NP's, and PA's  Impression on CT Scan "mild colonic diverticulosis", IV antibiotics given during hospital course please document condition in notes if it was treated during this hospital course.  Medicare rules require specification as to whether an inpatient diagnosis was present at the time of admission.    Please clarify if the following diagnosis "Mild colonic diverticulosis"  was:     Marland Kitchen Present at the time of admission . NOT present at the time of inpatient admission and it developed during the inpatient stay . Unable to clinically determine whether the condition was present on admission. . Documentation insufficient to determine if condition was present at the time of inpatient admission  Thank You, Ree Kida ,RN Clinical Documentation Specialist:  [place] [facility]

## 2015-01-14 NOTE — Progress Notes (Signed)
Patient discharged home with children... Discharge instructions reviewed with patient and she verbalized understanding, and patient also states understanding of follow up appointment scheduled in clinic on July 20th at 1415. Condition stable... No equipment... Dulera inhaler sent home with patient per pharmacy. Ambulated to car with Astrid Divine, NT.

## 2015-01-14 NOTE — Discharge Summary (Signed)
Physician Discharge Summary  Patient ID: Anne Schaefer MRN: [id number] DOB/AGE: 42-31-1972 42 y.o.  Admit date: 01/11/2015 Discharge date: 01/14/2015  Admission Diagnoses:abdominal wall hematoma post op Discharge Diagnoses: same Principal Problem:   Rectus sheath hematoma s/p hysterectomy 12/31/14 Active Problems:   S/P abdominal supracervical hysterectomy and right salpingo-oophorectomy on 12/31/14   Discharged Condition: fair  Hospital Course   Expand All Collapse All     History    CSN: [id number]  Arrival date and time: 01/11/15 2013  None    Chief Complaint  Patient presents with  . Abdominal Pain  . Fever   HPI  Pt is a 44 yo here status post a supracervical abdominal hysterectomy, right salpingo-oophorectomy on 12/31/14 by Dr. Roselie Awkward for fibroid uterus, menorrhagia, right ovarian dermoid. Postpartum course in hospital unremarkable. Pt here tonight with report of RLQ pain, fever, and "knot" R lower quad. Temp 101.7 earlier tonight. Pain started shortly after going for a walk. Pain is a 10/10.   Past Medical History  Diagnosis Date  . Asthma   . Obesity   . Seasonal allergies   . Diverticulitis   . Ovarian cyst   . Infection     trich  . Hypertension   . Pneumonia     had 2 times 1997 and 2012  . Headache     migraine in 2015  . Anemia     on Iron   . History of blood transfusion     Past Surgical History  Procedure Laterality Date  . Tubal ligation    . Cesarean section    . Forehead reconstruction      as a child  . Wisdom tooth extraction    . Abdominal hysterectomy N/A 12/31/2014    Procedure: HYSTERECTOMY ABDOMINAL; Surgeon: Woodroe Mode, MD; Location: [facility]; Service: Gynecology; Laterality: N/A;  . Salpingoophorectomy Right 12/31/2014    Procedure: RIGHT SALPINGO OOPHORECTOMY; Surgeon: Woodroe Mode, MD; Location: [facility];  Service: Gynecology; Laterality: Right;    Family History  Problem Relation Age of Onset  . Diabetes Mother   . Heart disease Mother     chf  . Hypertension Father     History  Substance Use Topics  . Smoking status: Current Some Day Smoker -- 0.25 packs/day for 8 years    Types: Cigarettes  . Smokeless tobacco: Never Used     Comment: 3-4 cigs per day  . Alcohol Use: 0.0 oz/week    Allergies:  Allergies  Allergen Reactions  . Ibuprofen Shortness Of Breath  . Azithromycin Hives  . Other Itching and Rash    Pt states that she is allergic to "[place] [employer]".     Prescriptions prior to admission  Medication Sig Dispense Refill Last Dose  . albuterol (PROVENTIL HFA;VENTOLIN HFA) 108 (90 BASE) MCG/ACT inhaler Inhale 2 puffs into the lungs every 6 (six) hours as needed for wheezing or shortness of breath. 1 Inhaler 2 01/11/2015 at Unknown time  . diphenhydrAMINE (BENADRYL) 25 mg capsule Take 25 mg by mouth every 6 (six) hours as needed for itching or allergies.   01/11/2015 at Unknown time  . ferrous sulfate 325 (65 FE) MG tablet Take 325 mg by mouth daily.    01/10/2015 at Unknown time  . hydrochlorothiazide (HYDRODIURIL) 25 MG tablet Take 1 tablet (25 mg total) by mouth daily. 90 tablet 3 01/10/2015 at Unknown time  . mometasone-formoterol (DULERA) 200-5 MCG/ACT AERO Inhale 2 puffs into the lungs 2 (two) times daily. 1  Inhaler 0 01/11/2015 at Unknown time  . oxyCODONE-acetaminophen (PERCOCET/ROXICET) 5-325 MG per tablet Take 1-2 tablets by mouth every 4 (four) hours as needed for severe pain (moderate to severe pain (when tolerating fluids)). (Patient taking differently: Take 1-2 tablets by mouth every 4 (four) hours as needed for severe pain. ) 30 tablet 0 01/11/2015 at 1200    Review of Systems  Constitutional: Positive for fever and chills.  Gastrointestinal: Positive for  abdominal pain. Negative for nausea, vomiting, diarrhea and constipation (last BM today, normal).  Genitourinary:   Voiding without difficutly  Skin:   Abdominal mass   Physical Exam   Blood pressure 165/86, pulse 101, temperature 100 F (37.8 C), resp. rate 22, height 5\' 2"  (1.575 m), weight 108.41 kg (239 lb), last menstrual period 12/05/2014, SpO2 99 %.  Physical Exam  Constitutional: She is oriented to person, place, and time. She appears well-developed and well-nourished.  Appears uncomfortable  HENT:  Head: Normocephalic.  Neck: Normal range of motion. Neck supple.  Cardiovascular: Normal rate, regular rhythm and normal heart sounds.  Respiratory: Effort normal and breath sounds normal.  GI: Soft. There is tenderness.  Incision site well approximated, no signs of infection; 5 cm reddened area superior to incision on right side of abdomen. Area slightly hardened.  Genitourinary: No bleeding in the vagina.  Musculoskeletal: Normal range of motion.  Neurological: She is alert and oriented to person, place, and time. She has normal reflexes.  Skin: Skin is warm and dry.    MAU Course  Procedures CT: IMPRESSION: 1. Hematoma of the low rectus abdominus muscle extending across the midline with dimensions of 10.9 x 12.2 cm and depth of 3.4 cm. 2. No hematoma or drainable collection deep to the abdominal wall musculature. 3. Unremarkable appearances of the hysterectomy bed. 4. Mild colonic diverticulosis.   0055 Dr. Gala Romney called with CT results 0155 Dr. Gala Romney advises to give RX for percocet and follow-up in clinic with Dr. Roselie Awkward on 01/20/15; notify office if pain worsens or no improvement with meds 0210 Upon review of vitals (temp 100) and WBC > admit pt for IV antibiotics and observation  Results for orders placed or performed during the hospital encounter of 01/11/15 (from the past 24 hour(s))  Urinalysis, Routine w reflex microscopic (not at  [facility]) Status: Abnormal   Collection Time: 01/11/15 8:45 PM  Result Value Ref Range   Color, Urine YELLOW YELLOW   APPearance CLEAR CLEAR   Specific Gravity, Urine >1.030 (H) 1.005 - 1.030   pH 5.5 5.0 - 8.0   Glucose, UA NEGATIVE NEGATIVE mg/dL   Hgb urine dipstick SMALL (A) NEGATIVE   Bilirubin Urine NEGATIVE NEGATIVE   Ketones, ur NEGATIVE NEGATIVE mg/dL   Protein, ur NEGATIVE NEGATIVE mg/dL   Urobilinogen, UA 1.0 0.0 - 1.0 mg/dL   Nitrite NEGATIVE NEGATIVE   Leukocytes, UA TRACE (A) NEGATIVE  Urine microscopic-add on Status: None   Collection Time: 01/11/15 8:45 PM  Result Value Ref Range   Squamous Epithelial / LPF RARE RARE   WBC, UA 3-6 <3 WBC/hpf   Bacteria, UA RARE RARE   Urine-Other MUCOUS PRESENT   CBC Status: Abnormal   Collection Time: 01/11/15 9:45 PM  Result Value Ref Range   WBC 13.8 (H) 4.0 - 10.5 K/uL   RBC 3.76 (L) 3.87 - 5.11 MIL/uL   Hemoglobin 8.9 (L) 12.0 - 15.0 g/dL   HCT 28.4 (L) 36.0 - 46.0 %   MCV 75.5 (L) 78.0 - 100.0 fL  MCH 23.7 (L) 26.0 - 34.0 pg   MCHC 31.3 30.0 - 36.0 g/dL   RDW 19.0 (H) 11.5 - 15.5 %   Platelets 325 150 - 400 K/uL  Comprehensive metabolic panel Status: Abnormal   Collection Time: 01/11/15 9:45 PM  Result Value Ref Range   Sodium 136 135 - 145 mmol/L   Potassium 3.8 3.5 - 5.1 mmol/L   Chloride 109 101 - 111 mmol/L   CO2 22 22 - 32 mmol/L   Glucose, Bld 99 65 - 99 mg/dL   BUN 10 6 - 20 mg/dL   Creatinine, Ser 0.77 0.44 - 1.00 mg/dL   Calcium 8.8 (L) 8.9 - 10.3 mg/dL   Total Protein 7.0 6.5 - 8.1 g/dL   Albumin 3.7 3.5 - 5.0 g/dL   AST 29 15 - 41 U/L   ALT 28 14 - 54 U/L   Alkaline Phosphatase 64 38 - 126 U/L   Total Bilirubin 0.7 0.3 - 1.2 mg/dL   GFR calc non Af Amer >60 >60 mL/min   GFR calc Af Amer >60 >60  mL/min   Anion gap 5 5 - 15    Assessment and Plan  Post-op Hematoma  Plan: Admit to Women[facility] Unit IV antibiotics Pain medication prn  Kathrine Haddock N 01/11/2015, 2:19 AM        Consults: None  Significant Diagnostic Studies: labs:  CBC    Component Value Date/Time   WBC 10.7* 01/14/2015 0616   RBC 3.61* 01/14/2015 0616   HGB 8.5* 01/14/2015 0616   HCT 27.1* 01/14/2015 0616   PLT 327 01/14/2015 0616   MCV 75.1* 01/14/2015 0616   MCH 23.5* 01/14/2015 0616   MCHC 31.4 01/14/2015 0616   RDW 18.3* 01/14/2015 0616   LYMPHSABS 1.0 10/05/2014 1343   MONOABS 0.4 10/05/2014 1343   EOSABS 0.0 10/05/2014 1343   BASOSABS 0.0 10/05/2014 1343    CT scan at admission  Treatments: IV hydration, antibiotics: Zosyn and analgesia: Toradol and Percocet  Discharge Exam: Blood pressure 103/54, pulse 79, temperature 98.2 F (36.8 C), temperature source Oral, resp. rate 18, height 5\' 2"  (1.575 m), weight 239 lb (108.41 kg), last menstrual period 12/05/2014, SpO2 100 %. General appearance: alert, cooperative and no distress Resp: normal effort GI: soft, non-tender; bowel sounds normal; no masses,  no organomegaly and bruising lower abdomen, pannous Extremities: extremities normal, atraumatic, no cyanosis or edema and Homans sign is negative, no sign of DVT  Disposition: 01-Home or Self Care     Medication List    TAKE these medications        albuterol 108 (90 BASE) MCG/ACT inhaler  Commonly known as:  PROVENTIL HFA;VENTOLIN HFA  Inhale 2 puffs into the lungs every 6 (six) hours as needed for wheezing or shortness of breath.     diphenhydrAMINE 25 mg capsule  Commonly known as:  BENADRYL  Take 25 mg by mouth every 6 (six) hours as needed for itching or allergies.     ferrous sulfate 325 (65 FE) MG tablet  Take 325 mg by mouth daily.     hydrochlorothiazide 25 MG tablet  Commonly known as:  HYDRODIURIL  Take 1 tablet (25 mg total) by mouth daily.     ketorolac 10  MG tablet  Commonly known as:  TORADOL  Take 1 tablet (10 mg total) by mouth every 6 (six) hours as needed.     mometasone-formoterol 200-5 MCG/ACT Aero  Commonly known as:  DULERA  Inhale 2 puffs into the  lungs 2 (two) times daily.     oxyCODONE-acetaminophen 5-325 MG per tablet  Commonly known as:  PERCOCET/ROXICET  Take 1-2 tablets by mouth every 4 (four) hours as needed for severe pain (moderate to severe pain (when tolerating fluids)).     oxyCODONE-acetaminophen 5-325 MG per tablet  Commonly known as:  PERCOCET/ROXICET  Take 1-2 tablets by mouth every 4 (four) hours as needed.     oxyCODONE-acetaminophen 5-325 MG per tablet  Commonly known as:  PERCOCET/ROXICET  Take 1-2 tablets by mouth every 4 (four) hours as needed for severe pain (moderate to severe pain (when tolerating fluids)).           Follow-up Information    Follow up with Huck Ashworth, MD.   Specialty:  Obstetrics and Gynecology   Why:  Someone will call with an appointment date/time.   Contact information:   [place] [address] [phone]       Follow up with [facility]. Schedule an appointment as soon as possible for a visit in 2 weeks.   Specialty:  Obstetrics and Gynecology   Contact information:   [place] [phone]      Signed: Emeterio Reeve 01/14/2015, 8:25 AM

## 2015-01-16 LAB — TYPE AND SCREEN
ABO/RH(D): AB POS
Antibody Screen: NEGATIVE
Unit division: 0
Unit division: 0

## 2015-01-29 ENCOUNTER — Encounter: Payer: Self-pay | Admitting: Obstetrics & Gynecology

## 2015-01-29 ENCOUNTER — Ambulatory Visit (INDEPENDENT_AMBULATORY_CARE_PROVIDER_SITE_OTHER): Payer: Self-pay | Admitting: Obstetrics & Gynecology

## 2015-01-29 ENCOUNTER — Other Ambulatory Visit: Payer: Self-pay | Admitting: Obstetrics & Gynecology

## 2015-01-29 VITALS — BP 144/96 | HR 89 | Temp 98.9°F | Ht 62.0 in | Wt 239.7 lb

## 2015-01-29 DIAGNOSIS — Z1231 Encounter for screening mammogram for malignant neoplasm of breast: Secondary | ICD-10-CM

## 2015-01-29 DIAGNOSIS — Z9079 Acquired absence of other genital organ(s): Secondary | ICD-10-CM

## 2015-01-29 DIAGNOSIS — Z9889 Other specified postprocedural states: Secondary | ICD-10-CM

## 2015-01-29 DIAGNOSIS — Z90710 Acquired absence of both cervix and uterus: Secondary | ICD-10-CM

## 2015-01-29 DIAGNOSIS — Z90721 Acquired absence of ovaries, unilateral: Secondary | ICD-10-CM

## 2015-01-29 NOTE — Progress Notes (Signed)
Patient ID: Anne Schaefer, female   DOB: [birth year], 44 y.o.   MRN: [id number] Subjective: feels much better   cc: was readmitted with abd wall hematoma  Anne Schaefer is a 44 y.o. female who presents to the clinic 4 weeks status post supracervical hysterectomy and right oophorectomy for abnormal uterine bleeding and adnexal mass. Eating a regular diet without difficulty. Bowel movements are normal. The patient is not having any pain.  The following portions of the patient's history were reviewed and updated as appropriate: allergies, current medications, past family history, past medical history, past social history, past surgical history and problem list.  Review of Systems Pertinent items are noted in HPI.    Objective:    BP 144/96 mmHg  Pulse 89  Temp(Src) 98.9 F (37.2 C)  Ht 5\' 2"  (1.575 m)  Wt 239 lb 11.2 oz (108.727 kg)  BMI 43.83 kg/m2  LMP 12/05/2014 General:  alert, cooperative and no distress  Abdomen: soft, non-tender, hematoma barely palpable  Incision:   healing well, no drainage, no erythema, no hernia, no seroma, incision well approximated     Assessment:    Doing well postoperatively. Operative findings again reviewed. Pathology report discussed.    Plan:    1. Continue any current medications. 2. Wound care discussed. 3. Activity restrictions: none and after 6 weeks 4. Anticipated return to work: 1-2 weeks. 5. Follow up prn  Woodroe Mode, MD 01/29/2015

## 2015-01-29 NOTE — Progress Notes (Signed)
Here for post op, had a hematoma after surgery and was readmitted.

## 2015-01-29 NOTE — Patient Instructions (Signed)
Abdominal Hysterectomy Abdominal hysterectomy is a surgical procedure to remove your womb (uterus). Your uterus is the muscular organ that contains a developing baby. This surgery is done for many reasons. You may need an abdominal hysterectomy if you have cancer, growths (tumors), long-term pain, or bleeding. You may also have this procedure if your uterus has slipped down into your vagina (uterine prolapse). Depending on why you need an abdominal hysterectomy, you may also have other reproductive organs removed. These could include the part of your vagina that connects with your uterus (cervix), the organs that make eggs (ovaries), and the tubes that connect the ovaries to the uterus (fallopian tubes). LET [facility] CARE PROVIDER KNOW ABOUT:   Any allergies you have.  All medicines you are taking, including vitamins, herbs, eye drops, creams, and over-the-counter medicines.  Previous problems you or members of your family have had with the use of anesthetics.  Any blood disorders you have.  Previous surgeries you have had.  Medical conditions you have. RISKS AND COMPLICATIONS Generally, this is a safe procedure. However, as with any procedure, problems can occur. Infection is the most common problem after an abdominal hysterectomy. Other possible problems include:  Bleeding.  Formation of blood clots that may break free and travel to your lungs.  Injury to other organs near your uterus.  Nerve injury causing nerve pain.  Decreased interest in sex or pain during sexual intercourse. BEFORE THE PROCEDURE  Abdominal hysterectomy is a major surgical procedure. It can affect the way you feel about yourself. Talk to your health care provider about the physical and emotional changes hysterectomy may cause.  You may need to have blood work and X-rays done before surgery.  Quit smoking if you smoke. Ask your health care provider for help if you are struggling to quit.  Stop taking  medicines that thin your blood as directed by your health care provider.  You may be instructed to take antibiotic medicines or laxatives before surgery.  Do not eat or drink anything for 6-8 hours before surgery.  Take your regular medicines with a small sip of water.  Bathe or shower the night or morning before surgery. PROCEDURE  Abdominal hysterectomy is done in the operating room at the hospital.  In most cases, you will be given a medicine that makes you go to sleep (general anesthetic).  The surgeon will make a cut (incision) through the skin in your lower belly.  The incision may be about 5-7 inches long. It may go side-to-side or up-and-down.  The surgeon will move aside the body tissue that covers your uterus. The surgeon will then carefully take out your uterus along with any of your other reproductive organs that need to be removed.  Bleeding will be controlled with clamps or sutures.  The surgeon will close your incision with sutures or metal clips. AFTER THE PROCEDURE  You will have some pain immediately after the procedure.  You will be given pain medicine in the recovery room.  You will be taken to your hospital room when you have recovered from the anesthesia.  You may need to stay in the hospital for 2-5 days.  You will be given instructions for recovery at home. Document Released: 07/03/2013 Document Reviewed: 07/03/2013 [facility] Patient Information 2015 [place], [place]. This information is not intended to replace advice given to you by your health care provider. Make sure you discuss any questions you have with your health care provider.

## 2015-02-04 ENCOUNTER — Ambulatory Visit (HOSPITAL_COMMUNITY)
Admission: RE | Admit: 2015-02-04 | Discharge: 2015-02-04 | Disposition: A | Discharge status: Home / Self Care | Payer: Self-pay | Source: Ambulatory Visit | Attending: Obstetrics & Gynecology | Admitting: Obstetrics & Gynecology

## 2015-02-04 DIAGNOSIS — Z1231 Encounter for screening mammogram for malignant neoplasm of breast: Secondary | ICD-10-CM

## 2015-03-19 ENCOUNTER — Encounter (HOSPITAL_COMMUNITY): Payer: Self-pay | Admitting: Family Medicine

## 2015-03-19 ENCOUNTER — Inpatient Hospital Stay (HOSPITAL_COMMUNITY)
Admission: EM | Admit: 2015-03-19 | Discharge: 2015-03-24 | DRG: 392 | Disposition: A | Discharge status: Home / Self Care | Payer: Self-pay | Attending: General Surgery | Admitting: General Surgery

## 2015-03-19 DIAGNOSIS — Z8249 Family history of ischemic heart disease and other diseases of the circulatory system: Secondary | ICD-10-CM

## 2015-03-19 DIAGNOSIS — F17210 Nicotine dependence, cigarettes, uncomplicated: Secondary | ICD-10-CM | POA: Diagnosis present

## 2015-03-19 DIAGNOSIS — N739 Female pelvic inflammatory disease, unspecified: Secondary | ICD-10-CM | POA: Diagnosis present

## 2015-03-19 DIAGNOSIS — E669 Obesity, unspecified: Secondary | ICD-10-CM | POA: Diagnosis present

## 2015-03-19 DIAGNOSIS — Z833 Family history of diabetes mellitus: Secondary | ICD-10-CM

## 2015-03-19 DIAGNOSIS — J45909 Unspecified asthma, uncomplicated: Secondary | ICD-10-CM | POA: Diagnosis present

## 2015-03-19 DIAGNOSIS — D649 Anemia, unspecified: Secondary | ICD-10-CM | POA: Diagnosis present

## 2015-03-19 DIAGNOSIS — Z90710 Acquired absence of both cervix and uterus: Secondary | ICD-10-CM

## 2015-03-19 DIAGNOSIS — F1290 Cannabis use, unspecified, uncomplicated: Secondary | ICD-10-CM | POA: Diagnosis present

## 2015-03-19 DIAGNOSIS — I10 Essential (primary) hypertension: Secondary | ICD-10-CM | POA: Diagnosis present

## 2015-03-19 DIAGNOSIS — K5720 Diverticulitis of large intestine with perforation and abscess without bleeding: Principal | ICD-10-CM | POA: Diagnosis present

## 2015-03-19 LAB — LIPASE, BLOOD: LIPASE: 17 U/L — AB (ref 22–51)

## 2015-03-19 LAB — CBC
HEMATOCRIT: 36.5 % (ref 36.0–46.0)
HEMOGLOBIN: 11.5 g/dL — AB (ref 12.0–15.0)
MCH: 23.1 pg — ABNORMAL LOW (ref 26.0–34.0)
MCHC: 31.5 g/dL (ref 30.0–36.0)
MCV: 73.3 fL — ABNORMAL LOW (ref 78.0–100.0)
Platelets: 386 10*3/uL (ref 150–400)
RBC: 4.98 MIL/uL (ref 3.87–5.11)
RDW: 17.1 % — ABNORMAL HIGH (ref 11.5–15.5)
WBC: 11.9 10*3/uL — AB (ref 4.0–10.5)

## 2015-03-19 LAB — URINALYSIS, ROUTINE W REFLEX MICROSCOPIC
Bilirubin Urine: NEGATIVE
Glucose, UA: NEGATIVE mg/dL
Hgb urine dipstick: NEGATIVE
Ketones, ur: NEGATIVE mg/dL
Leukocytes, UA: NEGATIVE
NITRITE: NEGATIVE
Protein, ur: NEGATIVE mg/dL
SPECIFIC GRAVITY, URINE: 1.021 (ref 1.005–1.030)
UROBILINOGEN UA: 0.2 mg/dL (ref 0.0–1.0)
pH: 5.5 (ref 5.0–8.0)

## 2015-03-19 LAB — COMPREHENSIVE METABOLIC PANEL
ALBUMIN: 3.8 g/dL (ref 3.5–5.0)
ALT: 13 U/L — ABNORMAL LOW (ref 14–54)
AST: 16 U/L (ref 15–41)
Alkaline Phosphatase: 58 U/L (ref 38–126)
Anion gap: 10 (ref 5–15)
BUN: 10 mg/dL (ref 6–20)
CO2: 24 mmol/L (ref 22–32)
Calcium: 9.4 mg/dL (ref 8.9–10.3)
Chloride: 102 mmol/L (ref 101–111)
Creatinine, Ser: 0.89 mg/dL (ref 0.44–1.00)
GFR calc Af Amer: >60 mL/min (ref >60)
GFR calc non Af Amer: >60 mL/min (ref >60)
GLUCOSE: 88 mg/dL (ref 65–99)
POTASSIUM: 3.6 mmol/L (ref 3.5–5.1)
Sodium: 136 mmol/L (ref 135–145)
Total Bilirubin: 0.3 mg/dL (ref 0.3–1.2)
Total Protein: 7.5 g/dL (ref 6.5–8.1)

## 2015-03-19 MED ORDER — ONDANSETRON HCL 4 MG/2ML IJ SOLN
4.0000 mg | Freq: Once | INTRAMUSCULAR | Status: AC
  Administered 2015-03-19: 4 mg via INTRAVENOUS
  Filled 2015-03-19: qty 2

## 2015-03-19 MED ORDER — OXYCODONE-ACETAMINOPHEN 5-325 MG PO TABS
ORAL_TABLET | ORAL | Status: AC
  Filled 2015-03-19: qty 1

## 2015-03-19 MED ORDER — OXYCODONE-ACETAMINOPHEN 5-325 MG PO TABS
1.0000 | ORAL_TABLET | Freq: Once | ORAL | Status: AC
  Administered 2015-03-19: 1 via ORAL

## 2015-03-19 MED ORDER — MORPHINE SULFATE (PF) 4 MG/ML IV SOLN
4.0000 mg | Freq: Once | INTRAVENOUS | Status: AC
  Administered 2015-03-19: 4 mg via INTRAVENOUS
  Filled 2015-03-19: qty 1

## 2015-03-19 NOTE — ED Provider Notes (Signed)
CSN: [id number]     Arrival date & time 03/19/15  1802 History   First MD Initiated Contact with Patient 03/19/15 2146     Chief Complaint  Patient presents with  . Abdominal Pain     (Consider location/radiation/quality/duration/timing/severity/associated sxs/prior Treatment) HPI Comments: Patient is a 44 year old female with a past medical history of hypertension and 6 weeks s/p abdominal hysterectomy who presents with lower abdominal pain that started 2 days ago. The pain is located in the lower abdomen and does not radiate. The pain is described as aching and severe. The pain started gradually and progressively worsened since the onset. No alleviating/aggravating factors. The patient has tried nothing for symptoms without relief. Associated symptoms include nausea. Patient denies fever, headache, vomiting, diarrhea, chest pain, SOB, dysuria, constipation, abnormal vaginal bleeding/discharge. Patient reports having to be readmitted to the hospital for a rectus sheath hematoma after the hysterectomy.      Past Medical History  Diagnosis Date  . Asthma   . Obesity   . Seasonal allergies   . Diverticulitis   . Ovarian cyst   . Infection     trich  . Hypertension   . Pneumonia     had 2 times 1997 and 2012  . Headache     migraine in 2015  . Anemia     on Iron   . History of blood transfusion   . Hematoma 01/2015   Past Surgical History  Procedure Laterality Date  . Tubal ligation    . Cesarean section    . Forehead reconstruction      as a child  . Wisdom tooth extraction    . Abdominal hysterectomy N/A 12/31/2014    Procedure: HYSTERECTOMY ABDOMINAL;  Surgeon: Woodroe Mode, MD;  Location: [facility];  Service: Gynecology;  Laterality: N/A;  . Salpingoophorectomy Right 12/31/2014    Procedure: RIGHT SALPINGO OOPHORECTOMY;  Surgeon: Woodroe Mode, MD;  Location: [facility];  Service: Gynecology;  Laterality: Right;   Family History  Problem Relation Age of Onset  . Diabetes  Mother   . Heart disease Mother     chf  . Hypertension Father    Social History  Substance Use Topics  . Smoking status: Current Some Day Smoker -- 0.25 packs/day for 8 years    Types: Cigarettes  . Smokeless tobacco: Never Used     Comment: 3-4 cigs per day  . Alcohol Use: 0.0 oz/week   OB History    Gravida Para Term Preterm AB TAB SAB Ectopic Multiple Living   5 5 4 1  0 0 0 0 0 5     Review of Systems  Constitutional: Negative for fever, chills and fatigue.  HENT: Negative for trouble swallowing.   Eyes: Negative for visual disturbance.  Respiratory: Negative for shortness of breath.   Cardiovascular: Negative for chest pain and palpitations.  Gastrointestinal: Positive for nausea and abdominal pain. Negative for diarrhea.  Genitourinary: Negative for dysuria and difficulty urinating.  Musculoskeletal: Negative for arthralgias and neck pain.  Skin: Negative for color change.  Neurological: Negative for dizziness and weakness.  Psychiatric/Behavioral: Negative for dysphoric mood.      Allergies  Ibuprofen; Azithromycin; and Other  Home Medications   Prior to Admission medications   Medication Sig Start Date End Date Taking? Authorizing Provider  albuterol (PROVENTIL HFA;VENTOLIN HFA) 108 (90 BASE) MCG/ACT inhaler Inhale 2 puffs into the lungs every 6 (six) hours as needed for wheezing or shortness of breath. 09/09/14  Jennifer Piepenbrink, PA-C  diphenhydrAMINE (BENADRYL) 25 mg capsule Take 25 mg by mouth every 6 (six) hours as needed for itching or allergies.    Historical Provider, MD  ferrous sulfate 325 (65 FE) MG tablet Take 325 mg by mouth daily.     Historical Provider, MD  hydrochlorothiazide (HYDRODIURIL) 25 MG tablet Take 1 tablet (25 mg total) by mouth daily. 03/08/14   Lorayne Marek, MD  mometasone-formoterol (DULERA) 200-5 MCG/ACT AERO Inhale 2 puffs into the lungs 2 (two) times daily. 09/09/14   Jennifer Piepenbrink, PA-C   BP 175/100 mmHg  Pulse 82   Temp(Src) 98.8 F (37.1 C) (Oral)  Resp 16  SpO2 100%  LMP 12/05/2014 Physical Exam  Constitutional: She is oriented to person, place, and time. She appears well-developed and well-nourished. No distress.  HENT:  Head: Normocephalic and atraumatic.  Eyes: Conjunctivae and EOM are normal.  Neck: Normal range of motion.  Cardiovascular: Normal rate and regular rhythm.  Exam reveals no gallop and no friction rub.   No murmur heard. Pulmonary/Chest: Effort normal and breath sounds normal. She has no wheezes. She has no rales. She exhibits no tenderness.  Abdominal: Soft. She exhibits no distension. There is tenderness. There is no rebound.  Left lower abdominal tenderness to palpation. Well-healed horizontal incision scar without dehiscence or drainage.   Musculoskeletal: Normal range of motion.  Neurological: She is alert and oriented to person, place, and time. Coordination normal.  Speech is goal-oriented. Moves limbs without ataxia.   Skin: Skin is warm and dry.  Psychiatric: She has a normal mood and affect. Her behavior is normal.  Nursing note and vitals reviewed.   ED Course  Procedures (including critical care time) Labs Review Labs Reviewed  LIPASE, BLOOD - Abnormal; Notable for the following:    Lipase 17 (*)    All other components within normal limits  COMPREHENSIVE METABOLIC PANEL - Abnormal; Notable for the following:    ALT 13 (*)    All other components within normal limits  CBC - Abnormal; Notable for the following:    WBC 11.9 (*)    Hemoglobin 11.5 (*)    MCV 73.3 (*)    MCH 23.1 (*)    RDW 17.1 (*)    All other components within normal limits  BASIC METABOLIC PANEL - Abnormal; Notable for the following:    CO2 21 (*)    Glucose, Bld 102 (*)    All other components within normal limits  CBC WITH DIFFERENTIAL/PLATELET - Abnormal; Notable for the following:    Hemoglobin 10.6 (*)    HCT 33.1 (*)    MCV 72.7 (*)    MCH 23.3 (*)    RDW 17.0 (*)    All  other components within normal limits  URINALYSIS, ROUTINE W REFLEX MICROSCOPIC (NOT AT [facility])  CBC  CBC  PROTIME-INR  APTT    Imaging Review Ct Abdomen Pelvis W Contrast  03/20/2015   CLINICAL DATA:  Lower abdominal pain starting yesterday. Six weeks post hysterectomy. Nausea.  EXAM: CT ABDOMEN AND PELVIS WITH CONTRAST  TECHNIQUE: Multidetector CT imaging of the abdomen and pelvis was performed using the standard protocol following bolus administration of intravenous contrast.  CONTRAST:  130mL OMNIPAQUE IOHEXOL 300 MG/ML  SOLN  COMPARISON:  01/11/2015  FINDINGS: Lung bases are clear.  The liver, spleen, gallbladder, pancreas, adrenal glands, kidneys, abdominal aorta, inferior vena cava, and retroperitoneal lymph nodes are unremarkable. Stomach, small bowel, and colon are not abnormally distended. Contrast material  flows through to the colon without evidence of bowel obstruction. No free air or free fluid in the abdomen.  Pelvis: Surgical absence of the uterus. There is a loculated appearing fluid collection extending from the pre rectal space into the left pelvis and measuring about 6.7 x 10.7 cm. This is suspicious for abscess. Inflammatory stranding in the subcutaneous fat in the pelvis. Diverticulosis of the sigmoid colon with thickening of the colonic wall. This could represent secondary inflammation or coexisting diverticulitis. Bladder wall is not thickened. Appendix is normal. No destructive bone lesions.  IMPRESSION: Loculated fluid collection in the posterior and left pelvis with inflammatory stranding in the pelvic fat and inflammatory thickening of the wall of the sigmoid colon. Sigmoid diverticulosis. Changes likely represent diverticulitis. The abscess could be due to the diverticulitis or to postoperative change.   Electronically Signed   By: Lucienne Capers M.D.   On: 03/20/2015 02:11   I have personally reviewed and evaluated these images and lab results as part of my medical  decision-making.   EKG Interpretation None      MDM   Final diagnoses:  Pelvic abscess in female    10:37 PM Patient's labs show mild elevated WBC at 11.9. Remaining labs unremarkable. Patient will have CT abdomen pelvis.   Patient's CT shows pelvic abscess on the left. I spoke with Surgery who will see the patient.   Alvina Chou, PA-C 03/20/15 2043  Quintella Reichert, MD 03/21/15 [phone]

## 2015-03-19 NOTE — ED Notes (Signed)
Pt here for lower abd pain that started yesterday. sts nausea. Denies and bleeding. sts recent hysterectomy. sts when she coughs it hurts.

## 2015-03-20 ENCOUNTER — Emergency Department (HOSPITAL_COMMUNITY): Payer: Self-pay

## 2015-03-20 DIAGNOSIS — K5720 Diverticulitis of large intestine with perforation and abscess without bleeding: Secondary | ICD-10-CM

## 2015-03-20 LAB — CBC WITH DIFFERENTIAL/PLATELET
Basophils Absolute: 0 10*3/uL (ref 0.0–0.1)
Basophils Relative: 0 % (ref 0–1)
EOS ABS: 0.2 10*3/uL (ref 0.0–0.7)
EOS PCT: 1 % (ref 0–5)
HCT: 33.1 % — ABNORMAL LOW (ref 36.0–46.0)
HEMOGLOBIN: 10.6 g/dL — AB (ref 12.0–15.0)
LYMPHS ABS: 2.1 10*3/uL (ref 0.7–4.0)
LYMPHS PCT: 20 % (ref 12–46)
MCH: 23.3 pg — AB (ref 26.0–34.0)
MCHC: 32 g/dL (ref 30.0–36.0)
MCV: 72.7 fL — AB (ref 78.0–100.0)
MONOS PCT: 9 % (ref 3–12)
Monocytes Absolute: 1 10*3/uL (ref 0.1–1.0)
Neutro Abs: 7.2 10*3/uL (ref 1.7–7.7)
Neutrophils Relative %: 70 % (ref 43–77)
PLATELETS: 294 10*3/uL (ref 150–400)
RBC: 4.55 MIL/uL (ref 3.87–5.11)
RDW: 17 % — ABNORMAL HIGH (ref 11.5–15.5)
WBC: 10.4 10*3/uL (ref 4.0–10.5)

## 2015-03-20 LAB — BASIC METABOLIC PANEL
Anion gap: 9 (ref 5–15)
BUN: 6 mg/dL (ref 6–20)
CHLORIDE: 106 mmol/L (ref 101–111)
CO2: 21 mmol/L — AB (ref 22–32)
CREATININE: 0.78 mg/dL (ref 0.44–1.00)
Calcium: 8.9 mg/dL (ref 8.9–10.3)
GFR calc Af Amer: >60 mL/min (ref >60)
GFR calc non Af Amer: >60 mL/min (ref >60)
GLUCOSE: 102 mg/dL — AB (ref 65–99)
POTASSIUM: 3.9 mmol/L (ref 3.5–5.1)
Sodium: 136 mmol/L (ref 135–145)

## 2015-03-20 MED ORDER — MOMETASONE FURO-FORMOTEROL FUM 200-5 MCG/ACT IN AERO
2.0000 | INHALATION_SPRAY | Freq: Two times a day (BID) | RESPIRATORY_TRACT | Status: DC
  Administered 2015-03-20 – 2015-03-23 (×7): 2 via RESPIRATORY_TRACT
  Filled 2015-03-20: qty 8.8

## 2015-03-20 MED ORDER — METRONIDAZOLE IN NACL 5-0.79 MG/ML-% IV SOLN
500.0000 mg | Freq: Three times a day (TID) | INTRAVENOUS | Status: DC
  Administered 2015-03-20 – 2015-03-23 (×11): 500 mg via INTRAVENOUS
  Filled 2015-03-20 (×10): qty 100

## 2015-03-20 MED ORDER — OXYCODONE HCL 5 MG PO TABS
5.0000 mg | ORAL_TABLET | ORAL | Status: DC | PRN
  Administered 2015-03-22 – 2015-03-23 (×6): 10 mg via ORAL
  Filled 2015-03-20: qty 1
  Filled 2015-03-20: qty 2
  Filled 2015-03-20: qty 1
  Filled 2015-03-20 (×2): qty 2
  Filled 2015-03-20: qty 1
  Filled 2015-03-20: qty 2
  Filled 2015-03-20 (×2): qty 1

## 2015-03-20 MED ORDER — ENOXAPARIN SODIUM 40 MG/0.4ML ~~LOC~~ SOLN
40.0000 mg | SUBCUTANEOUS | Status: DC
Start: 2015-03-20 — End: 2015-03-20

## 2015-03-20 MED ORDER — ONDANSETRON 4 MG PO TBDP
4.0000 mg | ORAL_TABLET | Freq: Four times a day (QID) | ORAL | Status: DC | PRN
  Filled 2015-03-20: qty 1

## 2015-03-20 MED ORDER — IOHEXOL 300 MG/ML  SOLN
100.0000 mL | Freq: Once | INTRAMUSCULAR | Status: AC | PRN
  Administered 2015-03-20: 100 mL via INTRAVENOUS

## 2015-03-20 MED ORDER — ENOXAPARIN SODIUM 40 MG/0.4ML ~~LOC~~ SOLN: 40.0000 mg | SUBCUTANEOUS | Status: DC

## 2015-03-20 MED ORDER — DEXTROSE-NACL 5-0.45 % IV SOLN
INTRAVENOUS | Status: DC
  Administered 2015-03-20: 14:00:00 via INTRAVENOUS
  Administered 2015-03-20: 125 mL/h via INTRAVENOUS
  Administered 2015-03-21 – 2015-03-22 (×4): via INTRAVENOUS

## 2015-03-20 MED ORDER — ONDANSETRON HCL 4 MG/2ML IJ SOLN: 4.0000 mg | Freq: Four times a day (QID) | INTRAMUSCULAR | Status: DC | PRN

## 2015-03-20 MED ORDER — ENOXAPARIN SODIUM 40 MG/0.4ML ~~LOC~~ SOLN
40.0000 mg | SUBCUTANEOUS | Status: DC
  Administered 2015-03-21 – 2015-03-23 (×3): 40 mg via SUBCUTANEOUS
  Filled 2015-03-20 (×3): qty 0.4

## 2015-03-20 MED ORDER — DEXTROSE 5 % IV SOLN
2.0000 g | Freq: Every day | INTRAVENOUS | Status: DC
  Administered 2015-03-20 – 2015-03-22 (×3): 2 g via INTRAVENOUS
  Filled 2015-03-20 (×4): qty 2

## 2015-03-20 MED ORDER — MORPHINE SULFATE (PF) 4 MG/ML IV SOLN
4.0000 mg | Freq: Once | INTRAVENOUS | Status: AC
  Administered 2015-03-20: 4 mg via INTRAVENOUS
  Filled 2015-03-20: qty 1

## 2015-03-20 MED ORDER — MORPHINE SULFATE (PF) 2 MG/ML IV SOLN
2.0000 mg | INTRAVENOUS | Status: DC | PRN
  Administered 2015-03-20 – 2015-03-22 (×9): 2 mg via INTRAVENOUS
  Filled 2015-03-20 (×9): qty 1

## 2015-03-20 MED ORDER — ALBUTEROL SULFATE (2.5 MG/3ML) 0.083% IN NEBU: 2.5000 mg | INHALATION_SOLUTION | Freq: Four times a day (QID) | RESPIRATORY_TRACT | Status: DC | PRN

## 2015-03-20 NOTE — Care Management Note (Signed)
Case Management Note  Patient Details  Name: DEKLYNN CHARLET MRN: [id number] Date of Birth: 09/09/1970  Subjective/Objective:                 Patient admitted with abdominal pain and abcess to pelvis. Patient to have drain placed. Will assess need for [facility] RN after intervention. Patient had pelvic surgery recently with post op hematoma. Patient states she lives at home, with a roommate, her children are grown and out of the house. She does not drive but takes the bus. She is an established patient at [facility] and wants to follow up there at discharge.   Action/Plan:  Will continue to follow and offer resources and [facility] as needed.  Expected Discharge Date:  03/22/15               Expected Discharge Plan:  Home/Self Care  In-House Referral:     Discharge planning Services  CM Consult  Post Acute Care Choice:    Choice offered to:     DME Arranged:    DME Agency:     HH Arranged:    HH Agency:     Status of Service:  In process, will continue to follow  Medicare Important Message Given:    Date Medicare IM Given:    Medicare IM give by:    Date Additional Medicare IM Given:    Additional Medicare Important Message give by:     If discussed at [place] of Stay Meetings, dates discussed:    Additional Comments:  Carles Collet, RN 03/20/2015, 2:53 PM

## 2015-03-20 NOTE — Consult Note (Signed)
Chief Complaint: Patient was seen in consultation today for abdominal abscess; diverticular Chief Complaint  Patient presents with  . Abdominal Pain   at the request of Dr Dennis Bast  Referring Physician(s): Dr Dennis Bast  History of Present Illness: Anne Schaefer is a 44 y.o. female   Hx hysterectomy 12/2014 Hx diverticulitis 3 days of low abd pain CT: IMPRESSION: Loculated fluid collection in the posterior and left pelvis with inflammatory stranding in the pelvic fat and inflammatory thickening of the wall of the sigmoid colon. Sigmoid diverticulosis. Changes likely represent diverticulitis. The abscess could be due to the diverticulitis or to postoperative change.  Request for abscess drain placement I have seen and examined pt Interventional Radiologist to review imaging Tentatively scheduled for procedure 9/9   Past Medical History  Diagnosis Date  . Asthma   . Obesity   . Seasonal allergies   . Diverticulitis   . Ovarian cyst   . Infection     trich  . Hypertension   . Pneumonia     had 2 times 1997 and 2012  . Headache     migraine in 2015  . Anemia     on Iron   . History of blood transfusion   . Hematoma 01/2015    Past Surgical History  Procedure Laterality Date  . Tubal ligation    . Cesarean section    . Forehead reconstruction      as a child  . Wisdom tooth extraction    . Abdominal hysterectomy N/A 12/31/2014    Procedure: HYSTERECTOMY ABDOMINAL;  Surgeon: Woodroe Mode, MD;  Location: [facility];  Service: Gynecology;  Laterality: N/A;  . Salpingoophorectomy Right 12/31/2014    Procedure: RIGHT SALPINGO OOPHORECTOMY;  Surgeon: Woodroe Mode, MD;  Location: [facility];  Service: Gynecology;  Laterality: Right;    Allergies: Ibuprofen; Azithromycin; and Other  Medications: Prior to Admission medications   Medication Sig Start Date End Date Taking? Authorizing Provider  albuterol (PROVENTIL HFA;VENTOLIN HFA) 108 (90 BASE) MCG/ACT inhaler  Inhale 2 puffs into the lungs every 6 (six) hours as needed for wheezing or shortness of breath. 09/09/14  Yes Jennifer Piepenbrink, PA-C  diphenhydrAMINE (BENADRYL) 25 mg capsule Take 25 mg by mouth every 6 (six) hours as needed for itching or allergies.   Yes Historical Provider, MD  ferrous sulfate 325 (65 FE) MG tablet Take 325 mg by mouth daily.    Yes Historical Provider, MD  hydrochlorothiazide (HYDRODIURIL) 25 MG tablet Take 1 tablet (25 mg total) by mouth daily. 03/08/14  Yes Lorayne Marek, MD  mometasone-formoterol (DULERA) 200-5 MCG/ACT AERO Inhale 2 puffs into the lungs 2 (two) times daily. 09/09/14  Yes Baron Sane, PA-C     Family History  Problem Relation Age of Onset  . Diabetes Mother   . Heart disease Mother     chf  . Hypertension Father     Social History   Social History  . Marital Status: Single    Spouse Name: N/A  . Number of Children: N/A  . Years of Education: N/A   Social History Main Topics  . Smoking status: Current Some Day Smoker -- 0.25 packs/day for 8 years    Types: Cigarettes  . Smokeless tobacco: Never Used     Comment: 3-4 cigs per day  . Alcohol Use: 0.0 oz/week  . Drug Use: Yes    Special: Marijuana     Comment: occasionally  . Sexual Activity: Not Currently  Birth Control/ Protection: Surgical   Other Topics Concern  . None   Social History Narrative     Review of Systems: A 12 point ROS discussed and pertinent positives are indicated in the HPI above.  All other systems are negative.  Review of Systems  Constitutional: Positive for activity change. Negative for fever.  Respiratory: Negative for shortness of breath.   Cardiovascular: Negative for chest pain.  Gastrointestinal: Positive for abdominal pain.  Neurological: Negative for weakness.   Psychiatric/Behavioral: Negative for behavioral problems and confusion.    Vital Signs: BP 131/71 mmHg  Pulse 69  Temp(Src) 98.3 F (36.8 C) (Oral)  Resp 18  Ht 5' 3.6"  (1.615 m)  Wt 235 lb 14.3 oz (107 kg)  BMI 41.02 kg/m2  SpO2 100%  LMP 12/05/2014  Physical Exam  Cardiovascular: Normal rate, regular rhythm and normal heart sounds.   Pulmonary/Chest: Effort normal and breath sounds normal.  Abdominal: Soft. There is tenderness.  Musculoskeletal: Normal range of motion.  Neurological: She is alert.  Skin: Skin is warm.  Psychiatric: She has a normal mood and affect. Her behavior is normal. Judgment and thought content normal.  Nursing note and vitals reviewed.   Mallampati Score:  MD Evaluation Airway: WNL Heart: WNL Abdomen: WNL Chest/ Lungs: WNL ASA  Classification: 2 Mallampati/Airway Score: One  Imaging: Ct Abdomen Pelvis W Contrast  03/20/2015   CLINICAL DATA:  Lower abdominal pain starting yesterday. Six weeks post hysterectomy. Nausea.  EXAM: CT ABDOMEN AND PELVIS WITH CONTRAST  TECHNIQUE: Multidetector CT imaging of the abdomen and pelvis was performed using the standard protocol following bolus administration of intravenous contrast.  CONTRAST:  147mL OMNIPAQUE IOHEXOL 300 MG/ML  SOLN  COMPARISON:  01/11/2015  FINDINGS: Lung bases are clear.  The liver, spleen, gallbladder, pancreas, adrenal glands, kidneys, abdominal aorta, inferior vena cava, and retroperitoneal lymph nodes are unremarkable. Stomach, small bowel, and colon are not abnormally distended. Contrast material flows through to the colon without evidence of bowel obstruction. No free air or free fluid in the abdomen.  Pelvis: Surgical absence of the uterus. There is a loculated appearing fluid collection extending from the pre rectal space into the left pelvis and measuring about 6.7 x 10.7 cm. This is suspicious for abscess. Inflammatory stranding in the subcutaneous fat in the pelvis. Diverticulosis of the sigmoid colon with thickening of the colonic wall. This could represent secondary inflammation or coexisting diverticulitis. Bladder wall is not thickened. Appendix is normal. No  destructive bone lesions.  IMPRESSION: Loculated fluid collection in the posterior and left pelvis with inflammatory stranding in the pelvic fat and inflammatory thickening of the wall of the sigmoid colon. Sigmoid diverticulosis. Changes likely represent diverticulitis. The abscess could be due to the diverticulitis or to postoperative change.   Electronically Signed   By: Lucienne Capers M.D.   On: 03/20/2015 02:11    Labs:  CBC:  Recent Labs  01/13/15 0605 01/14/15 0616 03/19/15 1840 03/20/15 0813  WBC 12.0* 10.7* 11.9* 10.4  HGB 8.3* 8.5* 11.5* 10.6*  HCT 26.1* 27.1* 36.5 33.1*  PLT 307 327 386 294    COAGS:  Recent Labs  01/12/15 1525  INR 1.02  APTT 33    BMP:  Recent Labs  12/19/14 1204 01/11/15 2145 03/19/15 1840 03/20/15 0813  NA 138 136 136 136  K 3.8 3.8 3.6 3.9  CL 110 109 102 106  CO2 22 22 24  21*  GLUCOSE 97 99 88 102*  BUN 9 10 10  6  CALCIUM 9.0 8.8* 9.4 8.9  CREATININE 0.88 0.77 0.89 0.78  GFRNONAA >60 >60 >60 >60  GFRAA >60 >60 >60 >60    LIVER FUNCTION TESTS:  Recent Labs  04/17/14 1303 10/05/14 1343 01/11/15 2145 03/19/15 1840  BILITOT <0.2* 0.3 0.7 0.3  AST 18 21 29 16   ALT 14 17 28  13*  ALKPHOS 57 41 64 58  PROT 7.3 7.3 7.0 7.5  ALBUMIN 3.6 3.9 3.7 3.8    TUMOR MARKERS: No results for input(s): AFPTM, CEA, CA199, CHROMGRNA in the last 8760 hours.  Assessment and Plan:  abd pain 3 days CT reveals what appears to be diverticular abscess IR MD to review imaging Tentatively scheduled for drain placement 9/9--dependent on IR review Risks and Benefits discussed with the patient including bleeding, infection, damage to adjacent structures, bowel perforation/fistula connection, and sepsis. All of the patient's questions were answered, patient is agreeable to proceed. Consent signed and in chart.   Thank you for this interesting consult.  I greatly enjoyed meeting Anne Schaefer and look forward to participating in their  care.  A copy of this report was sent to the requesting provider on this date.   Dr Laurence Ferrari has seen and reviewed imaging and approves procedure  Signed: Lindaann Gradilla,Artina A 03/20/2015, 3:18 PM   I spent a total of 20 Minutes    in face to face in clinical consultation, greater than 50% of which was counseling/coordinating care for abscess drain placement

## 2015-03-20 NOTE — H&P (Signed)
Anne Schaefer is an 44 y.o. female.   Chief Complaint: abdominal pain HPI: 44 yo female with 3 days of lower abdominal pain. Constant, moves to her back. No nausea/vomiting. She last had a BM 2 days ago. She notes frequent urination. She was treated for diverticulitis 1x as an outpatient. No family history of colon cancer, no personal history of colonoscopy.  In June she had a hysterectomy complicated by subfascial hematoma. Symptoms from that have since resolved.  Past Medical History  Diagnosis Date  . Asthma   . Obesity   . Seasonal allergies   . Diverticulitis   . Ovarian cyst   . Infection     trich  . Hypertension   . Pneumonia     had 2 times 1997 and 2012  . Headache     migraine in 2015  . Anemia     on Iron   . History of blood transfusion   . Hematoma 01/2015    Past Surgical History  Procedure Laterality Date  . Tubal ligation    . Cesarean section    . Forehead reconstruction      as a child  . Wisdom tooth extraction    . Abdominal hysterectomy N/A 12/31/2014    Procedure: HYSTERECTOMY ABDOMINAL;  Surgeon: Woodroe Mode, MD;  Location: [facility];  Service: Gynecology;  Laterality: N/A;  . Salpingoophorectomy Right 12/31/2014    Procedure: RIGHT SALPINGO OOPHORECTOMY;  Surgeon: Woodroe Mode, MD;  Location: [facility];  Service: Gynecology;  Laterality: Right;    Family History  Problem Relation Age of Onset  . Diabetes Mother   . Heart disease Mother     chf  . Hypertension Father    Social History:  reports that she has been smoking Cigarettes.  She has a 2 pack-year smoking history. She has never used smokeless tobacco. She reports that she drinks alcohol. She reports that she uses illicit drugs (Marijuana).  Allergies:  Allergies  Allergen Reactions  . Ibuprofen Shortness Of Breath  . Azithromycin Hives  . Other Itching and Rash    Pt states that she is allergic to "[place] [employer]".         (Not in a hospital admission)  Results for orders  placed or performed during the hospital encounter of 03/19/15 (from the past 48 hour(s))  Lipase, blood     Status: Abnormal   Collection Time: 03/19/15  6:40 PM  Result Value Ref Range   Lipase 17 (L) 22 - 51 U/L  Comprehensive metabolic panel     Status: Abnormal   Collection Time: 03/19/15  6:40 PM  Result Value Ref Range   Sodium 136 135 - 145 mmol/L   Potassium 3.6 3.5 - 5.1 mmol/L   Chloride 102 101 - 111 mmol/L   CO2 24 22 - 32 mmol/L   Glucose, Bld 88 65 - 99 mg/dL   BUN 10 6 - 20 mg/dL   Creatinine, Ser 0.89 0.44 - 1.00 mg/dL   Calcium 9.4 8.9 - 10.3 mg/dL   Total Protein 7.5 6.5 - 8.1 g/dL   Albumin 3.8 3.5 - 5.0 g/dL   AST 16 15 - 41 U/L   ALT 13 (L) 14 - 54 U/L   Alkaline Phosphatase 58 38 - 126 U/L   Total Bilirubin 0.3 0.3 - 1.2 mg/dL   GFR calc non Af Amer >60 >60 mL/min   GFR calc Af Amer >60 >60 mL/min    Comment: (NOTE) The eGFR  has been calculated using the CKD EPI equation. This calculation has not been validated in all clinical situations. eGFR's persistently <60 mL/min signify possible Chronic Kidney Disease.    Anion gap 10 5 - 15  CBC     Status: Abnormal   Collection Time: 03/19/15  6:40 PM  Result Value Ref Range   WBC 11.9 (H) 4.0 - 10.5 K/uL   RBC 4.98 3.87 - 5.11 MIL/uL   Hemoglobin 11.5 (L) 12.0 - 15.0 g/dL   HCT 36.5 36.0 - 46.0 %   MCV 73.3 (L) 78.0 - 100.0 fL   MCH 23.1 (L) 26.0 - 34.0 pg   MCHC 31.5 30.0 - 36.0 g/dL   RDW 17.1 (H) 11.5 - 15.5 %   Platelets 386 150 - 400 K/uL  Urinalysis, Routine w reflex microscopic (not at [facility])     Status: None   Collection Time: 03/19/15  6:45 PM  Result Value Ref Range   Color, Urine YELLOW YELLOW   APPearance CLEAR CLEAR   Specific Gravity, Urine 1.021 1.005 - 1.030   pH 5.5 5.0 - 8.0   Glucose, UA NEGATIVE NEGATIVE mg/dL   Hgb urine dipstick NEGATIVE NEGATIVE   Bilirubin Urine NEGATIVE NEGATIVE   Ketones, ur NEGATIVE NEGATIVE mg/dL   Protein, ur NEGATIVE NEGATIVE mg/dL   Urobilinogen, UA  0.2 0.0 - 1.0 mg/dL   Nitrite NEGATIVE NEGATIVE   Leukocytes, UA NEGATIVE NEGATIVE    Comment: MICROSCOPIC NOT DONE ON URINES WITH NEGATIVE PROTEIN, BLOOD, LEUKOCYTES, NITRITE, OR GLUCOSE <1000 mg/dL.   Ct Abdomen Pelvis W Contrast  03/20/2015   CLINICAL DATA:  Lower abdominal pain starting yesterday. Six weeks post hysterectomy. Nausea.  EXAM: CT ABDOMEN AND PELVIS WITH CONTRAST  TECHNIQUE: Multidetector CT imaging of the abdomen and pelvis was performed using the standard protocol following bolus administration of intravenous contrast.  CONTRAST:  169m OMNIPAQUE IOHEXOL 300 MG/ML  SOLN  COMPARISON:  01/11/2015  FINDINGS: Lung bases are clear.  The liver, spleen, gallbladder, pancreas, adrenal glands, kidneys, abdominal aorta, inferior vena cava, and retroperitoneal lymph nodes are unremarkable. Stomach, small bowel, and colon are not abnormally distended. Contrast material flows through to the colon without evidence of bowel obstruction. No free air or free fluid in the abdomen.  Pelvis: Surgical absence of the uterus. There is a loculated appearing fluid collection extending from the pre rectal space into the left pelvis and measuring about 6.7 x 10.7 cm. This is suspicious for abscess. Inflammatory stranding in the subcutaneous fat in the pelvis. Diverticulosis of the sigmoid colon with thickening of the colonic wall. This could represent secondary inflammation or coexisting diverticulitis. Bladder wall is not thickened. Appendix is normal. No destructive bone lesions.  IMPRESSION: Loculated fluid collection in the posterior and left pelvis with inflammatory stranding in the pelvic fat and inflammatory thickening of the wall of the sigmoid colon. Sigmoid diverticulosis. Changes likely represent diverticulitis. The abscess could be due to the diverticulitis or to postoperative change.   Electronically Signed   By: WLucienne CapersM.D.   On: 03/20/2015 02:11    Review of Systems  Constitutional:  Positive for malaise/fatigue. Negative for fever and chills.  HENT: Negative for hearing loss and tinnitus.   Eyes: Negative for blurred vision, double vision and photophobia.  Respiratory: Positive for cough. Negative for hemoptysis and sputum production.   Cardiovascular: Negative for chest pain and palpitations.  Gastrointestinal: Positive for abdominal pain. Negative for nausea, vomiting and diarrhea.  Genitourinary: Positive for frequency.  Musculoskeletal:  Positive for back pain. Negative for myalgias and neck pain.  Skin: Negative for itching and rash.  Neurological: Negative for dizziness, tingling, tremors and headaches.  Endo/Heme/Allergies: Negative for environmental allergies. Does not bruise/bleed easily.  Psychiatric/Behavioral: Negative for hallucinations and substance abuse.    Blood pressure 156/83, pulse 75, temperature 98.5 F (36.9 C), temperature source Oral, resp. rate 17, last menstrual period 12/05/2014, SpO2 97 %. Physical Exam  Vitals reviewed. Constitutional: She is oriented to person, place, and time. She appears well-developed and well-nourished.  HENT:  Head: Normocephalic and atraumatic.  Eyes: Conjunctivae are normal. Pupils are equal, round, and reactive to light.  Neck: Normal range of motion. Neck supple.  Cardiovascular: Normal rate and regular rhythm.   Respiratory: Effort normal and breath sounds normal.  GI: Soft. She exhibits no distension. There is tenderness in the left lower quadrant. There is no rebound and no guarding. No hernia.  Musculoskeletal: Normal range of motion.  Neurological: She is alert and oriented to person, place, and time. No cranial nerve deficit.  Skin: Skin is warm and dry.  Psychiatric: She has a normal mood and affect. Her behavior is normal.     Assessment/Plan 44 yo female with 3 days abdominal pain, leukocytosis and CT findings of colon wall thickening with surrounding fluid collection consistent with  diverticulitis. -admit -IVF -abx -pain control -NPO  Arta Bruce Kinsinger 03/20/2015, 3:16 AM

## 2015-03-20 NOTE — ED Notes (Signed)
General Surgery MD at bedside.

## 2015-03-20 NOTE — Progress Notes (Signed)
Patient ID: Anne Schaefer, female   DOB: 1970/12/18, 44 y.o.   MRN: [id number]     [facility]      [address] [place], [place] [address]    Phone: [phone] FAX: [phone]     Subjective: Pain okay overnight, returned this AM.  Had a BM, no diarrhea.  No n/v.   Sister hospitalized in June at [facility] for abdominal pain, states it may have been diverticulitis, but sounds more like colitis. Denies family history of colon cancer. Has not had a colonoscopy and no previous episodes of diverticulitis.   Objective:  Vital signs:  Filed Vitals:   03/20/15 0257 03/20/15 0258 03/20/15 0300 03/20/15 0417  BP: 156/83  139/83 151/84  Pulse:  75 76 74  Temp:    98.1 F (36.7 C)  TempSrc:    Oral  Resp:    16  Height:    5' 3.6" (1.615 m)  Weight:    107 kg (235 lb 14.3 oz)  SpO2:  97% 96% 99%    Last BM Date: 03/19/15  Intake/Output   Yesterday:  09/07 0701 - 09/08 0700 In: 350 [I.V.:200; IV Piggyback:150] Out: 200 [Urine:200] This shift: I/O last 3 completed shifts: In: 350 [I.V.:200; IV Piggyback:150] Out: 200 [Urine:200]    Physical Exam: General: Pt awake/alert/oriented x4 in no acute distress Chest: cta.  No chest wall pain w good excursion CV:  Pulses intact.  Regular rhythm Abdomen: Soft.  Nondistended.  TTP to lower abdomen most appreciated LLQ. No evidence of peritonitis.  No incarcerated hernias. Ext:  SCDs BLE.  No mjr edema.  No cyanosis Skin: No petechiae / purpura   Problem List:   Principal Problem:   Diverticulitis of large intestine with abscess without bleeding    Results:   Labs: Results for orders placed or performed during the hospital encounter of 03/19/15 (from the past 48 hour(s))  Lipase, blood     Status: Abnormal   Collection Time: 03/19/15  6:40 PM  Result Value Ref Range   Lipase 17 (L) 22 - 51 U/L  Comprehensive metabolic panel     Status: Abnormal   Collection Time: 03/19/15  6:40  PM  Result Value Ref Range   Sodium 136 135 - 145 mmol/L   Potassium 3.6 3.5 - 5.1 mmol/L   Chloride 102 101 - 111 mmol/L   CO2 24 22 - 32 mmol/L   Glucose, Bld 88 65 - 99 mg/dL   BUN 10 6 - 20 mg/dL   Creatinine, Ser 0.89 0.44 - 1.00 mg/dL   Calcium 9.4 8.9 - 10.3 mg/dL   Total Protein 7.5 6.5 - 8.1 g/dL   Albumin 3.8 3.5 - 5.0 g/dL   AST 16 15 - 41 U/L   ALT 13 (L) 14 - 54 U/L   Alkaline Phosphatase 58 38 - 126 U/L   Total Bilirubin 0.3 0.3 - 1.2 mg/dL   GFR calc non Af Amer >60 >60 mL/min   GFR calc Af Amer >60 >60 mL/min    Comment: (NOTE) The eGFR has been calculated using the CKD EPI equation. This calculation has not been validated in all clinical situations. eGFR's persistently <60 mL/min signify possible Chronic Kidney Disease.    Anion gap 10 5 - 15  CBC     Status: Abnormal   Collection Time: 03/19/15  6:40 PM  Result Value Ref Range   WBC 11.9 (H) 4.0 - 10.5 K/uL  RBC 4.98 3.87 - 5.11 MIL/uL   Hemoglobin 11.5 (L) 12.0 - 15.0 g/dL   HCT 36.5 36.0 - 46.0 %   MCV 73.3 (L) 78.0 - 100.0 fL   MCH 23.1 (L) 26.0 - 34.0 pg   MCHC 31.5 30.0 - 36.0 g/dL   RDW 17.1 (H) 11.5 - 15.5 %   Platelets 386 150 - 400 K/uL  Urinalysis, Routine w reflex microscopic (not at [facility])     Status: None   Collection Time: 03/19/15  6:45 PM  Result Value Ref Range   Color, Urine YELLOW YELLOW   APPearance CLEAR CLEAR   Specific Gravity, Urine 1.021 1.005 - 1.030   pH 5.5 5.0 - 8.0   Glucose, UA NEGATIVE NEGATIVE mg/dL   Hgb urine dipstick NEGATIVE NEGATIVE   Bilirubin Urine NEGATIVE NEGATIVE   Ketones, ur NEGATIVE NEGATIVE mg/dL   Protein, ur NEGATIVE NEGATIVE mg/dL   Urobilinogen, UA 0.2 0.0 - 1.0 mg/dL   Nitrite NEGATIVE NEGATIVE   Leukocytes, UA NEGATIVE NEGATIVE    Comment: MICROSCOPIC NOT DONE ON URINES WITH NEGATIVE PROTEIN, BLOOD, LEUKOCYTES, NITRITE, OR GLUCOSE <1000 mg/dL.    Imaging / Studies: Ct Abdomen Pelvis W Contrast  03/20/2015   CLINICAL DATA:  Lower abdominal  pain starting yesterday. Six weeks post hysterectomy. Nausea.  EXAM: CT ABDOMEN AND PELVIS WITH CONTRAST  TECHNIQUE: Multidetector CT imaging of the abdomen and pelvis was performed using the standard protocol following bolus administration of intravenous contrast.  CONTRAST:  118m OMNIPAQUE IOHEXOL 300 MG/ML  SOLN  COMPARISON:  01/11/2015  FINDINGS: Lung bases are clear.  The liver, spleen, gallbladder, pancreas, adrenal glands, kidneys, abdominal aorta, inferior vena cava, and retroperitoneal lymph nodes are unremarkable. Stomach, small bowel, and colon are not abnormally distended. Contrast material flows through to the colon without evidence of bowel obstruction. No free air or free fluid in the abdomen.  Pelvis: Surgical absence of the uterus. There is a loculated appearing fluid collection extending from the pre rectal space into the left pelvis and measuring about 6.7 x 10.7 cm. This is suspicious for abscess. Inflammatory stranding in the subcutaneous fat in the pelvis. Diverticulosis of the sigmoid colon with thickening of the colonic wall. This could represent secondary inflammation or coexisting diverticulitis. Bladder wall is not thickened. Appendix is normal. No destructive bone lesions.  IMPRESSION: Loculated fluid collection in the posterior and left pelvis with inflammatory stranding in the pelvic fat and inflammatory thickening of the wall of the sigmoid colon. Sigmoid diverticulosis. Changes likely represent diverticulitis. The abscess could be due to the diverticulitis or to postoperative change.   Electronically Signed   By: WLucienne CapersM.D.   On: 03/20/2015 02:11    Medications / Allergies:  Scheduled Meds: . cefTRIAXone (ROCEPHIN)  IV  2 g Intravenous Daily   And  . metronidazole  500 mg Intravenous 3 times per day  . enoxaparin (LOVENOX) injection  40 mg Subcutaneous Q24H  . mometasone-formoterol  2 puff Inhalation BID   Continuous Infusions: . dextrose 5 % and 0.45% NaCl 125  mL/hr (03/20/15 0424)   PRN Meds:.albuterol, morphine injection, ondansetron **OR** ondansetron (ZOFRAN) IV, oxyCODONE  Antibiotics: Anti-infectives    Start     Dose/Rate Route Frequency Ordered Stop   03/20/15 0500  cefTRIAXone (ROCEPHIN) 2 g in dextrose 5 % 50 mL IVPB     2 g 100 mL/hr over 30 Minutes Intravenous Daily 03/20/15 0416     03/20/15 0500  metroNIDAZOLE (FLAGYL) IVPB 500 mg  500 mg 100 mL/hr over 60 Minutes Intravenous 3 times per day 03/20/15 0416          Assessment/Plan Acute diverticulitis with fluid collection-ask IR to evaluate for possible drain.  No indications for surgery at this point.  Hopefully will resolve with non op management.  She will need a colonoscopy in 6-8 weeks. ID-rocephin/flagyl VTE prophylaxis-SCD/lovenox FEN-IVF, ice chips, pain control Dispo-continue inpatient   Erby Pian, [facility] Pager [phone](7A-4:30P) For consults and floor pages call [phone](7A-4:30P)  03/20/2015 8:12 AM

## 2015-03-21 ENCOUNTER — Inpatient Hospital Stay (HOSPITAL_COMMUNITY): Payer: Self-pay

## 2015-03-21 LAB — PROTIME-INR
INR: 1.25 (ref 0.00–1.49)
Prothrombin Time: 15.9 seconds — ABNORMAL HIGH (ref 11.6–15.2)

## 2015-03-21 LAB — APTT: APTT: 33 s (ref 24–37)

## 2015-03-21 MED ORDER — LIDOCAINE-EPINEPHRINE 1 %-1:100000 IJ SOLN
INTRAMUSCULAR | Status: AC
  Filled 2015-03-21: qty 1

## 2015-03-21 MED ORDER — MIDAZOLAM HCL 2 MG/2ML IJ SOLN
INTRAMUSCULAR | Status: AC
  Filled 2015-03-21: qty 4

## 2015-03-21 MED ORDER — FENTANYL CITRATE (PF) 100 MCG/2ML IJ SOLN
INTRAMUSCULAR | Status: AC
  Filled 2015-03-21: qty 4

## 2015-03-21 NOTE — Progress Notes (Signed)
Patient ID: Anne Schaefer, female   DOB: 05-06-1971, 44 y.o.   MRN: [id number]     [facility]      [address] [place], [place] [address]    Phone: [phone] FAX: [phone]     Subjective: Pain is much better.  WBC down.  Afebrile.    Objective:  Vital signs:  Filed Vitals:   03/20/15 2005 03/20/15 2115 03/21/15 0510 03/21/15 0841  BP:  150/66 131/85 168/90  Pulse:  67 73 73  Temp:  98.5 F (36.9 C) 98.9 F (37.2 C)   TempSrc:  Oral Oral   Resp:  _0 Height:      Weight:      SpO2: 100% 99% 100% 100%    Last BM Date: 03/19/15  Intake/Output   Yesterday:  09/08 0701 - 09/09 0700 In: 0  Out: 1300 [Urine:1300] This shift:    I/O last 3 completed shifts: In: 350 [I.V.:200; IV Piggyback:150] Out: 1500 [Urine:1500]    Physical Exam: General: Pt awake/alert/oriented x4 in no acute distress Chest: cta.  No chest wall pain w good excursion CV:  Pulses intact.  Regular rhythm Abdomen: Soft.  Nondistended.  Minimal TTP LLQ.  No evidence of peritonitis.  No incarcerated hernias. Ext:  SCDs BLE.  No mjr edema.  No cyanosis Skin: No petechiae / purpura   Problem List:   Principal Problem:   Diverticulitis of large intestine with abscess without bleeding    Results:   Labs: Results for orders placed or performed during the hospital encounter of 03/19/15 (from the past 48 hour(s))  Lipase, blood     Status: Abnormal   Collection Time: 03/19/15  6:40 PM  Result Value Ref Range   Lipase 17 (L) 22 - 51 U/L  Comprehensive metabolic panel     Status: Abnormal   Collection Time: 03/19/15  6:40 PM  Result Value Ref Range   Sodium 136 135 - 145 mmol/L   Potassium 3.6 3.5 - 5.1 mmol/L   Chloride 102 101 - 111 mmol/L   CO2 24 22 - 32 mmol/L   Glucose, Bld 88 65 - 99 mg/dL   BUN 10 6 - 20 mg/dL   Creatinine, Ser 0.89 0.44 - 1.00 mg/dL   Calcium 9.4 8.9 - 10.3 mg/dL   Total Protein 7.5 6.5 - 8.1 g/dL    Albumin 3.8 3.5 - 5.0 g/dL   AST 16 15 - 41 U/L   ALT 13 (L) 14 - 54 U/L   Alkaline Phosphatase 58 38 - 126 U/L   Total Bilirubin 0.3 0.3 - 1.2 mg/dL   GFR calc non Af Amer >60 >60 mL/min   GFR calc Af Amer >60 >60 mL/min    Comment: (NOTE) The eGFR has been calculated using the CKD EPI equation. This calculation has not been validated in all clinical situations. eGFR's persistently <60 mL/min signify possible Chronic Kidney Disease.    Anion gap 10 5 - 15  CBC     Status: Abnormal   Collection Time: 03/19/15  6:40 PM  Result Value Ref Range   WBC 11.9 (H) 4.0 - 10.5 K/uL   RBC 4.98 3.87 - 5.11 MIL/uL   Hemoglobin 11.5 (L) 12.0 - 15.0 g/dL   HCT 36.5 36.0 - 46.0 %   MCV 73.3 (L) 78.0 - 100.0 fL   MCH 23.1 (L) 26.0 - 34.0 pg   MCHC 31.5 30.0 - 36.0 g/dL  RDW 17.1 (H) 11.5 - 15.5 %   Platelets 386 150 - 400 K/uL  Urinalysis, Routine w reflex microscopic (not at [facility])     Status: None   Collection Time: 03/19/15  6:45 PM  Result Value Ref Range   Color, Urine YELLOW YELLOW   APPearance CLEAR CLEAR   Specific Gravity, Urine 1.021 1.005 - 1.030   pH 5.5 5.0 - 8.0   Glucose, UA NEGATIVE NEGATIVE mg/dL   Hgb urine dipstick NEGATIVE NEGATIVE   Bilirubin Urine NEGATIVE NEGATIVE   Ketones, ur NEGATIVE NEGATIVE mg/dL   Protein, ur NEGATIVE NEGATIVE mg/dL   Urobilinogen, UA 0.2 0.0 - 1.0 mg/dL   Nitrite NEGATIVE NEGATIVE   Leukocytes, UA NEGATIVE NEGATIVE    Comment: MICROSCOPIC NOT DONE ON URINES WITH NEGATIVE PROTEIN, BLOOD, LEUKOCYTES, NITRITE, OR GLUCOSE <1000 mg/dL.  Basic metabolic panel     Status: Abnormal   Collection Time: 03/20/15  8:13 AM  Result Value Ref Range   Sodium 136 135 - 145 mmol/L   Potassium 3.9 3.5 - 5.1 mmol/L   Chloride 106 101 - 111 mmol/L   CO2 21 (L) 22 - 32 mmol/L   Glucose, Bld 102 (H) 65 - 99 mg/dL   BUN 6 6 - 20 mg/dL   Creatinine, Ser 0.78 0.44 - 1.00 mg/dL   Calcium 8.9 8.9 - 10.3 mg/dL   GFR calc non Af Amer >60 >60 mL/min   GFR calc Af  Amer >60 >60 mL/min    Comment: (NOTE) The eGFR has been calculated using the CKD EPI equation. This calculation has not been validated in all clinical situations. eGFR's persistently <60 mL/min signify possible Chronic Kidney Disease.    Anion gap 9 5 - 15  CBC WITH DIFFERENTIAL     Status: Abnormal   Collection Time: 03/20/15  8:13 AM  Result Value Ref Range   WBC 10.4 4.0 - 10.5 K/uL   RBC 4.55 3.87 - 5.11 MIL/uL   Hemoglobin 10.6 (L) 12.0 - 15.0 g/dL   HCT 33.1 (L) 36.0 - 46.0 %   MCV 72.7 (L) 78.0 - 100.0 fL   MCH 23.3 (L) 26.0 - 34.0 pg   MCHC 32.0 30.0 - 36.0 g/dL   RDW 17.0 (H) 11.5 - 15.5 %   Platelets 294 150 - 400 K/uL   Neutrophils Relative % 70 43 - 77 %   Neutro Abs 7.2 1.7 - 7.7 K/uL   Lymphocytes Relative 20 12 - 46 %   Lymphs Abs 2.1 0.7 - 4.0 K/uL   Monocytes Relative 9 3 - 12 %   Monocytes Absolute 1.0 0.1 - 1.0 K/uL   Eosinophils Relative 1 0 - 5 %   Eosinophils Absolute 0.2 0.0 - 0.7 K/uL   Basophils Relative 0 0 - 1 %   Basophils Absolute 0.0 0.0 - 0.1 K/uL  Protime-INR     Status: Abnormal   Collection Time: 03/21/15  7:00 AM  Result Value Ref Range   Prothrombin Time 15.9 (H) 11.6 - 15.2 seconds   INR 1.25 0.00 - 1.49  APTT     Status: None   Collection Time: 03/21/15  7:00 AM  Result Value Ref Range   aPTT 33 24 - 37 seconds    Imaging / Studies: Ct Abdomen Pelvis W Contrast  03/20/2015   CLINICAL DATA:  Lower abdominal pain starting yesterday. Six weeks post hysterectomy. Nausea.  EXAM: CT ABDOMEN AND PELVIS WITH CONTRAST  TECHNIQUE: Multidetector CT imaging of the abdomen and  pelvis was performed using the standard protocol following bolus administration of intravenous contrast.  CONTRAST:  12m OMNIPAQUE IOHEXOL 300 MG/ML  SOLN  COMPARISON:  01/11/2015  FINDINGS: Lung bases are clear.  The liver, spleen, gallbladder, pancreas, adrenal glands, kidneys, abdominal aorta, inferior vena cava, and retroperitoneal lymph nodes are unremarkable. Stomach,  small bowel, and colon are not abnormally distended. Contrast material flows through to the colon without evidence of bowel obstruction. No free air or free fluid in the abdomen.  Pelvis: Surgical absence of the uterus. There is a loculated appearing fluid collection extending from the pre rectal space into the left pelvis and measuring about 6.7 x 10.7 cm. This is suspicious for abscess. Inflammatory stranding in the subcutaneous fat in the pelvis. Diverticulosis of the sigmoid colon with thickening of the colonic wall. This could represent secondary inflammation or coexisting diverticulitis. Bladder wall is not thickened. Appendix is normal. No destructive bone lesions.  IMPRESSION: Loculated fluid collection in the posterior and left pelvis with inflammatory stranding in the pelvic fat and inflammatory thickening of the wall of the sigmoid colon. Sigmoid diverticulosis. Changes likely represent diverticulitis. The abscess could be due to the diverticulitis or to postoperative change.   Electronically Signed   By: WLucienne CapersM.D.   On: 03/20/2015 02:11    Medications / Allergies:  Scheduled Meds: . cefTRIAXone (ROCEPHIN)  IV  2 g Intravenous Daily   And  . metronidazole  500 mg Intravenous 3 times per day  . enoxaparin (LOVENOX) injection  40 mg Subcutaneous Q24H  . lidocaine-EPINEPHrine      . mometasone-formoterol  2 puff Inhalation BID   Continuous Infusions: . dextrose 5 % and 0.45% NaCl 125 mL/hr at 03/21/15 0225   PRN Meds:.albuterol, morphine injection, ondansetron **OR** ondansetron (ZOFRAN) IV, oxyCODONE  Antibiotics: Anti-infectives    Start     Dose/Rate Route Frequency Ordered Stop   03/20/15 0500  cefTRIAXone (ROCEPHIN) 2 g in dextrose 5 % 50 mL IVPB     2 g 100 mL/hr over 30 Minutes Intravenous Daily 03/20/15 0416     03/20/15 0500  metroNIDAZOLE (FLAGYL) IVPB 500 mg     500 mg 100 mL/hr over 60 Minutes Intravenous 3 times per day 03/20/15 0416          Assessment/Plan HD#2 Acute diverticulitis with fluid collection-IR did not feel drain was indicated.  Pt is better, will allow for clears today. She will need a colonoscopy in 6-8 weeks. ID-rocephin/flagyl D#2 VTE prophylaxis-SCD/lovenox FEN-IVF, clears today, will not advance diet today, re-evaluate tomorrow.  Dispo-continue inpatient    EErby Pian [facility] Pager [phone] For consults and floor pages call [phone]([id number])  03/21/2015 10:52 AM

## 2015-03-21 NOTE — Sedation Documentation (Signed)
MD at bedside.  Dr Pascal Lux at bedside explaining procedure.

## 2015-03-21 NOTE — Sedation Documentation (Signed)
Procedure cancelled. Md spoke with pt.

## 2015-03-22 LAB — CBC
HEMATOCRIT: 31.7 % — AB (ref 36.0–46.0)
HEMOGLOBIN: 10.2 g/dL — AB (ref 12.0–15.0)
MCH: 23.8 pg — ABNORMAL LOW (ref 26.0–34.0)
MCHC: 32.2 g/dL (ref 30.0–36.0)
MCV: 73.9 fL — AB (ref 78.0–100.0)
Platelets: 312 10*3/uL (ref 150–400)
RBC: 4.29 MIL/uL (ref 3.87–5.11)
RDW: 17.3 % — AB (ref 11.5–15.5)
WBC: 6.7 10*3/uL (ref 4.0–10.5)

## 2015-03-22 NOTE — Progress Notes (Signed)
Subjective: Mild abd pain, some loose stool, no n/v tol clears   Objective: Vital signs in last 24 hours: Temp:  [97.8 F (36.6 C)-98.6 F (37 C)] 98.2 F (36.8 C) (09/10 0519) Pulse Rate:  [59-73] 59 (09/10 0519) Resp:  [16-18] 18 (09/10 0519) BP: (136-160)/(78-91) 145/86 mmHg (09/10 0519) SpO2:  [99 %-100 %] 100 % (09/10 0519) Last BM Date: 03/21/15  Intake/Output from previous day: 09/09 0701 - 09/10 0700 In: 840 [P.O.:840] Out: 1376 [Urine:1375; Stool:1] Intake/Output this shift: Total I/O In: 120 [P.O.:120] Out: -   GI: soft minimally tender llq bs present  Lab Results:   Recent Labs  03/20/15 0813 03/22/15 0519  WBC 10.4 6.7  HGB 10.6* 10.2*  HCT 33.1* 31.7*  PLT 294 312   BMET  Recent Labs  03/19/15 1840 03/20/15 0813  NA 136 136  K 3.6 3.9  CL 102 106  CO2 24 21*  GLUCOSE 88 102*  BUN 10 6  CREATININE 0.89 0.78  CALCIUM 9.4 8.9   PT/INR  Recent Labs  03/21/15 0700  LABPROT 15.9*  INR 1.25   ABG No results for input(s): PHART, HCO3 in the last 72 hours.  Invalid input(s): PCO2, PO2  Studies/Results: Ct Pelvis Wo Contrast  03/21/2015   CLINICAL DATA:  History of hysterectomy approximately 6 weeks ago, now with indeterminate fluid collection within the pelvic cul-de-sac. Please perform CT-guided biopsy or aspiration for diagnostic and therapeutic.  EXAM: CT PELVIS WITHOUT CONTRAST  TECHNIQUE: With the patient positioned prone, multi detector CT imaging of the pelvis was performed following the standard protocol without intravenous contrast.  COMPARISON:  CT the abdomen pelvis - 03/20/2015  FINDINGS: The patient was positioned supine on the CT gantry and noncontrast images were obtained of the lower pelvis for procedural planning.  Review of the images demonstrate interval decrease in the now only approximately 6.6 x 4.6 cm fluid collection anterior to the rectum (image 55, series 2, previously, 10.7 x 6.7 cm.  Given interval decrease in size  of the serpiginous fluid collection within the pelvic cul-de-sac, the decision was made to postpone attempted CT-guided aspiration and/or drainage catheter placement at this time.  Noncontrast images demonstrate passes of enteric contrast to the level of the rectum. Scattered colonic diverticulosis without definable/drainable fluid collection on this noncontrast examination. Normal noncontrast appearance of the appendix. Normal noncontrast appearance of the urinary bladder given degree of distention.  No acute or aggressive osseous abnormalities.  Regional soft tissues appear normal.  IMPRESSION: 1. Interval decrease in the size of the serpiginous fluid collection with the pelvic cul-de-sac, currently 6.6 cm, previously 10.7 cm, as such, the decision was made not to proceed with CT-guided aspiration/drainage catheter placement at this time. 2. Extensive colonic diverticulosis.  Above findings discussed with Dr. Grandville Silos ([facility]) at 11:11.   Electronically Signed   By: Sandi Mariscal M.D.   On: 03/21/2015 11:20    Anti-infectives: Anti-infectives    Start     Dose/Rate Route Frequency Ordered Stop   03/20/15 0500  cefTRIAXone (ROCEPHIN) 2 g in dextrose 5 % 50 mL IVPB     2 g 100 mL/hr over 30 Minutes Intravenous Daily 03/20/15 0416     03/20/15 0500  metroNIDAZOLE (FLAGYL) IVPB 500 mg     500 mg 100 mL/hr over 60 Minutes Intravenous 3 times per day 03/20/15 0416        Assessment/Plan: HD#3 Acute diverticulitis with fluid collection-IR did not feel drain was possible, she is clinically better  afebrile nl wbc, still larger collection to resolve with abx.  Cont iv abx at least another 24 hours She will need a colonoscopy in 6-8 weeks. ID-rocephin/flagyl D#3 VTE prophylaxis-SCD/lovenox FEN-IVF, fulls today will adat Dispo-continue inpatient   [facility] 03/22/2015

## 2015-03-23 MED ORDER — METRONIDAZOLE 500 MG PO TABS
500.0000 mg | ORAL_TABLET | Freq: Three times a day (TID) | ORAL | Status: DC
  Administered 2015-03-23 – 2015-03-24 (×3): 500 mg via ORAL
  Filled 2015-03-23 (×3): qty 1

## 2015-03-23 MED ORDER — HYDROCHLOROTHIAZIDE 25 MG PO TABS
25.0000 mg | ORAL_TABLET | Freq: Every day | ORAL | Status: DC
  Administered 2015-03-23 – 2015-03-24 (×2): 25 mg via ORAL
  Filled 2015-03-23 (×2): qty 1

## 2015-03-23 MED ORDER — CIPROFLOXACIN HCL 500 MG PO TABS
500.0000 mg | ORAL_TABLET | Freq: Two times a day (BID) | ORAL | Status: DC
  Administered 2015-03-23 – 2015-03-24 (×3): 500 mg via ORAL
  Filled 2015-03-23 (×3): qty 1

## 2015-03-23 NOTE — Progress Notes (Signed)
  Subjective: Comfortable, less pain Loose bm's  Objective: Vital signs in last 24 hours: Temp:  [98.3 F (36.8 C)-98.4 F (36.9 C)] 98.4 F (36.9 C) (09/11 0458) Pulse Rate:  [62-64] 64 (09/11 0458) Resp:  [18] 18 (09/11 0458) BP: (145-161)/(71-92) 160/92 mmHg (09/11 0458) SpO2:  [100 %] 100 % (09/11 0458) Last BM Date: 03/22/15  Intake/Output from previous day: 09/10 0701 - 09/11 0700 In: 1895 [P.O.:120; I.V.:1625; IV Piggyback:150] Out: 600 [Urine:600] Intake/Output this shift:    Abdomen soft with minimal LLQ tenderness  Lab Results:   Recent Labs  03/22/15 0519  WBC 6.7  HGB 10.2*  HCT 31.7*  PLT 312   BMET No results for input(s): NA, K, CL, CO2, GLUCOSE, BUN, CREATININE, CALCIUM in the last 72 hours. PT/INR  Recent Labs  03/21/15 0700  LABPROT 15.9*  INR 1.25   ABG No results for input(s): PHART, HCO3 in the last 72 hours.  Invalid input(s): PCO2, PO2  Studies/Results: Ct Pelvis Wo Contrast  03/21/2015   CLINICAL DATA:  History of hysterectomy approximately 6 weeks ago, now with indeterminate fluid collection within the pelvic cul-de-sac. Please perform CT-guided biopsy or aspiration for diagnostic and therapeutic.  EXAM: CT PELVIS WITHOUT CONTRAST  TECHNIQUE: With the patient positioned prone, multi detector CT imaging of the pelvis was performed following the standard protocol without intravenous contrast.  COMPARISON:  CT the abdomen pelvis - 03/20/2015  FINDINGS: The patient was positioned supine on the CT gantry and noncontrast images were obtained of the lower pelvis for procedural planning.  Review of the images demonstrate interval decrease in the now only approximately 6.6 x 4.6 cm fluid collection anterior to the rectum (image 55, series 2, previously, 10.7 x 6.7 cm.  Given interval decrease in size of the serpiginous fluid collection within the pelvic cul-de-sac, the decision was made to postpone attempted CT-guided aspiration and/or drainage  catheter placement at this time.  Noncontrast images demonstrate passes of enteric contrast to the level of the rectum. Scattered colonic diverticulosis without definable/drainable fluid collection on this noncontrast examination. Normal noncontrast appearance of the appendix. Normal noncontrast appearance of the urinary bladder given degree of distention.  No acute or aggressive osseous abnormalities.  Regional soft tissues appear normal.  IMPRESSION: 1. Interval decrease in the size of the serpiginous fluid collection with the pelvic cul-de-sac, currently 6.6 cm, previously 10.7 cm, as such, the decision was made not to proceed with CT-guided aspiration/drainage catheter placement at this time. 2. Extensive colonic diverticulosis.  Above findings discussed with Dr. Grandville Silos ([facility]) at 11:11.   Electronically Signed   By: Sandi Mariscal M.D.   On: 03/21/2015 11:20    Anti-infectives: Anti-infectives    Start     Dose/Rate Route Frequency Ordered Stop   03/20/15 0500  cefTRIAXone (ROCEPHIN) 2 g in dextrose 5 % 50 mL IVPB     2 g 100 mL/hr over 30 Minutes Intravenous Daily 03/20/15 0416     03/20/15 0500  metroNIDAZOLE (FLAGYL) IVPB 500 mg     500 mg 100 mL/hr over 60 Minutes Intravenous 3 times per day 03/20/15 0416        Assessment/Plan: s/p * No surgery found *  Diverticulitis with abscess  Improving on IV antibiotics Will change to oral antibiotics Potential discharge tomorrow Will need GI referral for f/u colonoscopy 6 -8 weeks  LOS: 3 days    Anne Schaefer 03/23/2015

## 2015-03-23 NOTE — Progress Notes (Signed)
Notified Dr. Hulen Skains of patient's BP at 179/98 (117). Patient has history of hypertension. Orders to be received. Will continue to monitor.

## 2015-03-24 MED ORDER — CIPROFLOXACIN HCL 500 MG PO TABS: 500.0000 mg | ORAL_TABLET | Freq: Two times a day (BID) | ORAL | Status: DC

## 2015-03-24 MED ORDER — METRONIDAZOLE 500 MG PO TABS: 500.0000 mg | ORAL_TABLET | Freq: Three times a day (TID) | ORAL | Status: DC

## 2015-03-24 MED ORDER — OXYCODONE HCL 5 MG PO TABS: 5.0000 mg | ORAL_TABLET | Freq: Four times a day (QID) | ORAL | Status: DC | PRN

## 2015-03-24 NOTE — Discharge Summary (Signed)
Physician Discharge Summary  Anne Schaefer:[id number] DOB: 09-Jun-1971 DOA: 03/19/2015  PCP: Angelica Chessman, MD  Consultation: Interventional radiology   Admit date: 03/19/2015 Discharge date: 03/24/2015  Recommendations for Outpatient Follow-up:   Follow-up Information    Follow up with [facility]    .   Contact information:   [address] [place] [address] [phone]      Follow up with Mickeal Skinner, MD In 2 weeks.   Specialty:  General Surgery   Contact information:   [place] [address] [phone]      Discharge Diagnoses:  1. Acute diverticulitis with abscess   Surgical Procedure: none  Discharge Condition: stable Disposition: home  Diet recommendation: low fiber  Filed Weights   03/20/15 0417  Weight: 107 kg (235 lb 14.3 oz)    Filed Vitals:   03/24/15 0515  BP: 161/86  Pulse: 56  Temp: 98.3 F (36.8 C)  Resp: 18      Hospital Course:  Shakira Los is a 44 year old female with a history of supracervical abdominal hysterectomy, right salpingo-oophorectomy in June complicated by post operative subfascial hematoma presented with abdominal pain x3 days.  CT of A/P showed acute diverticulitis with a large fluid collection.  The patient was admitted for IV antibiotics and bowel rest.  Interventional radiology was consulted for drain placement.  At the time of the procedure(2 days after admission) a repeat CT noted a decrease in fluid collection and therefore the procedure was cancelled.  Pain improved, WBC normalized, remained afebrile. On HD#5 the patient was tolerating a low fiber diet, pain had resolved, having BMs and therefore felt stable for discharge home with additional 9 days of cipro/flagyl.  Diet education was provided before discharge.  Warning signs that warrant immediate attention were discussed.  Medication risks, benefits and therapeutic  alternatives were reviewed with the patient.  She verbalizes understanding.  Encouraged to call with questions or concerns.      Discharge Instructions     Medication List    TAKE these medications        albuterol 108 (90 BASE) MCG/ACT inhaler  Commonly known as:  PROVENTIL HFA;VENTOLIN HFA  Inhale 2 puffs into the lungs every 6 (six) hours as needed for wheezing or shortness of breath.     ciprofloxacin 500 MG tablet  Commonly known as:  CIPRO  Take 1 tablet (500 mg total) by mouth 2 (two) times daily.     diphenhydrAMINE 25 mg capsule  Commonly known as:  BENADRYL  Take 25 mg by mouth every 6 (six) hours as needed for itching or allergies.     ferrous sulfate 325 (65 FE) MG tablet  Take 325 mg by mouth daily.     hydrochlorothiazide 25 MG tablet  Commonly known as:  HYDRODIURIL  Take 1 tablet (25 mg total) by mouth daily.     metroNIDAZOLE 500 MG tablet  Commonly known as:  FLAGYL  Take 1 tablet (500 mg total) by mouth every 8 (eight) hours.     mometasone-formoterol 200-5 MCG/ACT Aero  Commonly known as:  DULERA  Inhale 2 puffs into the lungs 2 (two) times daily.     oxyCODONE 5 MG immediate release tablet  Commonly known as:  Oxy IR/ROXICODONE  Take 1 tablet (5 mg total) by mouth every 6 (six) hours as needed for moderate pain.  Follow-up Information    Follow up with [facility]    .   Contact information:   [address] [place] [address] [phone]      Follow up with Mickeal Skinner, MD In 2 weeks.   Specialty:  General Surgery   Contact information:   [place] [address] [phone]        The results of significant diagnostics from this hospitalization (including imaging, microbiology, ancillary and laboratory) are listed below for reference.    Significant Diagnostic Studies: Ct Pelvis Wo Contrast  03/21/2015   CLINICAL DATA:  History of  hysterectomy approximately 6 weeks ago, now with indeterminate fluid collection within the pelvic cul-de-sac. Please perform CT-guided biopsy or aspiration for diagnostic and therapeutic.  EXAM: CT PELVIS WITHOUT CONTRAST  TECHNIQUE: With the patient positioned prone, multi detector CT imaging of the pelvis was performed following the standard protocol without intravenous contrast.  COMPARISON:  CT the abdomen pelvis - 03/20/2015  FINDINGS: The patient was positioned supine on the CT gantry and noncontrast images were obtained of the lower pelvis for procedural planning.  Review of the images demonstrate interval decrease in the now only approximately 6.6 x 4.6 cm fluid collection anterior to the rectum (image 55, series 2, previously, 10.7 x 6.7 cm.  Given interval decrease in size of the serpiginous fluid collection within the pelvic cul-de-sac, the decision was made to postpone attempted CT-guided aspiration and/or drainage catheter placement at this time.  Noncontrast images demonstrate passes of enteric contrast to the level of the rectum. Scattered colonic diverticulosis without definable/drainable fluid collection on this noncontrast examination. Normal noncontrast appearance of the appendix. Normal noncontrast appearance of the urinary bladder given degree of distention.  No acute or aggressive osseous abnormalities.  Regional soft tissues appear normal.  IMPRESSION: 1. Interval decrease in the size of the serpiginous fluid collection with the pelvic cul-de-sac, currently 6.6 cm, previously 10.7 cm, as such, the decision was made not to proceed with CT-guided aspiration/drainage catheter placement at this time. 2. Extensive colonic diverticulosis.  Above findings discussed with Dr. Grandville Silos ([facility]) at 11:11.   Electronically Signed   By: Sandi Mariscal M.D.   On: 03/21/2015 11:20   Ct Abdomen Pelvis W Contrast  03/20/2015   CLINICAL DATA:  Lower abdominal pain starting yesterday. Six weeks post hysterectomy.  Nausea.  EXAM: CT ABDOMEN AND PELVIS WITH CONTRAST  TECHNIQUE: Multidetector CT imaging of the abdomen and pelvis was performed using the standard protocol following bolus administration of intravenous contrast.  CONTRAST:  158mL OMNIPAQUE IOHEXOL 300 MG/ML  SOLN  COMPARISON:  01/11/2015  FINDINGS: Lung bases are clear.  The liver, spleen, gallbladder, pancreas, adrenal glands, kidneys, abdominal aorta, inferior vena cava, and retroperitoneal lymph nodes are unremarkable. Stomach, small bowel, and colon are not abnormally distended. Contrast material flows through to the colon without evidence of bowel obstruction. No free air or free fluid in the abdomen.  Pelvis: Surgical absence of the uterus. There is a loculated appearing fluid collection extending from the pre rectal space into the left pelvis and measuring about 6.7 x 10.7 cm. This is suspicious for abscess. Inflammatory stranding in the subcutaneous fat in the pelvis. Diverticulosis of the sigmoid colon with thickening of the colonic wall. This could represent secondary inflammation or coexisting diverticulitis. Bladder wall is not thickened. Appendix is normal. No destructive bone lesions.  IMPRESSION: Loculated fluid collection in the posterior and left  pelvis with inflammatory stranding in the pelvic fat and inflammatory thickening of the wall of the sigmoid colon. Sigmoid diverticulosis. Changes likely represent diverticulitis. The abscess could be due to the diverticulitis or to postoperative change.   Electronically Signed   By: Lucienne Capers M.D.   On: 03/20/2015 02:11    Microbiology: No results found for this or any previous visit (from the past 240 hour(s)).   Labs: Basic Metabolic Panel:  Recent Labs Lab 03/19/15 1840 03/20/15 0813  NA 136 136  K 3.6 3.9  CL 102 106  CO2 24 21*  GLUCOSE 88 102*  BUN 10 6  CREATININE 0.89 0.78  CALCIUM 9.4 8.9   Liver Function Tests:  Recent Labs Lab 03/19/15 1840  AST 16  ALT 13*   ALKPHOS 58  BILITOT 0.3  PROT 7.5  ALBUMIN 3.8    Recent Labs Lab 03/19/15 1840  LIPASE 17*   No results for input(s): AMMONIA in the last 168 hours. CBC:  Recent Labs Lab 03/19/15 1840 03/20/15 0813 03/22/15 0519  WBC 11.9* 10.4 6.7  NEUTROABS  --  7.2  --   HGB 11.5* 10.6* 10.2*  HCT 36.5 33.1* 31.7*  MCV 73.3* 72.7* 73.9*  PLT 386 294 312   Cardiac Enzymes: No results for input(s): CKTOTAL, CKMB, CKMBINDEX, TROPONINI in the last 168 hours. BNP: BNP (last 3 results) No results for input(s): BNP in the last 8760 hours.  ProBNP (last 3 results) No results for input(s): PROBNP in the last 8760 hours.  CBG: No results for input(s): GLUCAP in the last 168 hours.  Principal Problem:   Diverticulitis of large intestine with abscess without bleeding   Time coordinating discharge: <30 mins  Signed:  Meliah Appleman, ANP-BC

## 2015-03-24 NOTE — Discharge Instructions (Signed)

## 2015-03-24 NOTE — Progress Notes (Signed)
Erby Pian, NP telephone order to create a work note for patient stating she can return to work next week 03/31/15.

## 2015-03-24 NOTE — Care Management Note (Signed)
Case Management Note  Patient Details  Name: Anne Schaefer MRN: [id number] Date of Birth: 1971-03-29  Subjective/Objective:  DC to home today, self care, follow up appointment at [facility] made and AVS updated.   Action/Plan:  No further CM needs at this time  Expected Discharge Date:  03/22/15               Expected Discharge Plan:  Home/Self Care  In-House Referral:     Discharge planning Services  CM Consult, [facility]  Post Acute Care Choice:    Choice offered to:     DME Arranged:    DME Agency:     HH Arranged:    [place] Agency:     Status of Service:  Completed, signed off  Medicare Important Message Given:    Date Medicare IM Given:    Medicare IM give by:    Date Additional Medicare IM Given:    Additional Medicare Important Message give by:     If discussed at [place] of Stay Meetings, dates discussed:    Additional Comments:  Carles Collet, RN 03/24/2015, 10:45 AM

## 2015-04-01 ENCOUNTER — Encounter: Payer: Self-pay | Admitting: Family Medicine

## 2015-04-01 ENCOUNTER — Ambulatory Visit: Payer: Self-pay | Attending: Family Medicine | Admitting: Family Medicine

## 2015-04-01 VITALS — BP 135/90 | HR 88 | Temp 98.0°F | Ht 62.0 in | Wt 233.0 lb

## 2015-04-01 DIAGNOSIS — I10 Essential (primary) hypertension: Secondary | ICD-10-CM | POA: Insufficient documentation

## 2015-04-01 DIAGNOSIS — S301XXD Contusion of abdominal wall, subsequent encounter: Secondary | ICD-10-CM | POA: Insufficient documentation

## 2015-04-01 DIAGNOSIS — K5720 Diverticulitis of large intestine with perforation and abscess without bleeding: Secondary | ICD-10-CM | POA: Insufficient documentation

## 2015-04-01 DIAGNOSIS — B373 Candidiasis of vulva and vagina: Secondary | ICD-10-CM | POA: Insufficient documentation

## 2015-04-01 DIAGNOSIS — B3731 Acute candidiasis of vulva and vagina: Secondary | ICD-10-CM

## 2015-04-01 MED ORDER — FLUCONAZOLE 150 MG PO TABS: 150.0000 mg | ORAL_TABLET | Freq: Once | ORAL | Status: DC

## 2015-04-01 NOTE — Patient Instructions (Signed)

## 2015-04-01 NOTE — Progress Notes (Signed)
Patient here to follow up on her diverticulitis She reports no pain today She is taking her antibiotics and feels she is getting a yeast infection

## 2015-04-02 NOTE — Progress Notes (Signed)
Subjective:  Patient ID: Anne Schaefer, female    DOB: 23-Jan-1971  Age: 44 y.o. MRN: [id number]  CC: Hospitalization Follow-up   HPI Anne Schaefer is a 44 year old female with a history of hypertension, supracervical abdominal hysterectomy, right salpingo-oophorectomy in June (secondary to fibroids and right ovarian dermoid) complicated by post operative rectus sheath hematoma who presented to [facility] ED with abdominal pain x3 days.   CT of abdomen and pelvis showed acute diverticulitis with a large fluid collection. The patient was admitted for IV antibiotics and bowel rest. Interventional radiology was consulted for drain placement. At the time of the procedure(2 days after admission) a repeat CT noted a decrease in fluid collection and therefore the procedure was cancelled. Pain improved, WBC normalized, remained afebrile.  By hospital day 5 the patient was tolerating a low fiber diet, pain had subsided and she was discharged with Cipro and Flagyl for 9 more days.  Interval history: She does have some diarrhea which is occasional with associated streaks of blood which has improved significantly but denies any abdominal pain or fever. Also complains of a thick white, cheesy vaginal discharge and thinks she may have a yeast infection.  Outpatient Prescriptions Prior to Visit  Medication Sig Dispense Refill  . albuterol (PROVENTIL HFA;VENTOLIN HFA) 108 (90 BASE) MCG/ACT inhaler Inhale 2 puffs into the lungs every 6 (six) hours as needed for wheezing or shortness of breath. 1 Inhaler 2  . ciprofloxacin (CIPRO) 500 MG tablet Take 1 tablet (500 mg total) by mouth 2 (two) times daily. 18 tablet 0  . metroNIDAZOLE (FLAGYL) 500 MG tablet Take 1 tablet (500 mg total) by mouth every 8 (eight) hours. 27 tablet 0  . mometasone-formoterol (DULERA) 200-5 MCG/ACT AERO Inhale 2 puffs into the lungs 2 (two) times daily. 1 Inhaler 0  . oxyCODONE (OXY IR/ROXICODONE) 5 MG immediate release tablet  Take 1 tablet (5 mg total) by mouth every 6 (six) hours as needed for moderate pain. 30 tablet 0  . diphenhydrAMINE (BENADRYL) 25 mg capsule Take 25 mg by mouth every 6 (six) hours as needed for itching or allergies.    . ferrous sulfate 325 (65 FE) MG tablet Take 325 mg by mouth daily.     . hydrochlorothiazide (HYDRODIURIL) 25 MG tablet Take 1 tablet (25 mg total) by mouth daily. (Patient not taking: Reported on 04/01/2015) 90 tablet 3   No facility-administered medications prior to visit.    ROS Review of Systems  Constitutional: Negative for activity change, appetite change and fatigue.  HENT: Negative for congestion, sinus pressure and sore throat.   Eyes: Negative for visual disturbance.  Respiratory: Negative for cough, chest tightness, shortness of breath and wheezing.   Cardiovascular: Negative for chest pain and palpitations.  Gastrointestinal: Negative for abdominal pain, constipation and abdominal distention.  Endocrine: Negative for polydipsia.  Genitourinary: Positive for vaginal discharge (thick, cheesy, curdy). Negative for dysuria and frequency.  Musculoskeletal: Negative for back pain and arthralgias.  Skin: Negative for rash.  Neurological: Negative for tremors, light-headedness and numbness.  Hematological: Does not bruise/bleed easily.  Psychiatric/Behavioral: Negative for behavioral problems and agitation.    Objective:  BP 135/90 mmHg  Pulse 88  Temp(Src) 98 F (36.7 C)  Ht 5\' 2"  (1.575 m)  Wt 233 lb (105.688 kg)  BMI 42.61 kg/m2  SpO2 94%  LMP 12/05/2014  BP/Weight 04/01/2015 7/61/9509 09/11/6710  Systolic BP 458 099 -  Diastolic BP 90 86 -  Wt. (Lbs) 233 - 235.89  BMI 42.61 - 41.02    Lab Results  Component Value Date   WBC 6.7 03/22/2015   HGB 10.2* 03/22/2015   HCT 31.7* 03/22/2015   PLT 312 03/22/2015   GLUCOSE 102* 03/20/2015   CHOL 159 06/15/2013   TRIG 47 06/15/2013   HDL 49 06/15/2013   LDLCALC 101* 06/15/2013   ALT 13* 03/19/2015    AST 16 03/19/2015   NA 136 03/20/2015   K 3.9 03/20/2015   CL 106 03/20/2015   CREATININE 0.78 03/20/2015   BUN 6 03/20/2015   CO2 21* 03/20/2015   INR 1.25 03/21/2015   HGBA1C 6.0* 04/09/2012    Physical Exam  Constitutional: She is oriented to person, place, and time. She appears well-developed and well-nourished. No distress.  Neck: Normal range of motion. No JVD present.  Cardiovascular: Normal rate, regular rhythm, normal heart sounds and intact distal pulses.  Exam reveals no gallop.   No murmur heard. Pulmonary/Chest: Effort normal and breath sounds normal. No respiratory distress. She has no wheezes. She has no rales. She exhibits no tenderness.  Abdominal: Soft. Bowel sounds are normal. She exhibits no distension and no mass. There is no tenderness.  Musculoskeletal: Normal range of motion. She exhibits no edema or tenderness.  Neurological: She is alert and oriented to person, place, and time. She has normal reflexes.  Skin: Skin is warm and dry. She is not diaphoretic.  Psychiatric: She has a normal mood and affect.     Assessment & Plan:   1. Essential hypertension, benign Controlled Continue antihypertensives Low-sodium, DASH diet.   2. Diverticulitis of large intestine with abscess without bleeding Patient still has minimal symptoms which have improved compared to the time of discharge. Continue Cipro and Flagyl.  3. Vaginal candidiasis Treated with oral Diflucan.  4. Rectus sheath hematoma Resolved. Patient advised to keep appointment with her GYN.   Meds ordered this encounter  Medications  . fluconazole (DIFLUCAN) 150 MG tablet    Sig: Take 1 tablet (150 mg total) by mouth once.    Dispense:  1 tablet    Refill:  0    Follow-up: Return in about 1 month (around 05/01/2015) for PCP-follow-up on hypertension.Arnoldo Morale MD

## 2015-08-10 ENCOUNTER — Emergency Department (HOSPITAL_COMMUNITY): Payer: Self-pay

## 2015-08-10 ENCOUNTER — Encounter (HOSPITAL_COMMUNITY): Payer: Self-pay | Admitting: Emergency Medicine

## 2015-08-10 ENCOUNTER — Emergency Department (HOSPITAL_COMMUNITY)
Admission: EM | Admit: 2015-08-10 | Discharge: 2015-08-10 | Disposition: A | Discharge status: Home / Self Care | Payer: Self-pay | Attending: Emergency Medicine | Admitting: Emergency Medicine

## 2015-08-10 DIAGNOSIS — Z8701 Personal history of pneumonia (recurrent): Secondary | ICD-10-CM | POA: Insufficient documentation

## 2015-08-10 DIAGNOSIS — S8261XA Displaced fracture of lateral malleolus of right fibula, initial encounter for closed fracture: Secondary | ICD-10-CM

## 2015-08-10 DIAGNOSIS — Y9341 Activity, dancing: Secondary | ICD-10-CM | POA: Insufficient documentation

## 2015-08-10 DIAGNOSIS — Z79899 Other long term (current) drug therapy: Secondary | ICD-10-CM | POA: Insufficient documentation

## 2015-08-10 DIAGNOSIS — Y998 Other external cause status: Secondary | ICD-10-CM | POA: Insufficient documentation

## 2015-08-10 DIAGNOSIS — J45909 Unspecified asthma, uncomplicated: Secondary | ICD-10-CM | POA: Insufficient documentation

## 2015-08-10 DIAGNOSIS — Z8719 Personal history of other diseases of the digestive system: Secondary | ICD-10-CM | POA: Insufficient documentation

## 2015-08-10 DIAGNOSIS — E669 Obesity, unspecified: Secondary | ICD-10-CM | POA: Insufficient documentation

## 2015-08-10 DIAGNOSIS — F17210 Nicotine dependence, cigarettes, uncomplicated: Secondary | ICD-10-CM | POA: Insufficient documentation

## 2015-08-10 DIAGNOSIS — Z8742 Personal history of other diseases of the female genital tract: Secondary | ICD-10-CM | POA: Insufficient documentation

## 2015-08-10 DIAGNOSIS — D649 Anemia, unspecified: Secondary | ICD-10-CM | POA: Insufficient documentation

## 2015-08-10 DIAGNOSIS — S82431A Displaced oblique fracture of shaft of right fibula, initial encounter for closed fracture: Secondary | ICD-10-CM | POA: Insufficient documentation

## 2015-08-10 DIAGNOSIS — Z8619 Personal history of other infectious and parasitic diseases: Secondary | ICD-10-CM | POA: Insufficient documentation

## 2015-08-10 DIAGNOSIS — W1839XA Other fall on same level, initial encounter: Secondary | ICD-10-CM | POA: Insufficient documentation

## 2015-08-10 DIAGNOSIS — I10 Essential (primary) hypertension: Secondary | ICD-10-CM | POA: Insufficient documentation

## 2015-08-10 DIAGNOSIS — Y9289 Other specified places as the place of occurrence of the external cause: Secondary | ICD-10-CM | POA: Insufficient documentation

## 2015-08-10 DIAGNOSIS — Z7951 Long term (current) use of inhaled steroids: Secondary | ICD-10-CM | POA: Insufficient documentation

## 2015-08-10 MED ORDER — OXYCODONE-ACETAMINOPHEN 5-325 MG PO TABS
1.0000 | ORAL_TABLET | Freq: Once | ORAL | Status: AC
  Administered 2015-08-10: 1 via ORAL
  Filled 2015-08-10: qty 1

## 2015-08-10 MED ORDER — OXYCODONE-ACETAMINOPHEN 5-325 MG PO TABS: 1.0000 | ORAL_TABLET | Freq: Four times a day (QID) | ORAL | Status: DC | PRN

## 2015-08-10 NOTE — ED Notes (Signed)
Pt requested more pain medicine because "the first pain medicine didn't really help" Dr Dina Rich notified and gave verbal order to give original dose again.

## 2015-08-10 NOTE — ED Notes (Signed)
Pt verbalized understanding of d/c instructions and has no further questions. Pt stable and NAD. Pt d/c home with family member driving.

## 2015-08-10 NOTE — Progress Notes (Signed)
Orthopedic Tech Progress Note Patient Details:  Anne Schaefer 06-19-1971 TC:[id number]  Ortho Devices Type of Ortho Device: Crutches, Short leg splint, Stirrup splint Ortho Device/Splint Location: rle Ortho Device/Splint Interventions: Ordered, Application   Karolee Stamps 08/10/2015, 4:53 AM

## 2015-08-10 NOTE — ED Notes (Signed)
Pt. lost her balance and fell this evening , reports pain at right lower leg with swelling .

## 2015-08-10 NOTE — ED Notes (Signed)
Pt assisted to bedside commode

## 2015-08-10 NOTE — ED Provider Notes (Signed)
CSN: [id number]     Arrival date & time 08/10/15  0159 History  By signing my name below, I, Erling Conte, attest that this documentation has been prepared under the direction and in the presence of Merryl Hacker, MD. Electronically Signed: Erling Conte, ED Scribe. 08/10/2015. 3:28 AM.    Chief Complaint  Patient presents with  . Leg Pain   The history is provided by the patient. No language interpreter was used.    HPI Comments: Anne Schaefer is a 45 y.o. female who presents to the Emergency Department complaining of constant, 10/10, severe, right ankle pain s/p right ankle injury that occurred tonight. She reports she was out dancing and "was dropping it down low" and when she came up, she felt sudden onset pain and popping sensation in her right ankle and fell to the ground. Pt endorses associated swelling to the ankle. She is able to bear weight but states it provides pain.  She denies any head injuries or other injuries during the fall. She has not taken any medications prior to arrival. She denies any prior injuries to her right foot/ankle. Pt denies any sensation loss, numbness, weakness, color change or other associated symptoms at this time.   Past Medical History  Diagnosis Date  . Asthma   . Obesity   . Seasonal allergies   . Diverticulitis   . Ovarian cyst   . Infection     trich  . Hypertension   . Pneumonia     had 2 times 1997 and 2012  . Headache     migraine in 2015  . Anemia     on Iron   . History of blood transfusion   . Hematoma 01/2015   Past Surgical History  Procedure Laterality Date  . Tubal ligation    . Cesarean section    . Forehead reconstruction      as a child  . Wisdom tooth extraction    . Abdominal hysterectomy N/A 12/31/2014    Procedure: HYSTERECTOMY ABDOMINAL;  Surgeon: Woodroe Mode, MD;  Location: [facility];  Service: Gynecology;  Laterality: N/A;  . Salpingoophorectomy Right 12/31/2014    Procedure: RIGHT SALPINGO OOPHORECTOMY;   Surgeon: Woodroe Mode, MD;  Location: [facility];  Service: Gynecology;  Laterality: Right;   Family History  Problem Relation Age of Onset  . Diabetes Mother   . Heart disease Mother     chf  . Hypertension Father    Social History  Substance Use Topics  . Smoking status: Current Some Day Smoker -- 0.25 packs/day for 8 years    Types: Cigarettes  . Smokeless tobacco: Never Used     Comment: 3-4 cigs per day  . Alcohol Use: 0.0 oz/week     Comment: occ   OB History    Gravida Para Term Preterm AB TAB SAB Ectopic Multiple Living   5 5 4 1  0 0 0 0 0 5     Review of Systems  Musculoskeletal: Positive for joint swelling and arthralgias.  Skin: Negative for color change.  Neurological: Negative for weakness and numbness.  All other systems reviewed and are negative.     Allergies  Ibuprofen; Azithromycin; and Other  Home Medications   Prior to Admission medications   Medication Sig Start Date End Date Taking? Authorizing Provider  albuterol (PROVENTIL HFA;VENTOLIN HFA) 108 (90 BASE) MCG/ACT inhaler Inhale 2 puffs into the lungs every 6 (six) hours as needed for wheezing or shortness of breath.  09/09/14  Yes Jennifer Piepenbrink, PA-C  mometasone-formoterol (DULERA) 200-5 MCG/ACT AERO Inhale 2 puffs into the lungs 2 (two) times daily. 09/09/14  Yes Jennifer Piepenbrink, PA-C  ciprofloxacin (CIPRO) 500 MG tablet Take 1 tablet (500 mg total) by mouth 2 (two) times daily. Patient not taking: Reported on 08/10/2015 03/24/15   Erby Pian, NP  fluconazole (DIFLUCAN) 150 MG tablet Take 1 tablet (150 mg total) by mouth once. Patient not taking: Reported on 08/10/2015 04/01/15   Arnoldo Morale, MD  hydrochlorothiazide (HYDRODIURIL) 25 MG tablet Take 1 tablet (25 mg total) by mouth daily. Patient not taking: Reported on 04/01/2015 03/08/14   Lorayne Marek, MD  metroNIDAZOLE (FLAGYL) 500 MG tablet Take 1 tablet (500 mg total) by mouth every 8 (eight) hours. Patient not taking: Reported on  08/10/2015 03/24/15   Erby Pian, NP  oxyCODONE (OXY IR/ROXICODONE) 5 MG immediate release tablet Take 1 tablet (5 mg total) by mouth every 6 (six) hours as needed for moderate pain. Patient not taking: Reported on 08/10/2015 03/24/15   Erby Pian, NP  oxyCODONE-acetaminophen (PERCOCET/ROXICET) 5-325 MG tablet Take 1 tablet by mouth every 6 (six) hours as needed for severe pain. 08/10/15   Merryl Hacker, MD   BP 160/92 mmHg  Pulse 92  Temp(Src) 98 F (36.7 C) (Oral)  Resp 18  SpO2 99%  LMP 12/05/2014 Physical Exam  Constitutional: She is oriented to person, place, and time. She appears well-developed and well-nourished.  Obese  HENT:  Head: Normocephalic and atraumatic.  Cardiovascular: Normal rate and regular rhythm.   Pulmonary/Chest: Effort normal. No respiratory distress.  Musculoskeletal:  Tenderness to palpation over the right lateral malleolus with mild swelling, 2+ DP pulse, no obvious deformities, tenderness palpation over the right proximal fibular head  Neurological: She is alert and oriented to person, place, and time.  Skin: Skin is warm and dry.  Psychiatric: She has a normal mood and affect.  Nursing note and vitals reviewed.   ED Course  Procedures (including critical care time)  DIAGNOSTIC STUDIES: Oxygen Saturation is 98% on RA, normal by my interpretation.    COORDINATION OF CARE: 2:42 AM- Will order imaging of right tib/fib and right ankle. Will order Percocet for pain mgmt. Pt advised of plan for treatment and pt agrees.  Labs Review Labs Reviewed - No data to display  Imaging Review Dg Tibia/fibula Right  08/10/2015  CLINICAL DATA:  Right ankle pain. Trip and fall injury while dancing. Felt a pop. Lateral right ankle edema. EXAM: RIGHT TIBIA AND FIBULA - 2 VIEW COMPARISON:  None. FINDINGS: There is an oblique fracture of the distal right fibula. The tibia and proximal fibula appear intact. No focal bone lesion or bone destruction. Soft tissues are  unremarkable. IMPRESSION: Oblique fracture distal right fibula. Electronically Signed   By: Lucienne Capers M.D.   On: 08/10/2015 03:16   Dg Ankle Complete Right  08/10/2015  CLINICAL DATA:  Acute onset of right ankle pain after tripping and falling on right leg. Felt pop in right ankle. Right lateral ankle edema. Initial encounter. EXAM: RIGHT ANKLE - COMPLETE 3+ VIEW COMPARISON:  None. FINDINGS: There is a minimally displaced oblique fracture through the distal fibular diaphysis, extending to the ankle mortise. No significant widening of the ankle mortise is seen. The interosseous space is within normal limits. No talar tilt or subluxation is seen. The joint spaces are preserved. Lateral soft tissue swelling is noted. IMPRESSION: Minimally displaced oblique fracture through the distal fibular diaphysis, extending to the  ankle mortise. The talus remains normally aligned at the ankle. Electronically Signed   By: Garald Balding M.D.   On: 08/10/2015 03:17   I have personally reviewed and evaluated these images as part of my medical decision-making.   EKG Interpretation None      MDM   Final diagnoses:  Fractured lateral malleolus, right, closed, initial encounter    Patient presents with right ankle injury. Also with proximal fibular tenderness on exam. Otherwise nontoxic. X-rays notable for a lateral malleolus fracture. It is at just above the ankle mortise no mortise widening. Given that is slightly above the ankle mortise, we'll place in a splint and have patient follow up with orthopedics. Patient was given pain medication and crutches.  After history, exam, and medical workup I feel the patient has been appropriately medically screened and is safe for discharge home. Pertinent diagnoses were discussed with the patient. Patient was given return precautions.  I personally performed the services described in this documentation, which was scribed in my presence. The recorded information has  been reviewed and is accurate.     Merryl Hacker, MD 08/10/15 [phone]

## 2015-08-10 NOTE — Discharge Instructions (Signed)
Ankle Fracture A fracture is a break in a bone. The ankle joint is made up of three bones. These include the lower (distal)sections of your lower leg bones, called the tibia and fibula, along with a bone in your foot, called the talus. Depending on how bad the break is and if more than one ankle joint bone is broken, a cast or splint is used to protect and keep your injured bone from moving while it heals. Sometimes, surgery is required to help the fracture heal properly.  There are two general types of fractures:  Stable fracture. This includes a single fracture line through one bone, with no injury to ankle ligaments. A fracture of the talus that does not have any displacement (movement of the bone on either side of the fracture line) is also stable.  Unstable fracture. This includes more than one fracture line through one or more bones in the ankle joint. It also includes fractures that have displacement of the bone on either side of the fracture line. CAUSES  A direct blow to the ankle.   Quickly and severely twisting your ankle.  Trauma, such as a car accident or falling from a significant height. RISK FACTORS You may be at a higher risk of ankle fracture if:  You have certain medical conditions.  You are involved in high-impact sports.  You are involved in a high-impact car accident. SIGNS AND SYMPTOMS   Tender and swollen ankle.  Bruising around the injured ankle.  Pain on movement of the ankle.  Difficulty walking or putting weight on the ankle.  A cold foot below the site of the ankle injury. This can occur if the blood vessels passing through your injured ankle were also damaged.  Numbness in the foot below the site of the ankle injury. DIAGNOSIS  An ankle fracture is usually diagnosed with a physical exam and X-rays. A CT scan may also be required for complex fractures. TREATMENT  Stable fractures are treated with a cast or splint and using crutches to avoid putting  weight on your injured ankle. This is followed by an ankle strengthening program. Some patients require a special type of cast, depending on other medical problems they may have. Unstable fractures require surgery to ensure the bones heal properly. Your health care provider will tell you what type of fracture you have and the best treatment for your condition. HOME CARE INSTRUCTIONS   Review correct crutch use with your health care provider and use your crutches as directed. Safe use of crutches is extremely important. Misuse of crutches can cause you to fall or cause injury to nerves in your hands or armpits.  Do not put weight or pressure on the injured ankle until directed by your health care provider.  To lessen the swelling, keep the injured leg elevated while sitting or lying down.  Apply ice to the injured area:  Put ice in a plastic bag.  Place a towel between your cast and the bag.  Leave the ice on for 20 minutes, 2-3 times a day.  If you have a plaster or fiberglass cast:  Do not try to scratch the skin under the cast with any objects. This can increase your risk of skin infection.  Check the skin around the cast every day. You may put lotion on any red or sore areas.  Keep your cast dry and clean.  If you have a plaster splint:  Wear the splint as directed.  You may loosen the elastic   around the splint if your toes become numb, tingle, or turn cold or blue.  Do not put pressure on any part of your cast or splint; it may break. Rest your cast only on a pillow the first 24 hours until it is fully hardened.  Your cast or splint can be protected during bathing with a plastic bag sealed to your skin with medical tape. Do not lower the cast or splint into water.  Take medicines as directed by your health care provider. Only take over-the-counter or prescription medicines for pain, discomfort, or fever as directed by your health care provider.  Do not drive a vehicle until  your health care provider specifically tells you it is safe to do so.  If your health care provider has given you a follow-up appointment, it is very important to keep that appointment. Not keeping the appointment could result in a chronic or permanent injury, pain, and disability. If you have any problem keeping the appointment, call the facility for assistance. SEEK MEDICAL CARE IF: You develop increased swelling or discomfort. SEEK IMMEDIATE MEDICAL CARE IF:   Your cast gets damaged or breaks.  You have continued severe pain.  You develop new pain or swelling after the cast was put on.  Your skin or toenails below the injury turn blue or gray.  Your skin or toenails below the injury feel cold, numb, or have loss of sensitivity to touch.  There is a bad smell or pus draining from under the cast. MAKE SURE YOU:   Understand these instructions.  Will watch your condition.  Will get help right away if you are not doing well or get worse.   This information is not intended to replace advice given to you by your health care provider. Make sure you discuss any questions you have with your health care provider.   Document Released: 06/25/2000 Document Revised: 07/03/2013 Document Reviewed: 01/25/2013 Elsevier Interactive Patient Education 2016 [employer]  

## 2015-08-10 NOTE — ED Notes (Addendum)
Pt right  ankle elevated and ice pack placed on ankle

## 2015-08-21 ENCOUNTER — Emergency Department (HOSPITAL_COMMUNITY): Payer: Self-pay

## 2015-08-21 ENCOUNTER — Emergency Department (HOSPITAL_COMMUNITY)
Admission: EM | Admit: 2015-08-21 | Discharge: 2015-08-21 | Disposition: A | Discharge status: Home / Self Care | Payer: Self-pay | Attending: Emergency Medicine | Admitting: Emergency Medicine

## 2015-08-21 ENCOUNTER — Encounter (HOSPITAL_COMMUNITY): Payer: Self-pay

## 2015-08-21 DIAGNOSIS — Z8701 Personal history of pneumonia (recurrent): Secondary | ICD-10-CM | POA: Insufficient documentation

## 2015-08-21 DIAGNOSIS — Z8719 Personal history of other diseases of the digestive system: Secondary | ICD-10-CM | POA: Insufficient documentation

## 2015-08-21 DIAGNOSIS — J45901 Unspecified asthma with (acute) exacerbation: Secondary | ICD-10-CM | POA: Insufficient documentation

## 2015-08-21 DIAGNOSIS — Z8619 Personal history of other infectious and parasitic diseases: Secondary | ICD-10-CM | POA: Insufficient documentation

## 2015-08-21 DIAGNOSIS — Z7951 Long term (current) use of inhaled steroids: Secondary | ICD-10-CM | POA: Insufficient documentation

## 2015-08-21 DIAGNOSIS — I10 Essential (primary) hypertension: Secondary | ICD-10-CM | POA: Insufficient documentation

## 2015-08-21 DIAGNOSIS — Z87828 Personal history of other (healed) physical injury and trauma: Secondary | ICD-10-CM | POA: Insufficient documentation

## 2015-08-21 DIAGNOSIS — Z8742 Personal history of other diseases of the female genital tract: Secondary | ICD-10-CM | POA: Insufficient documentation

## 2015-08-21 DIAGNOSIS — E669 Obesity, unspecified: Secondary | ICD-10-CM | POA: Insufficient documentation

## 2015-08-21 DIAGNOSIS — D649 Anemia, unspecified: Secondary | ICD-10-CM | POA: Insufficient documentation

## 2015-08-21 DIAGNOSIS — Z7952 Long term (current) use of systemic steroids: Secondary | ICD-10-CM | POA: Insufficient documentation

## 2015-08-21 DIAGNOSIS — F17210 Nicotine dependence, cigarettes, uncomplicated: Secondary | ICD-10-CM | POA: Insufficient documentation

## 2015-08-21 DIAGNOSIS — Z79899 Other long term (current) drug therapy: Secondary | ICD-10-CM | POA: Insufficient documentation

## 2015-08-21 MED ORDER — ALBUTEROL SULFATE (2.5 MG/3ML) 0.083% IN NEBU: 5.0000 mg | INHALATION_SOLUTION | Freq: Once | RESPIRATORY_TRACT | Status: DC

## 2015-08-21 MED ORDER — PREDNISONE 20 MG PO TABS: 40.0000 mg | ORAL_TABLET | Freq: Every day | ORAL | Status: DC

## 2015-08-21 MED ORDER — MOMETASONE FURO-FORMOTEROL FUM 200-5 MCG/ACT IN AERO: 2.0000 | INHALATION_SPRAY | Freq: Two times a day (BID) | RESPIRATORY_TRACT | Status: DC

## 2015-08-21 MED ORDER — ALBUTEROL SULFATE HFA 108 (90 BASE) MCG/ACT IN AERS
2.0000 | INHALATION_SPRAY | Freq: Once | RESPIRATORY_TRACT | Status: AC
  Administered 2015-08-21: 2 via RESPIRATORY_TRACT
  Filled 2015-08-21: qty 6.7

## 2015-08-21 MED ORDER — MORPHINE SULFATE (PF) 4 MG/ML IV SOLN
4.0000 mg | Freq: Once | INTRAVENOUS | Status: AC
  Administered 2015-08-21: 4 mg via INTRAVENOUS
  Filled 2015-08-21: qty 1

## 2015-08-21 MED ORDER — MORPHINE SULFATE (PF) 4 MG/ML IV SOLN: 4.0000 mg | Freq: Once | INTRAVENOUS | Status: DC | PRN

## 2015-08-21 MED ORDER — PREDNISONE 20 MG PO TABS
60.0000 mg | ORAL_TABLET | Freq: Once | ORAL | Status: DC
  Filled 2015-08-21: qty 3

## 2015-08-21 MED ORDER — ALBUTEROL SULFATE HFA 108 (90 BASE) MCG/ACT IN AERS: 2.0000 | INHALATION_SPRAY | Freq: Four times a day (QID) | RESPIRATORY_TRACT | Status: DC | PRN

## 2015-08-21 NOTE — Progress Notes (Signed)
Orthopedic Tech Progress Note Patient Details:  Anne Schaefer [birth year] [id number]  Ortho Devices Type of Ortho Device: Ace wrap, Post (short leg) splint, Stirrup splint Ortho Device/Splint Location: rle Ortho Device/Splint Interventions: Application   Ladarrell Cornwall 08/21/2015, 8:47 AM

## 2015-08-21 NOTE — Care Management Note (Signed)
Case Management Note  Patient Details  Name: DEVYNE BREIDINGER MRN: [id number] Date of Birth: August 05, 1970  Subjective/Objective:                  45 year old female who presents emergency Department with complaint of asthma exacerbation. She is followed at [facility].// Home with friend.  Action/Plan: Follow for disposition needs.   Expected Discharge Date:    08/21/15              Expected Discharge Plan:  Home/Self Care; obtain follow-up appointment   In-House Referral:  NA  Discharge planning Services  CM Consult, Follow-up appt scheduled  Post Acute Care Choice:  NA Choice offered to:  NA  DME Arranged:  N/A DME Agency:  NA  HH Arranged:  NA HH Agency:  NA  Status of Service:  Completed, signed off  Medicare Important Message Given:    Date Medicare IM Given:    Medicare IM give by:    Date Additional Medicare IM Given:    Additional Medicare Important Message give by:     If discussed at [place] of Stay Meetings, dates discussed:    Additional Comments: Jennett Tarbell J. Clydene Laming, RN, BSN, [employer] [phone] [facility] set up appointment at [facility] on 2/22 @ 11:00.  Spoke with pt at bedside and provided brochure with directions and phone number highlighted.  Pt verbalizes understanding of keeping appointment.  Fuller Mandril, RN 08/21/2015, 10:15 AM

## 2015-08-21 NOTE — ED Notes (Signed)
Per EMS pt has had coughing through out the night and woke up to extreme coughing and caused tightness in chest; Pt has hx of asthma but has not been able to get out to get meds due to broken Right ankle; Pt a&ox 4 on arrival; pt has had 125 Solumedrol and 2 x nebs prior to arrival;

## 2015-08-21 NOTE — ED Provider Notes (Signed)
CSN: [id number]     Arrival date & time 08/21/15  0534 History   First MD Initiated Contact with Patient 08/21/15 [phone]     Chief Complaint  Patient presents with  . Asthma     (Consider location/radiation/quality/duration/timing/severity/associated sxs/prior Treatment) HPI 45 year old female who presents emergency Department with complaint of asthma exacerbation. She is followed at the [facility]. Patient states that she had an acute exacerbation of her asthma this morning. She has been taking Dulera and albuterol and states that she has not had any exacerbations which have brought her to the hospital in a very long time, as this has been in failing her symptoms very well. She does smoke about one cigarette a day and has been cutting back over time. She also states she does have seasonal allergies. She denies symptoms of URI. She states she has been unable to get a follow-up appointment with her PCP. Patient is also complaining of pain in the splint of her right leg for a recent ankle fracture. She asked that it be evaluated and resplinted. She denies numbness or tingling in her toes.  Past Medical History  Diagnosis Date  . Asthma   . Obesity   . Seasonal allergies   . Diverticulitis   . Ovarian cyst   . Infection     trich  . Hypertension   . Pneumonia     had 2 times 1997 and 2012  . Headache     migraine in 2015  . Anemia     on Iron   . History of blood transfusion   . Hematoma 01/2015   Past Surgical History  Procedure Laterality Date  . Tubal ligation    . Cesarean section    . Forehead reconstruction      as a child  . Wisdom tooth extraction    . Abdominal hysterectomy N/A 12/31/2014    Procedure: HYSTERECTOMY ABDOMINAL;  Surgeon: Woodroe Mode, MD;  Location: [facility];  Service: Gynecology;  Laterality: N/A;  . Salpingoophorectomy Right 12/31/2014    Procedure: RIGHT SALPINGO OOPHORECTOMY;  Surgeon: Woodroe Mode, MD;  Location: [facility];   Service: Gynecology;  Laterality: Right;   Family History  Problem Relation Age of Onset  . Diabetes Mother   . Heart disease Mother     chf  . Hypertension Father    Social History  Substance Use Topics  . Smoking status: Current Some Day Smoker -- 0.25 packs/day for 8 years    Types: Cigarettes  . Smokeless tobacco: Never Used     Comment: 3-4 cigs per day  . Alcohol Use: 0.0 oz/week     Comment: occ   OB History    Gravida Para Term Preterm AB TAB SAB Ectopic Multiple Living   5 5 4 1  0 0 0 0 0 5     Review of Systems  Ten systems reviewed and are negative for acute change, except as noted in the HPI.   Allergies  Ibuprofen; Azithromycin; and Other  Home Medications   Prior to Admission medications   Medication Sig Start Date End Date Taking? Authorizing Provider  albuterol (PROVENTIL HFA;VENTOLIN HFA) 108 (90 Base) MCG/ACT inhaler Inhale 2 puffs into the lungs every 6 (six) hours as needed for wheezing or shortness of breath. 08/21/15   Margarita Mail, PA-C  ciprofloxacin (CIPRO) 500 MG tablet Take 1 tablet (500 mg total) by mouth 2 (two) times daily. Patient not taking: Reported on 08/10/2015 03/24/15  Emina Riebock, NP  fluconazole (DIFLUCAN) 150 MG tablet Take 1 tablet (150 mg total) by mouth once. Patient not taking: Reported on 08/10/2015 04/01/15   Arnoldo Morale, MD  hydrochlorothiazide (HYDRODIURIL) 25 MG tablet Take 1 tablet (25 mg total) by mouth daily. Patient not taking: Reported on 04/01/2015 03/08/14   Lorayne Marek, MD  metroNIDAZOLE (FLAGYL) 500 MG tablet Take 1 tablet (500 mg total) by mouth every 8 (eight) hours. Patient not taking: Reported on 08/10/2015 03/24/15   Erby Pian, NP  mometasone-formoterol (DULERA) 200-5 MCG/ACT AERO Inhale 2 puffs into the lungs 2 (two) times daily. 08/21/15   Margarita Mail, PA-C  oxyCODONE (OXY IR/ROXICODONE) 5 MG immediate release tablet Take 1 tablet (5 mg total) by mouth every 6 (six) hours as needed for moderate  pain. Patient not taking: Reported on 08/10/2015 03/24/15   Erby Pian, NP  oxyCODONE-acetaminophen (PERCOCET/ROXICET) 5-325 MG tablet Take 1 tablet by mouth every 6 (six) hours as needed for severe pain. Patient not taking: Reported on 08/21/2015 08/10/15   Merryl Hacker, MD  predniSONE (DELTASONE) 20 MG tablet Take 2 tablets (40 mg total) by mouth daily. 08/21/15   Gianne Shugars, PA-C   BP 158/77 mmHg  Pulse 74  Temp(Src) 98.3 F (36.8 C) (Oral)  Resp 16  Ht 5\' 2"  (1.575 m)  Wt 104.327 kg  BMI 42.06 kg/m2  SpO2 96%  LMP 12/05/2014 Physical Exam  Constitutional: She is oriented to person, place, and time. She appears well-developed and well-nourished. No distress.  HENT:  Head: Normocephalic and atraumatic.  Eyes: Conjunctivae are normal. No scleral icterus.  Neck: Normal range of motion.  Cardiovascular: Normal rate, regular rhythm and normal heart sounds.  Exam reveals no gallop and no friction rub.   No murmur heard. Pulmonary/Chest: Effort normal and breath sounds normal. No respiratory distress. She has no wheezes.  Abdominal: Soft. Bowel sounds are normal. She exhibits no distension and no mass. There is no tenderness. There is no guarding.  Neurological: She is alert and oriented to person, place, and time.  Skin: Skin is warm and dry. She is not diaphoretic.  Right leg in splint.  Toes are warm  Nursing note and vitals reviewed.   ED Course  Procedures (including critical care time) Labs Review Labs Reviewed - No data to display  Imaging Review No results found. I have personally reviewed and evaluated these images and lab results as part of my medical decision-making.   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      Margarita Mail, PA-C 08/24/15 [place], MD 09/01/15 [phone]

## 2015-08-21 NOTE — ED Notes (Signed)
Paged ortho to resplint pt's leg.

## 2015-08-21 NOTE — Discharge Instructions (Signed)
Bronchospasm, Adult  A bronchospasm is a spasm or tightening of the airways going into the lungs. During a bronchospasm breathing becomes more difficult because the airways get smaller. When this happens there can be coughing, a whistling sound when breathing (wheezing), and difficulty breathing. Bronchospasm is often associated with asthma, but not all patients who experience a bronchospasm have asthma.  CAUSES   A bronchospasm is caused by inflammation or irritation of the airways. The inflammation or irritation may be triggered by:   · Allergies (such as to animals, pollen, food, or mold). Allergens that cause bronchospasm may cause wheezing immediately after exposure or many hours later.    · Infection. Viral infections are believed to be the most common cause of bronchospasm.    · Exercise.    · Irritants (such as pollution, cigarette smoke, strong odors, aerosol sprays, and paint fumes).    · Weather changes. Winds increase molds and pollens in the air. Rain refreshes the air by washing irritants out. Cold air may cause inflammation.    · Stress and emotional upset.    SIGNS AND SYMPTOMS   · Wheezing.    · Excessive nighttime coughing.    · Frequent or severe coughing with a simple cold.    · Chest tightness.    · Shortness of breath.    DIAGNOSIS   Bronchospasm is usually diagnosed through a history and physical exam. Tests, such as chest X-rays, are sometimes done to look for other conditions.  TREATMENT   · Inhaled medicines can be given to open up your airways and help you breathe. The medicines can be given using either an inhaler or a nebulizer machine.  · Corticosteroid medicines may be given for severe bronchospasm, usually when it is associated with asthma.  HOME CARE INSTRUCTIONS   · Always have a plan prepared for seeking medical care. Know when to call your health care provider and local emergency services (911 in the [place] Know where you can access local emergency care.  · Only take medicines as  directed by your health care provider.  · If you were prescribed an inhaler or nebulizer machine, ask your health care provider to explain how to use it correctly. Always use a spacer with your inhaler if you were given one.  · It is necessary to remain calm during an attack. Try to relax and breathe more slowly.   · Control your home environment in the following ways:      Change your heating and air conditioning filter at least once a month.      Limit your use of fireplaces and wood stoves.    Do not smoke and do not allow smoking in your home.      Avoid exposure to perfumes and fragrances.      Get rid of pests (such as roaches and mice) and their droppings.      Throw away plants if you see mold on them.      Keep your house clean and dust free.      Replace carpet with wood, tile, or vinyl flooring. Carpet can trap dander and dust.      Use allergy-proof pillows, mattress covers, and box spring covers.      Wash bed sheets and blankets every week in hot water and dry them in a dryer.      Use blankets that are made of polyester or cotton.      Wash hands frequently.  SEEK MEDICAL CARE IF:   · You have muscle aches.    · You have chest pain.    · The sputum changes from clear or   white to yellow, green, gray, or bloody.    · The sputum you cough up gets thicker.    · There are problems that may be related to the medicine you are given, such as a rash, itching, swelling, or trouble breathing.    SEEK IMMEDIATE MEDICAL CARE IF:   · You have worsening wheezing and coughing even after taking your prescribed medicines.    · You have increased difficulty breathing.    · You develop severe chest pain.  MAKE SURE YOU:   · Understand these instructions.  · Will watch your condition.  · Will get help right away if you are not doing well or get worse.     This information is not intended to replace advice given to you by your health care provider. Make sure you discuss any questions you have with your health care  provider.     Document Released: 07/01/2003 Document Revised: 07/19/2014 Document Reviewed: 12/18/2012  Elsevier Interactive Patient Education ©2016 [employer]

## 2015-08-21 NOTE — ED Notes (Signed)
Called Dr. Roxanne Mins to report patient originally came in for asthma, but also has right ankle in splint prior to arrival. Assisted to the restroom, and now patient is complaining of severe pain in splinted leg. MD gives verbal order for 4mg  of morphine now, and may repeat dose if first dose is insufficient.

## 2015-09-03 ENCOUNTER — Encounter: Payer: Self-pay | Admitting: Family Medicine

## 2015-09-03 ENCOUNTER — Ambulatory Visit: Payer: Self-pay | Attending: Family Medicine | Admitting: Family Medicine

## 2015-09-03 VITALS — BP 180/110 | HR 73 | Temp 98.5°F | Resp 15 | Ht 62.0 in | Wt 239.6 lb

## 2015-09-03 DIAGNOSIS — J4520 Mild intermittent asthma, uncomplicated: Secondary | ICD-10-CM

## 2015-09-03 DIAGNOSIS — S82401D Unspecified fracture of shaft of right fibula, subsequent encounter for closed fracture with routine healing: Secondary | ICD-10-CM | POA: Insufficient documentation

## 2015-09-03 DIAGNOSIS — S82401A Unspecified fracture of shaft of right fibula, initial encounter for closed fracture: Secondary | ICD-10-CM

## 2015-09-03 DIAGNOSIS — R911 Solitary pulmonary nodule: Secondary | ICD-10-CM

## 2015-09-03 DIAGNOSIS — J45901 Unspecified asthma with (acute) exacerbation: Secondary | ICD-10-CM | POA: Insufficient documentation

## 2015-09-03 DIAGNOSIS — I10 Essential (primary) hypertension: Secondary | ICD-10-CM

## 2015-09-03 DIAGNOSIS — Z79899 Other long term (current) drug therapy: Secondary | ICD-10-CM | POA: Insufficient documentation

## 2015-09-03 MED ORDER — ALBUTEROL SULFATE HFA 108 (90 BASE) MCG/ACT IN AERS: 2.0000 | INHALATION_SPRAY | Freq: Four times a day (QID) | RESPIRATORY_TRACT | Status: DC | PRN

## 2015-09-03 MED ORDER — AMLODIPINE BESYLATE 10 MG PO TABS: 10.0000 mg | ORAL_TABLET | Freq: Every day | ORAL | Status: DC

## 2015-09-03 MED ORDER — MOMETASONE FURO-FORMOTEROL FUM 200-5 MCG/ACT IN AERO: 2.0000 | INHALATION_SPRAY | Freq: Two times a day (BID) | RESPIRATORY_TRACT | Status: DC

## 2015-09-03 NOTE — Patient Instructions (Signed)
Hypertension Hypertension, commonly called high blood pressure, is when the force of blood pumping through your arteries is too strong. Your arteries are the blood vessels that carry blood from your heart throughout your body. A blood pressure reading consists of a higher number over a lower number, such as 110/72. The higher number (systolic) is the pressure inside your arteries when your heart pumps. The lower number (diastolic) is the pressure inside your arteries when your heart relaxes. Ideally you want your blood pressure below 120/80. Hypertension forces your heart to work harder to pump blood. Your arteries may become narrow or stiff. Having untreated or uncontrolled hypertension can cause heart attack, stroke, kidney disease, and other problems. RISK FACTORS Some risk factors for high blood pressure are controllable. Others are not.  Risk factors you cannot control include:   Race. You may be at higher risk if you are African American.  Age. Risk increases with age.  Gender. Men are at higher risk than women before age 45 years. After age 65, women are at higher risk than men. Risk factors you can control include:  Not getting enough exercise or physical activity.  Being overweight.  Getting too much fat, sugar, calories, or salt in your diet.  Drinking too much alcohol. SIGNS AND SYMPTOMS Hypertension does not usually cause signs or symptoms. Extremely high blood pressure (hypertensive crisis) may cause headache, anxiety, shortness of breath, and nosebleed. DIAGNOSIS To check if you have hypertension, your health care provider will measure your blood pressure while you are seated, with your arm held at the level of your heart. It should be measured at least twice using the same arm. Certain conditions can cause a difference in blood pressure between your right and left arms. A blood pressure reading that is higher than normal on one occasion does not mean that you need treatment. If  it is not clear whether you have high blood pressure, you may be asked to return on a different day to have your blood pressure checked again. Or, you may be asked to monitor your blood pressure at home for 1 or more weeks. TREATMENT Treating high blood pressure includes making lifestyle changes and possibly taking medicine. Living a healthy lifestyle can help lower high blood pressure. You may need to change some of your habits. Lifestyle changes may include:  Following the DASH diet. This diet is high in fruits, vegetables, and whole grains. It is low in salt, red meat, and added sugars.  Keep your sodium intake below 2,300 mg per day.  Getting at least 30-45 minutes of aerobic exercise at least 4 times per week.  Losing weight if necessary.  Not smoking.  Limiting alcoholic beverages.  Learning ways to reduce stress. Your health care provider may prescribe medicine if lifestyle changes are not enough to get your blood pressure under control, and if one of the following is true:  You are 18-59 years of age and your systolic blood pressure is above 140.  You are 60 years of age or older, and your systolic blood pressure is above 150.  Your diastolic blood pressure is above 90.  You have diabetes, and your systolic blood pressure is over 140 or your diastolic blood pressure is over 90.  You have kidney disease and your blood pressure is above 140/90.  You have heart disease and your blood pressure is above 140/90. Your personal target blood pressure may vary depending on your medical conditions, your age, and other factors. HOME CARE INSTRUCTIONS    Have your blood pressure rechecked as directed by your health care provider.   Take medicines only as directed by your health care provider. Follow the directions carefully. Blood pressure medicines must be taken as prescribed. The medicine does not work as well when you skip doses. Skipping doses also puts you at risk for  problems.  Do not smoke.   Monitor your blood pressure at home as directed by your health care provider. SEEK MEDICAL CARE IF:   You think you are having a reaction to medicines taken.  You have recurrent headaches or feel dizzy.  You have swelling in your ankles.  You have trouble with your vision. SEEK IMMEDIATE MEDICAL CARE IF:  You develop a severe headache or confusion.  You have unusual weakness, numbness, or feel faint.  You have severe chest or abdominal pain.  You vomit repeatedly.  You have trouble breathing. MAKE SURE YOU:   Understand these instructions.  Will watch your condition.  Will get help right away if you are not doing well or get worse.   This information is not intended to replace advice given to you by your health care provider. Make sure you discuss any questions you have with your health care provider.   Document Released: 06/28/2005 Document Revised: 11/12/2014 Document Reviewed: 04/20/2013 Elsevier Interactive Patient Education 2016 [employer] Inc.  

## 2015-09-03 NOTE — Progress Notes (Signed)
Subjective:  Patient ID: Anne Schaefer, female    DOB: [birth year]  Age: 45 y.o. MRN: [id number]  CC: Follow-up   HPI Anne Schaefer presents for an ED follow-up after presentation for asthma exacerbation which she had after running out of her asthma medications. Chest x-ray done revealed mild vascular congestion, mild diabetes mellitus lactase is, 1.1 cm nodule at the right lung base, CT chest recommended for further evaluation. She has picked Dulera up and her Proventil and reports that her symptoms are well controlled with no shortness of breath or wheezing. Her blood pressure is severely elevated today and she reports not taking her hydrochlorothiazide for. Having to use the bathroom frequently.  She also has a right fibular fracture and is currently wearing a cast and ambulating with the aid of crutches. Denies tingling or numbness in her toes. Scheduled to see orthopedics the second week in March.  Outpatient Prescriptions Prior to Visit  Medication Sig Dispense Refill  . oxyCODONE (OXY IR/ROXICODONE) 5 MG immediate release tablet Take 1 tablet (5 mg total) by mouth every 6 (six) hours as needed for moderate pain. 30 tablet 0  . oxyCODONE-acetaminophen (PERCOCET/ROXICET) 5-325 MG tablet Take 1 tablet by mouth every 6 (six) hours as needed for severe pain. 15 tablet 0  . albuterol (PROVENTIL HFA;VENTOLIN HFA) 108 (90 Base) MCG/ACT inhaler Inhale 2 puffs into the lungs every 6 (six) hours as needed for wheezing or shortness of breath. 1 Inhaler 2  . mometasone-formoterol (DULERA) 200-5 MCG/ACT AERO Inhale 2 puffs into the lungs 2 (two) times daily. 1 Inhaler 0  . ciprofloxacin (CIPRO) 500 MG tablet Take 1 tablet (500 mg total) by mouth 2 (two) times daily. (Patient not taking: Reported on 08/10/2015) 18 tablet 0  . fluconazole (DIFLUCAN) 150 MG tablet Take 1 tablet (150 mg total) by mouth once. (Patient not taking: Reported on 08/10/2015) 1 tablet 0  . hydrochlorothiazide (HYDRODIURIL) 25  MG tablet Take 1 tablet (25 mg total) by mouth daily. (Patient not taking: Reported on 04/01/2015) 90 tablet 3  . metroNIDAZOLE (FLAGYL) 500 MG tablet Take 1 tablet (500 mg total) by mouth every 8 (eight) hours. (Patient not taking: Reported on 08/10/2015) 27 tablet 0  . predniSONE (DELTASONE) 20 MG tablet Take 2 tablets (40 mg total) by mouth daily. 10 tablet 0   No facility-administered medications prior to visit.    ROS Review of Systems  Constitutional: Negative for activity change and appetite change.  HENT: Negative for sinus pressure and sore throat.   Respiratory: Negative for chest tightness, shortness of breath and wheezing.   Cardiovascular: Negative for chest pain and palpitations.  Gastrointestinal: Negative for abdominal pain, constipation and abdominal distention.  Genitourinary: Negative.   Musculoskeletal:       See history of present illness  Psychiatric/Behavioral: Negative for behavioral problems and dysphoric mood.    Objective:  BP 180/110 mmHg  Pulse 73  Temp(Src) 98.5 F (36.9 C)  Resp 15  Ht 5\' 2"  (1.575 m)  Wt 239 lb 9.6 oz (108.682 kg)  BMI 43.81 kg/m2  SpO2 97%  LMP 12/05/2014  BP/Weight 09/03/2015 08/21/2015 [id number]  Systolic BP [id number]  Diastolic BP A999333 77 92  Wt. (Lbs) 239.6 230 -  BMI 43.81 42.06 -      Physical Exam  Constitutional: She is oriented to person, place, and time. She appears well-developed and well-nourished.  Cardiovascular: Normal rate, normal heart sounds and intact distal pulses.   No murmur heard. Pulmonary/Chest:  Effort normal and breath sounds normal. She has no wheezes. She has no rales. She exhibits no tenderness.  Abdominal: Soft. Bowel sounds are normal. She exhibits no distension and no mass. There is no tenderness.  Musculoskeletal:  Right ankle in a cast  Neurological: She is alert and oriented to person, place, and time.     Assessment & Plan:   1. Essential hypertension, benign Uncontrolled due to  patient missing her morning dose of antihypertensive. We'll switch from HCTZ to amlodipine as she complains of frequent urination with the former - amLODipine (NORVASC) 10 MG tablet; Take 1 tablet (10 mg total) by mouth daily.  Dispense: 30 tablet; Refill: 3 - COMPLETE METABOLIC PANEL WITH GFR - Lipid panel  2. Asthma, mild intermittent, uncomplicated Controlled - mometasone-formoterol (DULERA) 200-5 MCG/ACT AERO; Inhale 2 puffs into the lungs 2 (two) times daily.  Dispense: 1 Inhaler; Refill: 2 - albuterol (PROVENTIL HFA;VENTOLIN HFA) 108 (90 Base) MCG/ACT inhaler; Inhale 2 puffs into the lungs every 6 (six) hours as needed for wheezing or shortness of breath.  Dispense: 1 Inhaler; Refill: 3  3. Right fibular fracture, closed, initial encounter Currently in the right ankle cast and will be seeing orthopedics in 2 weeks.  4. Right lung nodule Will order CT chest at her next office visit by which time she will be out of her cast and will be able to ambulate in order to make appointment  Meds ordered this encounter  Medications  . mometasone-formoterol (DULERA) 200-5 MCG/ACT AERO    Sig: Inhale 2 puffs into the lungs 2 (two) times daily.    Dispense:  1 Inhaler    Refill:  2  . albuterol (PROVENTIL HFA;VENTOLIN HFA) 108 (90 Base) MCG/ACT inhaler    Sig: Inhale 2 puffs into the lungs every 6 (six) hours as needed for wheezing or shortness of breath.    Dispense:  1 Inhaler    Refill:  3  . amLODipine (NORVASC) 10 MG tablet    Sig: Take 1 tablet (10 mg total) by mouth daily.    Dispense:  30 tablet    Refill:  3    Discontinue hydrochlorothiazide    Follow-up: One month for follow-up of hypertension.   Arnoldo Morale MD

## 2015-09-03 NOTE — Progress Notes (Signed)
Patient here for follow up on her asthma She went to ED for flare up but was out of her medications She has picked them up now She reports not taking her HCTZ because she did not want to be "in the bathroom all day"

## 2015-09-04 ENCOUNTER — Telehealth: Payer: Self-pay | Admitting: *Deleted

## 2015-09-04 LAB — COMPLETE METABOLIC PANEL WITH GFR
ALBUMIN: 3.7 g/dL (ref 3.6–5.1)
ALK PHOS: 48 U/L (ref 33–115)
ALT: 12 U/L (ref 6–29)
AST: 9 U/L — AB (ref 10–30)
BUN: 7 mg/dL (ref 7–25)
CALCIUM: 8.9 mg/dL (ref 8.6–10.2)
CHLORIDE: 104 mmol/L (ref 98–110)
CO2: 23 mmol/L (ref 20–31)
Creat: 0.74 mg/dL (ref 0.50–1.10)
GFR, Est African American: >89 mL/min (ref >=60)
GLUCOSE: 87 mg/dL (ref 65–99)
POTASSIUM: 4.2 mmol/L (ref 3.5–5.3)
Sodium: 137 mmol/L (ref 135–146)
Total Bilirubin: 0.4 mg/dL (ref 0.2–1.2)
Total Protein: 6.5 g/dL (ref 6.1–8.1)

## 2015-09-04 LAB — LIPID PANEL
CHOL/HDL RATIO: 4 ratio (ref <=5.0)
CHOLESTEROL: 172 mg/dL (ref 125–200)
HDL: 43 mg/dL — AB (ref >=46)
LDL Cholesterol: 108 mg/dL (ref <130)
Triglycerides: 103 mg/dL (ref <150)
VLDL: 21 mg/dL (ref <30)

## 2015-09-04 NOTE — Telephone Encounter (Signed)
Verified name and date of birth and gave normal lab results

## 2015-09-04 NOTE — Telephone Encounter (Signed)
-----   Message from Arnoldo Morale, MD sent at 09/04/2015  8:26 AM EST ----- Please inform the patient that labs are normal. Thank you.

## 2015-09-22 ENCOUNTER — Emergency Department (HOSPITAL_COMMUNITY)
Admission: EM | Admit: 2015-09-22 | Discharge: 2015-09-22 | Disposition: A | Discharge status: Home / Self Care | Payer: Self-pay | Attending: Emergency Medicine | Admitting: Emergency Medicine

## 2015-09-22 ENCOUNTER — Emergency Department (HOSPITAL_COMMUNITY): Payer: Self-pay

## 2015-09-22 ENCOUNTER — Encounter (HOSPITAL_COMMUNITY): Payer: Self-pay | Admitting: Vascular Surgery

## 2015-09-22 DIAGNOSIS — K5732 Diverticulitis of large intestine without perforation or abscess without bleeding: Secondary | ICD-10-CM | POA: Insufficient documentation

## 2015-09-22 DIAGNOSIS — R935 Abnormal findings on diagnostic imaging of other abdominal regions, including retroperitoneum: Secondary | ICD-10-CM

## 2015-09-22 DIAGNOSIS — Z79899 Other long term (current) drug therapy: Secondary | ICD-10-CM | POA: Insufficient documentation

## 2015-09-22 DIAGNOSIS — Z8742 Personal history of other diseases of the female genital tract: Secondary | ICD-10-CM | POA: Insufficient documentation

## 2015-09-22 DIAGNOSIS — F17210 Nicotine dependence, cigarettes, uncomplicated: Secondary | ICD-10-CM | POA: Insufficient documentation

## 2015-09-22 DIAGNOSIS — Z87828 Personal history of other (healed) physical injury and trauma: Secondary | ICD-10-CM | POA: Insufficient documentation

## 2015-09-22 DIAGNOSIS — I10 Essential (primary) hypertension: Secondary | ICD-10-CM | POA: Insufficient documentation

## 2015-09-22 DIAGNOSIS — Z8701 Personal history of pneumonia (recurrent): Secondary | ICD-10-CM | POA: Insufficient documentation

## 2015-09-22 DIAGNOSIS — E669 Obesity, unspecified: Secondary | ICD-10-CM | POA: Insufficient documentation

## 2015-09-22 DIAGNOSIS — Z9851 Tubal ligation status: Secondary | ICD-10-CM | POA: Insufficient documentation

## 2015-09-22 DIAGNOSIS — Z8619 Personal history of other infectious and parasitic diseases: Secondary | ICD-10-CM | POA: Insufficient documentation

## 2015-09-22 DIAGNOSIS — J45909 Unspecified asthma, uncomplicated: Secondary | ICD-10-CM | POA: Insufficient documentation

## 2015-09-22 DIAGNOSIS — Z7951 Long term (current) use of inhaled steroids: Secondary | ICD-10-CM | POA: Insufficient documentation

## 2015-09-22 DIAGNOSIS — Z90710 Acquired absence of both cervix and uterus: Secondary | ICD-10-CM | POA: Insufficient documentation

## 2015-09-22 LAB — COMPREHENSIVE METABOLIC PANEL
ALT: 24 U/L (ref 14–54)
ANION GAP: 19 — AB (ref 5–15)
AST: 20 U/L (ref 15–41)
Albumin: 3.7 g/dL (ref 3.5–5.0)
Alkaline Phosphatase: 58 U/L (ref 38–126)
BILIRUBIN TOTAL: 0.6 mg/dL (ref 0.3–1.2)
BUN: 8 mg/dL (ref 6–20)
CHLORIDE: 102 mmol/L (ref 101–111)
CO2: 23 mmol/L (ref 22–32)
CREATININE: 0.79 mg/dL (ref 0.44–1.00)
Calcium: 10.4 mg/dL — ABNORMAL HIGH (ref 8.9–10.3)
Glucose, Bld: 93 mg/dL (ref 65–99)
POTASSIUM: 4.1 mmol/L (ref 3.5–5.1)
Sodium: 144 mmol/L (ref 135–145)
TOTAL PROTEIN: 7.1 g/dL (ref 6.5–8.1)

## 2015-09-22 LAB — CBC
HCT: 38.9 % (ref 36.0–46.0)
Hemoglobin: 12.8 g/dL (ref 12.0–15.0)
MCH: 28.2 pg (ref 26.0–34.0)
MCHC: 32.9 g/dL (ref 30.0–36.0)
MCV: 85.7 fL (ref 78.0–100.0)
PLATELETS: 337 10*3/uL (ref 150–400)
RBC: 4.54 MIL/uL (ref 3.87–5.11)
RDW: 14.8 % (ref 11.5–15.5)
WBC: 11.3 10*3/uL — AB (ref 4.0–10.5)

## 2015-09-22 LAB — URINALYSIS, ROUTINE W REFLEX MICROSCOPIC
Bilirubin Urine: NEGATIVE
Glucose, UA: NEGATIVE mg/dL
Hgb urine dipstick: NEGATIVE
KETONES UR: NEGATIVE mg/dL
LEUKOCYTES UA: NEGATIVE
NITRITE: NEGATIVE
PROTEIN: NEGATIVE mg/dL
Specific Gravity, Urine: 1.016 (ref 1.005–1.030)
pH: 5.5 (ref 5.0–8.0)

## 2015-09-22 LAB — LIPASE, BLOOD: LIPASE: 19 U/L (ref 11–51)

## 2015-09-22 MED ORDER — MORPHINE SULFATE (PF) 4 MG/ML IV SOLN
4.0000 mg | Freq: Once | INTRAVENOUS | Status: AC
  Administered 2015-09-22: 4 mg via INTRAVENOUS
  Filled 2015-09-22: qty 1

## 2015-09-22 MED ORDER — METRONIDAZOLE 500 MG PO TABS
500.0000 mg | ORAL_TABLET | Freq: Once | ORAL | Status: AC
  Administered 2015-09-22: 500 mg via ORAL
  Filled 2015-09-22: qty 1

## 2015-09-22 MED ORDER — HYDROMORPHONE HCL 1 MG/ML IJ SOLN
1.0000 mg | Freq: Once | INTRAMUSCULAR | Status: AC
  Administered 2015-09-22: 1 mg via INTRAVENOUS
  Filled 2015-09-22: qty 1

## 2015-09-22 MED ORDER — IOHEXOL 300 MG/ML  SOLN
100.0000 mL | Freq: Once | INTRAMUSCULAR | Status: AC | PRN
  Administered 2015-09-22: 100 mL via INTRAVENOUS

## 2015-09-22 MED ORDER — CIPROFLOXACIN HCL 500 MG PO TABS: 500.0000 mg | ORAL_TABLET | Freq: Two times a day (BID) | ORAL | Status: DC

## 2015-09-22 MED ORDER — IOHEXOL 300 MG/ML  SOLN
25.0000 mL | Freq: Once | INTRAMUSCULAR | Status: DC | PRN
  Administered 2015-09-22: 25 mL via ORAL
  Filled 2015-09-22: qty 30

## 2015-09-22 MED ORDER — SODIUM CHLORIDE 0.9 % IV BOLUS (SEPSIS)
1000.0000 mL | Freq: Once | INTRAVENOUS | Status: AC
  Administered 2015-09-22: 1000 mL via INTRAVENOUS

## 2015-09-22 MED ORDER — ONDANSETRON 4 MG PO TBDP
4.0000 mg | ORAL_TABLET | Freq: Once | ORAL | Status: AC | PRN
  Administered 2015-09-22: 4 mg via ORAL

## 2015-09-22 MED ORDER — OXYCODONE-ACETAMINOPHEN 5-325 MG PO TABS: 1.0000 | ORAL_TABLET | Freq: Three times a day (TID) | ORAL | Status: DC | PRN

## 2015-09-22 MED ORDER — ONDANSETRON 4 MG PO TBDP
ORAL_TABLET | ORAL | Status: AC
  Filled 2015-09-22: qty 1

## 2015-09-22 MED ORDER — CIPROFLOXACIN HCL 500 MG PO TABS
500.0000 mg | ORAL_TABLET | Freq: Once | ORAL | Status: AC
  Administered 2015-09-22: 500 mg via ORAL
  Filled 2015-09-22: qty 1

## 2015-09-22 MED ORDER — METRONIDAZOLE 500 MG PO TABS
500.0000 mg | ORAL_TABLET | Freq: Three times a day (TID) | ORAL | Status: DC
Start: 2015-09-22 — End: 2015-12-24

## 2015-09-22 NOTE — ED Notes (Signed)
Pt reports to the ED for eval of lower abd pain. She has hx of diverticulitis and reports this feels similar to this pain. Pt reports nausea but denies any active V/D. Pt A&Ox4, resp e/u, and skin warm and dry.

## 2015-09-22 NOTE — Discharge Instructions (Signed)
Please read and follow all provided instructions.  Your diagnoses today include:  1. Diverticulitis of large intestine without perforation or abscess without bleeding   2. Abnormal CT scan, pelvis    Tests performed today include:  Blood counts and electrolytes  Blood tests to check liver and kidney function  Blood tests to check pancreas function  Urine test to look for infection and pregnancy (in women)  Vital signs. See below for your results today.   Medications prescribed:   Take any prescribed medications only as directed.  Home care instructions:   Follow any educational materials contained in this packet.  Follow-up instructions: Please follow-up with your primary care provider in the next week if symptoms persist.     Return instructions:  SEEK IMMEDIATE MEDICAL ATTENTION IF:  The pain does not go away or becomes severe   A temperature above 101F develops   Repeated vomiting occurs (multiple episodes)   The pain becomes localized to portions of the abdomen. The right side could possibly be appendicitis. In an adult, the left lower portion of the abdomen could be colitis or diverticulitis.   Blood is being passed in stools or vomit (bright red or black tarry stools)   You develop chest pain, difficulty breathing, dizziness or fainting, or become confused, poorly responsive, or inconsolable (young children)  If you have any other emergent concerns regarding your health  Additional Information: Abdominal (belly) pain can be caused by many things. Your caregiver performed an examination and possibly ordered blood/urine tests and imaging (CT scan, x-rays, ultrasound). Many cases can be observed and treated at home after initial evaluation in the emergency department. Even though you are being discharged home, abdominal pain can be unpredictable. Therefore, you need a repeated exam if your pain does not resolve, returns, or worsens. Most patients with abdominal pain  don't have to be admitted to the hospital or have surgery, but serious problems like appendicitis and gallbladder attacks can start out as nonspecific pain. Many abdominal conditions cannot be diagnosed in one visit, so follow-up evaluations are very important.  Your vital signs today were: BP 139/91 mmHg   Pulse 101   Temp(Src) 98.8 F (37.1 C) (Oral)   Resp 20   SpO2 96%   LMP 12/05/2014 If your blood pressure (bp) was elevated above 135/85 this visit, please have this repeated by your doctor within one month. --------------

## 2015-09-22 NOTE — ED Provider Notes (Signed)
CSN: [id number]     Arrival date & time 09/22/15  1346 History  By signing my name below, I, [name], attest that this documentation has been prepared under the direction and in the presence of non-physician practitioner, Shary Decamp, PA-C. Electronically Signed: [name], Scribe. 09/22/2015. 6:39 PM.    Chief Complaint  Patient presents with  . Abdominal Pain    The history is provided by the patient. No language interpreter was used.    HPI Comments: Anne Schaefer is a 45 y.o. female with h/o diverticulitis, ovarian cyst, and HTN who presents to the Emergency Department complaining of sudden onset, constant, severe, throbbing, 10/10, left lower abdominal pain beginning yesterday. Pt states that her current symptoms feel like diverticulitis. She notes that she experienced mild abdominal pain yesterday and took pain medications and rested with mild relief. She reports that she woke up today with severe abdominal pain and began coughing with significant exacerbation of her abdominal pain. Pt also notes that her abdominal pain is worsened with any kind of movement, flatulence, and palpation. Pt reports that she felt hot yesterday but did not take her temperature. She endorses mild nausea as well and took Zofran in the ER with relief. She denies vaginal bleeding, vaginal discharge, dysuria, CP, SOB, vomiting, diarrhea, hematochezia, or any other associated symptoms. Pt had an abdominal hysterectomy in 2016.   Past Medical History  Diagnosis Date  . Asthma   . Obesity   . Seasonal allergies   . Diverticulitis   . Ovarian cyst   . Infection     trich  . Hypertension   . Pneumonia     had 2 times 1997 and 2012  . Headache     migraine in 2015  . Anemia     on Iron   . History of blood transfusion   . Hematoma 01/2015   Past Surgical History  Procedure Laterality Date  . Tubal ligation    . Cesarean section    . Forehead reconstruction      as a child  . Wisdom tooth  extraction    . Abdominal hysterectomy N/A 12/31/2014    Procedure: HYSTERECTOMY ABDOMINAL;  Surgeon: Woodroe Mode, MD;  Location: [facility];  Service: Gynecology;  Laterality: N/A;  . Salpingoophorectomy Right 12/31/2014    Procedure: RIGHT SALPINGO OOPHORECTOMY;  Surgeon: Woodroe Mode, MD;  Location: [facility];  Service: Gynecology;  Laterality: Right;   Family History  Problem Relation Age of Onset  . Diabetes Mother   . Heart disease Mother     chf  . Hypertension Father    Social History  Substance Use Topics  . Smoking status: Current Some Day Smoker -- 0.25 packs/day for 8 years    Types: Cigarettes  . Smokeless tobacco: Never Used     Comment: 3-4 cigs per day  . Alcohol Use: 0.0 oz/week     Comment: occ   OB History    Gravida Para Term Preterm AB TAB SAB Ectopic Multiple Living   5 5 4 1  0 0 0 0 0 5     Review of Systems  A complete 10 system review of systems was obtained and all systems are negative except as noted in the HPI and PMH.    Allergies  Ibuprofen; Azithromycin; and Other  Home Medications   Prior to Admission medications   Medication Sig Start Date End Date Taking? Authorizing Provider  albuterol (PROVENTIL HFA;VENTOLIN HFA) 108 (90 Base) MCG/ACT inhaler  Inhale 2 puffs into the lungs every 6 (six) hours as needed for wheezing or shortness of breath. 09/03/15   Arnoldo Morale, MD  amLODipine (NORVASC) 10 MG tablet Take 1 tablet (10 mg total) by mouth daily. 09/03/15   Arnoldo Morale, MD  mometasone-formoterol (DULERA) 200-5 MCG/ACT AERO Inhale 2 puffs into the lungs 2 (two) times daily. 09/03/15   Arnoldo Morale, MD  oxyCODONE (OXY IR/ROXICODONE) 5 MG immediate release tablet Take 1 tablet (5 mg total) by mouth every 6 (six) hours as needed for moderate pain. 03/24/15   Emina Riebock, NP  oxyCODONE-acetaminophen (PERCOCET/ROXICET) 5-325 MG tablet Take 1 tablet by mouth every 6 (six) hours as needed for severe pain. 08/10/15   Merryl Hacker, MD   BP 139/91 mmHg   Pulse 101  Temp(Src) 98.8 F (37.1 C) (Oral)  Resp 20  SpO2 96%  LMP 12/05/2014   Physical Exam  Constitutional: She is oriented to person, place, and time. She appears well-developed and well-nourished. No distress.  HENT:  Head: Normocephalic and atraumatic.  Eyes: Conjunctivae and EOM are normal. Pupils are equal, round, and reactive to light.  Neck: Normal range of motion. Neck supple. No tracheal deviation present.  Cardiovascular: Normal rate, regular rhythm and normal heart sounds.   Pulmonary/Chest: Effort normal and breath sounds normal. No respiratory distress. She has no wheezes. She has no rales. She exhibits no tenderness.  Abdominal: Soft. Normal appearance and bowel sounds are normal. There is tenderness in the suprapubic area and left lower quadrant. There is guarding. There is no rigidity, no rebound, no CVA tenderness, no tenderness at McBurney's point and negative Murphy's sign.  Musculoskeletal: Normal range of motion.  Neurological: She is alert and oriented to person, place, and time.  Skin: Skin is warm and dry.  Psychiatric: She has a normal mood and affect. Her behavior is normal.  Nursing note and vitals reviewed.   ED Course  Procedures (including critical care time)  DIAGNOSTIC STUDIES: Oxygen Saturation is 96% on RA, adequate by my interpretation.    COORDINATION OF CARE: 6:39 PM Will administer fluids and morphine 4 mL injection. Discussed treatment plan with pt at bedside and pt agreed to plan.  Labs Review Labs Reviewed  COMPREHENSIVE METABOLIC PANEL - Abnormal; Notable for the following:    Calcium 10.4 (*)    Anion gap 19 (*)    All other components within normal limits  CBC - Abnormal; Notable for the following:    WBC 11.3 (*)    All other components within normal limits  LIPASE, BLOOD  URINALYSIS, ROUTINE W REFLEX MICROSCOPIC (NOT AT [facility])   Imaging Review Ct Abdomen Pelvis W Contrast  09/22/2015  CLINICAL DATA:  Left lower quadrant  pain. Evaluate for diverticulitis EXAM: CT ABDOMEN AND PELVIS WITH CONTRAST TECHNIQUE: Multidetector CT imaging of the abdomen and pelvis was performed using the standard protocol following bolus administration of intravenous contrast. CONTRAST:  73mL OMNIPAQUE IOHEXOL 300 MG/ML SOLN, 169mL OMNIPAQUE IOHEXOL 300 MG/ML SOLN COMPARISON:  03/20/2015 FINDINGS: Lower chest and abdominal wall:  No contributory findings. Hepatobiliary: No focal liver abnormality.No evidence of biliary obstruction or stone. Pancreas: Unremarkable. Spleen: Unremarkable. Adrenals/Urinary Tract: Negative adrenals. No hydronephrosis or stone. Partially duplicated left ureter. Unremarkable bladder. Reproductive:Hysterectomy. The left ovary is enlarged at nearly 6 cm with a 21 mm cyst. The ovary is contiguous with ill-defined mesenteric and peritoneal fluid Stomach/Bowel: Numerous colonic diverticula distally with continued sigmoid mesenteric edema and localized colonic wall thickening. Appearance remains suggestive of  diverticulitis. After convalescence anticipate colonoscopy. No obstruction. Pelvic fluid is similar to previous, measuring up to 6 cm. This fluid at has discontinuous peripheral enhancement and no mature abscess like rind. No appendicitis. Vascular/Lymphatic: No acute vascular abnormality. No mass or adenopathy. Musculoskeletal: No acute abnormalities. IMPRESSION: 1. Active sigmoid diverticulitis. Loculated appearing fluid in the pelvis without mature abscess, volume similar or mildly greater than on prior scan September 2016. 2. Newly enlarged left ovary; recommend Doppler evaluation (likely obtainable transabdominally). Electronically Signed   By: Monte Fantasia M.D.   On: 09/22/2015 22:05   I have personally reviewed and evaluated these images and lab results as part of my medical decision-making.   EKG Interpretation None      MDM  I have reviewed and evaluated the relevant laboratory values.I have reviewed and  evaluated the relevant imaging studies.I personally evaluated and interpreted the relevant EKG.I have reviewed the relevant previous healthcare records.I have reviewed EMS Documentation.I obtained HPI from historian. Patient discussed with supervising physician  ED Course:  Assessment: Pt is a 75yF with hx diverticulitis presents with LLQ pain since yesterday. Made NPO. On exam, pt in NAD. Nontoxic/nonseptic appearing. VSS. Afebrile. Lungs CTA. Heart RRR. Abdomen TTP LLQ. Labs mild Leukocytosis. UA unremarakble. CT scan showed sigmoid diverticulitis. No abscess. NO perforation. Radiology recommended follow up US due to enlarged Left ovary, which showed no evidence of ovarian torsion. Given NS Bolus and analgesia in ED with control of pain. Plan is to DC home with Cipro/Flagyl and analgesia with follow up to PCP if symptoms continue. Given strict return precautions if symptoms worsen. At time of discharge, Patient is in no acute distress. Vital Signs are stable. Patient is able to ambulate. Patient able to tolerate PO.     Disposition/Plan:  DC Home Additional Verbal discharge instructions given and discussed with patient.  Pt Instructed to f/u with PCP in the next week for evaluation and treatment of symptoms. Return precautions given Pt acknowledges and agrees with plan  Supervising Physician Tanna Furry, MD   Final diagnoses:  Diverticulitis of large intestine without perforation or abscess without bleeding   I personally performed the services described in this documentation, which was scribed in my presence. The recorded information has been reviewed and is accurate.    Shary Decamp, PA-C 09/22/15 [id number]  Tanna Furry, MD 10/01/15 [phone]

## 2015-12-24 ENCOUNTER — Emergency Department (HOSPITAL_COMMUNITY): Payer: Self-pay

## 2015-12-24 ENCOUNTER — Emergency Department (HOSPITAL_COMMUNITY)
Admission: EM | Admit: 2015-12-24 | Discharge: 2015-12-24 | Disposition: A | Discharge status: Home / Self Care | Payer: Self-pay | Attending: Emergency Medicine | Admitting: Emergency Medicine

## 2015-12-24 ENCOUNTER — Encounter (HOSPITAL_COMMUNITY): Payer: Self-pay | Admitting: Family Medicine

## 2015-12-24 DIAGNOSIS — J45909 Unspecified asthma, uncomplicated: Secondary | ICD-10-CM | POA: Insufficient documentation

## 2015-12-24 DIAGNOSIS — K5732 Diverticulitis of large intestine without perforation or abscess without bleeding: Secondary | ICD-10-CM | POA: Insufficient documentation

## 2015-12-24 DIAGNOSIS — Z79899 Other long term (current) drug therapy: Secondary | ICD-10-CM | POA: Insufficient documentation

## 2015-12-24 DIAGNOSIS — E669 Obesity, unspecified: Secondary | ICD-10-CM | POA: Insufficient documentation

## 2015-12-24 DIAGNOSIS — Z90721 Acquired absence of ovaries, unilateral: Secondary | ICD-10-CM | POA: Insufficient documentation

## 2015-12-24 DIAGNOSIS — I10 Essential (primary) hypertension: Secondary | ICD-10-CM | POA: Insufficient documentation

## 2015-12-24 DIAGNOSIS — F17210 Nicotine dependence, cigarettes, uncomplicated: Secondary | ICD-10-CM | POA: Insufficient documentation

## 2015-12-24 LAB — LIPASE, BLOOD: Lipase: 15 U/L (ref 11–51)

## 2015-12-24 LAB — URINALYSIS, ROUTINE W REFLEX MICROSCOPIC
BILIRUBIN URINE: NEGATIVE
GLUCOSE, UA: NEGATIVE mg/dL
HGB URINE DIPSTICK: NEGATIVE
KETONES UR: 15 mg/dL — AB
NITRITE: NEGATIVE
PH: 6 (ref 5.0–8.0)
Protein, ur: NEGATIVE mg/dL
SPECIFIC GRAVITY, URINE: 1.025 (ref 1.005–1.030)

## 2015-12-24 LAB — CBC
HEMATOCRIT: 41.5 % (ref 36.0–46.0)
HEMOGLOBIN: 13.5 g/dL (ref 12.0–15.0)
MCH: 28.5 pg (ref 26.0–34.0)
MCHC: 32.5 g/dL (ref 30.0–36.0)
MCV: 87.7 fL (ref 78.0–100.0)
Platelets: 306 10*3/uL (ref 150–400)
RBC: 4.73 MIL/uL (ref 3.87–5.11)
RDW: 13.6 % (ref 11.5–15.5)
WBC: 7.6 10*3/uL (ref 4.0–10.5)

## 2015-12-24 LAB — COMPREHENSIVE METABOLIC PANEL
ALBUMIN: 3.8 g/dL (ref 3.5–5.0)
ALT: 13 U/L — ABNORMAL LOW (ref 14–54)
ANION GAP: 7 (ref 5–15)
AST: 13 U/L — AB (ref 15–41)
Alkaline Phosphatase: 56 U/L (ref 38–126)
BILIRUBIN TOTAL: 0.8 mg/dL (ref 0.3–1.2)
BUN: 8 mg/dL (ref 6–20)
CHLORIDE: 109 mmol/L (ref 101–111)
CO2: 23 mmol/L (ref 22–32)
Calcium: 9.4 mg/dL (ref 8.9–10.3)
Creatinine, Ser: 0.77 mg/dL (ref 0.44–1.00)
GFR calc Af Amer: >60 mL/min (ref >60)
GFR calc non Af Amer: >60 mL/min (ref >60)
GLUCOSE: 97 mg/dL (ref 65–99)
POTASSIUM: 4.1 mmol/L (ref 3.5–5.1)
SODIUM: 139 mmol/L (ref 135–145)
Total Protein: 6.8 g/dL (ref 6.5–8.1)

## 2015-12-24 LAB — URINE MICROSCOPIC-ADD ON
BACTERIA UA: NONE SEEN
RBC / HPF: NONE SEEN RBC/hpf (ref 0–5)

## 2015-12-24 MED ORDER — HYDROMORPHONE HCL 1 MG/ML IJ SOLN
0.5000 mg | Freq: Once | INTRAMUSCULAR | Status: AC
  Administered 2015-12-24: 0.5 mg via INTRAVENOUS
  Filled 2015-12-24: qty 1

## 2015-12-24 MED ORDER — HYDROMORPHONE HCL 1 MG/ML IJ SOLN
0.5000 mg | Freq: Once | INTRAMUSCULAR | Status: AC
Start: 2015-12-24 — End: 2015-12-24
  Administered 2015-12-24: 0.5 mg via INTRAVENOUS
  Filled 2015-12-24: qty 1

## 2015-12-24 MED ORDER — IOPAMIDOL (ISOVUE-300) INJECTION 61%
INTRAVENOUS | Status: AC
  Administered 2015-12-24: 100 mL
  Filled 2015-12-24: qty 100

## 2015-12-24 MED ORDER — METRONIDAZOLE 500 MG PO TABS: 500.0000 mg | ORAL_TABLET | Freq: Three times a day (TID) | ORAL | Status: DC

## 2015-12-24 MED ORDER — OXYCODONE-ACETAMINOPHEN 5-325 MG PO TABS: 1.0000 | ORAL_TABLET | Freq: Three times a day (TID) | ORAL | Status: DC | PRN

## 2015-12-24 MED ORDER — CIPROFLOXACIN HCL 500 MG PO TABS: 500.0000 mg | ORAL_TABLET | Freq: Two times a day (BID) | ORAL | Status: DC

## 2015-12-24 MED ORDER — SODIUM CHLORIDE 0.9 % IV BOLUS (SEPSIS)
500.0000 mL | Freq: Once | INTRAVENOUS | Status: AC
  Administered 2015-12-24: 500 mL via INTRAVENOUS

## 2015-12-24 MED ORDER — POLYETHYLENE GLYCOL 3350 17 G PO PACK: 17.0000 g | PACK | Freq: Every day | ORAL | Status: DC

## 2015-12-24 NOTE — ED Notes (Signed)
Dr. Alvino Chapel notified on pt.'s persistent mid abdominal pain .

## 2015-12-24 NOTE — ED Notes (Signed)
Pt ambulated to restroom from room. 

## 2015-12-24 NOTE — ED Provider Notes (Signed)
CSN: [id number]     Arrival date & time 12/24/15  1247 History   First MD Initiated Contact with Patient 12/24/15 1646     Chief Complaint  Patient presents with  . Abdominal Pain      Patient is a 45 y.o. female presenting with abdominal pain. The history is provided by the patient.  Abdominal Pain Associated symptoms: no chest pain, no fever, no hematuria and no shortness of breath   Patient presents with pain in her left abdomen. Began a few days ago. States began gradually while she was riding the bus. She's had previous history of diverticulitis previous issue with the ovary on that side. Previous hysterectomy and had her right ovary taken out also. No fevers but states she has had some chills. Slightly decreased appetite. No nausea vomiting or diarrhea. No dysuria. The pain is dull and constant. Movement makes the pain worse.  Past Medical History  Diagnosis Date  . Asthma   . Obesity   . Seasonal allergies   . Diverticulitis   . Ovarian cyst   . Infection     trich  . Hypertension   . Pneumonia     had 2 times 1997 and 2012  . Headache     migraine in 2015  . Anemia     on Iron   . History of blood transfusion   . Hematoma 01/2015   Past Surgical History  Procedure Laterality Date  . Tubal ligation    . Cesarean section    . Forehead reconstruction      as a child  . Wisdom tooth extraction    . Abdominal hysterectomy N/A 12/31/2014    Procedure: HYSTERECTOMY ABDOMINAL;  Surgeon: Woodroe Mode, MD;  Location: [facility];  Service: Gynecology;  Laterality: N/A;  . Salpingoophorectomy Right 12/31/2014    Procedure: RIGHT SALPINGO OOPHORECTOMY;  Surgeon: Woodroe Mode, MD;  Location: [facility];  Service: Gynecology;  Laterality: Right;   Family History  Problem Relation Age of Onset  . Diabetes Mother   . Heart disease Mother     chf  . Hypertension Father    Social History  Substance Use Topics  . Smoking status: Current Some Day Smoker -- 0.25 packs/day for 8 years     Types: Cigarettes  . Smokeless tobacco: Never Used     Comment: 3-4 cigs per day  . Alcohol Use: 0.0 oz/week     Comment: occ   OB History    Gravida Para Term Preterm AB TAB SAB Ectopic Multiple Living   5 5 4 1  0 0 0 0 0 5     Review of Systems  Constitutional: Negative for fever and appetite change.  Respiratory: Negative for shortness of breath.   Cardiovascular: Negative for chest pain.  Gastrointestinal: Positive for abdominal pain.  Genitourinary: Negative for hematuria and flank pain.  Musculoskeletal: Negative for back pain.  Skin: Negative for rash.  Neurological: Negative for weakness and numbness.      Allergies  Ibuprofen; Azithromycin; and Other  Home Medications   Prior to Admission medications   Medication Sig Start Date End Date Taking? Authorizing Provider  acetaminophen (TYLENOL) 500 MG tablet Take 1,000 mg by mouth every 6 (six) hours as needed for moderate pain.   Yes Historical Provider, MD  albuterol (PROVENTIL HFA;VENTOLIN HFA) 108 (90 Base) MCG/ACT inhaler Inhale 2 puffs into the lungs every 6 (six) hours as needed for wheezing or shortness of breath. 09/03/15  Yes Enobong  Amao, MD  mometasone-formoterol (DULERA) 200-5 MCG/ACT AERO Inhale 2 puffs into the lungs 2 (two) times daily. 09/03/15  Yes Arnoldo Morale, MD  amLODipine (NORVASC) 10 MG tablet Take 1 tablet (10 mg total) by mouth daily. Patient not taking: Reported on 12/24/2015 09/03/15   Arnoldo Morale, MD  ciprofloxacin (CIPRO) 500 MG tablet Take 1 tablet (500 mg total) by mouth 2 (two) times daily. 12/24/15   Davonna Belling, MD  metroNIDAZOLE (FLAGYL) 500 MG tablet Take 1 tablet (500 mg total) by mouth 3 (three) times daily. 12/24/15   Davonna Belling, MD  oxyCODONE (OXY IR/ROXICODONE) 5 MG immediate release tablet Take 1 tablet (5 mg total) by mouth every 6 (six) hours as needed for moderate pain. 03/24/15   Emina Riebock, NP  oxyCODONE-acetaminophen (PERCOCET/ROXICET) 5-325 MG tablet Take 1  tablet by mouth every 8 (eight) hours as needed for severe pain. 12/24/15   Davonna Belling, MD  polyethylene glycol [facility]) packet Take 17 g by mouth daily. 12/24/15   Davonna Belling, MD   BP 136/78 mmHg  Pulse 70  Temp(Src) 98.6 F (37 C) (Oral)  Resp 14  SpO2 99%  LMP 12/05/2014 Physical Exam  Constitutional: She appears well-developed.  HENT:  Head: Atraumatic.  Eyes: EOM are normal.  Neck: Neck supple.  Cardiovascular: Normal rate.   Pulmonary/Chest: Effort normal.  Abdominal: There is tenderness.  Moderate tenderness in left lower quadrant without rebound or guarding.  Musculoskeletal: Normal range of motion.  Neurological: She is alert.  Skin: Skin is warm.    ED Course  Procedures (including critical care time) Labs Review Labs Reviewed  COMPREHENSIVE METABOLIC PANEL - Abnormal; Notable for the following:    AST 13 (*)    ALT 13 (*)    All other components within normal limits  URINALYSIS, ROUTINE W REFLEX MICROSCOPIC (NOT AT [facility]) - Abnormal; Notable for the following:    Ketones, ur 15 (*)    Leukocytes, UA SMALL (*)    All other components within normal limits  URINE MICROSCOPIC-ADD ON - Abnormal; Notable for the following:    Squamous Epithelial / LPF 0-5 (*)    All other components within normal limits  LIPASE, BLOOD  CBC    Imaging Review Ct Abdomen Pelvis W Contrast  12/24/2015  CLINICAL DATA:  Acute onset of left lower quadrant abdominal pain. Initial encounter. EXAM: CT ABDOMEN AND PELVIS WITH CONTRAST TECHNIQUE: Multidetector CT imaging of the abdomen and pelvis was performed using the standard protocol following bolus administration of intravenous contrast. CONTRAST:  100 mL ISOVUE-300 IOPAMIDOL (ISOVUE-300) INJECTION 61% COMPARISON:  CT of the abdomen and pelvis, and pelvic ultrasound performed 09/22/2015 FINDINGS: Minimal right basilar atelectasis or scarring is noted. The liver and spleen are unremarkable in appearance. The gallbladder is within  normal limits. The pancreas and adrenal glands are unremarkable. The kidneys are unremarkable in appearance. There is no evidence of hydronephrosis. No renal or ureteral stones are seen. No perinephric stranding is appreciated. No free fluid is identified. The small bowel is unremarkable in appearance. The stomach is within normal limits. No acute vascular abnormalities are seen. The appendix is not well seen; there is no evidence for appendicitis. Focal wall thickening is noted at the proximal to mid sigmoid colon, with suggestion of associated inflamed diverticulum and surrounding soft tissue inflammation, concerning for acute diverticulitis. There is no definite evidence for perforation or abscess formation. This is just adjacent to the fluid within the pelvis. The bladder is mildly distended and grossly unremarkable.  The patient is status post hysterectomy. A 3.5 cm left adnexal cystic focus may be physiologic, but is not well assessed on CT. A small amount of free fluid is seen within the pelvis, possibly physiologic, or related to the diverticulitis. No inguinal lymphadenopathy is seen. No acute osseous abnormalities are identified. IMPRESSION: 1. Focal wall thickening at the proximal mid sigmoid colon, with suggestion of associated inflamed diverticulum and surrounding soft tissue inflammation, concerning for acute diverticulitis. No definite evidence for perforation or abscess formation this time. 2. Small amount of free fluid in the pelvis may be physiologic, or may be related to the diverticulitis. 3. 3.5 cm left adnexal cystic focus is not well assessed on CT. This seems relatively stable from March and may be physiologic. Electronically Signed   By: Garald Balding M.D.   On: 12/24/2015 19:26   I have personally reviewed and evaluated these images and lab results as part of my medical decision-making.   EKG Interpretation None      MDM   Final diagnoses:  Diverticulitis of large intestine  without perforation or abscess without bleeding    Patient with abdominal pain. Left lower quadrant pain with diverticulitis on CT. Labs reassuring. Patient does not want admission and will be discharged home.    Davonna Belling, MD 12/24/15 2027

## 2015-12-24 NOTE — ED Notes (Signed)
Pt here for LLQ pain without N,V,D since yesterday. sts she just finished a long bus trip yesterday. sts severe with palpation.

## 2015-12-24 NOTE — ED Notes (Signed)
Pt ambulated to room from waiting room. 

## 2015-12-24 NOTE — Discharge Instructions (Signed)
Diverticulitis °Diverticulitis is inflammation or infection of small pouches in your colon that form when you have a condition called diverticulosis. The pouches in your colon are called diverticula. Your colon, or large intestine, is where water is absorbed and stool is formed. °Complications of diverticulitis can include: °· Bleeding. °· Severe infection. °· Severe pain. °· Perforation of your colon. °· Obstruction of your colon. °CAUSES  °Diverticulitis is caused by bacteria. °Diverticulitis happens when stool becomes trapped in diverticula. This allows bacteria to grow in the diverticula, which can lead to inflammation and infection. °RISK FACTORS °People with diverticulosis are at risk for diverticulitis. Eating a diet that does not include enough fiber from fruits and vegetables may make diverticulitis more likely to develop. °SYMPTOMS  °Symptoms of diverticulitis may include: °· Abdominal pain and tenderness. The pain is normally located on the left side of the abdomen, but may occur in other areas. °· Fever and chills. °· Bloating. °· Cramping. °· Nausea. °· Vomiting. °· Constipation. °· Diarrhea. °· Blood in your stool. °DIAGNOSIS  °Your health care provider will ask you about your medical history and do a physical exam. You may need to have tests done because many medical conditions can cause the same symptoms as diverticulitis. Tests may include: °· Blood tests. °· Urine tests. °· Imaging tests of the abdomen, including X-rays and CT scans. °When your condition is under control, your health care provider may recommend that you have a colonoscopy. A colonoscopy can show how severe your diverticula are and whether something else is causing your symptoms. °TREATMENT  °Most cases of diverticulitis are mild and can be treated at home. Treatment may include: °· Taking over-the-counter pain medicines. °· Following a clear liquid diet. °· Taking antibiotic medicines by mouth for 7-10 days. °More severe cases may  be treated at a hospital. Treatment may include: °· Not eating or drinking. °· Taking prescription pain medicine. °· Receiving antibiotic medicines through an IV tube. °· Receiving fluids and nutrition through an IV tube. °· Surgery. °HOME CARE INSTRUCTIONS  °· Follow your health care provider's instructions carefully. °· Follow a full liquid diet or other diet as directed by your health care provider. After your symptoms improve, your health care provider may tell you to change your diet. He or she may recommend you eat a high-fiber diet. Fruits and vegetables are good sources of fiber. Fiber makes it easier to pass stool. °· Take fiber supplements or probiotics as directed by your health care provider. °· Only take medicines as directed by your health care provider. °· Keep all your follow-up appointments. °SEEK MEDICAL CARE IF:  °· Your pain does not improve. °· You have a hard time eating food. °· Your bowel movements do not return to normal. °SEEK IMMEDIATE MEDICAL CARE IF:  °· Your pain becomes worse. °· Your symptoms do not get better. °· Your symptoms suddenly get worse. °· You have a fever. °· You have repeated vomiting. °· You have bloody or black, tarry stools. °MAKE SURE YOU:  °· Understand these instructions. °· Will watch your condition. °· Will get help right away if you are not doing well or get worse. °  °This information is not intended to replace advice given to you by your health care provider. Make sure you discuss any questions you have with your health care provider. °  °Document Released: 04/07/2005 Document Revised: 07/03/2013 Document Reviewed: 05/23/2013 °Elsevier Interactive Patient Education ©2016 [employer] ° °

## 2015-12-31 ENCOUNTER — Other Ambulatory Visit: Payer: Self-pay | Admitting: *Deleted

## 2015-12-31 DIAGNOSIS — J4520 Mild intermittent asthma, uncomplicated: Secondary | ICD-10-CM

## 2015-12-31 MED ORDER — MOMETASONE FURO-FORMOTEROL FUM 200-5 MCG/ACT IN AERO: 2.0000 | INHALATION_SPRAY | Freq: Two times a day (BID) | RESPIRATORY_TRACT | Status: DC

## 2015-12-31 NOTE — Telephone Encounter (Signed)
PASS PROGRAM 

## 2016-09-21 ENCOUNTER — Telehealth: Payer: Self-pay

## 2016-09-21 NOTE — Telephone Encounter (Signed)
09/21/2016  Called Ms. Mel Almond  to schedule an appointment with Theda Sers or myself for a pharmacy-led inhaler education initiative at [facility]. I was unable to speak with her or leave a message as no voicemail was set up.   Belia Heman, PharmD PGY1 Pharmacy Resident [phone] (Pager)

## 2016-11-04 IMAGING — CR DG CHEST 2V
2 series · 2 of 2 positions shown · non-contrast
Comparison: Chest radiograph performed 11/23/2013

CLINICAL DATA: Acute onset of cough. Chest pain and shortness of
breath. Initial encounter.

EXAM:
CHEST  2 VIEW

[chest pa]
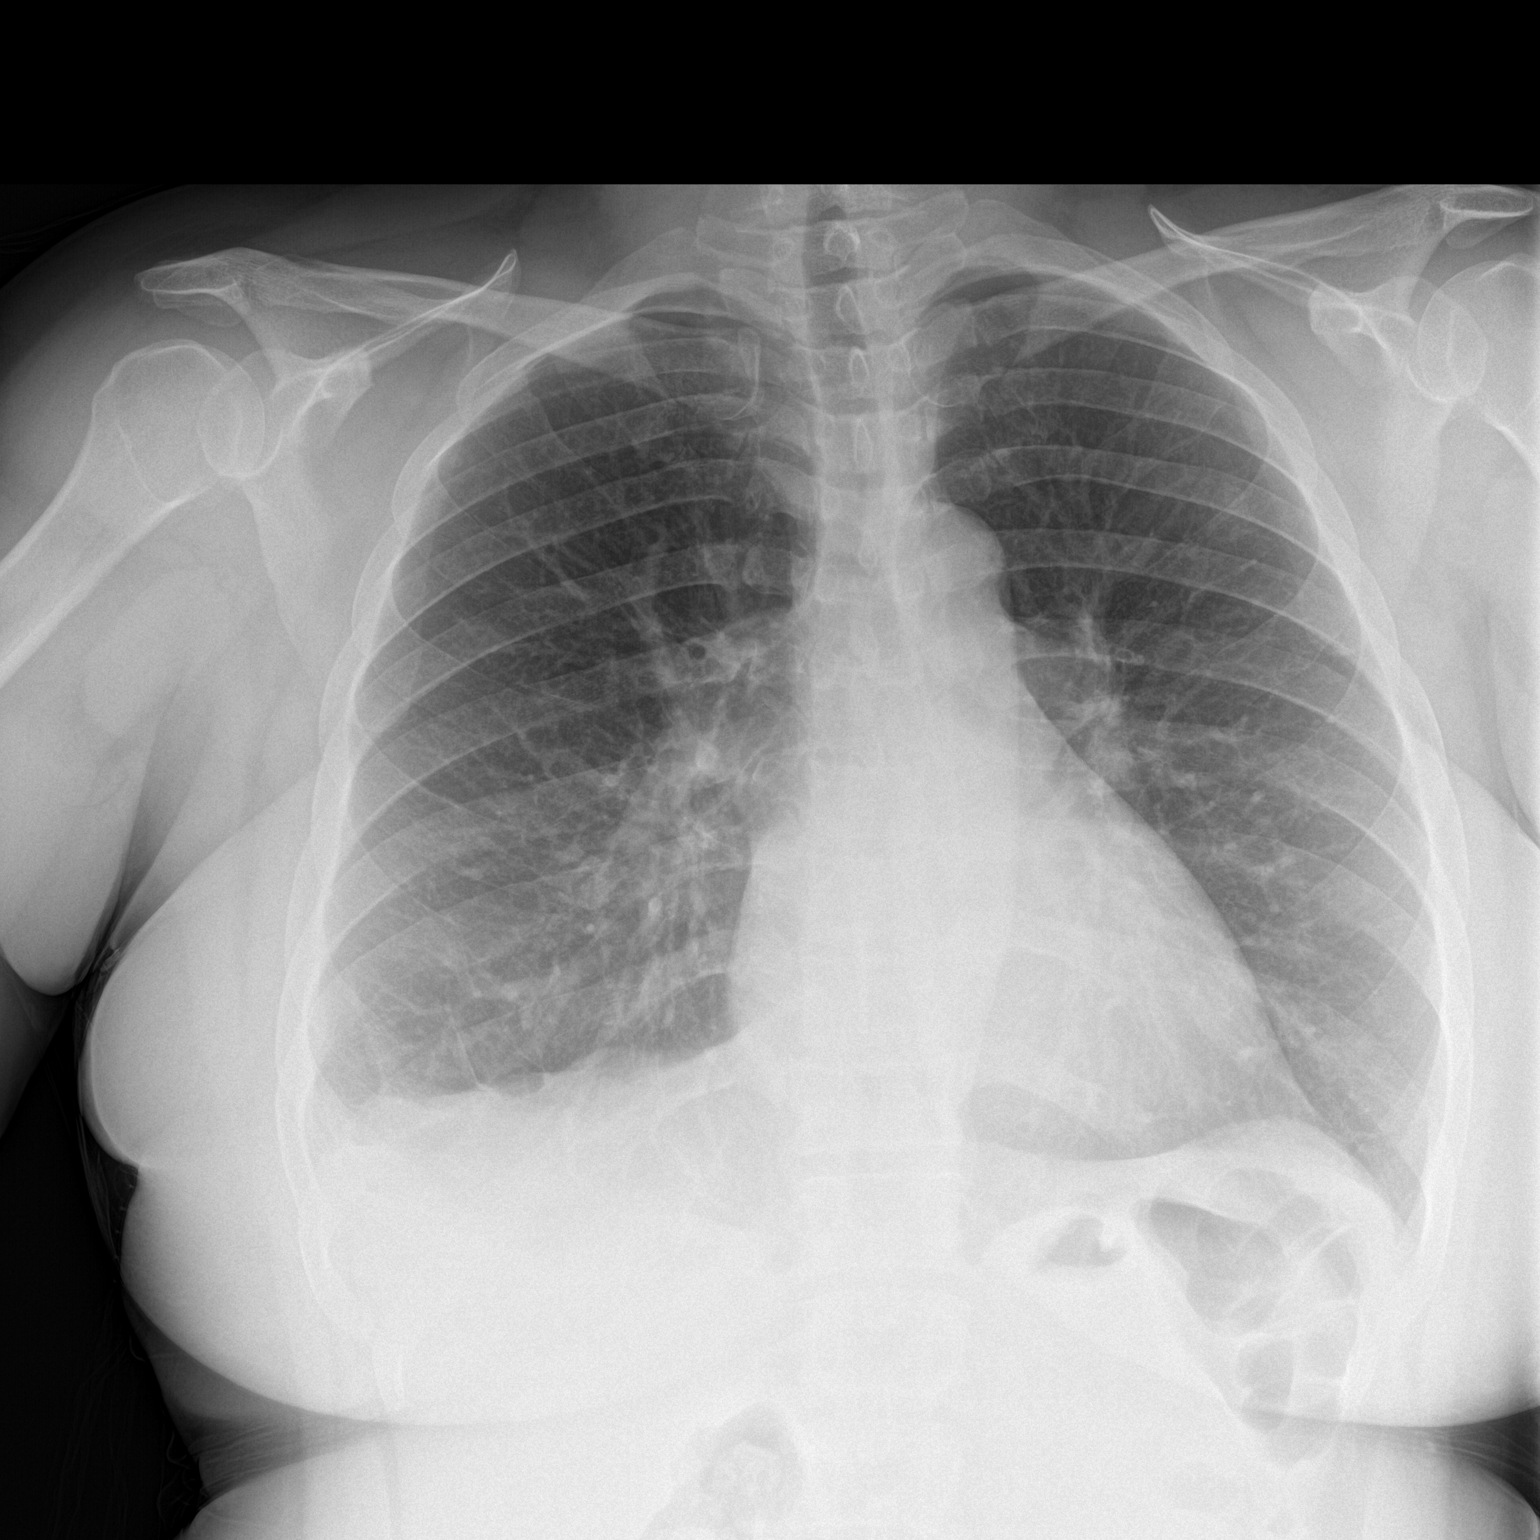

[chest lat]
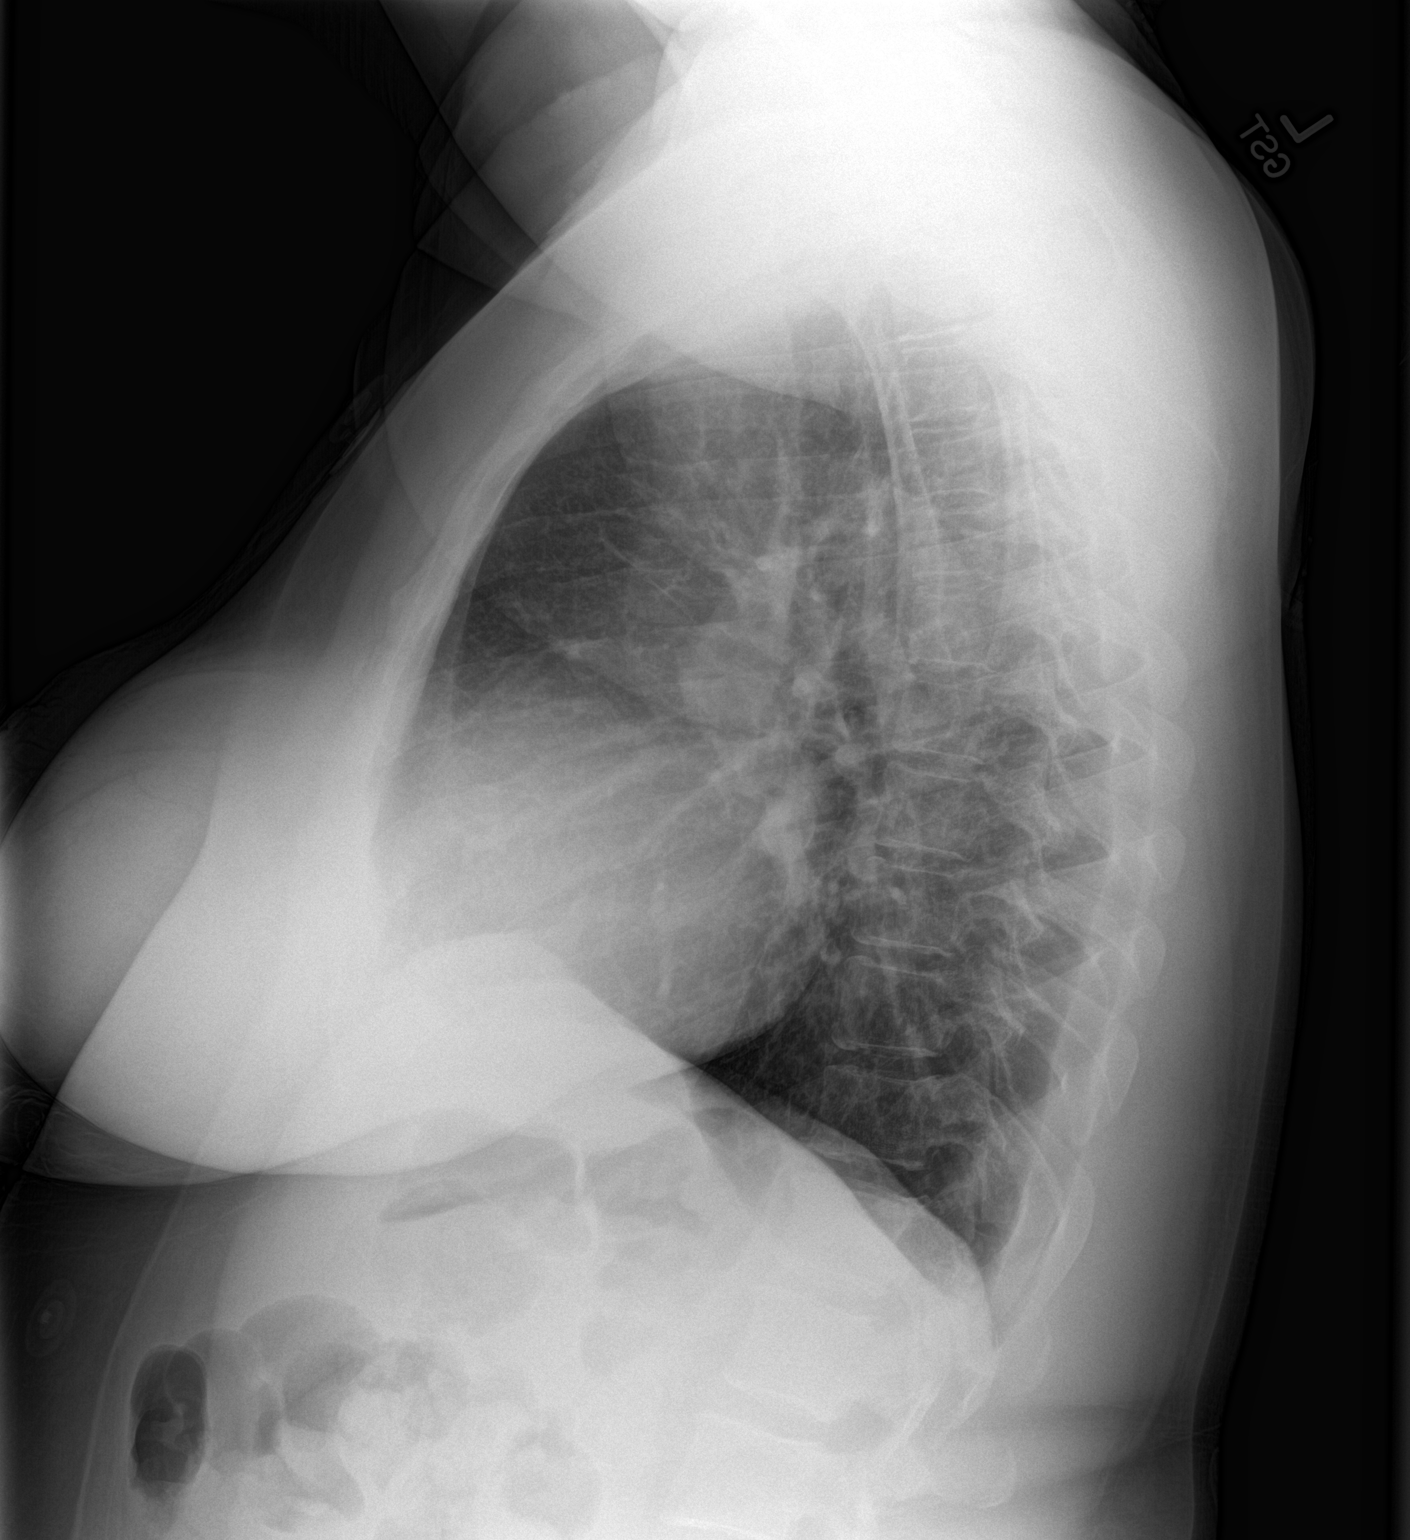

[2 of 2 positions shown; findings below may reference images not displayed]

FINDINGS: The lungs are well-aerated. Mild right basilar atelectasis or
scarring is noted. There is no evidence of pleural effusion or
pneumothorax.

The heart is normal in size; the mediastinal contour is within
normal limits. No acute osseous abnormalities are seen.
IMPRESSION: Mild right basilar atelectasis or scarring noted; lungs otherwise
clear.

## 2017-03-08 IMAGING — CT CT ABD-PELV W/ CM
1 of 3 series · 14 of 32 positions shown, 19 images · IV contrast (OMNIPAQUE)
Comparison: 04/08/2012

CLINICAL DATA: Lower pelvic pain.  Hysterectomy 1 week ago.

EXAM:
CT ABDOMEN AND PELVIS WITH CONTRAST
TECHNIQUE: Multidetector CT imaging of the abdomen and pelvis was performed
using the standard protocol following bolus administration of
intravenous contrast.
CONTRAST:  100mL OMNIPAQUE IOHEXOL 300 MG/ML  SOLN

[Series 2: routine abdomen/pelvis with · axial · 0.84mm/px · z∈[+483,+873]mm · 14 of 88 slices shown, 19 images]
[im 5/88  soft-tissue]
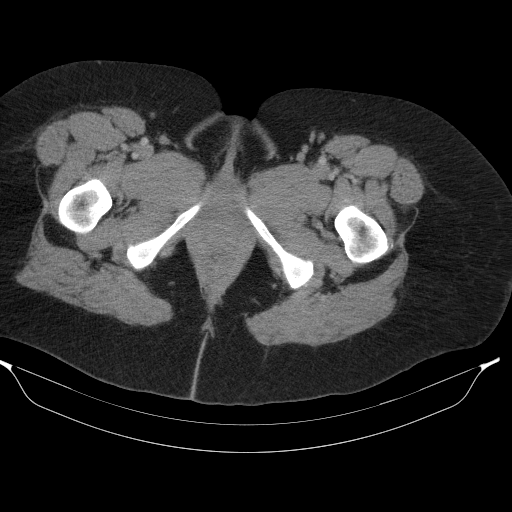
[im 5/88  bone]
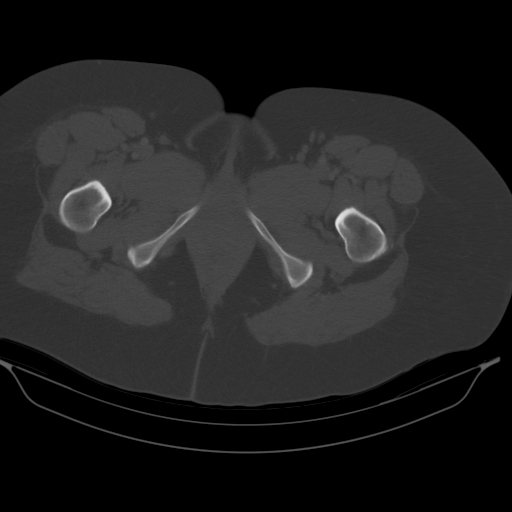
[im 10/88  soft-tissue]
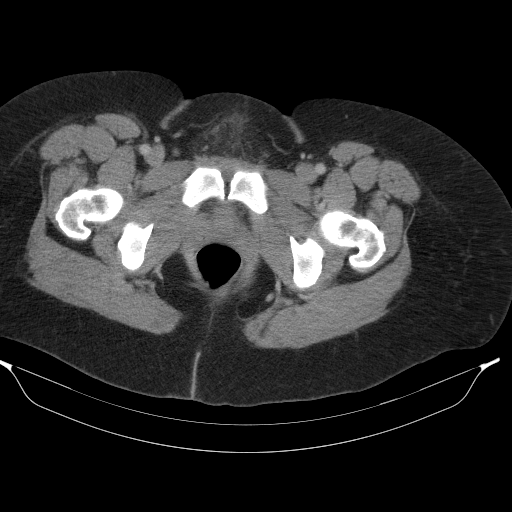
[im 20/88  soft-tissue]
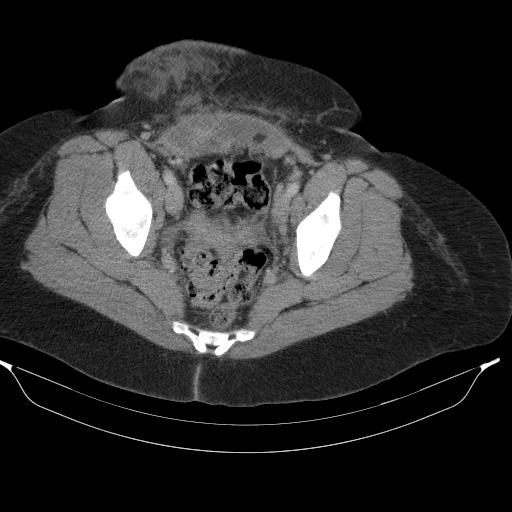
[im 25/88  soft-tissue]
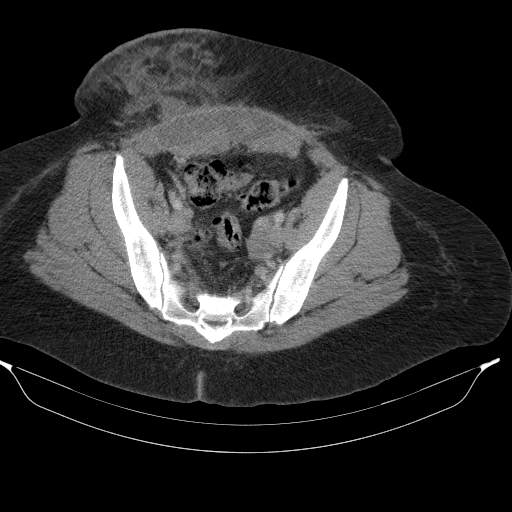
[im 30/88  soft-tissue]
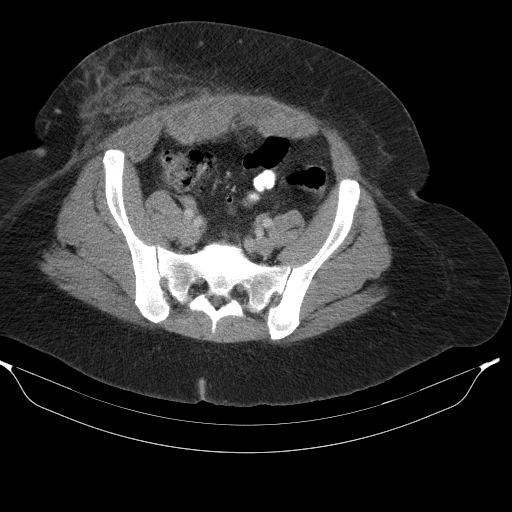
[im 39/88  soft-tissue]
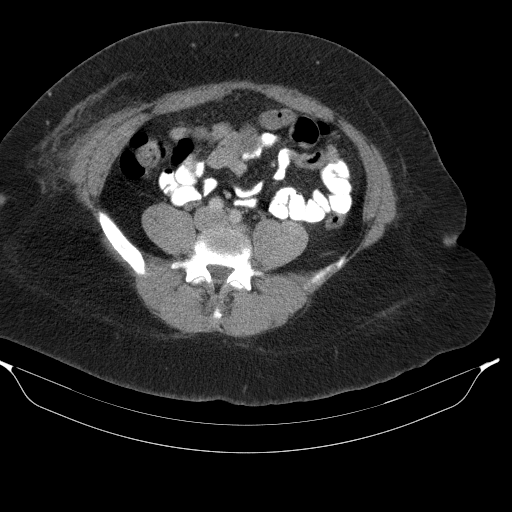
[im 44/88  soft-tissue]
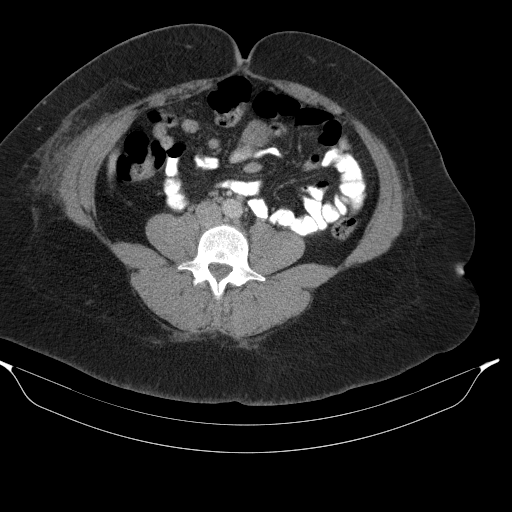
[im 49/88  soft-tissue]
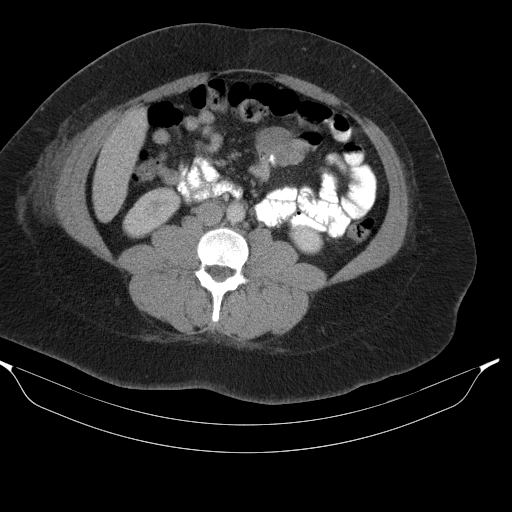
[im 59/88  soft-tissue]
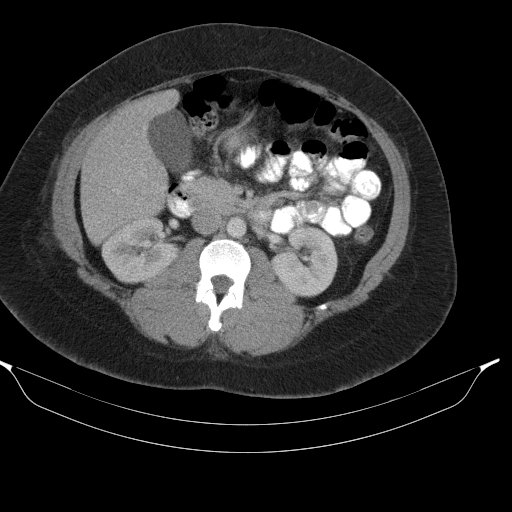
[im 59/88  bone]
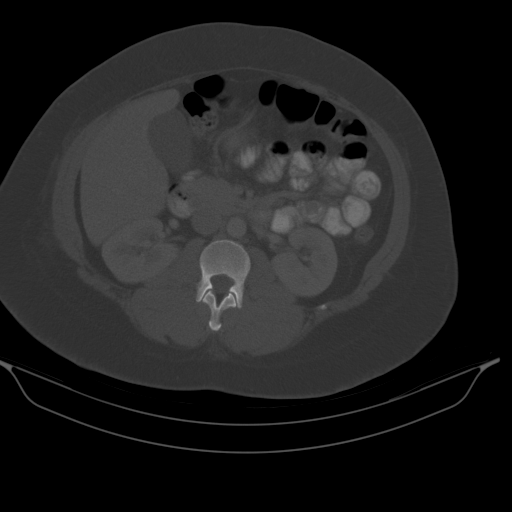
[im 63/88  soft-tissue]
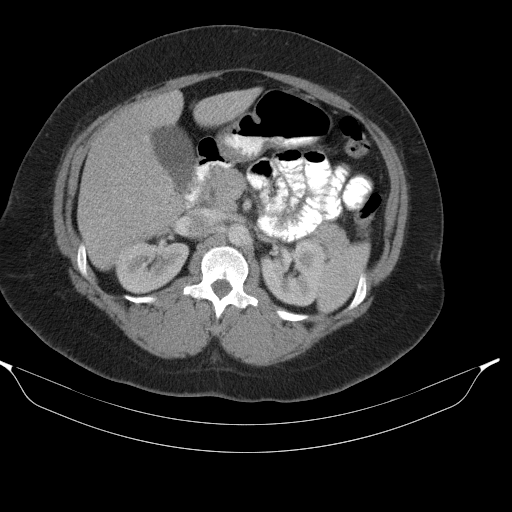
[im 68/88  soft-tissue]
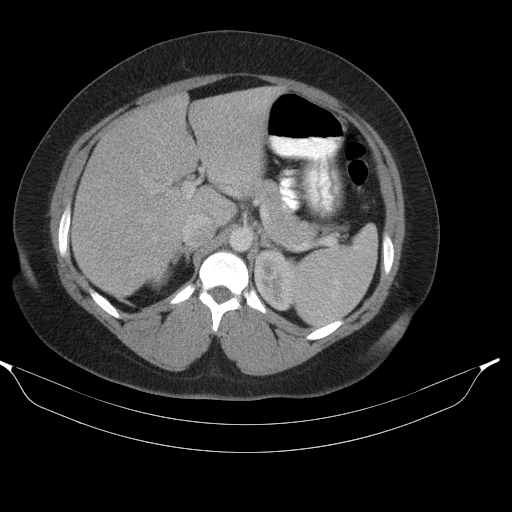
[im 68/88  lung]
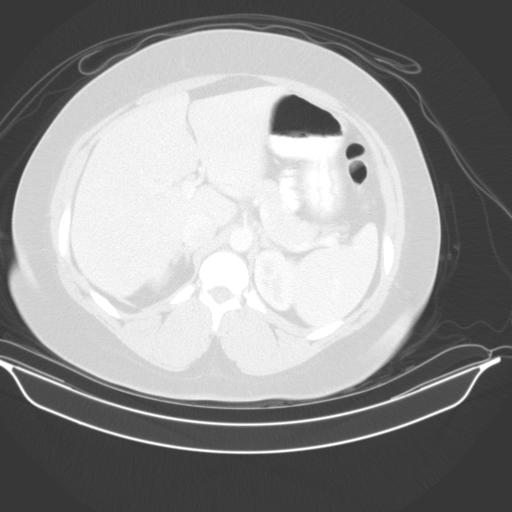
[im 73/88  lung]
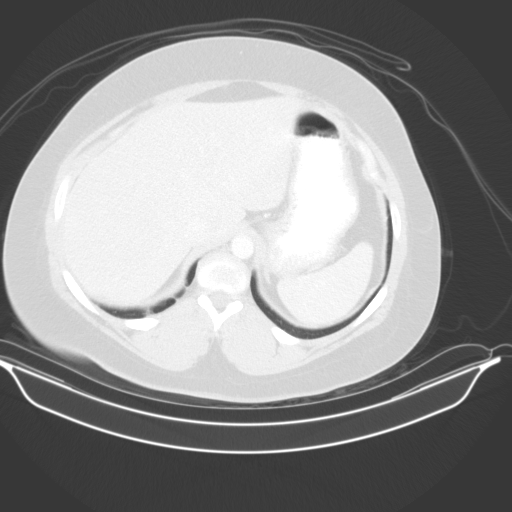
[im 78/88  soft-tissue]
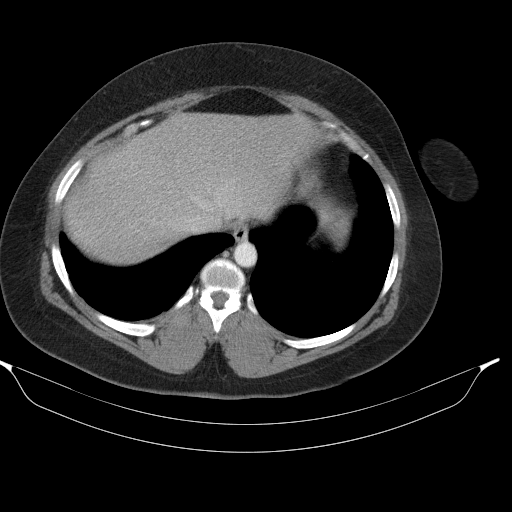
[im 78/88  lung]
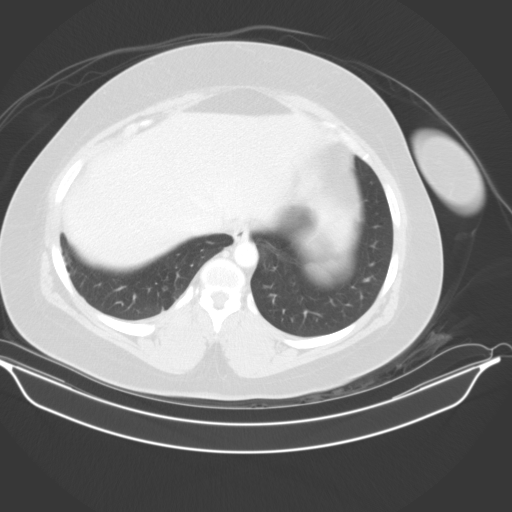
[im 83/88  soft-tissue]
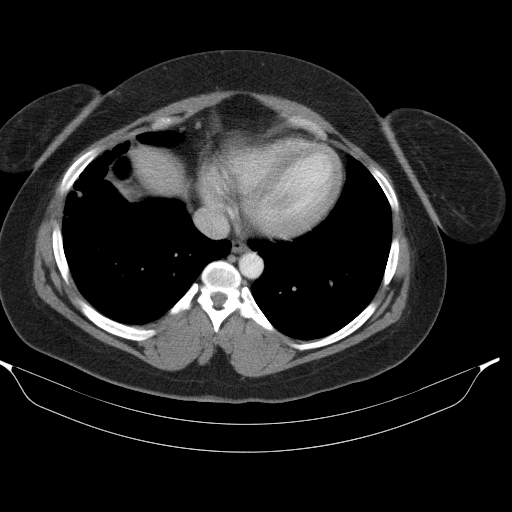
[im 83/88  lung]
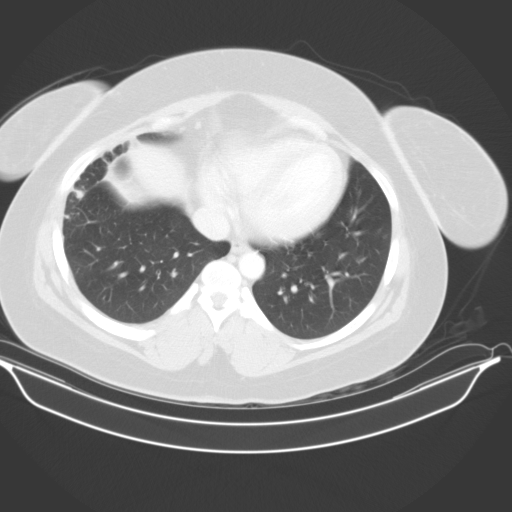

[14 of 32 positions shown; findings below may reference images not displayed]

FINDINGS: There is a hematoma of the rectus abdominus musculature in the low
abdomen. The hematoma measures 12.2 cm transverse by 3.4 cm depth by
10.9 cm craniocaudal. Mild stranding in the subcutaneous fat is
present in the low abdomen to the right of midline. No hematoma or
drainable collection is evident deep to the abdominal wall
musculature. There are unremarkable appearances of the hysterectomy
bed.

There are normal appearances of the liver, spleen, pancreas,
adrenals, kidneys, gallbladder and bile ducts. Bowel appears
unremarkable except for mild colonic diverticulosis.

In the lower chest, there is stable linear and nodular scarring of
the lung bases, right greater than left, unchanged from 04/08/2012.

There is no significant skeletal abnormality.
IMPRESSION: 1. Hematoma of the low rectus abdominus muscle extending across the
midline with dimensions of 10.9 x 12.2 cm and depth of 3.4 cm.
2. No hematoma or drainable collection deep to the abdominal wall
musculature.
3. Unremarkable appearances of the hysterectomy bed.
4. Mild colonic diverticulosis.

## 2022-01-15 ENCOUNTER — Encounter (HOSPITAL_COMMUNITY): Payer: Self-pay | Admitting: Emergency Medicine

## 2022-01-15 ENCOUNTER — Ambulatory Visit (HOSPITAL_COMMUNITY)
Admission: EM | Admit: 2022-01-15 | Discharge: 2022-01-15 | Disposition: A | Discharge status: Home / Self Care | Payer: Self-pay | Attending: Family Medicine | Admitting: Family Medicine

## 2022-01-15 DIAGNOSIS — J4541 Moderate persistent asthma with (acute) exacerbation: Secondary | ICD-10-CM

## 2022-01-15 DIAGNOSIS — J4520 Mild intermittent asthma, uncomplicated: Secondary | ICD-10-CM

## 2022-01-15 DIAGNOSIS — R0602 Shortness of breath: Secondary | ICD-10-CM

## 2022-01-15 DIAGNOSIS — I10 Essential (primary) hypertension: Secondary | ICD-10-CM

## 2022-01-15 DIAGNOSIS — J069 Acute upper respiratory infection, unspecified: Secondary | ICD-10-CM

## 2022-01-15 MED ORDER — ALBUTEROL SULFATE (2.5 MG/3ML) 0.083% IN NEBU
2.5000 mg | INHALATION_SOLUTION | RESPIRATORY_TRACT | Status: DC
  Administered 2022-01-15: 2.5 mg via RESPIRATORY_TRACT

## 2022-01-15 MED ORDER — DM-GUAIFENESIN ER 30-600 MG PO TB12: 1.0000 | ORAL_TABLET | Freq: Two times a day (BID) | ORAL | 1 refills | Status: DC

## 2022-01-15 MED ORDER — MOMETASONE FURO-FORMOTEROL FUM 200-5 MCG/ACT IN AERO: 2.0000 | INHALATION_SPRAY | Freq: Two times a day (BID) | RESPIRATORY_TRACT | 2 refills | Status: DC

## 2022-01-15 MED ORDER — METHYLPREDNISOLONE SODIUM SUCC 125 MG IJ SOLR
80.0000 mg | Freq: Once | INTRAMUSCULAR | Status: AC
  Administered 2022-01-15: 80 mg via INTRAMUSCULAR

## 2022-01-15 MED ORDER — AMLODIPINE BESYLATE 10 MG PO TABS: 10.0000 mg | ORAL_TABLET | Freq: Every day | ORAL | 3 refills | Status: DC

## 2022-01-15 MED ORDER — ALBUTEROL SULFATE (2.5 MG/3ML) 0.083% IN NEBU: 2.5000 mg | INHALATION_SOLUTION | Freq: Four times a day (QID) | RESPIRATORY_TRACT | 12 refills | Status: DC | PRN

## 2022-01-15 MED ORDER — ALBUTEROL SULFATE HFA 108 (90 BASE) MCG/ACT IN AERS: 2.0000 | INHALATION_SPRAY | Freq: Four times a day (QID) | RESPIRATORY_TRACT | 3 refills | Status: DC | PRN

## 2022-01-15 NOTE — ED Provider Notes (Signed)
[facility]    CSN: [id number] Arrival date & time: 01/15/22  1456      History   Chief Complaint Chief Complaint  Patient presents with   Asthma   Shortness of Breath    HPI Anne Schaefer is a 51 y.o. female.   51 year old female presents with wheezing and shortness of breath.  Patient relates that she has a history of having asthma and has been using her albuterol inhaler on a regular basis to help her breathe since she has ran out of her medications.  Patient indicates she was on a steroid inhaler but she does not have any of it anymore and she does not have her blood pressure medicine as well.  Patient indicates that her husband passed just recently and she [place].  Patient indicates for the past several days she has been having increased shortness of breath, difficulty breathing, wheezing, and fatigue.  Patient relates she has had intermittent cough and chest congestion with thick production, no fever or chills.  Patient is tolerating fluids well Patient indicates she did have a nebulizer unit however she lost it in transit when moving.  Patient request to have access to a nebulizer unit if possible.  Patient indicates that she has limited financial resources at the present time, as she is trying to settle her affairs in the affairs of her deceased spouse. Patient indicates she is staying with relatives and that at night they do not run the air conditioning and this tends to aggravate her asthma.   Asthma Associated symptoms include shortness of breath.  Shortness of Breath   Past Medical History:  Diagnosis Date   Anemia    on Iron    Asthma    Diverticulitis    Headache    migraine in 2015   Hematoma 01/2015   History of blood transfusion    Hypertension    Infection    trich   Obesity    Ovarian cyst    Pneumonia    had 2 times 1997 and 2012   Seasonal allergies     Patient Active Problem List   Diagnosis Date Noted    Asthma 09/03/2015   Right fibular fracture 09/03/2015   Lung nodule 09/03/2015   Diverticulitis of large intestine with abscess without bleeding 03/20/2015   Rectus sheath hematoma s/p hysterectomy 12/31/14 01/12/2015   S/P abdominal supracervical hysterectomy and right salpingo-oophorectomy on 12/31/14 01/12/2015   Menorrhagia 09/24/2014   Smoking 03/08/2014   Essential hypertension, benign 03/08/2014   Unspecified asthma(493.90) 07/20/2013   Dermoid cyst of ovary 05/21/2013   PID (acute pelvic inflammatory disease) 04/09/2013   Hidradenitis suppurativa 04/09/2012    Past Surgical History:  Procedure Laterality Date   ABDOMINAL HYSTERECTOMY N/A 12/31/2014   Procedure: HYSTERECTOMY ABDOMINAL;  Surgeon: Woodroe Mode, MD;  Location: [facility];  Service: Gynecology;  Laterality: N/A;   CESAREAN SECTION     FOREHEAD RECONSTRUCTION     as a child   SALPINGOOPHORECTOMY Right 12/31/2014   Procedure: RIGHT SALPINGO OOPHORECTOMY;  Surgeon: Woodroe Mode, MD;  Location: [facility];  Service: Gynecology;  Laterality: Right;   TUBAL LIGATION     WISDOM TOOTH EXTRACTION      OB History     Gravida  5   Para  5   Term  4   Preterm  1   AB  0   Living  5  SAB  0   IAB  0   Ectopic  0   Multiple  0   Live Births  1            Home Medications    Prior to Admission medications   Medication Sig Start Date End Date Taking? Authorizing Provider  albuterol (PROVENTIL) (2.5 MG/3ML) 0.083% nebulizer solution Take 3 mLs (2.5 mg total) by nebulization every 6 (six) hours as needed for wheezing or shortness of breath. 01/15/22  Yes Nyoka Lint, PA-C  dextromethorphan-guaiFENesin [facility] DM) 30-600 MG 12hr tablet Take 1 tablet by mouth 2 (two) times daily. 01/15/22  Yes Nyoka Lint, PA-C  acetaminophen (TYLENOL) 500 MG tablet Take 1,000 mg by mouth every 6 (six) hours as needed for moderate pain.    [provider]  albuterol (VENTOLIN HFA) 108 (90 Base) MCG/ACT inhaler  Inhale 2 puffs into the lungs every 6 (six) hours as needed for wheezing or shortness of breath. 01/15/22   Nyoka Lint, PA-C  amLODipine (NORVASC) 10 MG tablet Take 1 tablet (10 mg total) by mouth daily. 01/15/22   Nyoka Lint, PA-C  ciprofloxacin (CIPRO) 500 MG tablet Take 1 tablet (500 mg total) by mouth 2 (two) times daily. 12/24/15   Davonna Belling, MD  metroNIDAZOLE (FLAGYL) 500 MG tablet Take 1 tablet (500 mg total) by mouth 3 (three) times daily. 12/24/15   Davonna Belling, MD  mometasone-formoterol [facility]) 200-5 MCG/ACT AERO Inhale 2 puffs into the lungs 2 (two) times daily. 01/15/22   Nyoka Lint, PA-C  oxyCODONE (OXY IR/ROXICODONE) 5 MG immediate release tablet Take 1 tablet (5 mg total) by mouth every 6 (six) hours as needed for moderate pain. 03/24/15   Riebock, Estill Bakes, NP  oxyCODONE-acetaminophen (PERCOCET/ROXICET) 5-325 MG tablet Take 1 tablet by mouth every 8 (eight) hours as needed for severe pain. 12/24/15   Davonna Belling, MD  polyethylene glycol [facility]) packet Take 17 g by mouth daily. 12/24/15   Davonna Belling, MD    Family History Family History  Problem Relation Age of Onset   Diabetes Mother    Heart disease Mother        chf   Hypertension Father     Social History Social History   Tobacco Use   Smoking status: Some Days    Packs/day: 0.25    Years: 8.00    Total pack years: 2.00    Types: Cigarettes   Smokeless tobacco: Never   Tobacco comments:    3-4 cigs per day  Substance Use Topics   Alcohol use: Yes    Comment: occ   Drug use: Yes    Types: Marijuana    Comment: occasionally     Allergies   Ibuprofen, Azithromycin, and Other   Review of Systems Review of Systems  Respiratory:  Positive for shortness of breath.      Physical Exam Triage Vital Signs ED Triage Vitals  Enc Vitals Group     BP 01/15/22 1555 (!) 208/108     Pulse Rate 01/15/22 1555 86     Resp 01/15/22 1555 20     Temp 01/15/22 1555 97.9 F (36.6 C)     Temp  Source 01/15/22 1555 Oral     SpO2 01/15/22 1555 99 %     Weight --      Height --      Head Circumference --      Peak Flow --      Pain Score 01/15/22 1553 8  Pain Loc --      Pain Edu? --      Excl. in [place]? --    No data found.  Updated Vital Signs BP (!) 208/108 (BP Location: Left Wrist) Comment: states been out of BP meds since June.  Pulse 86   Temp 97.9 F (36.6 C) (Oral)   Resp 20   LMP 12/05/2014   SpO2 99%   Visual Acuity Right Eye Distance:   Left Eye Distance:   Bilateral Distance:    Right Eye Near:   Left Eye Near:    Bilateral Near:     Physical Exam   UC Treatments / Results  Labs (all labs ordered are listed, but only abnormal results are displayed) Labs Reviewed - No data to display  EKG   Radiology No results found.  Procedures Procedures (including critical care time)  Medications Ordered in UC Medications  albuterol (PROVENTIL) (2.5 MG/3ML) 0.083% nebulizer solution 2.5 mg (2.5 mg Nebulization Given 01/15/22 1653)  methylPREDNISolone sodium succinate (SOLU-MEDROL) 125 mg/2 mL injection 80 mg (80 mg Intramuscular Given 01/15/22 1654)    Initial Impression / Assessment and Plan / UC Course  I have reviewed the triage vital signs and the nursing notes.  Pertinent labs & imaging results that were available during my care of the patient were reviewed by me and considered in my medical decision making (see chart for details).    Plan: 1.  Patient has been advised to restart her Dulera inhaler and her amlodipine blood pressure medicine.  These prescriptions have been sent to the pharmacy. 2.  Patient has been provided a nebulizer unit and to use as needed, and the albuterol 0.083% nebulizer solution medication has been sent to the pharmacy. 3.  Patient advised to take the Mucinex DM 1 every 12 hours as needed for congestion and cough. 4.  Patient advised to follow-up with PCP or return to urgent care if symptoms fail to improve. Final  Clinical Impressions(s) / UC Diagnoses   Final diagnoses:  Moderate persistent asthma with exacerbation  Viral upper respiratory tract infection  Shortness of breath     Discharge Instructions      Advised to restart the [facility] which is 2 puffs twice daily to help treat the asthma. Advised to restart the Norvasc 10 mg 1 tablet daily to help control blood pressure. Advised to use the albuterol rescue inhaler as needed. Advised to follow-up with PCP or return to urgent care if symptoms fail to improve.    ED Prescriptions     Medication Sig Dispense Auth. Provider   mometasone-formoterol (DULERA) 200-5 MCG/ACT AERO  (Status: Discontinued) Inhale 2 puffs into the lungs 2 (two) times daily. 13 each Nyoka Lint, PA-C   amLODipine (NORVASC) 10 MG tablet Take 1 tablet (10 mg total) by mouth daily. 30 tablet Nyoka Lint, PA-C   albuterol (VENTOLIN HFA) 108 (90 Base) MCG/ACT inhaler Inhale 2 puffs into the lungs every 6 (six) hours as needed for wheezing or shortness of breath. 1 each Nyoka Lint, PA-C   albuterol (PROVENTIL) (2.5 MG/3ML) 0.083% nebulizer solution Take 3 mLs (2.5 mg total) by nebulization every 6 (six) hours as needed for wheezing or shortness of breath. 75 mL Nyoka Lint, PA-C   dextromethorphan-guaiFENesin [facility] DM) 30-600 MG 12hr tablet Take 1 tablet by mouth 2 (two) times daily. 30 tablet Nyoka Lint, PA-C   mometasone-formoterol (DULERA) 200-5 MCG/ACT AERO Inhale 2 puffs into the lungs 2 (two) times daily. 32 each Nyoka Lint, PA-C  PDMP not reviewed this encounter.   Nyoka Lint, PA-C 01/15/22 1714

## 2022-01-15 NOTE — Discharge Instructions (Addendum)
Advised to restart the [facility] which is 2 puffs twice daily to help treat the asthma. Advised to restart the Norvasc 10 mg 1 tablet daily to help control blood pressure. Advised to use the albuterol rescue inhaler as needed. Advised to follow-up with PCP or return to urgent care if symptoms fail to improve.

## 2022-01-15 NOTE — ED Triage Notes (Signed)
Pt reports shortness of breath related to her asthma since last night. States she ran out of her steroid inhaler. Last use of albuterol inhaler was 1 hr ago with some relief.

## 2022-02-10 ENCOUNTER — Emergency Department (HOSPITAL_COMMUNITY)
Admission: EM | Admit: 2022-02-10 | Discharge: 2022-02-10 | Disposition: A | Discharge status: Home / Self Care | Payer: Self-pay | Attending: Emergency Medicine | Admitting: Emergency Medicine

## 2022-02-10 ENCOUNTER — Encounter (HOSPITAL_COMMUNITY): Payer: Self-pay

## 2022-02-10 ENCOUNTER — Emergency Department (HOSPITAL_COMMUNITY): Payer: Self-pay

## 2022-02-10 ENCOUNTER — Other Ambulatory Visit: Payer: Self-pay

## 2022-02-10 DIAGNOSIS — Z7952 Long term (current) use of systemic steroids: Secondary | ICD-10-CM | POA: Insufficient documentation

## 2022-02-10 DIAGNOSIS — Z7951 Long term (current) use of inhaled steroids: Secondary | ICD-10-CM | POA: Insufficient documentation

## 2022-02-10 DIAGNOSIS — J4541 Moderate persistent asthma with (acute) exacerbation: Secondary | ICD-10-CM | POA: Insufficient documentation

## 2022-02-10 DIAGNOSIS — Z20822 Contact with and (suspected) exposure to covid-19: Secondary | ICD-10-CM | POA: Insufficient documentation

## 2022-02-10 LAB — MAGNESIUM: Magnesium: 2 mg/dL (ref 1.7–2.4)

## 2022-02-10 LAB — COMPREHENSIVE METABOLIC PANEL
ALT: 19 U/L (ref 0–44)
AST: 16 U/L (ref 15–41)
Albumin: 4.1 g/dL (ref 3.5–5.0)
Alkaline Phosphatase: 52 U/L (ref 38–126)
Anion gap: 9 (ref 5–15)
BUN: 9 mg/dL (ref 6–20)
CO2: 28 mmol/L (ref 22–32)
Calcium: 9.6 mg/dL (ref 8.9–10.3)
Chloride: 101 mmol/L (ref 98–111)
Creatinine, Ser: 0.82 mg/dL (ref 0.44–1.00)
GFR, Estimated: >60 mL/min (ref >60)
Glucose, Bld: 92 mg/dL (ref 70–99)
Potassium: 3.6 mmol/L (ref 3.5–5.1)
Sodium: 138 mmol/L (ref 135–145)
Total Bilirubin: 0.4 mg/dL (ref 0.3–1.2)
Total Protein: 7.9 g/dL (ref 6.5–8.1)

## 2022-02-10 LAB — CBC WITH DIFFERENTIAL/PLATELET
Abs Immature Granulocytes: 0.03 10*3/uL (ref 0.00–0.07)
Basophils Absolute: 0.1 10*3/uL (ref 0.0–0.1)
Basophils Relative: 1 %
Eosinophils Absolute: 0.6 10*3/uL — ABNORMAL HIGH (ref 0.0–0.5)
Eosinophils Relative: 6 %
HCT: 42.9 % (ref 36.0–46.0)
Hemoglobin: 14.3 g/dL (ref 12.0–15.0)
Immature Granulocytes: 0 %
Lymphocytes Relative: 32 %
Lymphs Abs: 2.9 10*3/uL (ref 0.7–4.0)
MCH: 29.2 pg (ref 26.0–34.0)
MCHC: 33.3 g/dL (ref 30.0–36.0)
MCV: 87.6 fL (ref 80.0–100.0)
Monocytes Absolute: 0.6 10*3/uL (ref 0.1–1.0)
Monocytes Relative: 6 %
Neutro Abs: 5.1 10*3/uL (ref 1.7–7.7)
Neutrophils Relative %: 55 %
Platelets: 338 10*3/uL (ref 150–400)
RBC: 4.9 MIL/uL (ref 3.87–5.11)
RDW: 13.8 % (ref 11.5–15.5)
WBC: 9.3 10*3/uL (ref 4.0–10.5)
nRBC: 0 % (ref 0.0–0.2)

## 2022-02-10 LAB — SARS CORONAVIRUS 2 BY RT PCR: SARS Coronavirus 2 by RT PCR: NEGATIVE

## 2022-02-10 MED ORDER — DEXAMETHASONE SODIUM PHOSPHATE 10 MG/ML IJ SOLN
10.0000 mg | Freq: Once | INTRAMUSCULAR | Status: AC
  Administered 2022-02-10: 10 mg via INTRAVENOUS
  Filled 2022-02-10: qty 1

## 2022-02-10 MED ORDER — MAGNESIUM SULFATE 2 GM/50ML IV SOLN
2.0000 g | Freq: Once | INTRAVENOUS | Status: AC
  Administered 2022-02-10: 2 g via INTRAVENOUS
  Filled 2022-02-10: qty 50

## 2022-02-10 MED ORDER — PREDNISONE 20 MG PO TABS: 40.0000 mg | ORAL_TABLET | Freq: Every day | ORAL | 0 refills | Status: AC

## 2022-02-10 NOTE — ED Triage Notes (Signed)
Pt reports she has hx of asthma. She reports she has been having episodes of sob and chest tightness. States she ran out of one of her inhalers but was able to use a neb yesterday without relief. Pt states she completed a course of abx last month for pneumonia.

## 2022-02-10 NOTE — ED Provider Notes (Signed)
[facility] DEPT Provider Note   CSN: [id number] Arrival date & time: 02/10/22  1144     History Asthma, HTN,  Chief Complaint  Patient presents with   Shortness of Breath   Chest Pain    Anne Schaefer is a 51 y.o. female.  51 year old female with a past medical history of asthma presents to the ED with a chief complaint of wheezing along with shortness of breath and tightness over the last couple of days.  Patient reports she was recently treated for pneumonia, completed a regimen of antibiotics.  She has began to cough up phlegm with yellow sputum.  She also feels like is very hard for her to catch her breath.  Has been using her albuterol inhaler multiple times without any improvement.  She did use a nebulizer treatment yesterday but there was not much improvement from this.  She does endorse some chest tightness occurring every time she coughs, she feels like she is "sore all over ".  No alleviating factors.  No prior history of blood clots. Does endorse tobacco use.   The history is provided by the patient and medical records.  Shortness of Breath Severity:  Moderate Onset quality:  Sudden Duration:  7 days Timing:  Constant Associated symptoms: chest pain   Associated symptoms: no abdominal pain, no fever and no vomiting   Chest Pain Associated symptoms: shortness of breath   Associated symptoms: no abdominal pain, no back pain, no fever, no nausea and no vomiting        Home Medications Prior to Admission medications   Medication Sig Start Date End Date Taking? Authorizing Provider  predniSONE (DELTASONE) 20 MG tablet Take 2 tablets (40 mg total) by mouth daily for 5 days. 02/10/22 02/15/22 Yes Luvena Wentling, Beverley Fiedler, PA-C  acetaminophen (TYLENOL) 500 MG tablet Take 1,000 mg by mouth every 6 (six) hours as needed for moderate pain.    [provider]  albuterol (PROVENTIL) (2.5 MG/3ML) 0.083% nebulizer solution Take 3 mLs (2.5 mg total) by  nebulization every 6 (six) hours as needed for wheezing or shortness of breath. 01/15/22   Nyoka Lint, PA-C  albuterol (VENTOLIN HFA) 108 (90 Base) MCG/ACT inhaler Inhale 2 puffs into the lungs every 6 (six) hours as needed for wheezing or shortness of breath. 01/15/22   Nyoka Lint, PA-C  amLODipine (NORVASC) 10 MG tablet Take 1 tablet (10 mg total) by mouth daily. 01/15/22   Nyoka Lint, PA-C  ciprofloxacin (CIPRO) 500 MG tablet Take 1 tablet (500 mg total) by mouth 2 (two) times daily. 12/24/15   Davonna Belling, MD  dextromethorphan-guaiFENesin [facility] DM) 30-600 MG 12hr tablet Take 1 tablet by mouth 2 (two) times daily. 01/15/22   Nyoka Lint, PA-C  metroNIDAZOLE (FLAGYL) 500 MG tablet Take 1 tablet (500 mg total) by mouth 3 (three) times daily. 12/24/15   Davonna Belling, MD  mometasone-formoterol [facility]) 200-5 MCG/ACT AERO Inhale 2 puffs into the lungs 2 (two) times daily. 01/15/22   Nyoka Lint, PA-C  oxyCODONE (OXY IR/ROXICODONE) 5 MG immediate release tablet Take 1 tablet (5 mg total) by mouth every 6 (six) hours as needed for moderate pain. 03/24/15   Riebock, Estill Bakes, NP  oxyCODONE-acetaminophen (PERCOCET/ROXICET) 5-325 MG tablet Take 1 tablet by mouth every 8 (eight) hours as needed for severe pain. 12/24/15   Davonna Belling, MD  polyethylene glycol [facility]) packet Take 17 g by mouth daily. 12/24/15   Davonna Belling, MD      Allergies  Ibuprofen, Azithromycin, and Other    Review of Systems   Review of Systems  Constitutional:  Negative for chills and fever.  Respiratory:  Positive for shortness of breath.   Cardiovascular:  Positive for chest pain. Negative for leg swelling.  Gastrointestinal:  Negative for abdominal pain, nausea and vomiting.  Genitourinary:  Negative for flank pain.  Musculoskeletal:  Negative for back pain.  All other systems reviewed and are negative.   Physical Exam Updated Vital Signs BP (!) 145/80   Pulse 86   Temp 98.1 F (36.7 C) (Oral)    Resp 10   Ht '5\' 2"'$  (1.575 m)   Wt 122.5 kg   LMP 12/05/2014   SpO2 91%   BMI 49.38 kg/m  Physical Exam Vitals and nursing note reviewed.  Constitutional:      Appearance: She is well-developed.  HENT:     Head: Normocephalic and atraumatic.  Cardiovascular:     Rate and Rhythm: Normal rate.  Pulmonary:     Effort: Pulmonary effort is normal.     Breath sounds: Wheezing present.     Comments: Wheezing noted throughout especially worse on the right lower side.  Chest:     Chest wall: No tenderness.  Abdominal:     Palpations: Abdomen is soft.  Musculoskeletal:     Right lower leg: No edema.     Left lower leg: No edema.  Skin:    General: Skin is warm and dry.  Neurological:     Mental Status: She is alert and oriented to person, place, and time.     ED Results / Procedures / Treatments   Labs (all labs ordered are listed, but only abnormal results are displayed) Labs Reviewed  CBC WITH DIFFERENTIAL/PLATELET - Abnormal; Notable for the following components:      Result Value   Eosinophils Absolute 0.6 (*)    All other components within normal limits  SARS CORONAVIRUS 2 BY RT PCR  COMPREHENSIVE METABOLIC PANEL  MAGNESIUM    EKG None  Radiology DG Chest 2 View  Result Date: 02/10/2022 CLINICAL DATA:  Wheezing. EXAM: CHEST - 2 VIEW COMPARISON:  August 21, 2015 FINDINGS: Cardiomediastinal silhouette is normal. Mediastinal contours appear intact. Flattening of the hemidiaphragms. No evidence of focal airspace consolidation, pleural effusion or pneumothorax. Osseous structures are without acute abnormality. Soft tissues are grossly normal. IMPRESSION: 1. No active cardiopulmonary disease. 2. Flattening of the hemidiaphragms, usually associated with asthma or COPD. Electronically Signed   By: Fidela Salisbury M.D.   On: 02/10/2022 13:30    Procedures Procedures    Medications Ordered in ED Medications  magnesium sulfate IVPB 2 g 50 mL (2 g Intravenous New  Bag/Given 02/10/22 1256)  dexamethasone (DECADRON) injection 10 mg (10 mg Intravenous Given 02/10/22 1424)    ED Course/ Medical Decision Making/ A&P                           Medical Decision Making Amount and/or Complexity of Data Reviewed Labs: ordered. Radiology: ordered.  Risk Prescription drug management.   This patient presents to the ED for concern of Shortness of breath, this involves a number of treatment options, and is a complaint that carries with it a high risk of complications and morbidity.  The differential diagnosis includes asthma exacerbation, URI versus pneumonia.    Co morbidities: Discussed in HPI   Brief History:  Patient with with   EMR reviewed including pt PMHx, past  surgical history and past visits to ER.   See HPI for more details   Lab Tests:  I ordered and independently interpreted labs.  The pertinent results include:    I personally reviewed all laboratory work and imaging. Metabolic panel without any acute abnormality specifically kidney function within normal limits and no significant electrolyte abnormalities. CBC without leukocytosis or significant anemia.   Imaging Studies:  NAD. I personally reviewed all imaging studies and no acute abnormality found. I agree with radiology interpretation.    Cardiac Monitoring:  The patient was maintained on a cardiac monitor.  I personally viewed and interpreted the cardiac monitored which showed an underlying rhythm of: Sinus tachycardia 110 EKG non-ischemic   Medicines ordered:  I ordered medication including magnesium, decadron  for symptomatic control Reevaluation of the patient after these medicines showed that the patient improved I have reviewed the patients home medicines and have made adjustments as needed  Reevaluation:  After the interventions noted above I re-evaluated patient and found that they have :improved   Social Determinants of Health:  The patient's social  determinants of health were a factor in the care of this patient Patient does report recently relocated here to [facility], does not have any pulmonologist she follows  Extensive review of her chart and I do see a prior visit on July 7 at urgent care, she was previously followed at [facility] and wellness clinic, we discussed following up with them in order to establish primary care.   Problem List / ED Course:  Patient here with shortness of breath for the past couple of days, increase use in her inhaler using this more frequently. Does smoke 2-3 cigarettes daily.  She was recently treated for pneumonia with biotics and finish a total course of 7 days.Labs here are benign. DG chest with no acute findings. Given magnesium along with decadron with improvement in symptoms. Chest tightness with coughing but no leg swelling or palpitations reported. We discussed trial of outpatient steroids to help with symptomatic treatment. She will follow up with  and wellness, who previously took care of her for her asthma.  Chest tightness occurs with coughing episodes.  No hypoxia while in the ED.  Vitals have remained stable, no tachycardia noted.  With obvious wheezing and negative infectious work-up.  I do feel is reasonable to be treated with steroids along with outpatient follow-up.   Dispostion:  After consideration of the diagnostic results and the patients response to treatment, I feel that the patent would benefit from establishing primary care with  and wellness.     Portions of this note were generated with [employer]. Dictation errors may occur despite best attempts at proofreading.   Final Clinical Impression(s) / ED Diagnoses Final diagnoses:  Moderate persistent asthma with exacerbation    Rx / DC Orders ED Discharge Orders          Ordered    predniSONE (DELTASONE) 20 MG tablet  Daily        02/10/22 1356              Janeece Fitting,  PA-C 02/10/22 1432    Blanchie Dessert, MD 02/11/22 1541

## 2022-02-10 NOTE — Discharge Instructions (Addendum)
You were given a prescription for steroids.  Please take 2 tablets daily for the next 5 days.  I have provided a referral to the [facility] health and wellness clinic, to schedule an appointment in order to obtain better control of your asthma.

## 2022-03-03 NOTE — Progress Notes (Unsigned)
Patient ID: [name], female   DOB: [birth year], 51 y.o.   MRN: [id number]   ED visit 02/10/2022 [name] is a 51 y.o. female.   51 year old female with a past medical history of asthma presents to the ED with a chief complaint of wheezing along with shortness of breath and tightness over the last couple of days.  Patient reports she was recently treated for pneumonia, completed a regimen of antibiotics.  She has began to cough up phlegm with yellow sputum.  She also feels like is very hard for her to catch her breath.  Has been using her albuterol inhaler multiple times without any improvement.  She did use a nebulizer treatment yesterday but there was not much improvement from this.  She does endorse some chest tightness occurring every time she coughs, she feels like she is "sore all over ".  No alleviating factors.  No prior history of blood clots. Does endorse tobacco use.   Problem List / ED Course:   Patient here with shortness of breath for the past couple of days, increase use in her inhaler using this more frequently. Does smoke 2-3 cigarettes daily.  She was recently treated for pneumonia with biotics and finish a total course of 7 days.Labs here are benign. DG chest with no acute findings. Given magnesium along with decadron with improvement in symptoms. Chest tightness with coughing but no leg swelling or palpitations reported. We discussed trial of outpatient steroids to help with symptomatic treatment. She will follow up with [facility], who previously took care of her for her asthma.  Chest tightness occurs with coughing episodes.  No hypoxia while in the ED.  Vitals have remained stable, no tachycardia noted.  With obvious wheezing and negative infectious work-up.  I do feel is reasonable to be treated with steroids along with outpatient follow-up.

## 2022-03-04 ENCOUNTER — Other Ambulatory Visit: Payer: Self-pay

## 2022-03-04 ENCOUNTER — Encounter: Payer: Self-pay | Admitting: Physician Assistant

## 2022-03-04 ENCOUNTER — Ambulatory Visit: Payer: Self-pay | Attending: Physician Assistant | Admitting: Physician Assistant

## 2022-03-04 VITALS — BP 124/87 | HR 108 | Temp 98.7°F | Resp 16 | Ht 62.0 in | Wt 273.0 lb

## 2022-03-04 DIAGNOSIS — I10 Essential (primary) hypertension: Secondary | ICD-10-CM

## 2022-03-04 DIAGNOSIS — J4520 Mild intermittent asthma, uncomplicated: Secondary | ICD-10-CM

## 2022-03-04 DIAGNOSIS — Z09 Encounter for follow-up examination after completed treatment for conditions other than malignant neoplasm: Secondary | ICD-10-CM

## 2022-03-04 MED ORDER — AMLODIPINE BESYLATE 10 MG PO TABS
10.0000 mg | ORAL_TABLET | Freq: Every day | ORAL | 1 refills | Status: DC
  Filled 2022-03-04: qty 90, 90d supply, fill #0
  Filled 2022-06-08: qty 90, 90d supply, fill #1

## 2022-03-04 MED ORDER — FLUTICASONE PROPIONATE HFA 110 MCG/ACT IN AERO
1.0000 | INHALATION_SPRAY | Freq: Two times a day (BID) | RESPIRATORY_TRACT | 12 refills | Status: DC
  Filled 2022-03-04: qty 12, 60d supply, fill #0
  Filled 2022-05-19: qty 12, 60d supply, fill #1

## 2022-03-04 MED ORDER — ALBUTEROL SULFATE HFA 108 (90 BASE) MCG/ACT IN AERS
2.0000 | INHALATION_SPRAY | Freq: Four times a day (QID) | RESPIRATORY_TRACT | 3 refills | Status: DC | PRN
  Filled 2022-03-04: qty 6.7, 25d supply, fill #0
  Filled 2022-04-14: qty 6.7, 25d supply, fill #1
  Filled 2022-05-19: qty 6.7, 25d supply, fill #2

## 2022-03-04 MED ORDER — LOSARTAN POTASSIUM 100 MG PO TABS
100.0000 mg | ORAL_TABLET | Freq: Every day | ORAL | 1 refills | Status: DC
  Filled 2022-03-04: qty 90, 90d supply, fill #0
  Filled 2022-06-08: qty 90, 90d supply, fill #1

## 2022-03-10 ENCOUNTER — Other Ambulatory Visit: Payer: Self-pay

## 2022-03-11 ENCOUNTER — Other Ambulatory Visit: Payer: Self-pay

## 2022-03-19 ENCOUNTER — Other Ambulatory Visit: Payer: Self-pay

## 2022-03-19 ENCOUNTER — Emergency Department (HOSPITAL_COMMUNITY)
Admission: EM | Admit: 2022-03-19 | Discharge: 2022-03-19 | Disposition: A | Discharge status: Home / Self Care | Payer: Self-pay | Attending: Emergency Medicine | Admitting: Emergency Medicine

## 2022-03-19 ENCOUNTER — Encounter (HOSPITAL_COMMUNITY): Payer: Self-pay

## 2022-03-19 ENCOUNTER — Emergency Department (HOSPITAL_COMMUNITY): Payer: Self-pay

## 2022-03-19 DIAGNOSIS — Z20822 Contact with and (suspected) exposure to covid-19: Secondary | ICD-10-CM | POA: Insufficient documentation

## 2022-03-19 DIAGNOSIS — J45909 Unspecified asthma, uncomplicated: Secondary | ICD-10-CM | POA: Insufficient documentation

## 2022-03-19 DIAGNOSIS — R0981 Nasal congestion: Secondary | ICD-10-CM | POA: Insufficient documentation

## 2022-03-19 DIAGNOSIS — J069 Acute upper respiratory infection, unspecified: Secondary | ICD-10-CM

## 2022-03-19 LAB — RESP PANEL BY RT-PCR (FLU A&B, COVID) ARPGX2
Influenza A by PCR: NEGATIVE
Influenza B by PCR: NEGATIVE
SARS Coronavirus 2 by RT PCR: NEGATIVE

## 2022-03-19 MED ORDER — PREDNISONE 10 MG PO TABS: 20.0000 mg | ORAL_TABLET | Freq: Every day | ORAL | 0 refills | Status: DC

## 2022-03-19 MED ORDER — PREDNISONE 10 MG PO TABS
20.0000 mg | ORAL_TABLET | Freq: Every day | ORAL | 0 refills | Status: DC
Start: 2022-03-19 — End: 2022-06-08
  Filled 2022-03-19: qty 20, 10d supply, fill #0

## 2022-03-19 MED ORDER — IPRATROPIUM-ALBUTEROL 0.5-2.5 (3) MG/3ML IN SOLN
3.0000 mL | Freq: Once | RESPIRATORY_TRACT | Status: AC
  Administered 2022-03-19: 3 mL via RESPIRATORY_TRACT
  Filled 2022-03-19: qty 3

## 2022-03-19 MED ORDER — PREDNISONE 20 MG PO TABS
60.0000 mg | ORAL_TABLET | Freq: Once | ORAL | Status: AC
  Administered 2022-03-19: 60 mg via ORAL
  Filled 2022-03-19: qty 3

## 2022-03-19 NOTE — Discharge Instructions (Addendum)
Take 40 mg prednisone daily for the next 5 days starting tomorrow was the first dose was given in the ED today.  Take Tylenol and Motrin for any body aches or for fever if you develop 1.  You can use the Flonase for nasal congestion.  I suspect you have a viral URI, this will pass on its own.  If you have chest pain, difficulty breathing return to ED for additional evaluation.  Your chest x-ray was negative and you are COVID-negative.

## 2022-03-19 NOTE — ED Provider Triage Note (Signed)
Emergency Medicine Provider Triage Evaluation Note  Anne Schaefer , a 51 y.o. female  was evaluated in triage.  Pt complains of headache, myalgias, nasal congestion, wheezing.  History of asthma, needing her inhaler more than normal.  Denies chest pain..  Review of Systems  HPI Physical Exam  BP (!) 137/95 (BP Location: Left Arm)   Pulse 91   Temp 98.4 F (36.9 C) (Oral)   Resp 18   LMP 12/05/2014   SpO2 98%  Gen:   Awake, no distress   Resp:  Normal effort  MSK:   Moves extremities without difficulty  Other:  Decreased air movement but no wheezes on auscultation, speaking complete sentences  Medical Decision Making  Medically screening exam initiated at 12:48 PM.  Appropriate orders placed.  Anne Schaefer was informed that the remainder of the evaluation will be completed by another provider, this initial triage assessment does not replace that evaluation, and the importance of remaining in the ED until their evaluation is complete.     Sherrill Raring, PA-C 03/19/22 1249

## 2022-03-19 NOTE — ED Provider Notes (Signed)
[facility] DEPT Provider Note   CSN: [id number] Arrival date & time: 03/19/22  1221     History  Chief Complaint  Patient presents with   Cough   Nasal Congestion    Anne Schaefer is a 51 y.o. female.   Cough    Patient with medical history of anemia, asthma, obesity presents today due to cough and nasal congestion x1 and half days.  She is afebrile, she denies feeling short of breath or having chest pain.  She is using her albuterol p.o. and daily maintenance inhaler without any missed doses or increased necessity.  No known sick contacts.  She endorses myalgias as well.  No meds prior to arrival.  Home Medications Prior to Admission medications   Medication Sig Start Date End Date Taking? Authorizing Provider  acetaminophen (TYLENOL) 500 MG tablet Take 1,000 mg by mouth every 6 (six) hours as needed for moderate pain.    [provider]  albuterol (PROVENTIL) (2.5 MG/3ML) 0.083% nebulizer solution Take 3 mLs (2.5 mg total) by nebulization every 6 (six) hours as needed for wheezing or shortness of breath. 01/15/22   Nyoka Lint, PA-C  albuterol (VENTOLIN HFA) 108 (90 Base) MCG/ACT inhaler Inhale 2 puffs into the lungs every 6 (six) hours as needed for wheezing or shortness of breath. 03/04/22   Argentina Donovan, PA-C  amLODipine (NORVASC) 10 MG tablet Take 1 tablet (10 mg total) by mouth daily. 03/04/22   Argentina Donovan, PA-C  fluticasone (FLOVENT HFA) 110 MCG/ACT inhaler Inhale 1 puff into the lungs 2 (two) times daily. 03/04/22   Argentina Donovan, PA-C  losartan (COZAAR) 100 MG tablet Take 1 tablet (100 mg total) by mouth daily. 03/04/22   Argentina Donovan, PA-C      Allergies    Ibuprofen, Azithromycin, and Other    Review of Systems   Review of Systems  Respiratory:  Positive for cough.     Physical Exam Updated Vital Signs BP (!) 137/95 (BP Location: Left Arm)   Pulse 91   Temp 98.4 F (36.9 C) (Oral)   Resp 18   LMP  12/05/2014   SpO2 98%  Physical Exam Vitals and nursing note reviewed. Exam conducted with a chaperone present.  Constitutional:      Appearance: Normal appearance.  HENT:     Head: Normocephalic and atraumatic.     Nose: Congestion present.  Eyes:     General: No scleral icterus.       Right eye: No discharge.        Left eye: No discharge.     Extraocular Movements: Extraocular movements intact.     Pupils: Pupils are equal, round, and reactive to light.  Cardiovascular:     Rate and Rhythm: Normal rate and regular rhythm.     Pulses: Normal pulses.     Heart sounds: Normal heart sounds. No murmur heard.    No friction rub. No gallop.  Pulmonary:     Effort: Pulmonary effort is normal. No respiratory distress.     Breath sounds: Normal breath sounds.     Comments: Slightly diminished lung sounds but no wheezing.  Speaking complete sentences without any accessory muscle usage. Abdominal:     General: Abdomen is flat. Bowel sounds are normal. There is no distension.     Palpations: Abdomen is soft.     Tenderness: There is no abdominal tenderness.  Musculoskeletal:        General: No tenderness.  Right lower leg: No edema.     Left lower leg: No edema.  Skin:    General: Skin is warm and dry.     Capillary Refill: Capillary refill takes less than 2 seconds.     Coloration: Skin is not jaundiced.  Neurological:     Mental Status: She is alert. Mental status is at baseline.     Coordination: Coordination normal.     ED Results / Procedures / Treatments   Labs (all labs ordered are listed, but only abnormal results are displayed) Labs Reviewed  RESP PANEL BY RT-PCR (FLU A&B, COVID) [id number]    EKG None  Radiology DG Chest 2 View  Result Date: 03/19/2022 CLINICAL DATA:  Shortness of breath EXAM: CHEST - 2 VIEW COMPARISON:  Previous studies including the examination of 02/10/2022 FINDINGS: Cardiac size is within normal limits. Central pulmonary vessels are prominent  without signs of alveolar pulmonary edema. Blunting of right lateral CP angle has not changed, possibly suggesting pleural thickening or minimal effusion. Left lateral CP angle is clear. There is no pneumothorax. IMPRESSION: Central pulmonary vessels are prominent without signs of alveolar pulmonary edema. There is no new focal pulmonary consolidation. Blunting of right lateral CP angle may be due to small effusion or pleural thickening. Electronically Signed   By: Elmer Picker M.D.   On: 03/19/2022 13:29    Procedures Procedures    Medications Ordered in ED Medications  ipratropium-albuterol (DUONEB) 0.5-2.5 (3) MG/3ML nebulizer solution 3 mL (3 mLs Nebulization Given 03/19/22 1325)    ED Course/ Medical Decision Making/ A&P                           Medical Decision Making Amount and/or Complexity of Data Reviewed Radiology: ordered.  Risk Prescription drug management.   Patient presents due to viral symptoms.  Differential includes but not limited to viral URI, pneumonia, allergies, sepsis, asthma exacerbation.  On exam patient is not hypoxic, breathing comfortably and unlabored and satting at 98% on room air.  S1-S2 without any tachycardia.  Abdomen soft nontender, nasal congestion.   I ordered, viewed and interpreted chest x-ray.  Agree with radiologist interpretation of no acute process slight blunting of the costochondral angle.  Ordered and viewed COVID test which is negative.  I ordered DuoNeb given diminished lung sounds.  Air movement is moving better at this point.  Suspect patient likely has a viral URI, will prescribe steroid taper to help prevent exacerbation.  Do not think patient needs any additional work-up at this time given not hypoxic, resting comfortably, stable vital signs and acuity of symptoms.  However follow-up closely with PCP with strict return precautions.         Final Clinical Impression(s) / ED Diagnoses Final diagnoses:  None    Rx  / DC Orders ED Discharge Orders     None         Sherrill Raring, PA-C 03/19/22 2106    Ottie Glazier, DO 03/20/22 [phone]

## 2022-03-19 NOTE — ED Triage Notes (Addendum)
Pt arrived via POV, c/o congestion, headache, productive cough, feeling hot flashes but no fevers. States no known sick contacts.

## 2022-03-25 ENCOUNTER — Other Ambulatory Visit: Payer: Self-pay

## 2022-03-30 ENCOUNTER — Other Ambulatory Visit: Payer: Self-pay

## 2022-04-02 ENCOUNTER — Other Ambulatory Visit: Payer: Self-pay

## 2022-04-15 ENCOUNTER — Other Ambulatory Visit: Payer: Self-pay

## 2022-04-16 ENCOUNTER — Other Ambulatory Visit: Payer: Self-pay

## 2022-05-19 ENCOUNTER — Other Ambulatory Visit: Payer: Self-pay

## 2022-05-21 ENCOUNTER — Other Ambulatory Visit: Payer: Self-pay

## 2022-06-04 ENCOUNTER — Other Ambulatory Visit: Payer: Self-pay

## 2022-06-06 NOTE — Progress Notes (Signed)
New Patient Office Visit  Subjective    Patient ID: Anne Schaefer, female    DOB: [birth year]  Age: 51 y.o. MRN: [id number]  CC:  Chief Complaint  Patient presents with   New Patient (Initial Visit)    HPI Anne Schaefer presents to establish care Patient previously been seen by our PA [place] in August documentation is as below Seen by [facility] 02/2022 Expand All Collapse All Patient ID: Anne Schaefer, female   DOB: 1970-10-23, 51 y.o.   MRN: [id number]   Subjective:    Anne Schaefer is a 51 y.o. female here today for a follow up visit and to establish care ED visit 02/10/2022 for wheezing and SOB and treated for pneumonia.  Shes been living in [place] but has decided to move back here since her fiancee died unexpectedly in 2021-10-30.  Her BP has been controlled on amlodipine and losartan for a while now.  She has still been taking previous meds she had.  She was also controlled with asthma with flovent daily and albuterol prn use.     She is feeling much better/resolved from ED visit   From ED note: Anne Schaefer is a 51 y.o. female.   51 year old female with a past medical history of asthma presents to the ED with a chief complaint of wheezing along with shortness of breath and tightness over the last couple of days.  Patient reports she was recently treated for pneumonia, completed a regimen of antibiotics.  She has began to cough up phlegm with yellow sputum.  She also feels like is very hard for her to catch her breath.  Has been using her albuterol inhaler multiple times without any improvement.  She did use a nebulizer treatment yesterday but there was not much improvement from this.  She does endorse some chest tightness occurring every time she coughs, she feels like she is "sore all over ".  No alleviating factors.  No prior history of blood clots. Does endorse tobacco use.    Problem List / ED Course:   Patient here with shortness of breath for the past couple of  days, increase use in her inhaler using this more frequently. Does smoke 2-3 cigarettes daily.  She was recently treated for pneumonia with biotics and finish a total course of 7 days.Labs here are benign. DG chest with no acute findings. Given magnesium along with decadron with improvement in symptoms. Chest tightness with coughing but no leg swelling or palpitations reported. We discussed trial of outpatient steroids to help with symptomatic treatment. She will follow up with [facility], who previously took care of her for her asthma.  Chest tightness occurs with coughing episodes.  No hypoxia while in the ED.  Vitals have remained stable, no tachycardia noted.  With obvious wheezing and negative infectious work-up.  I do feel is reasonable to be treated with steroids along with outpatient follow-up   1. Mild intermittent asthma without complication Continue current regimen - albuterol (VENTOLIN HFA) 108 (90 Base) MCG/ACT inhaler; Inhale 2 puffs into the lungs every 6 (six) hours as needed for wheezing or shortness of breath.  Dispense: 18 g; Refill: 3 - fluticasone (FLOVENT HFA) 110 MCG/ACT inhaler; Inhale 1 puff into the lungs 2 (two) times daily.  Dispense: 1 each; Refill: 12   2. Essential hypertension, benign Controlled.  Labs from 02/10/2022 reviewed and normal.   - amLODipine (NORVASC) 10 MG tablet; Take 1 tablet (10 mg total) by  mouth daily.  Dispense: 90 tablet; Refill: 1 - losartan (COZAAR) 100 MG tablet; Take 1 tablet (100 mg total) by mouth daily.  Dispense: 90 tablet; Refill: 1   3. Encounter for examination following treatment at hospital  Since that visit she states her asthma has improved it is triggered by strong odors dogs and certain seasonings and cilantro  Patient's largest complaint today is left knee pain she has had a previous injury to the knee.  She was seen at another area in [place] and just moved here now works as a Chartered certified accountant at [employer] is getting  [employer].  She smokes 3 to 4 cigarettes a day.  She lost her fianc suddenly and has been very upset over this.  She needs a colonoscopy Pap smear and mammogram for screenings.  She has no other complaints at this time.  Her breathing is stable.  She would like refills on her inhaled medications. Outpatient Encounter Medications as of 06/08/2022  Medication Sig   azelastine (ASTELIN) 0.1 % nasal spray Place 2 sprays into both nostrils 2 (two) times daily. Use in each nostril as directed   budesonide-formoterol (SYMBICORT) 160-4.5 MCG/ACT inhaler Inhale 2 puffs into the lungs 2 (two) times daily.   nicotine polacrilex (NICORETTE MINI) 4 MG lozenge Use three times daily to quit smoking   [DISCONTINUED] albuterol (VENTOLIN HFA) 108 (90 Base) MCG/ACT inhaler Inhale 2 puffs into the lungs every 6 (six) hours as needed for wheezing or shortness of breath.   [DISCONTINUED] amLODipine (NORVASC) 10 MG tablet Take 1 tablet (10 mg total) by mouth daily.   [DISCONTINUED] budesonide (PULMICORT) 0.25 MG/2ML nebulizer solution Take 2 mLs (0.25 mg dose) by nebulization daily.   [DISCONTINUED] losartan (COZAAR) 100 MG tablet Take 1 tablet (100 mg total) by mouth daily.   acetaminophen (TYLENOL) 500 MG tablet Take 1,000 mg by mouth every 6 (six) hours as needed for moderate pain.   albuterol (PROVENTIL) (2.5 MG/3ML) 0.083% nebulizer solution Take 3 mLs (2.5 mg total) by nebulization every 6 (six) hours as needed for wheezing or shortness of breath.   albuterol (VENTOLIN HFA) 108 (90 Base) MCG/ACT inhaler Inhale 2 puffs into the lungs every 6 (six) hours as needed for wheezing or shortness of breath.   amLODipine (NORVASC) 10 MG tablet Take 1 tablet (10 mg total) by mouth daily.   losartan (COZAAR) 100 MG tablet Take 1 tablet (100 mg total) by mouth daily.   [DISCONTINUED] albuterol (PROVENTIL) (2.5 MG/3ML) 0.083% nebulizer solution Take 3 mLs (2.5 mg total) by nebulization every 6 (six) hours as needed for  wheezing or shortness of breath. (Patient not taking: Reported on 06/08/2022)   [DISCONTINUED] fluticasone (FLOVENT HFA) 110 MCG/ACT inhaler Inhale 1 puff into the lungs 2 (two) times daily.   [DISCONTINUED] predniSONE (DELTASONE) 10 MG tablet Take 2 tablets (20 mg total) by mouth daily.   No facility-administered encounter medications on file as of 06/08/2022.    Past Medical History:  Diagnosis Date   Anemia    on Iron    Asthma    Diverticulitis    Headache    migraine in 2015   Hematoma 01/10/2015   History of blood transfusion    Hypertension    Infection    trich   Obesity    Ovarian cyst    Pneumonia    had 2 times 1997 and 2012   Rectus sheath hematoma s/p hysterectomy 12/31/14 01/12/2015   Seasonal allergies     Past Surgical  History:  Procedure Laterality Date   ABDOMINAL HYSTERECTOMY N/A 12/31/2014   Procedure: HYSTERECTOMY ABDOMINAL;  Surgeon: Woodroe Mode, MD;  Location: [facility];  Service: Gynecology;  Laterality: N/A;   CESAREAN SECTION     FOREHEAD RECONSTRUCTION     as a child   SALPINGOOPHORECTOMY Right 12/31/2014   Procedure: RIGHT SALPINGO OOPHORECTOMY;  Surgeon: Woodroe Mode, MD;  Location: [facility];  Service: Gynecology;  Laterality: Right;   TUBAL LIGATION     WISDOM TOOTH EXTRACTION      Family History  Problem Relation Age of Onset   Diabetes Mother    Heart disease Mother        chf   Hypertension Father     Social History   Socioeconomic History   Marital status: Single    Spouse name: Not on file   Number of children: Not on file   Years of education: Not on file   Highest education level: Not on file  Occupational History   Not on file  Tobacco Use   Smoking status: Some Days    Packs/day: 0.25    Years: 8.00    Total pack years: 2.00    Types: Cigarettes   Smokeless tobacco: Never   Tobacco comments:    Smoke 2 packs during week   Vaping Use   Vaping Use: Never used  Substance and Sexual Activity   Alcohol use: Yes     Comment: occ   Drug use: Not Currently    Types: Marijuana    Comment: occasionally   Sexual activity: Not Currently    Birth control/protection: Surgical  Other Topics Concern   Not on file  Social History Narrative   Not on file   Social Determinants of Health   Financial Resource Strain: Not on file  Food Insecurity: Not on file  Transportation Needs: Not on file  Physical Activity: Not on file  Stress: Not on file  Social Connections: Not on file  Intimate Partner Violence: Not on file    Review of Systems  Constitutional:  Negative for chills, diaphoresis, fever, malaise/fatigue and weight loss.  HENT:  Positive for congestion. Negative for ear discharge, ear pain, hearing loss, nosebleeds, sinus pain, sore throat and tinnitus.   Eyes:  Negative for blurred vision, double vision, photophobia, discharge and redness.  Respiratory:  Positive for cough and shortness of breath. Negative for hemoptysis, sputum production, wheezing and stridor.        No excess mucus  Cardiovascular:  Negative for chest pain, palpitations, orthopnea, claudication, leg swelling and PND.  Gastrointestinal:  Negative for abdominal pain, blood in stool, constipation, diarrhea, heartburn, melena, nausea and vomiting.  Genitourinary:  Negative for dysuria, flank pain, frequency, hematuria and urgency.  Musculoskeletal:  Negative for back pain, falls, joint pain, myalgias and neck pain.  Skin:  Negative for itching and rash.  Neurological:  Negative for dizziness, tingling, tremors, sensory change, speech change, focal weakness, seizures, loss of consciousness, weakness and headaches.  Endo/Heme/Allergies:  Negative for environmental allergies and polydipsia. Does not bruise/bleed easily.  Psychiatric/Behavioral:  Negative for depression, hallucinations, memory loss, substance abuse and suicidal ideas. The patient is not nervous/anxious and does not have insomnia.   All other systems reviewed and are  negative.       Objective    BP 136/87   Pulse 99   Ht '5\' 2"'$  (1.575 m)   Wt 267 lb 12.8 oz (121.5 kg)   LMP 12/05/2014   SpO2  93%   BMI 48.98 kg/m   Physical Exam Vitals reviewed.  Constitutional:      Appearance: Normal appearance. She is well-developed. She is not diaphoretic.  HENT:     Head: Normocephalic and atraumatic.     Nose: Congestion and rhinorrhea present. No nasal deformity, septal deviation or mucosal edema.     Right Sinus: No maxillary sinus tenderness or frontal sinus tenderness.     Left Sinus: No maxillary sinus tenderness or frontal sinus tenderness.     Mouth/Throat:     Mouth: Mucous membranes are moist.     Pharynx: Oropharynx is clear. No oropharyngeal exudate.  Eyes:     General: No scleral icterus.    Conjunctiva/sclera: Conjunctivae normal.     Pupils: Pupils are equal, round, and reactive to light.  Neck:     Thyroid: No thyromegaly.     Vascular: No carotid bruit or JVD.     Trachea: Trachea normal. No tracheal tenderness or tracheal deviation.  Cardiovascular:     Rate and Rhythm: Normal rate and regular rhythm.     Chest Wall: PMI is not displaced.     Pulses: Normal pulses. No decreased pulses.     Heart sounds: Normal heart sounds, S1 normal and S2 normal. Heart sounds not distant. No murmur heard.    No systolic murmur is present.     No diastolic murmur is present.     No friction rub. No gallop. No S3 or S4 sounds.  Pulmonary:     Effort: Pulmonary effort is normal. No tachypnea, accessory muscle usage or respiratory distress.     Breath sounds: Normal breath sounds. No stridor. No decreased breath sounds, wheezing, rhonchi or rales.  Chest:     Chest wall: No tenderness.  Abdominal:     General: Bowel sounds are normal. There is no distension.     Palpations: Abdomen is soft. Abdomen is not rigid.     Tenderness: There is no abdominal tenderness. There is no guarding or rebound.  Musculoskeletal:        General: Normal range  of motion.     Cervical back: Normal range of motion and neck supple. No edema, erythema or rigidity. No muscular tenderness. Normal range of motion.  Lymphadenopathy:     Head:     Right side of head: No submental or submandibular adenopathy.     Left side of head: No submental or submandibular adenopathy.     Cervical: No cervical adenopathy.  Skin:    General: Skin is warm and dry.     Coloration: Skin is not pale.     Findings: No rash.     Nails: There is no clubbing.  Neurological:     Mental Status: She is alert and oriented to person, place, and time.     Sensory: No sensory deficit.  Psychiatric:        Speech: Speech normal.        Behavior: Behavior normal.         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 may yet need to see pulmonary  Will refill medications and begin Symbicort      Relevant Medications   albuterol (PROVENTIL) (2.5 MG/3ML) 0.083% nebulizer solution   albuterol (VENTOLIN HFA) 108 (90 Base) MCG/ACT inhaler   budesonide-formoterol (SYMBICORT) 160-4.5 MCG/ACT inhaler   nicotine polacrilex (NICORETTE MINI) 4 MG lozenge   azelastine (ASTELIN) 0.1 % nasal spray   Other Relevant Orders   IgE     Other   Smoking       Current smoking consumption amount: 4 cigarettes a day  Dicsussion on advise to quit smoking and smoking impacts: Cardiovascular lung  impacts  Patient's willingness to quit: Wants to quit  Methods to quit smoking discussed: Nicotine lozenge  Medication management of smoking session drugs discussed: Nicotine lozenge  Resources provided:  AVS   Setting quit date not established  Follow-up arranged 3 months   Time spent counseling the patient: 5 minutes       Grief reaction with prolonged bereavement    I will assess patient to see counseling      Other Visit Diagnoses     Mild intermittent asthma without complication       Relevant Medications   albuterol (PROVENTIL) (2.5 MG/3ML) 0.083% nebulizer solution   albuterol (VENTOLIN HFA) 108 (90 Base) MCG/ACT inhaler   budesonide-formoterol (SYMBICORT) 160-4.5 MCG/ACT inhaler   Chronic pain of left knee       Relevant Orders   Ambulatory referral to Orthopedic Surgery   Colon cancer screening       Relevant Orders   Ambulatory referral to Gastroenterology   Morbid obesity due to excess calories (HCC)       Relevant Orders   Hemoglobin A1c   Primary hypertension       Relevant Medications   amLODipine (NORVASC) 10 MG tablet   losartan (COZAAR) 100 MG tablet   Other Relevant Orders   CBC with Differential/Platelet   Comprehensive metabolic panel   Thyroid Panel With TSH   Lipid screening       Relevant Orders   Lipid panel   Hyperglycemia       Relevant Orders   Hemoglobin A1c   Encounter for screening mammogram for malignant neoplasm of breast       Relevant Orders   MM DIGITAL SCREENING BILATERAL     45 minutes needed for patient examination multisystem assessments complex decision making  Return in about 2 months (around 08/08/2022) for htn, chronic conditions, asthma.   Asencion Noble, MD

## 2022-06-08 ENCOUNTER — Ambulatory Visit: Payer: Self-pay | Attending: Critical Care Medicine | Admitting: Critical Care Medicine

## 2022-06-08 ENCOUNTER — Telehealth: Payer: Self-pay | Admitting: Critical Care Medicine

## 2022-06-08 ENCOUNTER — Encounter: Payer: Self-pay | Admitting: Critical Care Medicine

## 2022-06-08 ENCOUNTER — Other Ambulatory Visit: Payer: Self-pay

## 2022-06-08 VITALS — BP 136/87 | HR 99 | Ht 62.0 in | Wt 267.8 lb

## 2022-06-08 DIAGNOSIS — I10 Essential (primary) hypertension: Secondary | ICD-10-CM

## 2022-06-08 DIAGNOSIS — M25562 Pain in left knee: Secondary | ICD-10-CM

## 2022-06-08 DIAGNOSIS — J45901 Unspecified asthma with (acute) exacerbation: Secondary | ICD-10-CM

## 2022-06-08 DIAGNOSIS — F17200 Nicotine dependence, unspecified, uncomplicated: Secondary | ICD-10-CM

## 2022-06-08 DIAGNOSIS — G8929 Other chronic pain: Secondary | ICD-10-CM

## 2022-06-08 DIAGNOSIS — J441 Chronic obstructive pulmonary disease with (acute) exacerbation: Secondary | ICD-10-CM | POA: Insufficient documentation

## 2022-06-08 DIAGNOSIS — J4520 Mild intermittent asthma, uncomplicated: Secondary | ICD-10-CM

## 2022-06-08 DIAGNOSIS — F4329 Adjustment disorder with other symptoms: Secondary | ICD-10-CM

## 2022-06-08 DIAGNOSIS — Z1211 Encounter for screening for malignant neoplasm of colon: Secondary | ICD-10-CM

## 2022-06-08 DIAGNOSIS — R739 Hyperglycemia, unspecified: Secondary | ICD-10-CM

## 2022-06-08 DIAGNOSIS — Z1231 Encounter for screening mammogram for malignant neoplasm of breast: Secondary | ICD-10-CM

## 2022-06-08 DIAGNOSIS — Z13220 Encounter for screening for lipoid disorders: Secondary | ICD-10-CM

## 2022-06-08 MED ORDER — AMLODIPINE BESYLATE 10 MG PO TABS: 10.0000 mg | ORAL_TABLET | Freq: Every day | ORAL | 1 refills | Status: DC

## 2022-06-08 MED ORDER — LOSARTAN POTASSIUM 100 MG PO TABS: 100.0000 mg | ORAL_TABLET | Freq: Every day | ORAL | 1 refills | Status: DC

## 2022-06-08 MED ORDER — BUDESONIDE-FORMOTEROL FUMARATE 160-4.5 MCG/ACT IN AERO
2.0000 | INHALATION_SPRAY | Freq: Two times a day (BID) | RESPIRATORY_TRACT | 3 refills | Status: DC
  Filled 2022-06-08 – 2022-06-25 (×3): qty 10.2, 30d supply, fill #0
  Filled 2022-08-12: qty 10.2, 30d supply, fill #1

## 2022-06-08 MED ORDER — AZELASTINE HCL 0.1 % NA SOLN
2.0000 | Freq: Two times a day (BID) | NASAL | 12 refills | Status: DC
  Filled 2022-06-08: qty 30, 30d supply, fill #0

## 2022-06-08 MED ORDER — ALBUTEROL SULFATE HFA 108 (90 BASE) MCG/ACT IN AERS
2.0000 | INHALATION_SPRAY | Freq: Four times a day (QID) | RESPIRATORY_TRACT | 3 refills | Status: DC | PRN
  Filled 2022-06-08: qty 6.7, 25d supply, fill #0
  Filled 2022-08-12: qty 6.7, 25d supply, fill #1

## 2022-06-08 MED ORDER — ALBUTEROL SULFATE (2.5 MG/3ML) 0.083% IN NEBU
2.5000 mg | INHALATION_SOLUTION | Freq: Four times a day (QID) | RESPIRATORY_TRACT | 12 refills | Status: DC | PRN
  Filled 2022-06-08: qty 75, 7d supply, fill #0
  Filled 2022-08-12: qty 75, 7d supply, fill #1

## 2022-06-08 MED ORDER — NICOTINE POLACRILEX 4 MG MT LOZG: LOZENGE | OROMUCOSAL | 4 refills | Status: DC

## 2022-06-08 NOTE — Assessment & Plan Note (Signed)
I will assess patient to see counseling

## 2022-06-08 NOTE — Assessment & Plan Note (Addendum)
Hypertension not fully at goal she needs to get off smoking soon as possible  Plan is to continue amlodipine at a dose of 10 mg daily continue losartan 100 mg daily  Screening labs will be obtained and we will need to see the patient back in short-term follow-up

## 2022-06-08 NOTE — Assessment & Plan Note (Signed)
    Current smoking consumption amount: 4 cigarettes a day  Dicsussion on advise to quit smoking and smoking impacts: Cardiovascular lung impacts  Patient's willingness to quit: Wants to quit  Methods to quit smoking discussed: Nicotine lozenge  Medication management of smoking session drugs discussed: Nicotine lozenge  Resources provided:  AVS   Setting quit date not established  Follow-up arranged 3 months   Time spent counseling the patient: 5 minutes

## 2022-06-08 NOTE — Patient Instructions (Signed)
Complete screening labs will be obtained  Stop smoking use the nicotine lozenge we sent this to the pharmacy downstairs and also a referral to licensed clinical social work for grief counseling and smoking cessation will be made with Rosana Hoes  A mammogram was ordered  A colonoscopy referral was made  An appointment with one of the partners in the practice to be made for a Pap smear  Referral to orthopedics will be made for your left knee pain  Begin [place] 2 sprays each nostril twice daily for nasal congestion  Begin Symbicort 2 puffs twice daily and discontinue Flovent  All other medications were refilled  Review lifestyle medicine handout that we gave you and refer to information as below        Advice for Weight Management   -For most of [place] the best way to lose weight is by diet management. Generally speaking, diet management means consuming less calories intentionally which over time brings about progressive weight loss.  This can be achieved more effectively by avoiding ultra processed carbohydrates, processed meats, unhealthy fats.    It is critically important to know your numbers: how much calorie you are consuming and how much calorie you need. More importantly, our carbohydrates sources should be unprocessed naturally occurring  complex starch food items.  It is always important to balance nutrition also by  appropriate intake of proteins (mainly plant-based), healthy fats/oils, plenty of fruits and vegetables.    -The American College of Lifestyle Medicine (ACL M) recommends nutrition derived mostly from Whole Food, Plant Predominant Sources example an apple instead of applesauce or apple pie. Eat Plenty of vegetables, Mushrooms, fruits, Legumes, Whole Grains, Nuts, seeds in lieu of processed meats, processed snacks/pastries red meat, poultry, eggs.  Use only water or unsweetened tea for hydration.  The College also recommends the need to stay away from risky substances  including alcohol, smoking; obtaining 7-9 hours of restorative sleep, at least 150 minutes of moderate intensity exercise weekly, importance of healthy social connections, and being mindful of stress and seek help when it is overwhelming.     -Sticking to a routine mealtime to eat 3 meals a day and avoiding unnecessary snacks is shown to have a big role in weight control. Under normal circumstances, the only time we burn stored energy is when we are hungry, so allow  some hunger to take place- hunger means no food between appropriate meal times, only water.  It is not advisable to starve.    -It is better to avoid simple carbohydrates including: Cakes, Sweet Desserts, Ice Cream, Soda (diet and regular), Sweet Tea, Candies, Chips, Cookies, Store Bought Juices, Alcohol in Excess of  1-2 drinks a day, Lemonade,  Artificial Sweeteners, Doughnuts, Coffee Creamers, "Sugar-free" Products, etc, etc.  This is not a complete list...Marland Kitchen.    -Consulting with certified diabetes educators is proven to provide you with the most accurate and current information on diet.  Also, you may be  interested in discussing diet options/exchanges , we can schedule a visit with Jearld Fenton, RDN, CDE for individualized nutrition education.   -Exercise: If you are able: 30 -60 minutes a day ,4 days a week, or 150 minutes of moderate intensity exercise weekly.    The longer the better if tolerated.  Combine stretch, strength, and aerobic activities.  If you were told in the past that you have high risk for cardiovascular diseases, or if you are currently symptomatic, you may seek evaluation by your heart doctor prior  to initiating moderate to intense exercise programs.                                    Additional Care Considerations for Diabetes/Prediabetes     -Diabetes  is a chronic disease.  The most important care consideration is regular follow-up with your diabetes care provider with the goal being avoiding or delaying its  complications and to take advantage of advances in medications and technology.  If appropriate actions are taken early enough, type 2 diabetes can even be reversed.  Seek information from the right source.   - Whole Food, Plant Predominant Nutrition is highly recommended: Eat Plenty of vegetables, Mushrooms, fruits, Legumes, Whole Grains, Nuts, seeds in lieu of processed meats, processed snacks/pastries red meat, poultry, eggs as recommended by [employer] of  Lifestyle Medicine (ACLM).   -Type 2 diabetes is known to coexist with other important comorbidities such as high blood pressure and high cholesterol.  It is critical to control not only the diabetes but also the high blood pressure and high cholesterol to minimize and delay the risk of complications including coronary artery disease, stroke, amputations, blindness, etc.  The good news is that this diet recommendation for type 2 diabetes is also very helpful for managing high cholesterol and high blood blood pressure.   - Studies showed that people with diabetes will benefit from a class of medications known as ACE inhibitors and statins.  Unless there are specific reasons not to be on these medications, the standard of care is to consider getting one from these groups of medications at an optimal doses.  These medications are generally considered safe and proven to help protect the heart and the kidneys.     - People with diabetes are encouraged to initiate and maintain regular follow-up with eye doctors, foot doctors, dentists , and if necessary heart and kidney doctors.      - It is highly recommended that people with diabetes quit smoking or stay away from smoking, and get yearly  flu vaccine and pneumonia vaccine at least every 5 years.  See above for additional recommendations on exercise, sleep, stress management , and healthy social connections.     Return to Dr. Joya Gaskins 2 months

## 2022-06-08 NOTE — Telephone Encounter (Signed)
Please see this patient for grief reaction and smoking cessation counseling she lost her fianc suddenly

## 2022-06-08 NOTE — Assessment & Plan Note (Signed)
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

## 2022-06-09 ENCOUNTER — Other Ambulatory Visit: Payer: Self-pay

## 2022-06-09 ENCOUNTER — Other Ambulatory Visit (HOSPITAL_COMMUNITY): Payer: Self-pay

## 2022-06-11 LAB — CBC WITH DIFFERENTIAL/PLATELET
Basophils Absolute: 0.1 10*3/uL (ref 0.0–0.2)
Basos: 1 %
EOS (ABSOLUTE): 0.3 10*3/uL (ref 0.0–0.4)
Eos: 3 %
Hematocrit: 40.8 % (ref 34.0–46.6)
Hemoglobin: 13.8 g/dL (ref 11.1–15.9)
Immature Grans (Abs): 0 10*3/uL (ref 0.0–0.1)
Immature Granulocytes: 0 %
Lymphocytes Absolute: 3.2 10*3/uL — ABNORMAL HIGH (ref 0.7–3.1)
Lymphs: 38 %
MCH: 30 pg (ref 26.6–33.0)
MCHC: 33.8 g/dL (ref 31.5–35.7)
MCV: 89 fL (ref 79–97)
Monocytes Absolute: 0.5 10*3/uL (ref 0.1–0.9)
Monocytes: 6 %
Neutrophils Absolute: 4.4 10*3/uL (ref 1.4–7.0)
Neutrophils: 52 %
Platelets: 251 10*3/uL (ref 150–450)
RBC: 4.6 x10E6/uL (ref 3.77–5.28)
RDW: 12.4 % (ref 11.7–15.4)
WBC: 8.4 10*3/uL (ref 3.4–10.8)

## 2022-06-11 LAB — LIPID PANEL
Chol/HDL Ratio: 3.9 ratio (ref 0.0–4.4)
Cholesterol, Total: 156 mg/dL (ref 100–199)
HDL: 40 mg/dL (ref >39)
LDL Chol Calc (NIH): 94 mg/dL (ref 0–99)
Triglycerides: 125 mg/dL (ref 0–149)
VLDL Cholesterol Cal: 22 mg/dL (ref 5–40)

## 2022-06-11 LAB — COMPREHENSIVE METABOLIC PANEL
ALT: 19 IU/L (ref 0–32)
AST: 15 IU/L (ref 0–40)
Albumin/Globulin Ratio: 1.6 (ref 1.2–2.2)
Albumin: 4.2 g/dL (ref 3.9–4.9)
Alkaline Phosphatase: 69 IU/L (ref 44–121)
BUN/Creatinine Ratio: 13 (ref 9–23)
BUN: 14 mg/dL (ref 6–24)
Bilirubin Total: 0.2 mg/dL (ref 0.0–1.2)
CO2: 24 mmol/L (ref 20–29)
Calcium: 9.9 mg/dL (ref 8.7–10.2)
Chloride: 103 mmol/L (ref 96–106)
Creatinine, Ser: 1.06 mg/dL — ABNORMAL HIGH (ref 0.57–1.00)
Globulin, Total: 2.6 g/dL (ref 1.5–4.5)
Glucose: 125 mg/dL — ABNORMAL HIGH (ref 70–99)
Potassium: 4.2 mmol/L (ref 3.5–5.2)
Sodium: 141 mmol/L (ref 134–144)
Total Protein: 6.8 g/dL (ref 6.0–8.5)
eGFR: 65 mL/min/{1.73_m2} (ref >59)

## 2022-06-11 LAB — IGE: IgE (Immunoglobulin E), Serum: 309 IU/mL (ref 6–495)

## 2022-06-11 LAB — HEMOGLOBIN A1C
Est. average glucose Bld gHb Est-mCnc: 137 mg/dL
Hgb A1c MFr Bld: 6.4 % — ABNORMAL HIGH (ref 4.8–5.6)

## 2022-06-11 LAB — THYROID PANEL WITH TSH
Free Thyroxine Index: 1.6 (ref 1.2–4.9)
T3 Uptake Ratio: 23 % — ABNORMAL LOW (ref 24–39)
T4, Total: 6.9 ug/dL (ref 4.5–12.0)
TSH: 1.53 u[IU]/mL (ref 0.450–4.500)

## 2022-06-11 NOTE — Progress Notes (Signed)
Let pt know labs all normal

## 2022-06-14 ENCOUNTER — Telehealth: Payer: Self-pay

## 2022-06-14 NOTE — Telephone Encounter (Signed)
-----   Message from Elsie Stain, MD sent at 06/11/2022  3:33 PM EST ----- Let pt know labs all normal

## 2022-06-14 NOTE — Telephone Encounter (Signed)
Pt was called and is aware of results, DOB was confirmed.  ?

## 2022-06-15 ENCOUNTER — Ambulatory Visit: Payer: Self-pay | Admitting: Surgery

## 2022-06-16 ENCOUNTER — Other Ambulatory Visit: Payer: Self-pay

## 2022-06-24 ENCOUNTER — Other Ambulatory Visit: Payer: Self-pay

## 2022-06-25 ENCOUNTER — Other Ambulatory Visit: Payer: Self-pay

## 2022-07-14 NOTE — Telephone Encounter (Signed)
Please assist. If you can include me in the route so I can know the status of the patients care. Thank you! I called patient and they did not answer.

## 2022-07-21 ENCOUNTER — Telehealth: Payer: Self-pay | Admitting: Licensed Clinical Social Worker

## 2022-07-21 NOTE — Patient Outreach (Signed)
  Care Coordination   07/21/2022 Name: Anne Schaefer MRN: [id number] DOB: 08/23/1970   Care Coordination Outreach Attempts:  An unsuccessful telephone outreach was attempted today to offer the patient information about available care coordination services as a benefit of their health plan.   Follow Up Plan:  Additional outreach attempts will be made to offer the patient care coordination information and services.   Encounter Outcome:  No Answer   Care Coordination Interventions:  No, not indicated    Christa See, [email or web address], [email or web address] Phone [phone] 11:08 AM

## 2022-07-30 ENCOUNTER — Telehealth: Payer: Self-pay | Admitting: Licensed Clinical Social Worker

## 2022-07-30 NOTE — Patient Instructions (Signed)
Visit Information  Thank you for taking time to visit with me today. Please don't hesitate to contact me if I can be of assistance to you.   Following are the goals we discussed today:   Goals Addressed   None     Our next appointment is by telephone on 08/02/22 at 3:30 PM  Please call the care guide team at [phone] if you need to cancel or reschedule your appointment.   If you are experiencing a Mental Health or [place] or need someone to talk to, please call the Suicide and Crisis Lifeline: 988 call 911   Patient verbalizes understanding of instructions and care plan provided today and agrees to view in [place]. Active MyChart status and patient understanding of how to access instructions and care plan via MyChart confirmed with patient.     Christa See, MSW, [email or web address] Phone [phone] 4:17 PM

## 2022-07-30 NOTE — Patient Outreach (Signed)
  Care Coordination   Initial Visit Note   07/30/2022 Name: Anne Schaefer MRN: [id number] DOB: 1970/09/04  Anne Schaefer is a 52 y.o. year old female who sees Elsie Stain, MD for primary care. I spoke with  Anne Schaefer by phone today.  What matters to the patients health and wellness today? LCSW introduced self and informed her of Care Coordination services. Initial appt scheduled   Goals Addressed   None     SDOH assessments and interventions completed:  No     Care Coordination Interventions:  Yes, provided   Follow up plan: Follow up call scheduled for 08/02/22    Encounter Outcome:  Pt. Visit Completed

## 2022-08-02 ENCOUNTER — Telehealth: Payer: Self-pay | Admitting: Licensed Clinical Social Worker

## 2022-08-02 ENCOUNTER — Encounter: Payer: Self-pay | Admitting: Licensed Clinical Social Worker

## 2022-08-02 NOTE — Patient Outreach (Signed)
  Care Coordination   08/02/2022 Name: Anne Schaefer MRN: [id number] DOB: 1971-05-23   Care Coordination Outreach Attempts:  An unsuccessful telephone outreach was attempted for a scheduled appointment today.  Follow Up Plan:  No further outreach attempts will be made at this time. We have been unable to contact the patient to offer or enroll patient in care coordination services  Encounter Outcome:  No Answer   Care Coordination Interventions:  No, not indicated    Christa See, MSW, [email or web address] Phone [phone] 3:37 PM

## 2022-08-05 ENCOUNTER — Ambulatory Visit: Payer: Self-pay

## 2022-08-11 NOTE — Progress Notes (Unsigned)
New Patient Office Visit  Subjective    Patient ID: Anne Schaefer, female    DOB: April 03, 1971  Age: 52 y.o. MRN: [id number]  CC:  No chief complaint on file.   HPI Anne Schaefer presents to establish care Patient previously been seen by our PA [place] in August documentation is as below Seen by [facility] 02/2022 Expand All Collapse All Patient ID: Anne Schaefer, female   DOB: 1971/06/21, 52 y.o.   MRN: [id number]   Subjective:    Anne Schaefer is a 52 y.o. female here today for a follow up visit and to establish care ED visit 02/10/2022 for wheezing and SOB and treated for pneumonia.  Shes been living in [place] but has decided to move back here since her fiancee died unexpectedly in 13-Nov-2021.  Her BP has been controlled on amlodipine and losartan for a while now.  She has still been taking previous meds she had.  She was also controlled with asthma with flovent daily and albuterol prn use.     She is feeling much better/resolved from ED visit   From ED note: Anne Schaefer is a 53 y.o. female.   52 year old female with a past medical history of asthma presents to the ED with a chief complaint of wheezing along with shortness of breath and tightness over the last couple of days.  Patient reports she was recently treated for pneumonia, completed a regimen of antibiotics.  She has began to cough up phlegm with yellow sputum.  She also feels like is very hard for her to catch her breath.  Has been using her albuterol inhaler multiple times without any improvement.  She did use a nebulizer treatment yesterday but there was not much improvement from this.  She does endorse some chest tightness occurring every time she coughs, she feels like she is "sore all over ".  No alleviating factors.  No prior history of blood clots. Does endorse tobacco use.    Problem List / ED Course:   Patient here with shortness of breath for the past couple of days, increase use in her inhaler using this  more frequently. Does smoke 2-3 cigarettes daily.  She was recently treated for pneumonia with biotics and finish a total course of 7 days.Labs here are benign. DG chest with no acute findings. Given magnesium along with decadron with improvement in symptoms. Chest tightness with coughing but no leg swelling or palpitations reported. We discussed trial of outpatient steroids to help with symptomatic treatment. She will follow up with [facility], who previously took care of her for her asthma.  Chest tightness occurs with coughing episodes.  No hypoxia while in the ED.  Vitals have remained stable, no tachycardia noted.  With obvious wheezing and negative infectious work-up.  I do feel is reasonable to be treated with steroids along with outpatient follow-up   1. Mild intermittent asthma without complication Continue current regimen - albuterol (VENTOLIN HFA) 108 (90 Base) MCG/ACT inhaler; Inhale 2 puffs into the lungs every 6 (six) hours as needed for wheezing or shortness of breath.  Dispense: 18 g; Refill: 3 - fluticasone (FLOVENT HFA) 110 MCG/ACT inhaler; Inhale 1 puff into the lungs 2 (two) times daily.  Dispense: 1 each; Refill: 12   2. Essential hypertension, benign Controlled.  Labs from 02/10/2022 reviewed and normal.   - amLODipine (NORVASC) 10 MG tablet; Take 1 tablet (10 mg total) by mouth daily.  Dispense: 90 tablet; Refill: 1 -  losartan (COZAAR) 100 MG tablet; Take 1 tablet (100 mg total) by mouth daily.  Dispense: 90 tablet; Refill: 1   3. Encounter for examination following treatment at hospital  Since that visit she states her asthma has improved it is triggered by strong odors dogs and certain seasonings and cilantro  Patient's largest complaint today is left knee pain she has had a previous injury to the knee.  She was seen at another area in [place] and just moved here now works as a Chartered certified accountant at [employer] is getting [employer].  She smokes 3 to 4 cigarettes  a day.  She lost her fianc suddenly and has been very upset over this.  She needs a colonoscopy Pap smear and mammogram for screenings.  She has no other complaints at this time.  Her breathing is stable.  She would like refills on her inhaled medications. Outpatient Encounter Medications as of 08/12/2022  Medication Sig   acetaminophen (TYLENOL) 500 MG tablet Take 1,000 mg by mouth every 6 (six) hours as needed for moderate pain.   albuterol (PROVENTIL) (2.5 MG/3ML) 0.083% nebulizer solution Take 3 mLs (2.5 mg total) by nebulization every 6 (six) hours as needed for wheezing or shortness of breath.   albuterol (VENTOLIN HFA) 108 (90 Base) MCG/ACT inhaler Inhale 2 puffs into the lungs every 6 (six) hours as needed for wheezing or shortness of breath.   amLODipine (NORVASC) 10 MG tablet Take 1 tablet (10 mg total) by mouth daily.   azelastine (ASTELIN) 0.1 % nasal spray Place 2 sprays into both nostrils 2 (two) times daily. Use in each nostril as directed   budesonide-formoterol (SYMBICORT) 160-4.5 MCG/ACT inhaler Inhale 2 puffs into the lungs 2 (two) times daily.   losartan (COZAAR) 100 MG tablet Take 1 tablet (100 mg total) by mouth daily.   nicotine polacrilex (NICORETTE MINI) 4 MG lozenge Use three times daily to quit smoking   No facility-administered encounter medications on file as of 08/12/2022.    Past Medical History:  Diagnosis Date   Anemia    on Iron    Asthma    Diverticulitis    Headache    migraine in 2015   Hematoma 01/10/2015   History of blood transfusion    Hypertension    Infection    trich   Obesity    Ovarian cyst    Pneumonia    had 2 times 1997 and 2012   Rectus sheath hematoma s/p hysterectomy 12/31/14 01/12/2015   Seasonal allergies     Past Surgical History:  Procedure Laterality Date   ABDOMINAL HYSTERECTOMY N/A 12/31/2014   Procedure: HYSTERECTOMY ABDOMINAL;  Surgeon: Woodroe Mode, MD;  Location: [facility];  Service: Gynecology;  Laterality: N/A;    CESAREAN SECTION     FOREHEAD RECONSTRUCTION     as a child   SALPINGOOPHORECTOMY Right 12/31/2014   Procedure: RIGHT SALPINGO OOPHORECTOMY;  Surgeon: Woodroe Mode, MD;  Location: [facility];  Service: Gynecology;  Laterality: Right;   TUBAL LIGATION     WISDOM TOOTH EXTRACTION      Family History  Problem Relation Age of Onset   Diabetes Mother    Heart disease Mother        chf   Hypertension Father     Social History   Socioeconomic History   Marital status: Single    Spouse name: Not on file   Number of children: Not on file   Years of education: Not on file  Highest education level: Not on file  Occupational History   Not on file  Tobacco Use   Smoking status: Some Days    Packs/day: 0.25    Years: 8.00    Total pack years: 2.00    Types: Cigarettes   Smokeless tobacco: Never   Tobacco comments:    Smoke 2 packs during week   Vaping Use   Vaping Use: Never used  Substance and Sexual Activity   Alcohol use: Yes    Comment: occ   Drug use: Not Currently    Types: Marijuana    Comment: occasionally   Sexual activity: Not Currently    Birth control/protection: Surgical  Other Topics Concern   Not on file  Social History Narrative   Not on file   Social Determinants of Health   Financial Resource Strain: Not on file  Food Insecurity: Not on file  Transportation Needs: Not on file  Physical Activity: Not on file  Stress: Not on file  Social Connections: Not on file  Intimate Partner Violence: Not on file    Review of Systems  Constitutional:  Negative for chills, diaphoresis, fever, malaise/fatigue and weight loss.  HENT:  Positive for congestion. Negative for ear discharge, ear pain, hearing loss, nosebleeds, sinus pain, sore throat and tinnitus.   Eyes:  Negative for blurred vision, double vision, photophobia, discharge and redness.  Respiratory:  Positive for cough and shortness of breath. Negative for hemoptysis, sputum production, wheezing and  stridor.        No excess mucus  Cardiovascular:  Negative for chest pain, palpitations, orthopnea, claudication, leg swelling and PND.  Gastrointestinal:  Negative for abdominal pain, blood in stool, constipation, diarrhea, heartburn, melena, nausea and vomiting.  Genitourinary:  Negative for dysuria, flank pain, frequency, hematuria and urgency.  Musculoskeletal:  Negative for back pain, falls, joint pain, myalgias and neck pain.  Skin:  Negative for itching and rash.  Neurological:  Negative for dizziness, tingling, tremors, sensory change, speech change, focal weakness, seizures, loss of consciousness, weakness and headaches.  Endo/Heme/Allergies:  Negative for environmental allergies and polydipsia. Does not bruise/bleed easily.  Psychiatric/Behavioral:  Negative for depression, hallucinations, memory loss, substance abuse and suicidal ideas. The patient is not nervous/anxious and does not have insomnia.   All other systems reviewed and are negative.       Objective    LMP 12/05/2014   Physical Exam Vitals reviewed.  Constitutional:      Appearance: Normal appearance. She is well-developed. She is not diaphoretic.  HENT:     Head: Normocephalic and atraumatic.     Nose: Congestion and rhinorrhea present. No nasal deformity, septal deviation or mucosal edema.     Right Sinus: No maxillary sinus tenderness or frontal sinus tenderness.     Left Sinus: No maxillary sinus tenderness or frontal sinus tenderness.     Mouth/Throat:     Mouth: Mucous membranes are moist.     Pharynx: Oropharynx is clear. No oropharyngeal exudate.  Eyes:     General: No scleral icterus.    Conjunctiva/sclera: Conjunctivae normal.     Pupils: Pupils are equal, round, and reactive to light.  Neck:     Thyroid: No thyromegaly.     Vascular: No carotid bruit or JVD.     Trachea: Trachea normal. No tracheal tenderness or tracheal deviation.  Cardiovascular:     Rate and Rhythm: Normal rate and regular  rhythm.     Chest Wall: PMI is not displaced.  Pulses: Normal pulses. No decreased pulses.     Heart sounds: Normal heart sounds, S1 normal and S2 normal. Heart sounds not distant. No murmur heard.    No systolic murmur is present.     No diastolic murmur is present.     No friction rub. No gallop. No S3 or S4 sounds.  Pulmonary:     Effort: Pulmonary effort is normal. No tachypnea, accessory muscle usage or respiratory distress.     Breath sounds: Normal breath sounds. No stridor. No decreased breath sounds, wheezing, rhonchi or rales.  Chest:     Chest wall: No tenderness.  Abdominal:     General: Bowel sounds are normal. There is no distension.     Palpations: Abdomen is soft. Abdomen is not rigid.     Tenderness: There is no abdominal tenderness. There is no guarding or rebound.  Musculoskeletal:        General: Normal range of motion.     Cervical back: Normal range of motion and neck supple. No edema, erythema or rigidity. No muscular tenderness. Normal range of motion.  Lymphadenopathy:     Head:     Right side of head: No submental or submandibular adenopathy.     Left side of head: No submental or submandibular adenopathy.     Cervical: No cervical adenopathy.  Skin:    General: Skin is warm and dry.     Coloration: Skin is not pale.     Findings: No rash.     Nails: There is no clubbing.  Neurological:     Mental Status: She is alert and oriented to person, place, and time.     Sensory: No sensory deficit.  Psychiatric:        Speech: Speech normal.        Behavior: Behavior normal.         Assessment & Plan:   Problem List Items Addressed This Visit   None 45 minutes needed for patient examination multisystem assessments complex decision making  No follow-ups on file.   Asencion Noble, MD

## 2022-08-12 ENCOUNTER — Ambulatory Visit: Payer: Self-pay | Admitting: Family Medicine

## 2022-08-12 ENCOUNTER — Other Ambulatory Visit: Payer: Self-pay

## 2022-08-12 ENCOUNTER — Telehealth: Payer: Self-pay | Admitting: Licensed Clinical Social Worker

## 2022-08-12 ENCOUNTER — Encounter: Payer: Self-pay | Admitting: Critical Care Medicine

## 2022-08-12 ENCOUNTER — Ambulatory Visit: Payer: Self-pay | Attending: Critical Care Medicine | Admitting: Critical Care Medicine

## 2022-08-12 ENCOUNTER — Ambulatory Visit: Payer: Self-pay | Admitting: Physician Assistant

## 2022-08-12 DIAGNOSIS — Z716 Tobacco abuse counseling: Secondary | ICD-10-CM | POA: Insufficient documentation

## 2022-08-12 DIAGNOSIS — Z6841 Body Mass Index (BMI) 40.0 and over, adult: Secondary | ICD-10-CM | POA: Insufficient documentation

## 2022-08-12 DIAGNOSIS — Z79899 Other long term (current) drug therapy: Secondary | ICD-10-CM | POA: Insufficient documentation

## 2022-08-12 DIAGNOSIS — F4329 Adjustment disorder with other symptoms: Secondary | ICD-10-CM

## 2022-08-12 DIAGNOSIS — K029 Dental caries, unspecified: Secondary | ICD-10-CM | POA: Insufficient documentation

## 2022-08-12 DIAGNOSIS — Z634 Disappearance and death of family member: Secondary | ICD-10-CM | POA: Insufficient documentation

## 2022-08-12 DIAGNOSIS — J4520 Mild intermittent asthma, uncomplicated: Secondary | ICD-10-CM | POA: Insufficient documentation

## 2022-08-12 DIAGNOSIS — R0789 Other chest pain: Secondary | ICD-10-CM | POA: Insufficient documentation

## 2022-08-12 DIAGNOSIS — I10 Essential (primary) hypertension: Secondary | ICD-10-CM | POA: Insufficient documentation

## 2022-08-12 DIAGNOSIS — F17210 Nicotine dependence, cigarettes, uncomplicated: Secondary | ICD-10-CM | POA: Insufficient documentation

## 2022-08-12 DIAGNOSIS — R0602 Shortness of breath: Secondary | ICD-10-CM | POA: Insufficient documentation

## 2022-08-12 DIAGNOSIS — Z1211 Encounter for screening for malignant neoplasm of colon: Secondary | ICD-10-CM | POA: Insufficient documentation

## 2022-08-12 DIAGNOSIS — F17200 Nicotine dependence, unspecified, uncomplicated: Secondary | ICD-10-CM

## 2022-08-12 DIAGNOSIS — M25562 Pain in left knee: Secondary | ICD-10-CM | POA: Insufficient documentation

## 2022-08-12 MED ORDER — AMLODIPINE BESYLATE 10 MG PO TABS
10.0000 mg | ORAL_TABLET | Freq: Every day | ORAL | 1 refills | Status: DC
  Filled 2022-08-12 – 2022-09-08 (×2): qty 90, 90d supply, fill #0
  Filled 2022-12-02: qty 90, 90d supply, fill #1

## 2022-08-12 MED ORDER — LOSARTAN POTASSIUM 100 MG PO TABS
100.0000 mg | ORAL_TABLET | Freq: Every day | ORAL | 1 refills | Status: DC
  Filled 2022-08-12 – 2022-09-08 (×2): qty 90, 90d supply, fill #0
  Filled 2022-12-02: qty 90, 90d supply, fill #1

## 2022-08-12 MED ORDER — BUDESONIDE-FORMOTEROL FUMARATE 160-4.5 MCG/ACT IN AERO
2.0000 | INHALATION_SPRAY | Freq: Two times a day (BID) | RESPIRATORY_TRACT | 3 refills | Status: DC
  Filled 2022-09-08 – 2022-09-23 (×2): qty 10.2, 30d supply, fill #0
  Filled 2022-11-04 (×3): qty 10.2, 30d supply, fill #1
  Filled 2022-12-02: qty 10.2, 30d supply, fill #2
  Filled 2023-01-11: qty 10.2, 30d supply, fill #3

## 2022-08-12 MED ORDER — ALBUTEROL SULFATE HFA 108 (90 BASE) MCG/ACT IN AERS
2.0000 | INHALATION_SPRAY | Freq: Four times a day (QID) | RESPIRATORY_TRACT | 3 refills | Status: DC | PRN
  Filled 2022-09-08 – 2022-09-23 (×2): qty 6.7, 25d supply, fill #0
  Filled 2022-11-04 (×3): qty 6.7, 25d supply, fill #1
  Filled 2022-12-02: qty 6.7, 25d supply, fill #2
  Filled 2023-01-11: qty 6.7, 25d supply, fill #3

## 2022-08-12 NOTE — Assessment & Plan Note (Signed)
    Current smoking consumption amount: 4 cigarettes a day  Dicsussion on advise to quit smoking and smoking impacts: Cardiovascular lung impacts  Patient's willingness to quit: Wants to quit  Methods to quit smoking discussed: Nicotine lozenge  Medication management of smoking session drugs discussed: Nicotine lozenge  Resources provided:  AVS   Setting quit date not established  Follow-up arranged 3 months   Time spent counseling the patient: 5 minutes

## 2022-08-12 NOTE — Patient Instructions (Signed)
Colonoscopy will be scheduled for colon cancer screening and a Pap smear will be scheduled with our PA here at the clinic  Focus on tobacco cessation as we discussed and also get a nicotine replacement product over-the-counter and as well speak to our clinical social worker [place] call her for an appointment on grief counseling as well for smoking cessation  No medication changes all medicines were refilled  Please see your dentist to have the teeth addressed  Return to Dr. Joya Gaskins 5 months  Follow lifestyle medicine approach we discussed and see the handout and below         Advice for Weight Management   -For most of [place] the best way to lose weight is by diet management. Generally speaking, diet management means consuming less calories intentionally which over time brings about progressive weight loss.  This can be achieved more effectively by avoiding ultra processed carbohydrates, processed meats, unhealthy fats.    It is critically important to know your numbers: how much calorie you are consuming and how much calorie you need. More importantly, our carbohydrates sources should be unprocessed naturally occurring  complex starch food items.  It is always important to balance nutrition also by  appropriate intake of proteins (mainly plant-based), healthy fats/oils, plenty of fruits and vegetables.    -The American College of Lifestyle Medicine (ACL M) recommends nutrition derived mostly from Whole Food, Plant Predominant Sources example an apple instead of applesauce or apple pie. Eat Plenty of vegetables, Mushrooms, fruits, Legumes, Whole Grains, Nuts, seeds in lieu of processed meats, processed snacks/pastries red meat, poultry, eggs.  Use only water or unsweetened tea for hydration.  The College also recommends the need to stay away from risky substances including alcohol, smoking; obtaining 7-9 hours of restorative sleep, at least 150 minutes of moderate intensity exercise weekly, importance  of healthy social connections, and being mindful of stress and seek help when it is overwhelming.     -Sticking to a routine mealtime to eat 3 meals a day and avoiding unnecessary snacks is shown to have a big role in weight control. Under normal circumstances, the only time we burn stored energy is when we are hungry, so allow  some hunger to take place- hunger means no food between appropriate meal times, only water.  It is not advisable to starve.    -It is better to avoid simple carbohydrates including: Cakes, Sweet Desserts, Ice Cream, Soda (diet and regular), Sweet Tea, Candies, Chips, Cookies, Store Bought Juices, Alcohol in Excess of  1-2 drinks a day, Lemonade,  Artificial Sweeteners, Doughnuts, Coffee Creamers, "Sugar-free" Products, etc, etc.  This is not a complete list...Marland Kitchen.    -Consulting with certified diabetes educators is proven to provide you with the most accurate and current information on diet.  Also, you may be  interested in discussing diet options/exchanges , we can schedule a visit with Jearld Fenton, RDN, CDE for individualized nutrition education.   -Exercise: If you are able: 30 -60 minutes a day ,4 days a week, or 150 minutes of moderate intensity exercise weekly.    The longer the better if tolerated.  Combine stretch, strength, and aerobic activities.  If you were told in the past that you have high risk for cardiovascular diseases, or if you are currently symptomatic, you may seek evaluation by your heart doctor prior to initiating moderate to intense exercise programs.  Additional Care Considerations for Diabetes/Prediabetes     -Diabetes  is a chronic disease.  The most important care consideration is regular follow-up with your diabetes care provider with the goal being avoiding or delaying its complications and to take advantage of advances in medications and technology.  If appropriate actions are taken early enough, type 2 diabetes  can even be reversed.  Seek information from the right source.   - Whole Food, Plant Predominant Nutrition is highly recommended: Eat Plenty of vegetables, Mushrooms, fruits, Legumes, Whole Grains, Nuts, seeds in lieu of processed meats, processed snacks/pastries red meat, poultry, eggs as recommended by [employer] of  Lifestyle Medicine (ACLM).   -Type 2 diabetes is known to coexist with other important comorbidities such as high blood pressure and high cholesterol.  It is critical to control not only the diabetes but also the high blood pressure and high cholesterol to minimize and delay the risk of complications including coronary artery disease, stroke, amputations, blindness, etc.  The good news is that this diet recommendation for type 2 diabetes is also very helpful for managing high cholesterol and high blood blood pressure.   - Studies showed that people with diabetes will benefit from a class of medications known as ACE inhibitors and statins.  Unless there are specific reasons not to be on these medications, the standard of care is to consider getting one from these groups of medications at an optimal doses.  These medications are generally considered safe and proven to help protect the heart and the kidneys.     - People with diabetes are encouraged to initiate and maintain regular follow-up with eye doctors, foot doctors, dentists , and if necessary heart and kidney doctors.      - It is highly recommended that people with diabetes quit smoking or stay away from smoking, and get yearly  flu vaccine and pneumonia vaccine at least every 5 years.  See above for additional recommendations on exercise, sleep, stress management , and healthy social connections.

## 2022-08-12 NOTE — Assessment & Plan Note (Signed)
Blood pressure at goal continue current medication 

## 2022-08-12 NOTE — Assessment & Plan Note (Signed)
Continue with grief reaction she has an appointment with licensed clinical social work for counseling will also receive smoking cessation counseling

## 2022-08-12 NOTE — Assessment & Plan Note (Signed)
Continue lifestyle medicine approach The following Lifestyle Medicine recommendations according to [facility]) were discussed and offered to patient who agrees to start the journey:  A. Whole Foods, Plant-based plate comprising of fruits and vegetables, plant-based proteins, whole-grain carbohydrates was discussed in detail with the patient.   A list for source of those nutrients were also provided to the patient.  Patient will use only water or unsweetened tea for hydration. B.  The need to stay away from risky substances including alcohol, smoking; obtaining 7 to 9 hours of restorative sleep, at least 150 minutes of moderate intensity exercise weekly, the importance of healthy social connections,  and stress reduction techniques were discussed. C.  A full color page of  Calorie density of various food groups per pound showing examples of each food groups was provided to the patient.

## 2022-08-13 NOTE — Patient Instructions (Signed)
Visit Information  Thank you for taking time to visit with me today. Please don't hesitate to contact me if I can be of assistance to you.   Following are the goals we discussed today:   Goals Addressed             This Visit's Progress    Obtain Supportive Resources (Grief/Housing)   On track    Care Coordination Interventions: Solution-Focused Strategies employed:  Active listening / Reflection utilized  Emotional Support Provided Participation in counseling encouraged  Participation in support group encouraged  Verbalization of feelings encouraged  Validation and Encouragement provided LCSW spoke with PCP, who shared he is encouraging pt to implement lifestyle medicine (diet) to assist with decrease in A1C 6.4 and smoking cessation Patient reports difficulty managing symptoms of depression/anxiety triggered by extensive grief. Support resources discussed and strategies to assist with coping identified Patient has an application to assist with obtaining housing to submit to employer. Patient provided info Can For Each Other           Our next appointment is by telephone on 08/17/22 at 9 AM  Please call the care guide team at [phone] if you need to cancel or reschedule your appointment.   If you are experiencing a Mental Health or [place] or need someone to talk to, please call the Suicide and Crisis Lifeline: 988 call 911   Patient verbalizes understanding of instructions and care plan provided today and agrees to view in [place]. Active MyChart status and patient understanding of how to access instructions and care plan via MyChart confirmed with patient.     Christa See, MSW, [email or web address] Phone [phone] 8:26 AM

## 2022-08-13 NOTE — Patient Outreach (Signed)
  Care Coordination   Initial Visit Note   08/13/2022 Name: Anne Schaefer MRN: [id number] DOB: 11-06-1970  Anne Schaefer is a 52 y.o. year old female who sees Elsie Stain, MD for primary care. I spoke with  Berenice Bouton by phone today.  What matters to the patients health and wellness today?  Grief/Housing    Goals Addressed             This Visit's Progress    Obtain Supportive Resources (Grief/Housing)   On track    Care Coordination Interventions: Solution-Focused Strategies employed:  Active listening / Reflection utilized  Emotional Support Provided Participation in counseling encouraged  Participation in support group encouraged  Verbalization of feelings encouraged  Validation and Encouragement provided LCSW spoke with PCP, who shared he is encouraging pt to implement lifestyle medicine (diet) to assist with decrease in A1C 6.4 and smoking cessation Patient reports difficulty managing symptoms of depression/anxiety triggered by extensive grief. Support resources discussed and strategies to assist with coping identified Patient has an application to assist with obtaining housing to submit to employer. Patient provided info Can For Each Other           SDOH assessments and interventions completed:  Yes  SDOH Interventions Today    Flowsheet Row Most Recent Value  SDOH Interventions   Food Insecurity Interventions Intervention Not Indicated  Housing Interventions Other (Comment)  [Patient has ppwk for agency assistance]        Care Coordination Interventions:  Yes, provided   Follow up plan: Follow up call scheduled for 1-2 weeks    Encounter Outcome:  Pt. Visit Completed   Christa See, MSW, [email or web address] Phone [phone] 8:25 AM

## 2022-08-17 ENCOUNTER — Telehealth: Payer: Self-pay | Admitting: Licensed Clinical Social Worker

## 2022-08-17 ENCOUNTER — Encounter: Payer: Self-pay | Admitting: Licensed Clinical Social Worker

## 2022-08-17 NOTE — Patient Outreach (Signed)
  Care Coordination   08/17/2022 Name: Anne Schaefer MRN: [id number] DOB: May 23, 1971   Care Coordination Outreach Attempts:  A second unsuccessful outreach was attempted today to offer the patient with information about available care coordination services as a benefit of their health plan.     Follow Up Plan:  Additional outreach attempts will be made to offer the patient care coordination information and services.   Encounter Outcome:  No Answer   Care Coordination Interventions:  No, not indicated    Christa See, MSW, [email or web address] Phone [phone] 5:49 PM

## 2022-08-18 ENCOUNTER — Ambulatory Visit: Payer: Self-pay

## 2022-08-19 ENCOUNTER — Encounter: Payer: Self-pay | Admitting: Gastroenterology

## 2022-08-25 ENCOUNTER — Ambulatory Visit: Payer: Self-pay | Admitting: Physician Assistant

## 2022-09-08 ENCOUNTER — Other Ambulatory Visit: Payer: Self-pay

## 2022-09-10 ENCOUNTER — Other Ambulatory Visit: Payer: Self-pay

## 2022-09-15 ENCOUNTER — Encounter: Payer: Self-pay | Admitting: General Practice

## 2022-09-15 ENCOUNTER — Encounter: Payer: Self-pay | Admitting: Physician Assistant

## 2022-09-15 ENCOUNTER — Ambulatory Visit: Payer: Self-pay | Attending: Physician Assistant | Admitting: Physician Assistant

## 2022-09-15 ENCOUNTER — Ambulatory Visit (AMBULATORY_SURGERY_CENTER): Payer: Self-pay | Admitting: *Deleted

## 2022-09-15 ENCOUNTER — Other Ambulatory Visit: Payer: Self-pay

## 2022-09-15 ENCOUNTER — Other Ambulatory Visit (HOSPITAL_COMMUNITY)
Admission: RE | Admit: 2022-09-15 | Discharge: 2022-09-15 | Disposition: A | Discharge status: Home / Self Care | Payer: Self-pay | Source: Ambulatory Visit | Attending: Physician Assistant | Admitting: Physician Assistant

## 2022-09-15 VITALS — BP 133/85 | HR 87 | Wt 272.4 lb

## 2022-09-15 VITALS — Ht 62.0 in | Wt 272.0 lb

## 2022-09-15 DIAGNOSIS — Z124 Encounter for screening for malignant neoplasm of cervix: Secondary | ICD-10-CM

## 2022-09-15 DIAGNOSIS — Z113 Encounter for screening for infections with a predominantly sexual mode of transmission: Secondary | ICD-10-CM | POA: Insufficient documentation

## 2022-09-15 DIAGNOSIS — R7303 Prediabetes: Secondary | ICD-10-CM

## 2022-09-15 DIAGNOSIS — N898 Other specified noninflammatory disorders of vagina: Secondary | ICD-10-CM

## 2022-09-15 DIAGNOSIS — Z1211 Encounter for screening for malignant neoplasm of colon: Secondary | ICD-10-CM

## 2022-09-15 DIAGNOSIS — K089 Disorder of teeth and supporting structures, unspecified: Secondary | ICD-10-CM

## 2022-09-15 LAB — CERVICOVAGINAL ANCILLARY ONLY
Bacterial Vaginitis (gardnerella): POSITIVE — AB
Candida Glabrata: NEGATIVE
Candida Vaginitis: NEGATIVE
Chlamydia: NEGATIVE
Comment: NEGATIVE
Comment: NEGATIVE
Comment: NEGATIVE
Comment: NEGATIVE
Comment: NEGATIVE
Comment: NORMAL
Neisseria Gonorrhea: NEGATIVE
Trichomonas: NEGATIVE

## 2022-09-15 MED ORDER — NA SULFATE-K SULFATE-MG SULF 17.5-3.13-1.6 GM/177ML PO SOLN
1.0000 | Freq: Once | ORAL | 0 refills | Status: DC
  Filled 2022-09-15: qty 354, 2d supply, fill #0
  Filled 2022-10-04: qty 354, 1d supply, fill #0

## 2022-09-15 NOTE — Progress Notes (Signed)
Patient ID: Anne Schaefer, female   DOB: Dec 01, 1970, 52 y.o.   MRN: [id number]   Anne Schaefer, is a 52 y.o. female  [id number]  [id number]  DOB - 03-03-1971  Chief Complaint  Patient presents with   Gynecologic Exam       Subjective:   Anne Schaefer is a 53 y.o. female here today for pap, referral to dentistry and wants to test for vaginal STD.  S/p hysterectomy and singular oopherectomy.  Says she is aware of prediabetes.  She is working on [employer].  She declines metformin.     No problems updated.  ALLERGIES: Allergies  Allergen Reactions   Ibuprofen Shortness Of Breath   Azithromycin Hives   Other Itching and Rash    Pt states that she is allergic to "[place] [employer]".        PAST MEDICAL HISTORY: Past Medical History:  Diagnosis Date   Anemia    on Iron    Asthma    Diverticulitis    Headache    migraine in 2015   Hematoma 01/10/2015   History of blood transfusion    Hypertension    Infection    trich   Obesity    Ovarian cyst    Pneumonia    had 2 times 1997 and 2012   Rectus sheath hematoma s/p hysterectomy 12/31/14 01/12/2015   Seasonal allergies     MEDICATIONS AT HOME: Prior to Admission medications   Medication Sig Start Date End Date Taking? Authorizing Provider  acetaminophen (TYLENOL) 500 MG tablet Take 1,000 mg by mouth every 6 (six) hours as needed for moderate pain.   Yes [provider]  albuterol (PROVENTIL) (2.5 MG/3ML) 0.083% nebulizer solution Take 3 mLs (2.5 mg total) by nebulization every 6 (six) hours as needed for wheezing or shortness of breath. 06/08/22  Yes Elsie Stain, MD  albuterol (VENTOLIN HFA) 108 (90 Base) MCG/ACT inhaler Inhale 2 puffs into the lungs every 6 (six) hours as needed for wheezing or shortness of breath. 08/12/22  Yes Elsie Stain, MD  amLODipine (NORVASC) 10 MG tablet Take 1 tablet (10 mg total) by mouth daily. 08/12/22  Yes Elsie Stain, MD   budesonide-formoterol [facility]) 160-4.5 MCG/ACT inhaler Inhale 2 puffs into the lungs 2 (two) times daily. 08/12/22  Yes Elsie Stain, MD  losartan (COZAAR) 100 MG tablet Take 1 tablet (100 mg total) by mouth daily. 08/12/22  Yes Elsie Stain, MD  nicotine polacrilex (NICORETTE MINI) 4 MG lozenge Use three times daily to quit smoking 06/08/22  Yes Elsie Stain, MD    ROS: Neg HEENT Neg resp Neg cardiac Neg GI Neg MS Neg psych Neg neuro  Objective:   Vitals:   09/15/22 0842  BP: 133/85  Pulse: 87  SpO2: 96%  Weight: 272 lb 6.4 oz (123.6 kg)   Exam General appearance : Awake, alert, not in any distress. Speech Clear. Not toxic looking HEENT: Atraumatic and Normocephalic, poor dentition-missing some teeth Neck: Supple, no JVD. No cervical lymphadenopathy.  Chest: Good air entry bilaterally, CTAB.  No rales/rhonchi/wheezing CVS: S1 S2 regular, no murmurs.  GU: external genitalia WNL-about 1/2 way into vagina there is a hyperpigmented lesion that is flat and smooth with slightly irregular borders.  Not friable.  Pap taken but no cervix present.  Bimanual unremarkable Extremities: B/L Lower Ext shows no edema, both legs are warm to touch Neurology: Awake alert, and oriented X 3, CN II-XII intact, Non focal Skin: No  Rash  Data Review Lab Results  Component Value Date   HGBA1C 6.4 (H) 06/08/2022   HGBA1C 6.0 (H) 04/09/2012    Assessment & Plan   1. Vaginal lesion Inside vagina Refer gynecology   2. Screening for cervical cancer - Cytology - PAP([facility])   3. Screening examination for STD (sexually transmitted disease) - Cervicovaginal ancillary only  4. Poor dentition - Ambulatory referral to Dentistry  5. Prediabetes I have had a lengthy discussion and provided education about insulin resistance and the intake of too much sugar/refined carbohydrates.  I have advised the patient to work at a goal of eliminating sugary drinks, candy, desserts,  sweets, refined sugars, processed foods, and white carbohydrates.  The patient expresses understanding.      Return in about 6 months (around 03/18/2023) for Dr Joya Gaskins for chronic conditions.  The patient was given clear instructions to go to ER or return to medical center if symptoms don't improve, worsen or new problems develop. The patient verbalized understanding. The patient was told to call to get lab results if they haven't heard anything in the next week.      Freeman Caldron, PA-C [facility] [place], [place]   09/15/2022, 8:59 AM

## 2022-09-15 NOTE — Progress Notes (Signed)
No egg or soy allergy known to patient  No issues known to pt with past sedation with any surgeries or procedures Patient denies ever being told they had issues or difficulty with intubation  No FH of Malignant Hyperthermia Pt is not on diet pills Pt is not on home 02  Pt is not on blood thinners  Pt denies issues with constipation  Pt is not on dialysis Pt denies any upcoming cardiac testing Pt encouraged to use to use Singlecare or Goodrx to reduce cost  Patient's chart reviewed by Osvaldo Angst CNRA prior to previsit and patient appropriate for the [place].  Pre-visit completed and red dot placed by patient's name on their procedure day (on provider's schedule).  ,. Patient's chart reviewed by Osvaldo Angst CNRA prior to pre-visit and patient appropriate for the [place].  Pre-visit completed and red dot placed by patient's name on their procedure day (on provider's schedule).  Instructions reviewed with pt and pt states understanding. Instructed to review again prior to procedure. Pt states they will  Instructions sent by mail with coupon if applicable and by my chart Visit in person

## 2022-09-16 ENCOUNTER — Other Ambulatory Visit: Payer: Self-pay | Admitting: Physician Assistant

## 2022-09-16 DIAGNOSIS — N760 Acute vaginitis: Secondary | ICD-10-CM

## 2022-09-16 MED ORDER — METRONIDAZOLE 500 MG PO TABS: 500.0000 mg | ORAL_TABLET | Freq: Two times a day (BID) | ORAL | 0 refills | Status: DC

## 2022-09-20 LAB — CYTOLOGY - PAP
Adequacy: ABSENT
Comment: NEGATIVE
Diagnosis: NEGATIVE
Diagnosis: REACTIVE
High risk HPV: NEGATIVE

## 2022-09-21 ENCOUNTER — Other Ambulatory Visit: Payer: Self-pay

## 2022-09-22 ENCOUNTER — Telehealth: Payer: Self-pay | Admitting: *Deleted

## 2022-09-22 NOTE — Telephone Encounter (Signed)
Medication needs to go to different pharmacy.

## 2022-09-22 NOTE — Telephone Encounter (Signed)
Patient called for lab results and notified:  Your vaginal swabs showed bacterial vaginitis which is an imbalance in the bacteria in the vagina.  This is not an STD.  I sent you a prescription that will help with this.  You are negative for chlamydia, gonorrhea, trichomonas and yeast are negative.  Follow up as planned.  Pap is not back yet.  Follow up as planned.  Thanks, Freeman Caldron, PA-C  Pap smear was normal. Next pap due in 2027. Thanks, Freeman Caldron, PA-C   Patient has not picked up Rx- she states she does not use [facility]- she would like Rx sent to [facility]

## 2022-09-23 ENCOUNTER — Other Ambulatory Visit: Payer: Self-pay | Admitting: Physician Assistant

## 2022-09-23 ENCOUNTER — Other Ambulatory Visit: Payer: Self-pay

## 2022-09-23 DIAGNOSIS — N760 Acute vaginitis: Secondary | ICD-10-CM

## 2022-09-23 MED ORDER — METRONIDAZOLE 500 MG PO TABS
500.0000 mg | ORAL_TABLET | Freq: Two times a day (BID) | ORAL | 0 refills | Status: DC
  Filled 2022-09-23: qty 14, 7d supply, fill #0

## 2022-09-23 NOTE — Telephone Encounter (Signed)
Pt has been informed of medication being sent to [facility] pharmacy. She has picked up the medication.

## 2022-09-27 ENCOUNTER — Encounter: Payer: Self-pay | Admitting: Gastroenterology

## 2022-09-30 ENCOUNTER — Telehealth: Payer: Self-pay

## 2022-09-30 NOTE — Telephone Encounter (Signed)
-----   Message from Milly Jakob, [place] sent at 09/30/2022  2:48 PM EDT -----  ----- Message ----- From: Carilyn Goodpasture, RN Sent: 09/22/2022  12:40 PM EDT To: Milly Jakob, CMA   ----- Message ----- From: Mathis Dad Sent: 09/21/2022   8:47 AM EDT To: Carilyn Goodpasture, RN  Pap smear was normal. Next pap due in 2027. Thanks, Freeman Caldron, PA-C

## 2022-09-30 NOTE — Telephone Encounter (Signed)
Pt was called and vm was left, Information has been sent to nurse pool.   

## 2022-10-04 ENCOUNTER — Other Ambulatory Visit: Payer: Self-pay

## 2022-10-05 ENCOUNTER — Other Ambulatory Visit: Payer: Self-pay

## 2022-10-06 ENCOUNTER — Ambulatory Visit (AMBULATORY_SURGERY_CENTER): Payer: Self-pay | Admitting: Gastroenterology

## 2022-10-06 ENCOUNTER — Ambulatory Visit: Payer: Self-pay

## 2022-10-06 ENCOUNTER — Encounter: Payer: Self-pay | Admitting: Gastroenterology

## 2022-10-06 VITALS — BP 147/85 | HR 80 | Temp 98.0°F | Resp 12 | Ht 63.25 in | Wt 267.0 lb

## 2022-10-06 DIAGNOSIS — J45909 Unspecified asthma, uncomplicated: Secondary | ICD-10-CM | POA: Diagnosis not present

## 2022-10-06 DIAGNOSIS — I10 Essential (primary) hypertension: Secondary | ICD-10-CM | POA: Diagnosis not present

## 2022-10-06 DIAGNOSIS — Z1211 Encounter for screening for malignant neoplasm of colon: Secondary | ICD-10-CM

## 2022-10-06 MED ORDER — SODIUM CHLORIDE 0.9 % IV SOLN: 500.0000 mL | INTRAVENOUS | Status: DC

## 2022-10-06 NOTE — Progress Notes (Signed)
A and O x3. Report to RN. Tolerated MAC anesthesia well. 

## 2022-10-06 NOTE — Progress Notes (Signed)
History and Physical:  This patient presents for endoscopic testing for: Encounter Diagnosis  Name Primary?   Special screening for malignant neoplasms, colon Yes    Average risk for colorectal cancer.  First screening exam. Reports prior diverticulitis.Denies rectal bleeding  Patient is otherwise without complaints or active issues today.   Past Medical History: Past Medical History:  Diagnosis Date   Allergy    Anemia    on Iron    Asthma    Diverticulitis    Headache    migraine in 2015   Hematoma 01/10/2015   History of blood transfusion    Hypertension    Infection    trich   Obesity    Ovarian cyst    Pneumonia    had 2 times 1997 and 2012   Rectus sheath hematoma s/p hysterectomy 12/31/14 01/12/2015   Seasonal allergies      Past Surgical History: Past Surgical History:  Procedure Laterality Date   ABDOMINAL HYSTERECTOMY N/A 12/31/2014   Procedure: HYSTERECTOMY ABDOMINAL;  Surgeon: Woodroe Mode, MD;  Location: [facility];  Service: Gynecology;  Laterality: N/A;   CESAREAN SECTION     FOREHEAD RECONSTRUCTION     as a child   SALPINGOOPHORECTOMY Right 12/31/2014   Procedure: RIGHT SALPINGO OOPHORECTOMY;  Surgeon: Woodroe Mode, MD;  Location: [facility];  Service: Gynecology;  Laterality: Right;   TUBAL LIGATION     WISDOM TOOTH EXTRACTION      Allergies: Allergies  Allergen Reactions   Ibuprofen Shortness Of Breath   Azithromycin Hives   Other Itching and Rash    Pt states that she is allergic to "[place] [employer]".        Outpatient Meds: Current Outpatient Medications  Medication Sig Dispense Refill   albuterol (VENTOLIN HFA) 108 (90 Base) MCG/ACT inhaler Inhale 2 puffs into the lungs every 6 (six) hours as needed for wheezing or shortness of breath. 6.7 g 3   amLODipine (NORVASC) 10 MG tablet Take 1 tablet (10 mg total) by mouth daily. 90 tablet 1   budesonide-formoterol (SYMBICORT) 160-4.5 MCG/ACT inhaler Inhale 2 puffs into the lungs 2 (two) times  daily. 10.2 g 3   losartan (COZAAR) 100 MG tablet Take 1 tablet (100 mg total) by mouth daily. 90 tablet 1   acetaminophen (TYLENOL) 500 MG tablet Take 1,000 mg by mouth every 6 (six) hours as needed for moderate pain.     albuterol (PROVENTIL) (2.5 MG/3ML) 0.083% nebulizer solution Take 3 mLs (2.5 mg total) by nebulization every 6 (six) hours as needed for wheezing or shortness of breath. 75 mL 12   budesonide-formoterol (SYMBICORT) 160-4.5 MCG/ACT inhaler Inhale 2 puffs into the lungs 2 (two) times daily.     metroNIDAZOLE (FLAGYL) 500 MG tablet Take 1 tablet (500 mg total) by mouth 2 (two) times daily. (Patient not taking: Reported on 10/06/2022) 14 tablet 0   Current Facility-Administered Medications  Medication Dose Route Frequency Provider Last Rate Last Admin   0.9 %  sodium chloride infusion  500 mL Intravenous Continuous Nelida Meuse III, MD          ___________________________________________________________________ Objective   Exam:  BP 127/64   Pulse 98   Temp 98 F (36.7 C)   Resp 18   Ht 5' 3.25" (1.607 m)   Wt 267 lb (121.1 kg)   LMP 12/05/2014   SpO2 100%   BMI 46.92 kg/m   CV: regular , S1/S2 Resp: clear to auscultation bilaterally, normal RR and  effort noted GI: soft, no tenderness, with active bowel sounds.   Assessment: Encounter Diagnosis  Name Primary?   Special screening for malignant neoplasms, colon Yes     Plan: Colonoscopy  The benefits and risks of the planned procedure were described in detail with the patient or (when appropriate) their health care proxy.  Risks were outlined as including, but not limited to, bleeding, infection, perforation, adverse medication reaction leading to cardiac or pulmonary decompensation, pancreatitis (if ERCP).  The limitation of incomplete mucosal visualization was also discussed.  No guarantees or warranties were given.    The patient is appropriate for an endoscopic procedure in the ambulatory  setting.   - Wilfrid Lund, MD

## 2022-10-06 NOTE — Patient Instructions (Signed)
YOU HAD AN ENDOSCOPIC PROCEDURE TODAY AT THE [facility]:   Refer to the procedure report that was given to you for any specific questions about what was found during the examination.  If the procedure report does not answer your questions, please call your gastroenterologist to clarify.  If you requested that your care partner not be given the details of your procedure findings, then the procedure report has been included in a sealed envelope for you to review at your convenience later.  YOU SHOULD EXPECT: Some feelings of bloating in the abdomen. Passage of more gas than usual.  Walking can help get rid of the air that was put into your GI tract during the procedure and reduce the bloating. If you had a lower endoscopy (such as a colonoscopy or flexible sigmoidoscopy) you may notice spotting of blood in your stool or on the toilet paper. If you underwent a bowel prep for your procedure, you may not have a normal bowel movement for a few days.  Please Note:  You might notice some irritation and congestion in your nose or some drainage.  This is from the oxygen used during your procedure.  There is no need for concern and it should clear up in a day or so.  SYMPTOMS TO REPORT IMMEDIATELY:  Following lower endoscopy (colonoscopy or flexible sigmoidoscopy):  Excessive amounts of blood in the stool  Significant tenderness or worsening of abdominal pains  Swelling of the abdomen that is new, acute  Fever of 100F or higher  For urgent or emergent issues, a gastroenterologist can be reached at any hour by calling [phone]. Do not use MyChart messaging for urgent concerns.    DIET:  We do recommend a small meal at first, but then you may proceed to your regular diet.  Drink plenty of fluids but you should avoid alcoholic beverages for 24 hours.  ACTIVITY:  You should plan to take it easy for the rest of today and you should NOT DRIVE or use heavy machinery until tomorrow (because of  the sedation medicines used during the test).    FOLLOW UP: Our staff will call the number listed on your records the next business day following your procedure.  We will call around 7:15- 8:00 am to check on you and address any questions or concerns that you may have regarding the information given to you following your procedure. If we do not reach you, we will leave a message.     SIGNATURES/CONFIDENTIALITY: You and/or your care partner have signed paperwork which will be entered into your electronic medical record.  These signatures attest to the fact that that the information above on your After Visit Summary has been reviewed and is understood.  Full responsibility of the confidentiality of this discharge information lies with you and/or your care-partner. 

## 2022-10-06 NOTE — Op Note (Signed)
[facility] Patient Name: Anne Schaefer Procedure Date: 10/06/2022 10:37 AM MRN: [id number] Endoscopist: Mallie Mussel L. Loletha Carrow , MD, [id number] Age: 52 Referring MD:  Date of Birth: 04-Jan-1971 Gender: Female Account #: [id number] Procedure:                Colonoscopy Indications:              Screening for colorectal malignant neoplasm, This                            is the patient's first colonoscopy Medicines:                Monitored Anesthesia Care Procedure:                Pre-Anesthesia Assessment:                           - Prior to the procedure, a History and Physical                            was performed, and patient medications and                            allergies were reviewed. The patient's tolerance of                            previous anesthesia was also reviewed. The risks                            and benefits of the procedure and the sedation                            options and risks were discussed with the patient.                            All questions were answered, and informed consent                            was obtained. Prior Anticoagulants: The patient has                            taken no anticoagulant or antiplatelet agents. ASA                            Grade Assessment: III - A patient with severe                            systemic disease. After reviewing the risks and                            benefits, the patient was deemed in satisfactory                            condition to undergo the procedure.  After obtaining informed consent, the colonoscope                            was passed under direct vision. Throughout the                            procedure, the patient's blood pressure, pulse, and                            oxygen saturations were monitored continuously. The                            CF [id number] [id number] was introduced through the anus                            and advanced to  the the cecum, identified by                            appendiceal orifice and ileocecal valve. The                            [id number] Colonoscope [id number] was introduced                            through the and advanced to the. The colonoscopy                            was extremely difficult due to multiple diverticula                            in the colon, a redundant colon, a tortuous colon                            and the patient's body habitus. Successful                            completion of the procedure was aided by using                            manual pressure, withdrawing the scope and                            replacing with the pediatric colonoscope,                            straightening and shortening the scope to obtain                            bowel loop reduction and lavage. The patient                            tolerated the procedure fairly well. The quality of  the bowel preparation was good. The ileocecal                            valve, appendiceal orifice, and rectum were                            photographed (rectal retroflexion photo did not                            capture). The bowel preparation used was SUPREP. Scope In: 10:43:49 AM Scope Out: 11:15:30 AM Scope Withdrawal Time: 0 hours 10 minutes 30 seconds  Total Procedure Duration: 0 hours 31 minutes 41 seconds  Findings:                 The perianal and digital rectal examinations were                            normal.                           Repeat examination of right colon under NBI                            performed.                           Many diverticula were found in the left colon.                           The sigmoid colon was significantly tortuous.                           Internal hemorrhoids were found. Complications:            No immediate complications. Estimated Blood Loss:     Estimated blood loss: none. Impression:                - Diverticulosis in the left colon.                           - Tortuous colon.                           - Internal hemorrhoids.                           - No specimens collected.                           - The GI Genius (intelligent endoscopy module),                            computer-aided polyp detection system powered by AI                            was utilized to detect colorectal polyps through  enhanced visualization during colonoscopy. Recommendation:           - Patient has a contact number available for                            emergencies. The signs and symptoms of potential                            delayed complications were discussed with the                            patient. Return to normal activities tomorrow.                            Written discharge instructions were provided to the                            patient.                           - Resume previous diet.                           - Continue present medications.                           - Repeat colonoscopy in 10 years for screening                            purposes. Alexxia Stankiewicz L. Loletha Carrow, MD 10/06/2022 11:20:24 AM This report has been signed electronically.

## 2022-10-07 ENCOUNTER — Ambulatory Visit: Payer: Self-pay

## 2022-10-07 ENCOUNTER — Telehealth: Payer: Self-pay

## 2022-10-07 NOTE — Telephone Encounter (Signed)
Follow up call to pt, lm for pt to call if having any difficulty with normal activities or eating and drinking.  Also to call if any other questions or concerns.  

## 2022-10-08 ENCOUNTER — Ambulatory Visit
Admission: RE | Admit: 2022-10-08 | Discharge: 2022-10-08 | Disposition: A | Discharge status: Home / Self Care | Payer: 59 | Source: Ambulatory Visit | Attending: Critical Care Medicine | Admitting: Critical Care Medicine

## 2022-10-08 DIAGNOSIS — Z1231 Encounter for screening mammogram for malignant neoplasm of breast: Secondary | ICD-10-CM | POA: Diagnosis not present

## 2022-10-11 NOTE — Progress Notes (Signed)
Let pt know mammo normal recheck one  year

## 2022-10-11 NOTE — Progress Notes (Signed)
Let pt know mammo normal recheck one year

## 2022-10-12 ENCOUNTER — Telehealth: Payer: Self-pay

## 2022-10-12 NOTE — Telephone Encounter (Signed)
-----   Message from Elsie Stain, MD sent at 10/11/2022  3:11 PM EDT ----- Let pt know mammo normal recheck one year

## 2022-10-12 NOTE — Telephone Encounter (Signed)
Pt was called and vm was left, Information has been sent to nurse pool.   

## 2022-10-12 NOTE — Telephone Encounter (Signed)
-----   Message from Elsie Stain, MD sent at 10/11/2022  3:11 PM EDT ----- Let pt know mammo normal recheck one  year

## 2022-10-29 ENCOUNTER — Encounter (HOSPITAL_COMMUNITY): Payer: Self-pay

## 2022-10-29 ENCOUNTER — Emergency Department (HOSPITAL_COMMUNITY)
Admission: EM | Admit: 2022-10-29 | Discharge: 2022-10-30 | Discharge status: Against Medical Advice | Payer: 59 | Attending: Emergency Medicine | Admitting: Emergency Medicine

## 2022-10-29 DIAGNOSIS — R42 Dizziness and giddiness: Secondary | ICD-10-CM | POA: Insufficient documentation

## 2022-10-29 DIAGNOSIS — R12 Heartburn: Secondary | ICD-10-CM | POA: Insufficient documentation

## 2022-10-29 DIAGNOSIS — Z5321 Procedure and treatment not carried out due to patient leaving prior to being seen by health care provider: Secondary | ICD-10-CM | POA: Insufficient documentation

## 2022-10-29 DIAGNOSIS — R110 Nausea: Secondary | ICD-10-CM | POA: Insufficient documentation

## 2022-10-29 LAB — URINALYSIS, ROUTINE W REFLEX MICROSCOPIC
Glucose, UA: NEGATIVE mg/dL
Hgb urine dipstick: NEGATIVE
Ketones, ur: 5 mg/dL — AB
Leukocytes,Ua: NEGATIVE
Nitrite: NEGATIVE
Protein, ur: 30 mg/dL — AB
Specific Gravity, Urine: 1.026 (ref 1.005–1.030)
pH: 5 (ref 5.0–8.0)

## 2022-10-29 LAB — CBC
HCT: 43 % (ref 36.0–46.0)
Hemoglobin: 13.7 g/dL (ref 12.0–15.0)
MCH: 28.8 pg (ref 26.0–34.0)
MCHC: 31.9 g/dL (ref 30.0–36.0)
MCV: 90.5 fL (ref 80.0–100.0)
Platelets: 304 10*3/uL (ref 150–400)
RBC: 4.75 MIL/uL (ref 3.87–5.11)
RDW: 13.7 % (ref 11.5–15.5)
WBC: 7.5 10*3/uL (ref 4.0–10.5)
nRBC: 0 % (ref 0.0–0.2)

## 2022-10-29 LAB — CBG MONITORING, ED: Glucose-Capillary: 113 mg/dL — ABNORMAL HIGH (ref 70–99)

## 2022-10-29 NOTE — ED Triage Notes (Signed)
POV/ was at work and started feeling dizzy, nauseous, and "vibrating feeling in body"/ pt also c/o severe heartburn/

## 2022-10-30 LAB — BASIC METABOLIC PANEL
Anion gap: 11 (ref 5–15)
BUN: 15 mg/dL (ref 6–20)
CO2: 27 mmol/L (ref 22–32)
Calcium: 9.2 mg/dL (ref 8.9–10.3)
Chloride: 100 mmol/L (ref 98–111)
Creatinine, Ser: 1.12 mg/dL — ABNORMAL HIGH (ref 0.44–1.00)
GFR, Estimated: >60 mL/min (ref >60)
Glucose, Bld: 117 mg/dL — ABNORMAL HIGH (ref 70–99)
Potassium: 3.7 mmol/L (ref 3.5–5.1)
Sodium: 138 mmol/L (ref 135–145)

## 2022-11-03 ENCOUNTER — Encounter: Payer: Self-pay | Admitting: Family Medicine

## 2022-11-04 ENCOUNTER — Other Ambulatory Visit: Payer: Self-pay

## 2022-11-04 ENCOUNTER — Other Ambulatory Visit (HOSPITAL_COMMUNITY): Payer: Self-pay

## 2022-11-05 ENCOUNTER — Other Ambulatory Visit: Payer: Self-pay

## 2022-11-08 ENCOUNTER — Other Ambulatory Visit: Payer: Self-pay

## 2022-11-08 ENCOUNTER — Emergency Department (HOSPITAL_COMMUNITY): Payer: 59

## 2022-11-08 ENCOUNTER — Emergency Department (HOSPITAL_COMMUNITY)
Admission: EM | Admit: 2022-11-08 | Discharge: 2022-11-08 | Disposition: A | Discharge status: Home / Self Care | Payer: 59 | Attending: Student | Admitting: Student

## 2022-11-08 ENCOUNTER — Encounter (HOSPITAL_COMMUNITY): Payer: Self-pay

## 2022-11-08 DIAGNOSIS — J45909 Unspecified asthma, uncomplicated: Secondary | ICD-10-CM | POA: Diagnosis not present

## 2022-11-08 DIAGNOSIS — Z79899 Other long term (current) drug therapy: Secondary | ICD-10-CM | POA: Insufficient documentation

## 2022-11-08 DIAGNOSIS — I10 Essential (primary) hypertension: Secondary | ICD-10-CM | POA: Insufficient documentation

## 2022-11-08 DIAGNOSIS — F17210 Nicotine dependence, cigarettes, uncomplicated: Secondary | ICD-10-CM | POA: Diagnosis not present

## 2022-11-08 DIAGNOSIS — R1032 Left lower quadrant pain: Secondary | ICD-10-CM | POA: Diagnosis not present

## 2022-11-08 DIAGNOSIS — K5792 Diverticulitis of intestine, part unspecified, without perforation or abscess without bleeding: Secondary | ICD-10-CM | POA: Insufficient documentation

## 2022-11-08 LAB — URINALYSIS, ROUTINE W REFLEX MICROSCOPIC
Bilirubin Urine: NEGATIVE
Glucose, UA: NEGATIVE mg/dL
Hgb urine dipstick: NEGATIVE
Ketones, ur: NEGATIVE mg/dL
Leukocytes,Ua: NEGATIVE
Nitrite: NEGATIVE
Protein, ur: NEGATIVE mg/dL
Specific Gravity, Urine: 1.012 (ref 1.005–1.030)
pH: 5 (ref 5.0–8.0)

## 2022-11-08 LAB — COMPREHENSIVE METABOLIC PANEL
ALT: 31 U/L (ref 0–44)
AST: 19 U/L (ref 15–41)
Albumin: 3.9 g/dL (ref 3.5–5.0)
Alkaline Phosphatase: 59 U/L (ref 38–126)
Anion gap: 10 (ref 5–15)
BUN: 14 mg/dL (ref 6–20)
CO2: 28 mmol/L (ref 22–32)
Calcium: 9.2 mg/dL (ref 8.9–10.3)
Chloride: 101 mmol/L (ref 98–111)
Creatinine, Ser: 0.82 mg/dL (ref 0.44–1.00)
GFR, Estimated: >60 mL/min (ref >60)
Glucose, Bld: 107 mg/dL — ABNORMAL HIGH (ref 70–99)
Potassium: 3.8 mmol/L (ref 3.5–5.1)
Sodium: 139 mmol/L (ref 135–145)
Total Bilirubin: 0.8 mg/dL (ref 0.3–1.2)
Total Protein: 7.2 g/dL (ref 6.5–8.1)

## 2022-11-08 LAB — CBC
HCT: 39.3 % (ref 36.0–46.0)
Hemoglobin: 12.9 g/dL (ref 12.0–15.0)
MCH: 29.3 pg (ref 26.0–34.0)
MCHC: 32.8 g/dL (ref 30.0–36.0)
MCV: 89.3 fL (ref 80.0–100.0)
Platelets: 335 10*3/uL (ref 150–400)
RBC: 4.4 MIL/uL (ref 3.87–5.11)
RDW: 13.8 % (ref 11.5–15.5)
WBC: 9.8 10*3/uL (ref 4.0–10.5)
nRBC: 0 % (ref 0.0–0.2)

## 2022-11-08 LAB — LIPASE, BLOOD: Lipase: 24 U/L (ref 11–51)

## 2022-11-08 MED ORDER — OXYCODONE-ACETAMINOPHEN 5-325 MG PO TABS: 1.0000 | ORAL_TABLET | Freq: Four times a day (QID) | ORAL | 0 refills | Status: DC | PRN

## 2022-11-08 MED ORDER — MORPHINE SULFATE (PF) 4 MG/ML IV SOLN
4.0000 mg | Freq: Once | INTRAVENOUS | Status: AC
  Administered 2022-11-08: 4 mg via INTRAVENOUS
  Filled 2022-11-08: qty 1

## 2022-11-08 MED ORDER — ONDANSETRON HCL 4 MG/2ML IJ SOLN
4.0000 mg | Freq: Once | INTRAMUSCULAR | Status: AC
  Administered 2022-11-08: 4 mg via INTRAVENOUS
  Filled 2022-11-08: qty 2

## 2022-11-08 MED ORDER — AMOXICILLIN-POT CLAVULANATE 875-125 MG PO TABS
1.0000 | ORAL_TABLET | Freq: Once | ORAL | Status: AC
  Administered 2022-11-08: 1 via ORAL
  Filled 2022-11-08: qty 1

## 2022-11-08 MED ORDER — IOHEXOL 350 MG/ML SOLN
75.0000 mL | Freq: Once | INTRAVENOUS | Status: AC | PRN
  Administered 2022-11-08: 75 mL via INTRAVENOUS

## 2022-11-08 MED ORDER — AMOXICILLIN-POT CLAVULANATE 875-125 MG PO TABS: 1.0000 | ORAL_TABLET | Freq: Two times a day (BID) | ORAL | 0 refills | Status: DC

## 2022-11-08 NOTE — ED Provider Notes (Signed)
[place] EMERGENCY DEPARTMENT AT [facility] Provider Note  CSN: [id number] Arrival date & time: 11/08/22 8469  Chief Complaint(s) Abdominal Pain  HPI Anne Schaefer is a 52 y.o. female with PMH iron deficiency anemia, diverticulitis, HTN who presents emergency department for evaluation of left lower quadrant abdominal pain.  Patient endorsing persistent left lower quadrant abdominal pain with associated nausea but no vomiting.  States that this feels similar to her previous episodes of diverticulitis and she has not had a flare in over 2 years.  Denies associated chest pain, shortness of breath, headache, fever or other systemic symptoms.   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 Authorizing Provider  acetaminophen (TYLENOL) 500 MG tablet Take 1,000 mg by mouth every 6 (six) hours as needed for moderate pain.    [provider]  albuterol (PROVENTIL) (2.5 MG/3ML) 0.083% nebulizer solution Take 3 mLs (2.5 mg total) by nebulization every 6 (six) hours as needed for wheezing or shortness of  breath. 06/08/22   Storm Frisk, MD  albuterol (VENTOLIN HFA) 108 (90 Base) MCG/ACT inhaler Inhale 2 puffs into the lungs every 6 (six) hours as needed for wheezing or shortness of breath. 08/12/22   Storm Frisk, MD  amLODipine (NORVASC) 10 MG tablet Take 1 tablet (10 mg total) by mouth daily. 08/12/22   Storm Frisk, MD  budesonide-formoterol [facility]) 160-4.5 MCG/ACT inhaler Inhale 2 puffs into the lungs 2 (two) times daily. 08/12/22   Storm Frisk, MD  budesonide-formoterol [facility]) 160-4.5 MCG/ACT inhaler Inhale 2 puffs into the lungs 2 (two) times daily.    [provider]  losartan (COZAAR) 100 MG tablet Take 1 tablet (100 mg total) by mouth daily. 08/12/22   Storm Frisk, MD  metroNIDAZOLE (FLAGYL) 500 MG tablet Take 1 tablet (500 mg total) by mouth 2 (two) times daily. Patient not taking: Reported on 10/06/2022 09/23/22   Anders Simmonds, PA-C  Past Surgical History Past Surgical History:  Procedure Laterality Date   ABDOMINAL HYSTERECTOMY N/A 12/31/2014   Procedure: HYSTERECTOMY ABDOMINAL;  Surgeon: Adam Phenix, MD;  Location: [facility];  Service: Gynecology;  Laterality: N/A;   CESAREAN SECTION     FOREHEAD RECONSTRUCTION     as a child   SALPINGOOPHORECTOMY Right 12/31/2014   Procedure: RIGHT SALPINGO OOPHORECTOMY;  Surgeon: Adam Phenix, MD;  Location: [facility];  Service: Gynecology;  Laterality: Right;   TUBAL LIGATION     WISDOM TOOTH EXTRACTION     Family History Family History  Problem Relation Age of Onset   Diabetes Mother    Heart disease Mother        chf   Hypertension Father    Colon cancer Neg Hx    Colon polyps Neg Hx    Esophageal cancer Neg Hx    Prostate cancer Neg Hx    Rectal cancer Neg Hx     Social History Social History   Tobacco Use   Smoking status: Every Day    Packs/day: 0.25     Years: 8.00    Additional pack years: 0.00    Total pack years: 2.00    Types: Cigarettes   Smokeless tobacco: Never   Tobacco comments:    Smoke 2 packs during week ; 5 cigs a day  Vaping Use   Vaping Use: Never used  Substance Use Topics   Alcohol use: Yes    Comment: occ   Drug use: Not Currently    Types: Marijuana    Comment: occasionally   Allergies Ibuprofen, Azithromycin, and Other  Review of Systems Review of Systems  Gastrointestinal:  Positive for abdominal pain and nausea.    Physical Exam Vital Signs  I have reviewed the triage vital signs BP 130/78 (BP Location: Left Arm)   Pulse 84   Temp 97.9 F (36.6 C) (Oral)   Resp 20   Ht 5\' 3"  (1.6 m)   Wt 121.1 kg   LMP 12/05/2014   SpO2 99%   BMI 47.30 kg/m   Physical Exam Vitals and nursing note reviewed.  Constitutional:      General: She is not in acute distress.    Appearance: She is well-developed.  HENT:     Head: Normocephalic and atraumatic.  Eyes:     Conjunctiva/sclera: Conjunctivae normal.  Cardiovascular:     Rate and Rhythm: Normal rate and regular rhythm.     Heart sounds: No murmur heard. Pulmonary:     Effort: Pulmonary effort is normal. No respiratory distress.     Breath sounds: Normal breath sounds.  Abdominal:     Palpations: Abdomen is soft.     Tenderness: There is abdominal tenderness in the left lower quadrant.  Musculoskeletal:        General: No swelling.     Cervical back: Neck supple.  Skin:    General: Skin is warm and dry.     Capillary Refill: Capillary refill takes less than 2 seconds.  Neurological:     Mental Status: She is alert.  Psychiatric:        Mood and Affect: Mood normal.     ED Results and Treatments Labs (all labs ordered are listed, but only abnormal results are displayed) Labs Reviewed  COMPREHENSIVE METABOLIC PANEL - Abnormal; Notable for the following components:      Result Value   Glucose, Bld 107 (*)    All other components within  normal limits  LIPASE, BLOOD  CBC  URINALYSIS, ROUTINE W REFLEX MICROSCOPIC                                                                                                                          Radiology No results found.  Pertinent labs & imaging results that were available during my care of the patient were reviewed by me and considered in my medical decision making (see MDM for details).  Medications Ordered in ED Medications  morphine (PF) 4 MG/ML injection 4 mg (has no administration in time range)  ondansetron (ZOFRAN) injection 4 mg (has no administration in time range)                                                                                                                                     Procedures Procedures  (including critical care time)  Medical Decision Making / ED Course   This patient presents to the ED for concern of abdominal pain, this involves an extensive number of treatment options, and is a complaint that carries with it a high risk of complications and morbidity.  The differential diagnosis includes diverticulitis, epiploic appendagitis, colitis, gastroenteritis, constipation, nephrolithiasis, inflammatory bowel disease,   MDM: Patient seen emergency room for evaluation of abdominal pain.  Physical exam with tenderness in the left lower quadrant but is otherwise unremarkable.  Laboratory evaluation unremarkable.  CT abdomen pelvis concerning for acute uncomplicated diverticulitis with no evidence of perforation or abscess.  Patient pain controlled and started on Augmentin.  On reevaluation, symptoms improved and patient will be discharged with pain control and Augmentin with outpatient follow-up.  Return precautions given and patient discharged.   Additional history obtained:  -External records from outside source obtained and reviewed including: Chart review including previous notes, labs, imaging, consultation notes   Lab Tests: -I ordered,  reviewed, and interpreted labs.   The pertinent results include:   Labs Reviewed  COMPREHENSIVE METABOLIC PANEL - Abnormal; Notable for the following components:      Result Value   Glucose, Bld 107 (*)    All other components within normal limits  LIPASE, BLOOD  CBC  URINALYSIS, ROUTINE W REFLEX MICROSCOPIC      Imaging Studies ordered: I ordered imaging studies including CTAP I independently visualized and interpreted imaging. I agree with the radiologist interpretation   Medicines ordered and prescription drug management: Meds ordered this encounter  Medications   morphine (PF) 4 MG/ML injection 4 mg   ondansetron (ZOFRAN) injection 4 mg    -I have reviewed the patients home medicines and have made adjustments as needed  Critical interventions none  Cardiac Monitoring: The patient was maintained on a cardiac monitor.  I personally viewed and interpreted the cardiac monitored which showed an underlying rhythm of: NSR  Social Determinants of Health:  Factors impacting patients care include: none   Reevaluation: After the interventions noted above, I reevaluated the patient and found that they have :improved  Co morbidities that complicate the patient evaluation  Past Medical History:  Diagnosis Date   Allergy    Anemia    on Iron    Asthma    Diverticulitis    Headache    migraine in 2015   Hematoma 01/10/2015   History of blood transfusion    Hypertension    Infection    trich   Obesity    Ovarian cyst    Pneumonia    had 2 times 1997 and 2012   Rectus sheath hematoma s/p hysterectomy 12/31/14 01/12/2015   Seasonal allergies       Dispostion: I considered admission for this patient, but at this time patient does not meet inpatient criteria for admission she is safe for discharge with outpatient follow-up on antibiotics     Final Clinical Impression(s) / ED Diagnoses Final diagnoses:  None     @PCDICTATION @    Glendora Score,  MD 11/08/22 1907

## 2022-11-08 NOTE — ED Triage Notes (Signed)
Pt arrived POV from home c/o left sided abdominal pain. Pt states she has a hx of diverticulitis and this pain is similar. Pt is nauseous but has not vomited.

## 2022-11-26 ENCOUNTER — Other Ambulatory Visit: Payer: Self-pay

## 2022-11-26 ENCOUNTER — Emergency Department (HOSPITAL_COMMUNITY): Payer: 59

## 2022-11-26 ENCOUNTER — Emergency Department (HOSPITAL_COMMUNITY)
Admission: EM | Admit: 2022-11-26 | Discharge: 2022-11-26 | Disposition: A | Discharge status: Home / Self Care | Payer: 59 | Attending: Emergency Medicine | Admitting: Emergency Medicine

## 2022-11-26 DIAGNOSIS — R1032 Left lower quadrant pain: Secondary | ICD-10-CM | POA: Diagnosis present

## 2022-11-26 DIAGNOSIS — K5792 Diverticulitis of intestine, part unspecified, without perforation or abscess without bleeding: Secondary | ICD-10-CM

## 2022-11-26 DIAGNOSIS — R109 Unspecified abdominal pain: Secondary | ICD-10-CM | POA: Diagnosis not present

## 2022-11-26 DIAGNOSIS — I700 Atherosclerosis of aorta: Secondary | ICD-10-CM | POA: Diagnosis not present

## 2022-11-26 DIAGNOSIS — K760 Fatty (change of) liver, not elsewhere classified: Secondary | ICD-10-CM | POA: Diagnosis not present

## 2022-11-26 DIAGNOSIS — K5732 Diverticulitis of large intestine without perforation or abscess without bleeding: Secondary | ICD-10-CM | POA: Diagnosis not present

## 2022-11-26 LAB — URINALYSIS, W/ REFLEX TO CULTURE (INFECTION SUSPECTED)
Glucose, UA: NEGATIVE mg/dL
Hgb urine dipstick: NEGATIVE
Ketones, ur: NEGATIVE mg/dL
Leukocytes,Ua: NEGATIVE
Nitrite: NEGATIVE
Protein, ur: 30 mg/dL — AB
Specific Gravity, Urine: 1.036 — ABNORMAL HIGH (ref 1.005–1.030)
pH: 5 (ref 5.0–8.0)

## 2022-11-26 LAB — CBC WITH DIFFERENTIAL/PLATELET
Abs Immature Granulocytes: 0.03 10*3/uL (ref 0.00–0.07)
Basophils Absolute: 0 10*3/uL (ref 0.0–0.1)
Basophils Relative: 0 %
Eosinophils Absolute: 0.2 10*3/uL (ref 0.0–0.5)
Eosinophils Relative: 2 %
HCT: 39.9 % (ref 36.0–46.0)
Hemoglobin: 12.7 g/dL (ref 12.0–15.0)
Immature Granulocytes: 0 %
Lymphocytes Relative: 23 %
Lymphs Abs: 2.4 10*3/uL (ref 0.7–4.0)
MCH: 28.7 pg (ref 26.0–34.0)
MCHC: 31.8 g/dL (ref 30.0–36.0)
MCV: 90.3 fL (ref 80.0–100.0)
Monocytes Absolute: 0.8 10*3/uL (ref 0.1–1.0)
Monocytes Relative: 7 %
Neutro Abs: 7.2 10*3/uL (ref 1.7–7.7)
Neutrophils Relative %: 68 %
Platelets: 319 10*3/uL (ref 150–400)
RBC: 4.42 MIL/uL (ref 3.87–5.11)
RDW: 13.7 % (ref 11.5–15.5)
WBC: 10.7 10*3/uL — ABNORMAL HIGH (ref 4.0–10.5)
nRBC: 0 % (ref 0.0–0.2)

## 2022-11-26 LAB — BASIC METABOLIC PANEL
Anion gap: 10 (ref 5–15)
BUN: 9 mg/dL (ref 6–20)
CO2: 27 mmol/L (ref 22–32)
Calcium: 9.4 mg/dL (ref 8.9–10.3)
Chloride: 101 mmol/L (ref 98–111)
Creatinine, Ser: 0.92 mg/dL (ref 0.44–1.00)
GFR, Estimated: >60 mL/min (ref >60)
Glucose, Bld: 125 mg/dL — ABNORMAL HIGH (ref 70–99)
Potassium: 3.8 mmol/L (ref 3.5–5.1)
Sodium: 138 mmol/L (ref 135–145)

## 2022-11-26 MED ORDER — OXYCODONE-ACETAMINOPHEN 5-325 MG PO TABS
1.0000 | ORAL_TABLET | Freq: Four times a day (QID) | ORAL | 0 refills | Status: DC | PRN
  Filled 2022-11-26: qty 10, 3d supply, fill #0

## 2022-11-26 MED ORDER — METRONIDAZOLE 500 MG PO TABS
500.0000 mg | ORAL_TABLET | Freq: Two times a day (BID) | ORAL | 0 refills | Status: DC
  Filled 2022-11-26: qty 14, 7d supply, fill #0

## 2022-11-26 MED ORDER — CIPROFLOXACIN HCL 500 MG PO TABS
500.0000 mg | ORAL_TABLET | Freq: Two times a day (BID) | ORAL | 0 refills | Status: DC
  Filled 2022-11-26: qty 14, 7d supply, fill #0

## 2022-11-26 MED ORDER — IOHEXOL 350 MG/ML SOLN
75.0000 mL | Freq: Once | INTRAVENOUS | Status: AC | PRN
  Administered 2022-11-26: 75 mL via INTRAVENOUS

## 2022-11-26 MED ORDER — ONDANSETRON HCL 4 MG/2ML IJ SOLN
4.0000 mg | Freq: Once | INTRAMUSCULAR | Status: AC
  Administered 2022-11-26: 4 mg via INTRAVENOUS
  Filled 2022-11-26: qty 2

## 2022-11-26 MED ORDER — OXYCODONE-ACETAMINOPHEN 5-325 MG PO TABS
1.0000 | ORAL_TABLET | Freq: Once | ORAL | Status: AC
  Administered 2022-11-26: 1 via ORAL
  Filled 2022-11-26: qty 1

## 2022-11-26 MED ORDER — MORPHINE SULFATE (PF) 4 MG/ML IV SOLN
4.0000 mg | Freq: Once | INTRAVENOUS | Status: AC
  Administered 2022-11-26: 4 mg via INTRAVENOUS
  Filled 2022-11-26: qty 1

## 2022-11-26 MED ORDER — SODIUM CHLORIDE 0.9 % IV BOLUS
1000.0000 mL | Freq: Once | INTRAVENOUS | Status: AC
  Administered 2022-11-26: 1000 mL via INTRAVENOUS

## 2022-11-26 NOTE — ED Triage Notes (Signed)
Pt with hx diverticulitis here for eval of lower abdominal pain, nausea without vomiting, and jelly-like stool since last night.

## 2022-11-26 NOTE — ED Notes (Signed)
Urine sent to lab 

## 2022-11-26 NOTE — ED Provider Notes (Signed)
[place] EMERGENCY DEPARTMENT AT [facility] Provider Note   CSN: [id number] Arrival date & time: 11/26/22  0845     History  Chief Complaint  Patient presents with   Abdominal Pain    Anne Schaefer is a 52 y.o. female.  She is here with complaint of lower abdominal pain that started last evening.  She was here 2 weeks ago for same and was diagnosed with diverticulitis treated with Augmentin.  She said she had complete resolution of her symptoms.  She started with gas pains last night that worsened to more severe pain this morning.  No fevers or chills.  She said her stool felt a little softer but no blood.  She is also having a little bit of urinary discomfort.  She has had a hysterectomy.  No headache cough sore throat nausea vomiting.  The history is provided by the patient.  Abdominal Pain Pain location:  LLQ and suprapubic Pain quality: aching and cramping   Pain severity:  Severe Onset quality:  Gradual Duration:  2 days Timing:  Constant Progression:  Worsening Chronicity:  Recurrent Context: not trauma   Relieved by:  None tried Worsened by:  Movement Ineffective treatments:  None tried Associated symptoms: dysuria   Associated symptoms: no anorexia, no chest pain, no constipation, no cough, no diarrhea, no fever, no hematemesis, no hematochezia, no hematuria, no nausea, no shortness of breath, no vaginal bleeding, no vaginal discharge and no vomiting        Home Medications Prior to Admission medications   Medication Sig Start Date End Date Taking? Authorizing Provider  albuterol (PROVENTIL) (2.5 MG/3ML) 0.083% nebulizer solution Take 3 mLs (2.5 mg total) by nebulization every 6 (six) hours as needed for wheezing or shortness of breath. 06/08/22   Storm Frisk, MD  albuterol (VENTOLIN HFA) 108 (90 Base) MCG/ACT inhaler Inhale 2 puffs into the lungs every 6 (six) hours as needed for wheezing or shortness of breath. 08/12/22   Storm Frisk, MD   amLODipine (NORVASC) 10 MG tablet Take 1 tablet (10 mg total) by mouth daily. 08/12/22   Storm Frisk, MD  amoxicillin-clavulanate (AUGMENTIN) 875-125 MG tablet Take 1 tablet by mouth every 12 (twelve) hours. 11/08/22   Kommor, [place], MD  budesonide-formoterol [facility]) 160-4.5 MCG/ACT inhaler Inhale 2 puffs into the lungs 2 (two) times daily. 08/12/22   Storm Frisk, MD  budesonide-formoterol [facility]) 160-4.5 MCG/ACT inhaler Inhale 2 puffs into the lungs 2 (two) times daily.    [provider]  losartan (COZAAR) 100 MG tablet Take 1 tablet (100 mg total) by mouth daily. 08/12/22   Storm Frisk, MD  metroNIDAZOLE (FLAGYL) 500 MG tablet Take 1 tablet (500 mg total) by mouth 2 (two) times daily. Patient not taking: Reported on 10/06/2022 09/23/22   Anders Simmonds, PA-C  oxyCODONE-acetaminophen (PERCOCET/ROXICET) 5-325 MG tablet Take 1 tablet by mouth every 6 (six) hours as needed for severe pain. 11/08/22   Kommor, Wyn Forster, MD      Allergies    Ibuprofen, Azithromycin, and Other    Review of Systems   Review of Systems  Constitutional:  Negative for fever.  Respiratory:  Negative for cough and shortness of breath.   Cardiovascular:  Negative for chest pain.  Gastrointestinal:  Positive for abdominal pain. Negative for anorexia, constipation, diarrhea, hematemesis, hematochezia, nausea and vomiting.  Genitourinary:  Positive for dysuria. Negative for hematuria, vaginal bleeding and vaginal discharge.    Physical Exam Updated Vital Signs  BP (!) 134/95   Pulse 95   Temp 98.5 F (36.9 C) (Oral)   Resp 11   Ht 5\' 3"  (1.6 m)   Wt 121.1 kg   LMP 12/05/2014   SpO2 99%   BMI 47.30 kg/m  Physical Exam Vitals and nursing note reviewed.  Constitutional:      General: She is not in acute distress.    Appearance: Normal appearance. She is well-developed.  HENT:     Head: Normocephalic and atraumatic.  Eyes:     Conjunctiva/sclera: Conjunctivae normal.   Cardiovascular:     Rate and Rhythm: Normal rate and regular rhythm.     Heart sounds: No murmur heard. Pulmonary:     Effort: Pulmonary effort is normal. No respiratory distress.     Breath sounds: Normal breath sounds.  Abdominal:     Palpations: Abdomen is soft.     Tenderness: There is abdominal tenderness in the right lower quadrant, suprapubic area and left lower quadrant. There is no guarding or rebound.  Musculoskeletal:        General: No swelling.     Cervical back: Neck supple.  Skin:    General: Skin is warm and dry.     Capillary Refill: Capillary refill takes less than 2 seconds.  Neurological:     General: No focal deficit present.     Mental Status: She is alert.     ED Results / Procedures / Treatments   Labs (all labs ordered are listed, but only abnormal results are displayed) Labs Reviewed  BASIC METABOLIC PANEL - Abnormal; Notable for the following components:      Result Value   Glucose, Bld 125 (*)    All other components within normal limits  CBC WITH DIFFERENTIAL/PLATELET - Abnormal; Notable for the following components:   WBC 10.7 (*)    All other components within normal limits  URINALYSIS, W/ REFLEX TO CULTURE (INFECTION SUSPECTED) - Abnormal; Notable for the following components:   Color, Urine AMBER (*)    APPearance TURBID (*)    Specific Gravity, Urine 1.036 (*)    Bilirubin Urine SMALL (*)    Protein, ur 30 (*)    Bacteria, UA RARE (*)    All other components within normal limits    EKG None  Radiology CT ABDOMEN PELVIS W CONTRAST  Result Date: 11/26/2022 CLINICAL DATA:  Abdominal pain. Diverticulitis, complications suspected. EXAM: CT ABDOMEN AND PELVIS WITH CONTRAST TECHNIQUE: Multidetector CT imaging of the abdomen and pelvis was performed using the standard protocol following bolus administration of intravenous contrast. RADIATION DOSE REDUCTION: This exam was performed according to the departmental dose-optimization program which  includes automated exposure control, adjustment of the mA and/or kV according to patient size and/or use of iterative reconstruction technique. CONTRAST:  75mL OMNIPAQUE IOHEXOL 350 MG/ML SOLN COMPARISON:  Abdominopelvic CT 11/08/2022 and 12/24/2015. FINDINGS: The contrast bolus is suboptimal. Lower chest: Stable chronic scarring anteriorly in both lung bases. No significant pleural or pericardial effusion. Hepatobiliary: Subjective mild hepatic steatosis. No focal abnormalities are identified. No evidence of gallstones, gallbladder wall thickening or biliary dilatation. Pancreas: Unremarkable. No pancreatic ductal dilatation or surrounding inflammatory changes. Spleen: Normal in size without focal abnormality. Adrenals/Urinary Tract: Both adrenal glands appear normal. No evidence of urinary tract calculus, suspicious renal lesion or hydronephrosis. The bladder appears unremarkable for its degree of distention. Stomach/Bowel: No enteric contrast administered. The stomach appears unremarkable for its degree of distention. The small bowel, appendix and proximal colon appear unremarkable.  There are persistent changes of long segment diverticulitis involving the mid to distal sigmoid colon. There is persistent wall thickening with interval increased surrounding inflammation and ill-defined fluid. No drainable fluid collection or pneumoperitoneum. Vascular/Lymphatic: There are several prominent lower retroperitoneal lymph nodes which are similar to the most recent study and likely reactive. These include a 6 mm aortocaval node on image 45/3, and 8 mm left periaortic node on image 43/3 and an 8 mm node anterior to the iliac bifurcation on image 54/3. Mild aortic atherosclerosis without evidence of aneurysm. There is no evidence of large vessel occlusion, although intravascular contrast is limited. Reproductive: Status post hysterectomy. Nonspecific gas in the cervix. No adnexal mass. Other: Trace pelvic ascites. Increased  pelvic inflammatory changes surrounding the sigmoid colon, as described above. No focal extraluminal fluid collection or pneumoperitoneum. Musculoskeletal: No acute or significant osseous findings. IMPRESSION: 1. Persistent changes of acute sigmoid diverticulitis with increased surrounding inflammation and ill-defined fluid. No drainable fluid collection or pneumoperitoneum. 2. No evidence of bowel obstruction. The appendix appears normal. 3. Mildly prominent lower retroperitoneal lymph nodes are likely reactive. 4.  Aortic Atherosclerosis (ICD10-I70.0). Electronically Signed   By: Carey Bullocks M.D.   On: 11/26/2022 13:00    Procedures Procedures    Medications Ordered in ED Medications  sodium chloride 0.9 % bolus 1,000 mL (has no administration in time range)  ondansetron (ZOFRAN) injection 4 mg (has no administration in time range)  morphine (PF) 4 MG/ML injection 4 mg (has no administration in time range)    ED Course/ Medical Decision Making/ A&P Clinical Course as of 11/26/22 1753  Fri Nov 26, 2022  1309 Patient CT showing persistent inflammatory changes left lower quadrant consistent with acute diverticulitis without drainable abscess.  I reviewed this with the patient.  She said she cannot be admitted to the hospital and would like to try another course of oral antibiotics.  She said they had to treat her with Cipro and Flagyl when she was in [place] so we will try that.  She understands she needs follow-up with general surgery for possible more definitive care. [MB]    Clinical Course User Index [MB] Terrilee Files, MD                             Medical Decision Making Amount and/or Complexity of Data Reviewed Labs: ordered. Radiology: ordered.  Risk Prescription drug management.   This patient complains of lower abdominal pain nausea vomiting; this involves an extensive number of treatment Options and is a complaint that carries with it a high risk of complications  and morbidity. The differential includes diverticulitis, colitis, obstruction, pyelonephritis, renal colic  I ordered, reviewed and interpreted labs, which included CBC with mildly elevated white count stable hemoglobin, chemistries unremarkable, urinalysis possible signs of infection I ordered medication IV fluids pain medication nausea medication and reviewed PMP when indicated. I ordered imaging studies which included CT abdomen and pelvis and I independently    visualized and interpreted imaging which showed inflammatory changes sigmoid colon consistent with diverticulitis no definite abscess Previous records obtained and reviewed in epic including prior ED visit Cardiac monitoring reviewed, normal sinus rhythm Social determinants considered, tobacco use Critical Interventions: None  After the interventions stated above, I reevaluated the patient and found her pain to be improved and her abdomen benign Admission and further testing considered, she was offered admission to the hospital and she declines.  Will cover with  antibiotics and given contact information for general surgery outpatient.  Return instructions discussed         Final Clinical Impression(s) / ED Diagnoses Final diagnoses:  Acute diverticulitis    Rx / DC Orders ED Discharge Orders          Ordered    ciprofloxacin (CIPRO) 500 MG tablet  2 times daily        11/26/22 1311    metroNIDAZOLE (FLAGYL) 500 MG tablet  2 times daily        11/26/22 1311    oxyCODONE-acetaminophen (PERCOCET/ROXICET) 5-325 MG tablet  Every 6 hours PRN        11/26/22 1311              Terrilee Files, MD 11/26/22 1755

## 2022-11-26 NOTE — ED Notes (Signed)
Pt refused PIV, requested Ultrasound guided IV

## 2022-11-26 NOTE — ED Notes (Signed)
Urine collected and at bedside.

## 2022-11-26 NOTE — ED Notes (Signed)
Pt ambulatory to the bathroom after CT scan, then placed back on the monitor

## 2022-11-26 NOTE — Discharge Instructions (Addendum)
You were seen in the emergency department for worsening lower abdominal pain.  Your CAT scan showed persistent diverticulitis.  We discussed admission but you wanted to try a second course of antibiotics.  Please finish the complete course of medications.  Clear liquid diet advance as tolerated.  Follow-up with your primary care doctor and also schedule appointment with the general surgery clinic.  Return to the emergency department if any worsening or concerning symptoms

## 2022-12-03 ENCOUNTER — Other Ambulatory Visit: Payer: Self-pay

## 2023-01-12 ENCOUNTER — Other Ambulatory Visit: Payer: Self-pay

## 2023-02-10 ENCOUNTER — Other Ambulatory Visit: Payer: Self-pay | Admitting: Critical Care Medicine

## 2023-02-10 DIAGNOSIS — J4520 Mild intermittent asthma, uncomplicated: Secondary | ICD-10-CM

## 2023-02-11 ENCOUNTER — Other Ambulatory Visit: Payer: Self-pay

## 2023-02-11 MED ORDER — BUDESONIDE-FORMOTEROL FUMARATE 160-4.5 MCG/ACT IN AERO
2.0000 | INHALATION_SPRAY | Freq: Two times a day (BID) | RESPIRATORY_TRACT | 0 refills | Status: DC
  Filled 2023-02-11: qty 10.2, 30d supply, fill #0

## 2023-02-11 MED ORDER — ALBUTEROL SULFATE HFA 108 (90 BASE) MCG/ACT IN AERS
2.0000 | INHALATION_SPRAY | Freq: Four times a day (QID) | RESPIRATORY_TRACT | 0 refills | Status: DC | PRN
Start: 2023-02-11 — End: 2023-03-25
  Filled 2023-02-11: qty 6.7, 25d supply, fill #0

## 2023-02-15 ENCOUNTER — Other Ambulatory Visit: Payer: Self-pay

## 2023-03-04 ENCOUNTER — Encounter (HOSPITAL_COMMUNITY): Payer: Self-pay | Admitting: *Deleted

## 2023-03-04 ENCOUNTER — Ambulatory Visit (HOSPITAL_COMMUNITY)
Admission: EM | Admit: 2023-03-04 | Discharge: 2023-03-04 | Disposition: A | Discharge status: Home / Self Care | Payer: 59 | Attending: Internal Medicine | Admitting: Internal Medicine

## 2023-03-04 DIAGNOSIS — L03213 Periorbital cellulitis: Secondary | ICD-10-CM

## 2023-03-04 MED ORDER — AMOXICILLIN-POT CLAVULANATE 875-125 MG PO TABS: 1.0000 | ORAL_TABLET | Freq: Two times a day (BID) | ORAL | 0 refills | Status: DC

## 2023-03-04 NOTE — Discharge Instructions (Signed)
Take antibiotics as prescribed. Can continue with OTC pain medicine and warm compress. Follow-up with eye doctor if minimal improvement by Monday. Go to the ER if develop worsening symptoms such as pain with eye movements, fever, or vision changes.

## 2023-03-04 NOTE — ED Provider Notes (Signed)
[facility]    CSN: [id number] Arrival date & time: 03/04/23  1851      History   Chief Complaint Chief Complaint  Patient presents with   Eye Problem    HPI Anne Schaefer is a 52 y.o. female.   Patient presents with concerns of left eye swelling and pain. She states last night she noticed her left eye was a little watery and seemed a little sore. When she woke up she noticed her left upper eyelid was swollen and a little crusty. She denies ongoing drainage since waking. She states it is sore and swollen. The patient does not wear contacts. She denies headache, fever, or dizziness. She denies notable vision changes. The patient does states she has had some congestion and mild cold symptoms the past few days.   The history is provided by the patient.  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   Lung nodule 09/03/2015   S/P abdominal supracervical hysterectomy and right salpingo-oophorectomy on 12/31/14 01/12/2015   Smoking 03/08/2014   Essential hypertension, benign 03/08/2014   Dermoid cyst of ovary 05/21/2013   Hidradenitis suppurativa 04/09/2012    Past Surgical History:  Procedure Laterality Date   ABDOMINAL HYSTERECTOMY N/A 12/31/2014   Procedure: HYSTERECTOMY ABDOMINAL;  Surgeon: Adam Phenix, MD;  Location: [facility];  Service: Gynecology;   Laterality: N/A;   CESAREAN SECTION     FOREHEAD RECONSTRUCTION     as a child   SALPINGOOPHORECTOMY Right 12/31/2014   Procedure: RIGHT SALPINGO OOPHORECTOMY;  Surgeon: Adam Phenix, MD;  Location: [facility];  Service: Gynecology;  Laterality: Right;   TUBAL LIGATION     WISDOM TOOTH EXTRACTION      OB History     Gravida  5   Para  5   Term  4   Preterm  1   AB  0   Living  5      SAB  0   IAB  0   Ectopic  0   Multiple  0   Live Births  1            Home Medications    Prior to Admission medications   Medication Sig Start Date End Date Taking? Authorizing Provider  amLODipine (NORVASC) 10 MG tablet Take 1 tablet (10 mg total) by mouth daily. 08/12/22  Yes Storm Frisk, MD  amoxicillin-clavulanate (AUGMENTIN) 875-125 MG tablet Take 1 tablet by mouth every 12 (twelve) hours. 03/04/23  Yes Farah Benish L, PA  budesonide-formoterol (SYMBICORT) 160-4.5 MCG/ACT inhaler Inhale 2 puffs into the lungs 2 (two) times daily. 02/11/23  Yes Storm Frisk, MD  losartan (COZAAR) 100 MG tablet Take 1 tablet (100  mg total) by mouth daily. 08/12/22  Yes Storm Frisk, MD  albuterol (PROVENTIL) (2.5 MG/3ML) 0.083% nebulizer solution Take 3 mLs (2.5 mg total) by nebulization every 6 (six) hours as needed for wheezing or shortness of breath. 06/08/22   Storm Frisk, MD  albuterol (VENTOLIN HFA) 108 (90 Base) MCG/ACT inhaler Inhale 2 puffs into the lungs every 6 (six) hours as needed for wheezing or shortness of breath. 02/11/23   Storm Frisk, MD  oxyCODONE-acetaminophen (PERCOCET/ROXICET) 5-325 MG tablet Take 1 tablet by mouth every 6 (six) hours as needed for severe pain. 11/26/22   Terrilee Files, MD    Family History Family History  Problem Relation Age of Onset   Diabetes Mother    Heart disease Mother        chf   Hypertension Father    Colon cancer Neg Hx    Colon polyps Neg Hx    Esophageal cancer Neg Hx    Prostate cancer Neg Hx    Rectal cancer  Neg Hx     Social History Social History   Tobacco Use   Smoking status: Every Day    Current packs/day: 0.25    Average packs/day: 0.3 packs/day for 8.0 years (2.0 ttl pk-yrs)    Types: Cigarettes   Smokeless tobacco: Never   Tobacco comments:    Smoke 2 packs during week ; 5 cigs a day  Vaping Use   Vaping status: Never Used  Substance Use Topics   Alcohol use: Yes    Comment: occ   Drug use: Yes    Types: Marijuana    Comment: occasionally     Allergies   Ibuprofen, Azithromycin, and Other   Review of Systems Review of Systems  Constitutional:  Negative for fatigue and fever.  HENT:  Positive for congestion, postnasal drip and rhinorrhea. Negative for ear pain, sore throat and trouble swallowing.   Eyes:  Positive for pain. Negative for photophobia, discharge and visual disturbance.  Respiratory:  Negative for cough and shortness of breath.   Cardiovascular:  Negative for chest pain.  Gastrointestinal:  Negative for nausea and vomiting.  Skin:  Negative for rash.  Neurological:  Negative for dizziness and headaches.     Physical Exam Triage Vital Signs ED Triage Vitals  Encounter Vitals Group     BP 03/04/23 2004 121/84     Systolic BP Percentile --      Diastolic BP Percentile --      Pulse Rate 03/04/23 2004 72     Resp 03/04/23 2004 18     Temp 03/04/23 2004 98.2 F (36.8 C)     Temp Source 03/04/23 2004 Oral     SpO2 03/04/23 2004 97 %     Weight --      Height --      Head Circumference --      Peak Flow --      Pain Score 03/04/23 2001 8     Pain Loc --      Pain Education --      Exclude from Growth Chart --    No data found.  Updated Vital Signs BP 121/84 (BP Location: Right Arm)   Pulse 72   Temp 98.2 F (36.8 C) (Oral)   Resp 18   LMP 12/05/2014   SpO2 97%   Visual Acuity Right Eye Distance: 20/25 with glasses Left Eye Distance: 20/20 with glasses Bilateral Distance: 20/20 with glasses  Right Eye Near:  Left Eye Near:     Bilateral Near:     Physical Exam Vitals and nursing note reviewed.  Constitutional:      General: She is not in acute distress. HENT:     Head: Normocephalic.     Nose: Congestion present.     Mouth/Throat:     Mouth: Mucous membranes are moist.     Pharynx: Oropharynx is clear.  Eyes:     Extraocular Movements: Extraocular movements intact.     Conjunctiva/sclera: Conjunctivae normal.     Comments: Left upper eyelid with moderate swelling and mild erythema, tender. No purulence. No notable conjunctival injection or drainage. EOMI without pain.  Cardiovascular:     Rate and Rhythm: Normal rate and regular rhythm.     Heart sounds: Normal heart sounds.  Pulmonary:     Effort: Pulmonary effort is normal.     Breath sounds: Normal breath sounds.  Musculoskeletal:     Cervical back: Normal range of motion.  Lymphadenopathy:     Cervical: No cervical adenopathy.  Neurological:     Mental Status: She is alert.  Psychiatric:        Mood and Affect: Mood normal.      UC Treatments / Results  Labs (all labs ordered are listed, but only abnormal results are displayed) Labs Reviewed - No data to display  EKG   Radiology No results found.  Procedures Procedures (including critical care time)  Medications Ordered in UC Medications - No data to display  Initial Impression / Assessment and Plan / UC Course  I have reviewed the triage vital signs and the nursing notes.  Pertinent labs & imaging results that were available during my care of the patient were reviewed by me and considered in my medical decision making (see chart for details).     S/s consistent with preseptal cellulitis. EOMI without pain, no fever, or other signs of orbital cellulitis. Empiric oral abx and discussed close return precautions.   E/M: 1 acute uncomplicated illness, no data, moderate risk due to prescription management  Final Clinical Impressions(s) / UC Diagnoses   Final diagnoses:   Preseptal cellulitis of left upper eyelid     Discharge Instructions      Take antibiotics as prescribed. Can continue with OTC pain medicine and warm compress. Follow-up with eye doctor if minimal improvement by Monday. Go to the ER if develop worsening symptoms such as pain with eye movements, fever, or vision changes.      ED Prescriptions     Medication Sig Dispense Auth. Provider   amoxicillin-clavulanate (AUGMENTIN) 875-125 MG tablet Take 1 tablet by mouth every 12 (twelve) hours. 14 tablet Vallery Sa, Yesica Kemler L, [place]      PDMP not reviewed this encounter.   Estanislado Pandy, [place] 03/04/23 2041

## 2023-03-04 NOTE — ED Triage Notes (Signed)
Pt states that she has left eye pain and when she woke up today it was swollen shut. She states that when she woke up it was crusty.

## 2023-03-09 ENCOUNTER — Other Ambulatory Visit: Payer: Self-pay

## 2023-03-09 ENCOUNTER — Ambulatory Visit: Payer: 59 | Attending: Critical Care Medicine | Admitting: Nurse Practitioner

## 2023-03-09 ENCOUNTER — Encounter: Payer: Self-pay | Admitting: Nurse Practitioner

## 2023-03-09 VITALS — BP 116/77 | HR 89 | Ht 63.0 in | Wt 269.6 lb

## 2023-03-09 DIAGNOSIS — M7918 Myalgia, other site: Secondary | ICD-10-CM | POA: Diagnosis not present

## 2023-03-09 DIAGNOSIS — D72829 Elevated white blood cell count, unspecified: Secondary | ICD-10-CM

## 2023-03-09 DIAGNOSIS — I10 Essential (primary) hypertension: Secondary | ICD-10-CM

## 2023-03-09 MED ORDER — CYCLOBENZAPRINE HCL 10 MG PO TABS
10.0000 mg | ORAL_TABLET | Freq: Three times a day (TID) | ORAL | 1 refills | Status: DC | PRN
  Filled 2023-03-09: qty 60, 20d supply, fill #0
  Filled 2023-09-06: qty 60, 20d supply, fill #1

## 2023-03-09 MED ORDER — LOSARTAN POTASSIUM 100 MG PO TABS
100.0000 mg | ORAL_TABLET | Freq: Every day | ORAL | 1 refills | Status: DC
Start: 2023-03-09 — End: 2023-06-30
  Filled 2023-03-09: qty 90, 90d supply, fill #0
  Filled 2023-06-03: qty 90, 90d supply, fill #1

## 2023-03-09 MED ORDER — AMLODIPINE BESYLATE 10 MG PO TABS
10.0000 mg | ORAL_TABLET | Freq: Every day | ORAL | 1 refills | Status: DC
Start: 2023-03-09 — End: 2023-06-30
  Filled 2023-03-09: qty 90, 90d supply, fill #0
  Filled 2023-06-03: qty 90, 90d supply, fill #1

## 2023-03-09 NOTE — Progress Notes (Signed)
Anne Schaefer was seen today for medical management of chronic issues.  Diagnoses and all orders for this visit:  Primary hypertension -     amLODipine (NORVASC) 10 MG tablet; Take 1 tablet (10 mg total) by mouth daily. -     losartan (COZAAR) 100 MG tablet; Take 1 tablet (100 mg total) by mouth daily. -     CMP14+EGFR Continue all antihypertensives as prescribed.  Reminded to bring in blood pressure log for follow  up appointment.  RECOMMENDATIONS: DASH/Mediterranean Diets are healthier choices for HTN.    Leukocytosis, unspecified type -     CBC with Differential/Platelet  Myalgia, multiple sites -     cyclobenzaprine (FLEXERIL) 10 MG tablet; Take 1 tablet (10 mg total) by mouth 3 (three) times daily as needed for muscle spasms.    Subjective:   Chief Complaint  Patient presents with   Medical Management of Chronic Issues   HPI Anne Schaefer 52 y.o. female presents to office today for follow up to HTN  She has a past medical history of Allergy, Anemia, Asthma, Diverticulitis, Headache, Hematoma (01/10/2015),  Hypertension, Infection, Obesity, Ovarian cyst, Pneumonia, Rectus sheath hematoma s/p hysterectomy 12/31/14 (01/12/2015), and Seasonal allergies.    Currently on day 5 of Augmentin for preseptal cellulitis of left upper eyelid.  Was also recently treated in May with ciprofloxacin and metronidazole for acute diverticulitis.  As of today she has not followed up with general surgery.  She is requesting FMLA papers be filled out.  I have instructed her that at this time she does not meet criteria for FMLA as she has no chronic conditions that require her to take time off from work.   She broke the top of her left foot and right ankle over almost 10 years ago. States she still has trouble sleeping at night due to pain in her legs which feels like cramping. She is requesting a muscle relaxant for this.   HTN She has been out of amlodipine and losartan and is requesting refills  today. Blood pressure is well controlled.  BP Readings from Last 3 Encounters:  03/09/23 116/77  03/04/23 121/84  11/26/22 130/83     Review of Systems  Constitutional:  Negative for fever, malaise/fatigue and weight loss.  HENT: Negative.  Negative for nosebleeds.   Eyes: Negative.  Negative for blurred vision, double vision and photophobia.  Respiratory: Negative.  Negative for cough and shortness of breath.   Cardiovascular: Negative.  Negative for chest pain, palpitations and leg swelling.  Gastrointestinal: Negative.  Negative for heartburn, nausea and vomiting.  Musculoskeletal:  Positive for joint pain and myalgias.  Neurological: Negative.  Negative for dizziness, focal weakness, seizures and headaches.  Psychiatric/Behavioral: Negative.  Negative for suicidal ideas.     Past Medical History:  Diagnosis Date   Allergy    Anemia    on Iron    Asthma    Diverticulitis    Headache    migraine in 2015   Hematoma 01/10/2015   History of blood transfusion    Hypertension    Infection    trich   Obesity    Ovarian cyst    Pneumonia    had 2 times 1997 and 2012   Rectus sheath hematoma s/p hysterectomy 12/31/14 01/12/2015   Seasonal allergies     Past Surgical History:  Procedure Laterality Date   ABDOMINAL HYSTERECTOMY N/A 12/31/2014   Procedure: HYSTERECTOMY ABDOMINAL;  Surgeon: Adam Phenix, MD;  Location: [facility];  Service:  Gynecology;  Laterality: N/A;   CESAREAN SECTION     FOREHEAD RECONSTRUCTION     as a child   SALPINGOOPHORECTOMY Right 12/31/2014   Procedure: RIGHT SALPINGO OOPHORECTOMY;  Surgeon: Adam Phenix, MD;  Location: [facility];  Service: Gynecology;  Laterality: Right;   TUBAL LIGATION     WISDOM TOOTH EXTRACTION      Family History  Problem Relation Age of Onset   Diabetes Mother    Heart disease Mother        chf   Hypertension Father    Colon cancer Neg Hx    Colon polyps Neg Hx    Esophageal cancer Neg Hx    Prostate cancer Neg Hx     Rectal cancer Neg Hx     Social History Reviewed with no changes to be made today.   Outpatient Medications Prior to Visit  Medication Sig Dispense Refill   albuterol (PROVENTIL) (2.5 MG/3ML) 0.083% nebulizer solution Take 3 mLs (2.5 mg total) by nebulization every 6 (six) hours as needed for wheezing or shortness of breath. 75 mL 12   albuterol (VENTOLIN HFA) 108 (90 Base) MCG/ACT inhaler Inhale 2 puffs into the lungs every 6 (six) hours as needed for wheezing or shortness of breath. 6.7 g 0   amoxicillin-clavulanate (AUGMENTIN) 875-125 MG tablet Take 1 tablet by mouth every 12 (twelve) hours. 14 tablet 0   budesonide-formoterol (SYMBICORT) 160-4.5 MCG/ACT inhaler Inhale 2 puffs into the lungs 2 (two) times daily. 10.2 g 0   amLODipine (NORVASC) 10 MG tablet Take 1 tablet (10 mg total) by mouth daily. 90 tablet 1   losartan (COZAAR) 100 MG tablet Take 1 tablet (100 mg total) by mouth daily. 90 tablet 1   oxyCODONE-acetaminophen (PERCOCET/ROXICET) 5-325 MG tablet Take 1 tablet by mouth every 6 (six) hours as needed for severe pain. 10 tablet 0   No facility-administered medications prior to visit.    Allergies  Allergen Reactions   Ibuprofen Shortness Of Breath   Azithromycin Hives   Other Itching and Rash    Pt states that she is allergic to "[place] [employer]".           Objective:    BP 116/77 (BP Location: Left Arm, Patient Position: Sitting, Cuff Size: Large)   Pulse 89   Ht 5\' 3"  (1.6 m)   Wt 269 lb 9.6 oz (122.3 kg)   LMP 12/05/2014   SpO2 99%   BMI 47.76 kg/m  Wt Readings from Last 3 Encounters:  03/09/23 269 lb 9.6 oz (122.3 kg)  11/26/22 267 lb (121.1 kg)  11/08/22 267 lb (121.1 kg)    Physical Exam Vitals and nursing note reviewed.  Constitutional:      Appearance: She is well-developed.  HENT:     Head: Normocephalic and atraumatic.  Cardiovascular:     Rate and Rhythm: Normal rate and regular rhythm.     Heart sounds: Normal heart sounds. No murmur  heard.    No friction rub. No gallop.  Pulmonary:     Effort: Pulmonary effort is normal. No tachypnea or respiratory distress.     Breath sounds: Normal breath sounds. No decreased breath sounds, wheezing, rhonchi or rales.  Chest:     Chest wall: No tenderness.  Abdominal:     General: Bowel sounds are normal.     Palpations: Abdomen is soft.  Musculoskeletal:        General: Normal range of motion.     Cervical back: Normal range  of motion.  Skin:    General: Skin is warm and dry.  Neurological:     Mental Status: She is alert and oriented to person, place, and time.     Coordination: Coordination normal.  Psychiatric:        Behavior: Behavior normal. Behavior is cooperative.        Thought Content: Thought content normal.        Judgment: Judgment normal.          Patient has been counseled extensively about nutrition and exercise as well as the importance of adherence with medications and regular follow-up. The patient was given clear instructions to go to ER or return to medical center if symptoms don't improve, worsen or new problems develop. The patient verbalized understanding.   Follow-up: Return in about 3 months (around 06/09/2023), or if symptoms worsen or fail to improve, for HTN.   Claiborne Rigg, FNP-BC [facility] [place], [place] [phone]   03/09/2023, 2:17 PM

## 2023-03-10 LAB — CBC WITH DIFFERENTIAL/PLATELET
Basophils Absolute: 0 10*3/uL (ref 0.0–0.2)
Basos: 0 %
EOS (ABSOLUTE): 0.2 10*3/uL (ref 0.0–0.4)
Eos: 3 %
Hematocrit: 40.5 % (ref 34.0–46.6)
Hemoglobin: 13.6 g/dL (ref 11.1–15.9)
Immature Grans (Abs): 0 10*3/uL (ref 0.0–0.1)
Immature Granulocytes: 0 %
Lymphocytes Absolute: 3.5 10*3/uL — ABNORMAL HIGH (ref 0.7–3.1)
Lymphs: 37 %
MCH: 29.2 pg (ref 26.6–33.0)
MCHC: 33.6 g/dL (ref 31.5–35.7)
MCV: 87 fL (ref 79–97)
Monocytes Absolute: 0.7 10*3/uL (ref 0.1–0.9)
Monocytes: 7 %
Neutrophils Absolute: 5 10*3/uL (ref 1.4–7.0)
Neutrophils: 53 %
Platelets: 288 10*3/uL (ref 150–450)
RBC: 4.66 x10E6/uL (ref 3.77–5.28)
RDW: 13.2 % (ref 11.7–15.4)
WBC: 9.4 10*3/uL (ref 3.4–10.8)

## 2023-03-10 LAB — CMP14+EGFR
ALT: 22 IU/L (ref 0–32)
AST: 23 IU/L (ref 0–40)
Albumin: 4.4 g/dL (ref 3.9–4.9)
Alkaline Phosphatase: 82 IU/L (ref 44–121)
BUN/Creatinine Ratio: 22 (ref 9–23)
BUN: 21 mg/dL (ref 6–24)
Bilirubin Total: 0.2 mg/dL (ref 0.0–1.2)
CO2: 22 mmol/L (ref 20–29)
Calcium: 9.5 mg/dL (ref 8.7–10.2)
Chloride: 103 mmol/L (ref 96–106)
Creatinine, Ser: 0.97 mg/dL (ref 0.57–1.00)
Globulin, Total: 2.8 g/dL (ref 1.5–4.5)
Glucose: 109 mg/dL — ABNORMAL HIGH (ref 70–99)
Potassium: 4.2 mmol/L (ref 3.5–5.2)
Sodium: 141 mmol/L (ref 134–144)
Total Protein: 7.2 g/dL (ref 6.0–8.5)
eGFR: 72 mL/min/{1.73_m2} (ref >59)

## 2023-03-22 ENCOUNTER — Ambulatory Visit: Payer: Self-pay | Admitting: Critical Care Medicine

## 2023-03-25 ENCOUNTER — Other Ambulatory Visit: Payer: Self-pay | Admitting: Critical Care Medicine

## 2023-03-25 ENCOUNTER — Other Ambulatory Visit: Payer: Self-pay

## 2023-03-25 DIAGNOSIS — J4520 Mild intermittent asthma, uncomplicated: Secondary | ICD-10-CM

## 2023-03-25 MED ORDER — BUDESONIDE-FORMOTEROL FUMARATE 160-4.5 MCG/ACT IN AERO
2.0000 | INHALATION_SPRAY | Freq: Two times a day (BID) | RESPIRATORY_TRACT | 0 refills | Status: DC
Start: 2023-03-25 — End: 2023-04-21
  Filled 2023-03-25: qty 10.2, 30d supply, fill #0

## 2023-03-25 MED ORDER — ALBUTEROL SULFATE HFA 108 (90 BASE) MCG/ACT IN AERS
2.0000 | INHALATION_SPRAY | Freq: Four times a day (QID) | RESPIRATORY_TRACT | 0 refills | Status: DC | PRN
  Filled 2023-03-25: qty 6.7, 25d supply, fill #0

## 2023-04-21 ENCOUNTER — Other Ambulatory Visit: Payer: Self-pay

## 2023-04-21 ENCOUNTER — Other Ambulatory Visit: Payer: Self-pay | Admitting: Critical Care Medicine

## 2023-04-21 DIAGNOSIS — J4520 Mild intermittent asthma, uncomplicated: Secondary | ICD-10-CM

## 2023-04-21 MED ORDER — BUDESONIDE-FORMOTEROL FUMARATE 160-4.5 MCG/ACT IN AERO
2.0000 | INHALATION_SPRAY | Freq: Two times a day (BID) | RESPIRATORY_TRACT | 2 refills | Status: DC
Start: 2023-04-21 — End: 2023-07-28
  Filled 2023-04-21: qty 10.2, 30d supply, fill #0
  Filled 2023-05-19: qty 10.2, 30d supply, fill #1
  Filled 2023-06-30: qty 10.2, 30d supply, fill #2

## 2023-04-21 MED ORDER — ALBUTEROL SULFATE HFA 108 (90 BASE) MCG/ACT IN AERS
2.0000 | INHALATION_SPRAY | Freq: Four times a day (QID) | RESPIRATORY_TRACT | 2 refills | Status: DC | PRN
Start: 2023-04-21 — End: 2023-07-28
  Filled 2023-04-21: qty 6.7, 25d supply, fill #0
  Filled 2023-05-19: qty 6.7, 25d supply, fill #1
  Filled 2023-06-30: qty 6.7, 25d supply, fill #2

## 2023-04-22 ENCOUNTER — Other Ambulatory Visit: Payer: Self-pay

## 2023-05-20 ENCOUNTER — Other Ambulatory Visit: Payer: Self-pay

## 2023-06-06 ENCOUNTER — Other Ambulatory Visit: Payer: Self-pay

## 2023-06-30 ENCOUNTER — Encounter: Payer: Self-pay | Admitting: Family Medicine

## 2023-06-30 ENCOUNTER — Other Ambulatory Visit: Payer: Self-pay

## 2023-06-30 ENCOUNTER — Ambulatory Visit: Payer: 59 | Attending: Family Medicine | Admitting: Family Medicine

## 2023-06-30 VITALS — BP 144/90 | HR 89 | Ht 63.0 in | Wt 280.6 lb

## 2023-06-30 DIAGNOSIS — M1712 Unilateral primary osteoarthritis, left knee: Secondary | ICD-10-CM

## 2023-06-30 DIAGNOSIS — I10 Essential (primary) hypertension: Secondary | ICD-10-CM | POA: Diagnosis not present

## 2023-06-30 DIAGNOSIS — R7303 Prediabetes: Secondary | ICD-10-CM

## 2023-06-30 DIAGNOSIS — Z01818 Encounter for other preprocedural examination: Secondary | ICD-10-CM | POA: Diagnosis not present

## 2023-06-30 DIAGNOSIS — I8390 Asymptomatic varicose veins of unspecified lower extremity: Secondary | ICD-10-CM | POA: Diagnosis not present

## 2023-06-30 LAB — POCT GLYCOSYLATED HEMOGLOBIN (HGB A1C): HbA1c, POC (prediabetic range): 6.4 % (ref 5.7–6.4)

## 2023-06-30 MED ORDER — AMLODIPINE BESYLATE 10 MG PO TABS
10.0000 mg | ORAL_TABLET | Freq: Every day | ORAL | 1 refills | Status: AC
Start: 2023-06-30 — End: ?
  Filled 2023-06-30 – 2023-09-06 (×2): qty 90, 90d supply, fill #0
  Filled 2023-12-08: qty 90, 90d supply, fill #1

## 2023-06-30 MED ORDER — LOSARTAN POTASSIUM 100 MG PO TABS
100.0000 mg | ORAL_TABLET | Freq: Every day | ORAL | 1 refills | Status: AC
Start: 2023-06-30 — End: ?
  Filled 2023-06-30 – 2023-09-06 (×2): qty 90, 90d supply, fill #0
  Filled 2023-12-08: qty 90, 90d supply, fill #1

## 2023-06-30 NOTE — Patient Instructions (Signed)
VISIT SUMMARY:  Today, we discussed your current health concerns and provided a dental clearance for your upcoming tooth extraction. We reviewed your hypertension, prediabetes, asthma, arthritis in your left knee, varicose veins, and dental health. We also talked about general health maintenance and lifestyle modifications to help manage your conditions.  YOUR PLAN:  -HYPERTENSION: Hypertension, or high blood pressure, can lead to serious health problems if not managed properly. Your blood pressure was elevated, likely due to missed medication and recent work shifts. Please make sure to take your medications, Amlodipine and Losartan, as prescribed and follow a low sodium diet.  -PREDIABETES: Prediabetes means your blood sugar levels are higher than normal but not yet high enough to be diagnosed as diabetes. Your A1c is stable at 6.4, which is close to the diabetes threshold. To prevent progression to type 2 diabetes, please focus on weight loss and follow a diabetic diet.  -ASTHMA: Asthma is a condition where your airways narrow and swell, making it difficult to breathe. You are currently managing this with Symbicort. Please continue with your current management plan.  -ARTHRITIS (LEFT KNEE): Arthritis in your left knee is causing pain and a locking sensation. You have a history of receiving injections for this. We will refer you to orthopedics for a possible injection to help manage your symptoms.  -VARICOSE VEINS: Varicose veins are enlarged, twisted veins that can cause discomfort. They are likely more visible due to prolonged standing at work. Please continue using compression stockings to help manage this condition.  -DENTAL HEALTH: You are preparing for a dental extraction to address broken extremities in your mouth and cavities. We have completed the clearance form for your dental procedure.  -GENERAL HEALTH MAINTENANCE: Please continue taking your current medications as prescribed. Focus on  lifestyle modifications to manage your prediabetes, and follow up with orthopedics for your knee arthritis. Continue using compression stockings for your varicose veins.  INSTRUCTIONS:  Please adhere to your medication regimen for hypertension and follow a low sodium diet. Focus on weight loss and a diabetic diet to manage prediabetes. Continue using Symbicort for asthma. Follow up with orthopedics for a possible injection for your left knee arthritis. Continue using compression stockings for varicose veins. We have completed the dental clearance form for your upcoming procedure.

## 2023-06-30 NOTE — Progress Notes (Signed)
Subjective:  Patient ID: Anne Schaefer, female    DOB: 21-Jul-1970  Age: 52 y.o. MRN: [id number]  CC: Knee Pain (Dental form completion/)   HPI Anne Schaefer is a 52 y.o. year old female with a history of hypertension, asthma.  Interval History: Discussed the use of AI scribe software for clinical note transcription with the patient, who gave verbal consent to proceed.   The patient, with a history of hypertension and asthma, presents for dental clearance prior to a tooth extraction. She also reports a history of broken extremities in the mouth and cavities, and is seeking dental treatment to improve her smile.She is currently managed on amlodipine, losartan, and Symbicort. She denies any history of heart procedures or valve surgeries.   She also reports arthritis in the left knee, which was previously managed with injections. The last injection was received a couple of years ago and she is now seeking another due to increasing pain and occasional locking of the knee.  She has a history of foot fractures and currently experiences muscle spasms, which are managed with Flexeril. However, she is unable to take this medication three times a day due to work commitments. She also reports varicose veins, which have been increasing in prominence.        Past Medical History:  Diagnosis Date   Allergy    Anemia    on Iron    Asthma    Diverticulitis    Headache    migraine in 2015   Hematoma 01/10/2015   History of blood transfusion    Hypertension    Infection    trich   Obesity    Ovarian cyst    Pneumonia    had 2 times 1997 and 2012   Rectus sheath hematoma s/p hysterectomy 12/31/14 01/12/2015   Seasonal allergies     Past Surgical History:  Procedure Laterality Date   ABDOMINAL HYSTERECTOMY N/A 12/31/2014   Procedure: HYSTERECTOMY ABDOMINAL;  Surgeon: Adam Phenix, MD;  Location: [facility];  Service: Gynecology;  Laterality: N/A;   CESAREAN SECTION     FOREHEAD  RECONSTRUCTION     as a child   SALPINGOOPHORECTOMY Right 12/31/2014   Procedure: RIGHT SALPINGO OOPHORECTOMY;  Surgeon: Adam Phenix, MD;  Location: [facility];  Service: Gynecology;  Laterality: Right;   TUBAL LIGATION     WISDOM TOOTH EXTRACTION      Family History  Problem Relation Age of Onset   Diabetes Mother    Heart disease Mother        chf   Hypertension Father    Colon cancer Neg Hx    Colon polyps Neg Hx    Esophageal cancer Neg Hx    Prostate cancer Neg Hx    Rectal cancer Neg Hx     Social History   Socioeconomic History   Marital status: Single    Spouse name: Not on file   Number of children: Not on file   Years of education: Not on file   Highest education level: Not on file  Occupational History   Not on file  Tobacco Use   Smoking status: Every Day    Current packs/day: 0.25    Average packs/day: 0.3 packs/day for 8.0 years (2.0 ttl pk-yrs)    Types: Cigarettes   Smokeless tobacco: Never   Tobacco comments:    Smoke 2 packs during week ; 5 cigs a day  Vaping Use   Vaping status: Never Used  Substance  and Sexual Activity   Alcohol use: Yes    Comment: occ   Drug use: Yes    Types: Marijuana    Comment: occasionally   Sexual activity: Not Currently    Birth control/protection: Surgical  Other Topics Concern   Not on file  Social History Narrative   Not on file   Social Drivers of Health   Financial Resource Strain: Not on file  Food Insecurity: No Food Insecurity (08/12/2022)   Hunger Vital Sign    Worried About Running Out of Food in the Last Year: Never true    Ran Out of Food in the Last Year: Never true  Transportation Needs: Not on file  Physical Activity: Not on file  Stress: Not on file  Social Connections: Unknown (01/22/2022)   [facility]   Social Network    Social Network: Not on file    Allergies  Allergen Reactions   Ibuprofen Shortness Of Breath   Azithromycin Hives   Other Itching and  Rash    Pt states that she is allergic to "[employer]".        Outpatient Medications Prior to Visit  Medication Sig Dispense Refill   albuterol (PROVENTIL) (2.5 MG/3ML) 0.083% nebulizer solution Take 3 mLs (2.5 mg total) by nebulization every 6 (six) hours as needed for wheezing or shortness of breath. 75 mL 12   albuterol (VENTOLIN HFA) 108 (90 Base) MCG/ACT inhaler Inhale 2 puffs into the lungs every 6 (six) hours as needed for wheezing or shortness of breath. 6.7 g 2   budesonide-formoterol (SYMBICORT) 160-4.5 MCG/ACT inhaler Inhale 2 puffs into the lungs 2 (two) times daily. 10.2 g 2   cyclobenzaprine (FLEXERIL) 10 MG tablet Take 1 tablet (10 mg total) by mouth 3 (three) times daily as needed for muscle spasms. 60 tablet 1   amLODipine (NORVASC) 10 MG tablet Take 1 tablet (10 mg total) by mouth daily. 90 tablet 1   losartan (COZAAR) 100 MG tablet Take 1 tablet (100 mg total) by mouth daily. 90 tablet 1   amoxicillin-clavulanate (AUGMENTIN) 875-125 MG tablet Take 1 tablet by mouth every 12 (twelve) hours. (Patient not taking: Reported on 06/30/2023) 14 tablet 0   No facility-administered medications prior to visit.     ROS Review of Systems  Constitutional:  Negative for activity change and appetite change.  HENT:  Negative for sinus pressure and sore throat.   Respiratory:  Negative for chest tightness, shortness of breath and wheezing.   Cardiovascular:  Negative for chest pain and palpitations.  Gastrointestinal:  Negative for abdominal distention, abdominal pain and constipation.  Genitourinary: Negative.   Musculoskeletal:        See Hpi  Psychiatric/Behavioral:  Negative for behavioral problems and dysphoric mood.     Objective:  BP (!) 144/90   Pulse 89   Ht 5\' 3"  (1.6 m)   Wt 280 lb 9.6 oz (127.3 kg)   LMP 12/05/2014   SpO2 98%   BMI 49.71 kg/m      06/30/2023    9:33 AM 06/30/2023    8:57 AM 03/09/2023    1:53 PM  BP/Weight  Systolic BP 144 142 116   Diastolic BP 90 84 77  Wt. (Lbs)  280.6 269.6  BMI  49.71 kg/m2 47.76 kg/m2      Physical Exam Constitutional:      Appearance: She is well-developed.  Cardiovascular:     Rate and Rhythm: Normal rate.  Heart sounds: Normal heart sounds. No murmur heard. Pulmonary:     Effort: Pulmonary effort is normal.     Breath sounds: Normal breath sounds. No wheezing or rales.  Chest:     Chest wall: No tenderness.  Abdominal:     General: Bowel sounds are normal. There is no distension.     Palpations: Abdomen is soft. There is no mass.     Tenderness: There is no abdominal tenderness.  Musculoskeletal:     Right knee: Normal range of motion. No tenderness.     Left knee: Decreased range of motion. Tenderness present over the medial joint line.     Right lower leg: No edema.     Left lower leg: No edema.  Neurological:     Mental Status: She is alert and oriented to person, place, and time.  Psychiatric:        Mood and Affect: Mood normal.        Latest Ref Rng & Units 03/09/2023    2:20 PM 11/26/2022    9:32 AM 11/08/2022    9:29 AM  CMP  Glucose 70 - 99 mg/dL 284  132  440   BUN 6 - 24 mg/dL 21  9  14    Creatinine 0.57 - 1.00 mg/dL 1.02  7.25  3.66   Sodium 134 - 144 mmol/L 141  138  139   Potassium 3.5 - 5.2 mmol/L 4.2  3.8  3.8   Chloride 96 - 106 mmol/L 103  101  101   CO2 20 - 29 mmol/L 22  27  28    Calcium 8.7 - 10.2 mg/dL 9.5  9.4  9.2   Total Protein 6.0 - 8.5 g/dL 7.2   7.2   Total Bilirubin 0.0 - 1.2 mg/dL 0.2   0.8   Alkaline Phos 44 - 121 IU/L 82   59   AST 0 - 40 IU/L 23   19   ALT 0 - 32 IU/L 22   31     Lipid Panel     Component Value Date/Time   CHOL 156 06/08/2022 1551   TRIG 125 06/08/2022 1551   HDL 40 06/08/2022 1551   CHOLHDL 3.9 06/08/2022 1551   CHOLHDL 4.0 09/03/2015 1136   VLDL 21 09/03/2015 1136   LDLCALC 94 06/08/2022 1551    CBC    Component Value Date/Time   WBC 9.4 03/09/2023 1420   WBC 10.7 (H) 11/26/2022 0932   RBC 4.66  03/09/2023 1420   RBC 4.42 11/26/2022 0932   HGB 13.6 03/09/2023 1420   HCT 40.5 03/09/2023 1420   PLT 288 03/09/2023 1420   MCV 87 03/09/2023 1420   MCH 29.2 03/09/2023 1420   MCH 28.7 11/26/2022 0932   MCHC 33.6 03/09/2023 1420   MCHC 31.8 11/26/2022 0932   RDW 13.2 03/09/2023 1420   LYMPHSABS 3.5 (H) 03/09/2023 1420   MONOABS 0.8 11/26/2022 0932   EOSABS 0.2 03/09/2023 1420   BASOSABS 0.0 03/09/2023 1420    Lab Results  Component Value Date   HGBA1C 6.4 06/30/2023    The 10-year ASCVD risk score (Arnett DK, et al., 2019) is: 13.2%   Values used to calculate the score:     Age: 57 years     Sex: Female     Is Non-Hispanic African American: Yes     Diabetic: No     Tobacco smoker: Yes     Systolic Blood Pressure: 144 mmHg     Is  BP treated: Yes     HDL Cholesterol: 40 mg/dL     Total Cholesterol: 156 mg/dL  Assessment & Plan:      Hypertension Elevated blood pressure likely due to missed medication and recent work shift. Currently managed on Amlodipine and Losartan. -Advised to adhere to medication regimen and low sodium diet.  Prediabetes Stable A1c at 6.4, close to diabetes threshold (6.5). -Encouraged lifestyle modifications including weight loss and diabetic diet to prevent progression to type 2 diabetes mellitus.  Asthma Managed on Symbicort. -Continue current management.  Arthritis (Left Knee) Reports of pain and locking sensation. History of receiving injections for management. -Referral to orthopedics for possible injection.  Varicose Veins Reports of increased visibility of varicose veins, likely due to prolonged standing at work. -Advised to continue use of compression stockings.  Operative clearance/dental Health Upcoming dental extraction procedure. -Completed clearance form for dental procedure.  General Health Maintenance -Continue current medications as prescribed. -Encouraged lifestyle modifications for prediabetes  management. -Orthopedic referral for knee arthritis management. -Continue use of compression stockings for varicose veins.          Meds ordered this encounter  Medications   amLODipine (NORVASC) 10 MG tablet    Sig: Take 1 tablet (10 mg total) by mouth daily.    Dispense:  90 tablet    Refill:  1   losartan (COZAAR) 100 MG tablet    Sig: Take 1 tablet (100 mg total) by mouth daily.    Dispense:  90 tablet    Refill:  1    Follow-up: Return in about 3 months (around 09/28/2023) for Chronic medical conditions.       Hoy Register, MD, FAAFP. [facility] [place], [place] [phone]   06/30/2023, 10:45 AM

## 2023-07-14 ENCOUNTER — Encounter: Payer: Self-pay | Admitting: Surgical

## 2023-07-14 ENCOUNTER — Other Ambulatory Visit (INDEPENDENT_AMBULATORY_CARE_PROVIDER_SITE_OTHER): Payer: 59

## 2023-07-14 ENCOUNTER — Ambulatory Visit (INDEPENDENT_AMBULATORY_CARE_PROVIDER_SITE_OTHER): Payer: 59 | Admitting: Surgical

## 2023-07-14 DIAGNOSIS — M25562 Pain in left knee: Secondary | ICD-10-CM

## 2023-07-14 DIAGNOSIS — M1712 Unilateral primary osteoarthritis, left knee: Secondary | ICD-10-CM | POA: Diagnosis not present

## 2023-07-15 MED ORDER — LIDOCAINE HCL 1 % IJ SOLN
5.0000 mL | INTRAMUSCULAR | Status: AC | PRN
Start: 2023-07-14 — End: 2023-07-14
  Administered 2023-07-14: 5 mL

## 2023-07-15 MED ORDER — BUPIVACAINE HCL 0.25 % IJ SOLN
4.0000 mL | INTRAMUSCULAR | Status: AC | PRN
Start: 2023-07-14 — End: 2023-07-14
  Administered 2023-07-14: 4 mL via INTRA_ARTICULAR

## 2023-07-15 MED ORDER — METHYLPREDNISOLONE ACETATE 40 MG/ML IJ SUSP
40.0000 mg | INTRAMUSCULAR | Status: AC | PRN
Start: 2023-07-14 — End: 2023-07-14
  Administered 2023-07-14: 40 mg via INTRA_ARTICULAR

## 2023-07-15 NOTE — Progress Notes (Signed)
 Office Visit Note   Patient: Anne Schaefer           Date of Birth: [birth year]           MRN: [id number] Visit Date: 07/14/2023 Requested by: Delbert Clam, MD [address] [place],  [place] [address] PCP: Brien Belvie BRAVO, MD  Subjective: Chief Complaint  Patient presents with   Left Knee - Pain    HPI: Anne Schaefer is a 53 y.o. female who presents to the office reporting left knee pain.  She reports a history of knee arthritis.  She previously lived in [place]  and had several injections into her left knee while she lived there with the last injection lasting about 1.5 years before return of pain.  It has been about 3 years since the last injection.  She states that pain has worsened over the last several months with pain primarily localizing to the anterior medial aspect of the knee.  Denies any history of injury.  She will have occasional clicking sensation when the pain gets worse but denies any history of locking episodes.  Does have stiffness with immobility.  Pain never wakes her up from sleep at night.  No history of prior knee surgery though she has had prior foot and ankle fractures.  No right knee symptoms.  Does have occasional right and left groin pain though this is only intermittent and not consistent.  Does not seem related to the knee pain.  No radicular pain.  She has tried Tylenol  and muscle relaxers with some relief as well as using compression socks and topical medications.  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  Her goal for 2025 is to try and exercise more..                ROS: All systems reviewed are negative as they relate to the chief complaint within the history of present illness.  Patient denies fevers or chills.  Assessment & Plan: Visit Diagnoses:  1. Unilateral primary osteoarthritis, left knee   2. Acute pain of left knee     Plan: Patient is a 53 year old female who presents for evaluation of left knee pain.  She has left  knee radiographs taken today demonstrating mild to moderate medial joint space narrowing with slight varus alignment.  She has excellent relief in the past with cortisone injections and she would like to repeat this today.  Last injection lasted for about 1.5 years.  Her goal is to try and lose some weight which should help with her knee pain as well.  Cortisone injection administered today and patient tolerated procedure well.  She will follow-up with the office as needed if symptoms return or do not improve.  Follow-Up Instructions: No follow-ups on file.   Orders:  Orders Placed This Encounter  Procedures   XR Knee 1-2 Views Left   No orders of the defined types were placed in this encounter.     Procedures: Large Joint Inj: L knee on 07/14/2023 8:03 AM Indications: diagnostic evaluation, joint swelling and pain Details: 18 G 1.5 in needle, superolateral approach  Arthrogram: No  Medications: 5 mL lidocaine  1 %; 40 mg methylPREDNISolone  acetate 40 MG/ML; 4 mL bupivacaine  0.25 % Outcome: tolerated well, no immediate complications Procedure, treatment alternatives, risks and benefits explained, specific risks discussed. Consent was given by the patient. Immediately prior to procedure a time out was called to verify the correct patient, procedure, equipment,  support staff and site/side marked as required. Patient was prepped and draped in the usual sterile fashion.       Clinical Data: No additional findings.  Objective: Vital Signs: LMP 12/05/2014   Physical Exam:  Constitutional: Patient appears well-developed HEENT:  Head: Normocephalic Eyes:EOM are normal Neck: Normal range of motion Cardiovascular: Normal rate Pulmonary/chest: Effort normal Neurologic: Patient is alert Skin: Skin is warm Psychiatric: Patient has normal mood and affect  Ortho Exam: Ortho exam demonstrates left knee with 0 degrees extension and 115 degrees of knee flexion.  No effusion noted.  Mild  tenderness over the medial joint line.  No tenderness over the lateral joint line.  Negative anterior and posterior drawer sign.  Negative Lachman exam.  No significant laxity to varus or valgus stress at 0 or 30 degrees.  No pain with hip range of motion bilaterally.  Negative FADIR sign.  No calf tenderness.  Negative Homans' sign.  Left leg is warm and well-perfused.  Able to perform straight leg raise without extensor lag.  No cellulitis or skin changes noted.  Specialty Comments:  No specialty comments available.  Imaging: No results found.   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   Lung nodule 09/03/2015   S/P abdominal supracervical hysterectomy and right salpingo-oophorectomy on 12/31/14 01/12/2015   Smoking 03/08/2014   Essential hypertension, benign 03/08/2014   Dermoid cyst of ovary 05/21/2013   Hidradenitis suppurativa 04/09/2012   Past Medical History:  Diagnosis Date   Allergy    Anemia    on Iron    Asthma    Diverticulitis    Headache    migraine in 2015   Hematoma 01/10/2015   History of blood transfusion    Hypertension    Infection    trich   Obesity    Ovarian cyst    Pneumonia    had 2 times 1997 and 2012   Rectus sheath hematoma s/p hysterectomy 12/31/14 01/12/2015   Seasonal allergies     Family History  Problem Relation Age of Onset   Diabetes Mother    Heart disease Mother        chf   Hypertension Father    Colon cancer Neg Hx    Colon polyps Neg Hx    Esophageal cancer Neg Hx    Prostate cancer Neg Hx    Rectal cancer Neg Hx     Past Surgical History:  Procedure Laterality Date   ABDOMINAL HYSTERECTOMY N/A 12/31/2014   Procedure: HYSTERECTOMY ABDOMINAL;  Surgeon: Lynwood KANDICE Solomons, MD;  Location: [facility];  Service: Gynecology;  Laterality: N/A;   CESAREAN SECTION     FOREHEAD RECONSTRUCTION     as a  child   SALPINGOOPHORECTOMY Right 12/31/2014   Procedure: RIGHT SALPINGO OOPHORECTOMY;  Surgeon: Lynwood KANDICE Solomons, MD;  Location: [facility];  Service: Gynecology;  Laterality: Right;   TUBAL LIGATION     WISDOM TOOTH EXTRACTION     Social History   Occupational History   Not on file  Tobacco Use   Smoking status: Every Day    Current packs/day: 0.25    Average packs/day: 0.3 packs/day for 8.0 years (2.0 ttl pk-yrs)    Types: Cigarettes   Smokeless tobacco: Never   Tobacco comments:    Smoke 2 packs during week ; 5 cigs a day  Vaping Use  Vaping status: Never Used  Substance and Sexual Activity   Alcohol use: Yes    Comment: occ   Drug use: Yes    Types: Marijuana    Comment: occasionally   Sexual activity: Not Currently    Birth control/protection: Surgical

## 2023-07-28 ENCOUNTER — Other Ambulatory Visit: Payer: Self-pay | Admitting: Critical Care Medicine

## 2023-07-28 ENCOUNTER — Other Ambulatory Visit: Payer: Self-pay

## 2023-07-28 DIAGNOSIS — J4520 Mild intermittent asthma, uncomplicated: Secondary | ICD-10-CM

## 2023-07-28 MED ORDER — ALBUTEROL SULFATE HFA 108 (90 BASE) MCG/ACT IN AERS
2.0000 | INHALATION_SPRAY | Freq: Four times a day (QID) | RESPIRATORY_TRACT | 2 refills | Status: DC | PRN
Start: 2023-07-28 — End: 2023-11-07
  Filled 2023-07-28: qty 6.7, 25d supply, fill #0
  Filled 2023-09-06: qty 6.7, 25d supply, fill #1
  Filled 2023-10-05: qty 6.7, 25d supply, fill #2

## 2023-07-28 MED ORDER — BUDESONIDE-FORMOTEROL FUMARATE 160-4.5 MCG/ACT IN AERO
2.0000 | INHALATION_SPRAY | Freq: Two times a day (BID) | RESPIRATORY_TRACT | 2 refills | Status: DC
Start: 2023-07-28 — End: 2023-11-07
  Filled 2023-07-28: qty 10.2, 30d supply, fill #0
  Filled 2023-09-06: qty 10.2, 30d supply, fill #1
  Filled 2023-10-05: qty 10.2, 30d supply, fill #2

## 2023-07-28 NOTE — Telephone Encounter (Signed)
Requested Prescriptions  Pending Prescriptions Disp Refills   budesonide-formoterol (SYMBICORT) 160-4.5 MCG/ACT inhaler 10.2 g 2    Sig: Inhale 2 puffs into the lungs 2 (two) times daily.     Pulmonology:  Combination Products Passed - 07/28/2023  8:46 AM      Passed - Valid encounter within last 12 months    Recent Outpatient Visits           4 weeks ago Prediabetes   [place] [facility] - A Dept Of [place]. [facility] Hoy Register, MD   4 months ago Primary hypertension   [place] [facility] [place] - A Dept Of [place]. [facility] Claiborne Rigg, NP   10 months ago Vaginal lesion   [place] [facility] [place] - A Dept Of [place]. [facility], Marylene Land M, [place]   11 months ago Morbid obesity due to excess calories [facility])   [place] [facility] - A Dept Of [place]. [facility] Storm Frisk, MD   1 year ago Asthma exacerbation in COPD [facility])   [place] [facility] - A Dept Of [place]. [facility] Storm Frisk, MD       Future Appointments             In 3 months Hoy Register, MD [facility] - A Dept Of [place]a Bridegroom. [facility]             albuterol (VENTOLIN HFA) 108 (90 Base) MCG/ACT inhaler 6.7 g 2    Sig: Inhale 2 puffs into the lungs every 6 (six) hours as needed for wheezing or shortness of breath.     Pulmonology:  Beta Agonists 2 Failed - 07/28/2023  8:46 AM      Failed - Last BP in normal range    BP Readings from Last 1 Encounters:  06/30/23 (!) 144/90         Passed - Last Heart Rate in normal range    Pulse Readings from Last 1 Encounters:  06/30/23 89         Passed - Valid encounter within last 12 months    Recent Outpatient Visits           4 weeks ago Prediabetes   [place] [facility] [place] - A Dept Of [place]. [facility] Hoy Register, MD   4 months ago Primary hypertension   [facility] [place] - A Dept Of [place]. [facility] Claiborne Rigg, NP   10 months ago Vaginal lesion   [place] [facility] [place] - A Dept Of [place]. [facility], Marylene Land M, [place]   11 months ago Morbid obesity due to excess calories [facility])   [place] [facility] - A Dept Of [place]. [facility] Storm Frisk, MD   1 year ago Asthma exacerbation in COPD [facility])    [facility] - A Dept Of . [facility] Storm Frisk, MD       Future Appointments             In 3 months Hoy Register, MD [facility] - A Dept Of [place] Bridegroom. [facility]

## 2023-08-03 ENCOUNTER — Other Ambulatory Visit: Payer: Self-pay

## 2023-09-06 ENCOUNTER — Other Ambulatory Visit: Payer: Self-pay

## 2023-09-21 ENCOUNTER — Emergency Department (HOSPITAL_COMMUNITY)
Admission: EM | Admit: 2023-09-21 | Discharge: 2023-09-22 | Disposition: A | Discharge status: Home / Self Care | Attending: Emergency Medicine | Admitting: Emergency Medicine

## 2023-09-21 ENCOUNTER — Emergency Department (HOSPITAL_COMMUNITY)

## 2023-09-21 ENCOUNTER — Other Ambulatory Visit: Payer: Self-pay

## 2023-09-21 DIAGNOSIS — J45901 Unspecified asthma with (acute) exacerbation: Secondary | ICD-10-CM | POA: Insufficient documentation

## 2023-09-21 DIAGNOSIS — R079 Chest pain, unspecified: Secondary | ICD-10-CM | POA: Diagnosis not present

## 2023-09-21 DIAGNOSIS — Z7952 Long term (current) use of systemic steroids: Secondary | ICD-10-CM | POA: Diagnosis not present

## 2023-09-21 DIAGNOSIS — Z7951 Long term (current) use of inhaled steroids: Secondary | ICD-10-CM | POA: Insufficient documentation

## 2023-09-21 DIAGNOSIS — R0602 Shortness of breath: Secondary | ICD-10-CM | POA: Diagnosis not present

## 2023-09-21 DIAGNOSIS — R918 Other nonspecific abnormal finding of lung field: Secondary | ICD-10-CM | POA: Diagnosis not present

## 2023-09-21 DIAGNOSIS — J40 Bronchitis, not specified as acute or chronic: Secondary | ICD-10-CM

## 2023-09-21 DIAGNOSIS — R059 Cough, unspecified: Secondary | ICD-10-CM | POA: Diagnosis not present

## 2023-09-21 DIAGNOSIS — J45909 Unspecified asthma, uncomplicated: Secondary | ICD-10-CM | POA: Diagnosis not present

## 2023-09-21 DIAGNOSIS — R591 Generalized enlarged lymph nodes: Secondary | ICD-10-CM | POA: Diagnosis not present

## 2023-09-21 LAB — BASIC METABOLIC PANEL
Anion gap: 10 (ref 5–15)
BUN: 15 mg/dL (ref 6–20)
CO2: 27 mmol/L (ref 22–32)
Calcium: 9.5 mg/dL (ref 8.9–10.3)
Chloride: 101 mmol/L (ref 98–111)
Creatinine, Ser: 1.01 mg/dL — ABNORMAL HIGH (ref 0.44–1.00)
GFR, Estimated: >60 mL/min (ref >60)
Glucose, Bld: 106 mg/dL — ABNORMAL HIGH (ref 70–99)
Potassium: 3.9 mmol/L (ref 3.5–5.1)
Sodium: 138 mmol/L (ref 135–145)

## 2023-09-21 LAB — CBC WITH DIFFERENTIAL/PLATELET
Abs Immature Granulocytes: 0.05 10*3/uL (ref 0.00–0.07)
Basophils Absolute: 0 10*3/uL (ref 0.0–0.1)
Basophils Relative: 0 %
Eosinophils Absolute: 0.1 10*3/uL (ref 0.0–0.5)
Eosinophils Relative: 1 %
HCT: 42.8 % (ref 36.0–46.0)
Hemoglobin: 13.6 g/dL (ref 12.0–15.0)
Immature Granulocytes: 1 %
Lymphocytes Relative: 33 %
Lymphs Abs: 3.3 10*3/uL (ref 0.7–4.0)
MCH: 28.4 pg (ref 26.0–34.0)
MCHC: 31.8 g/dL (ref 30.0–36.0)
MCV: 89.4 fL (ref 80.0–100.0)
Monocytes Absolute: 0.7 10*3/uL (ref 0.1–1.0)
Monocytes Relative: 7 %
Neutro Abs: 5.9 10*3/uL (ref 1.7–7.7)
Neutrophils Relative %: 58 %
Platelets: 305 10*3/uL (ref 150–400)
RBC: 4.79 MIL/uL (ref 3.87–5.11)
RDW: 14 % (ref 11.5–15.5)
WBC: 10.1 10*3/uL (ref 4.0–10.5)
nRBC: 0 % (ref 0.0–0.2)

## 2023-09-21 LAB — RESP PANEL BY RT-PCR (RSV, FLU A&B, COVID)  RVPGX2
Influenza A by PCR: NEGATIVE
Influenza B by PCR: NEGATIVE
Resp Syncytial Virus by PCR: NEGATIVE
SARS Coronavirus 2 by RT PCR: NEGATIVE

## 2023-09-21 MED ORDER — ACETAMINOPHEN 325 MG PO TABS
650.0000 mg | ORAL_TABLET | Freq: Once | ORAL | Status: AC | PRN
  Administered 2023-09-21: 650 mg via ORAL
  Filled 2023-09-21: qty 2

## 2023-09-21 MED ORDER — MORPHINE SULFATE (PF) 4 MG/ML IV SOLN
4.0000 mg | Freq: Once | INTRAVENOUS | Status: AC
  Administered 2023-09-22: 4 mg via INTRAVENOUS
  Filled 2023-09-21: qty 1

## 2023-09-21 MED ORDER — ONDANSETRON HCL 4 MG/2ML IJ SOLN
4.0000 mg | Freq: Once | INTRAMUSCULAR | Status: AC
  Administered 2023-09-22: 4 mg via INTRAVENOUS
  Filled 2023-09-21: qty 2

## 2023-09-21 NOTE — ED Provider Triage Note (Signed)
 Emergency Medicine Provider Triage Evaluation Note  Anne Schaefer , a 53 y.o. female  was evaluated in triage.  Pt complains of SOB, cough.  Review of Systems  Positive:  Negative:   Physical Exam  BP (!) 117/59 (BP Location: Right Arm)   Pulse (!) 111   Temp 98.5 F (36.9 C) (Oral)   Resp 19   Ht 5\' 3"  (1.6 m)   Wt 127.3 kg   LMP 12/05/2014   SpO2 97%   BMI 49.71 kg/m  Gen:   Awake, no distress   Resp:  Normal effort MSK:   Moves extremities without difficulty  Other:    Medical Decision Making  Medically screening exam initiated at 5:08 PM.  Appropriate orders placed.  Anne Schaefer was informed that the remainder of the evaluation will be completed by another provider, this initial triage assessment does not replace that evaluation, and the importance of remaining in the ED until their evaluation is complete.  SOB and cough x4-5 days. Hx asthma and has been using nebulizing treatments at home. No fever, nausea, vomiting, diarrhea. States that her cough sounds like "infection".   Anne Schaefer, [place] 09/21/23 1709

## 2023-09-21 NOTE — ED Triage Notes (Signed)
 The pt is c/o some shortness of breath and has asthma and she feels like she has something other than asthma productive cough  thick yellow sputum  she still smokes lmp none

## 2023-09-21 NOTE — ED Provider Notes (Signed)
 [facility] EMERGENCY DEPARTMENT AT [facility] Provider Note   CSN: [id number] Arrival date & time: 09/21/23  1649     History {Add pertinent medical, surgical, social history, OB history to HPI:1} Chief Complaint  Patient presents with   Shortness of Breath    Anne Schaefer is a 53 y.o. female.  Presents to the emergency department with complaints of shortness of breath and chest pain.  Patient reports that she has had a cough that is productive of sputum that has a bad taste.  She does have a history of asthma but this feels different than her asthma attacks.       Home Medications Prior to Admission medications   Medication Sig Start Date End Date Taking? Authorizing Provider  albuterol (PROVENTIL) (2.5 MG/3ML) 0.083% nebulizer solution Take 3 mLs (2.5 mg total) by nebulization every 6 (six) hours as needed for wheezing or shortness of breath. 06/08/22   Storm Frisk, MD  albuterol (VENTOLIN HFA) 108 (90 Base) MCG/ACT inhaler Inhale 2 puffs into the lungs every 6 (six) hours as needed for wheezing or shortness of breath. 07/28/23   Hoy Register, MD  amLODipine (NORVASC) 10 MG tablet Take 1 tablet (10 mg total) by mouth daily. 06/30/23   Hoy Register, MD  amoxicillin-clavulanate (AUGMENTIN) 875-125 MG tablet Take 1 tablet by mouth every 12 (twelve) hours. Patient not taking: Reported on 06/30/2023 03/04/23   Vallery Sa, Amy L, PA  budesonide-formoterol [facility]) 160-4.5 MCG/ACT inhaler Inhale 2 puffs into the lungs 2 (two) times daily. 07/28/23   Hoy Register, MD  cyclobenzaprine (FLEXERIL) 10 MG tablet Take 1 tablet (10 mg total) by mouth 3 (three) times daily as needed for muscle spasms. 03/09/23   Claiborne Rigg, NP  losartan (COZAAR) 100 MG tablet Take 1 tablet (100 mg total) by mouth daily. 06/30/23   Hoy Register, MD      Allergies    Ibuprofen, Azithromycin, and Other    Review of Systems   Review of Systems  Physical Exam Updated Vital  Signs BP (!) 118/58 (BP Location: Right Arm)   Pulse 81   Temp 98.2 F (36.8 C) (Oral)   Resp 18   Ht 5\' 3"  (1.6 m)   Wt 127.3 kg   LMP 12/05/2014   SpO2 100%   BMI 49.71 kg/m  Physical Exam Vitals and nursing note reviewed.  Constitutional:      General: She is not in acute distress.    Appearance: She is well-developed.  HENT:     Head: Normocephalic and atraumatic.     Mouth/Throat:     Mouth: Mucous membranes are moist.  Eyes:     General: Vision grossly intact. Gaze aligned appropriately.     Extraocular Movements: Extraocular movements intact.     Conjunctiva/sclera: Conjunctivae normal.  Cardiovascular:     Rate and Rhythm: Normal rate and regular rhythm.     Pulses: Normal pulses.     Heart sounds: Normal heart sounds, S1 normal and S2 normal. No murmur heard.    No friction rub. No gallop.  Pulmonary:     Effort: Pulmonary effort is normal. No respiratory distress.     Breath sounds: Normal breath sounds.  Abdominal:     General: Bowel sounds are normal.     Palpations: Abdomen is soft.     Tenderness: There is no abdominal tenderness. There is no guarding or rebound.     Hernia: No hernia is present.  Musculoskeletal:  General: No swelling.     Cervical back: Full passive range of motion without pain, normal range of motion and neck supple. No spinous process tenderness or muscular tenderness. Normal range of motion.     Right lower leg: No edema.     Left lower leg: No edema.  Skin:    General: Skin is warm and dry.     Capillary Refill: Capillary refill takes less than 2 seconds.     Findings: No ecchymosis, erythema, rash or wound.  Neurological:     General: No focal deficit present.     Mental Status: She is alert and oriented to person, place, and time.     GCS: GCS eye subscore is 4. GCS verbal subscore is 5. GCS motor subscore is 6.     Cranial Nerves: Cranial nerves 2-12 are intact.     Sensory: Sensation is intact.     Motor: Motor  function is intact.     Coordination: Coordination is intact.  Psychiatric:        Attention and Perception: Attention normal.        Mood and Affect: Mood normal.        Speech: Speech normal.        Behavior: Behavior normal.     ED Results / Procedures / Treatments   Labs (all labs ordered are listed, but only abnormal results are displayed) Labs Reviewed  BASIC METABOLIC PANEL - Abnormal; Notable for the following components:      Result Value   Glucose, Bld 106 (*)    Creatinine, Ser 1.01 (*)    All other components within normal limits  RESP PANEL BY RT-PCR (RSV, FLU A&B, COVID)  RVPGX2  CBC WITH DIFFERENTIAL/PLATELET    EKG EKG Interpretation Date/Time:  Wednesday September 21 2023 16:55:55 EDT Ventricular Rate:  111 PR Interval:  130 QRS Duration:  78 QT Interval:  342 QTC Calculation: 465 R Axis:   93  Text Interpretation: Sinus tachycardia Right atrial enlargement Rightward axis Borderline ECG When compared with ECG of 29-Oct-2022 23:00, No acute changes Confirmed by Gilda Crease [phone]) on 09/21/2023 11:32:07 PM  Radiology DG Chest 2 View Result Date: 09/21/2023 CLINICAL DATA:  Cough and short of breath EXAM: CHEST - 2 VIEW COMPARISON:  03/20/2022 FINDINGS: Mild airways thickening. No focal opacity, pleural effusion or pneumothorax. Normal cardiac size. IMPRESSION: Mild airways thickening suggesting bronchitis or reactive airways. Electronically Signed   By: Jasmine Pang M.D.   On: 09/21/2023 20:04    Procedures Procedures  {Document cardiac monitor, telemetry assessment procedure when appropriate:1}  Medications Ordered in ED Medications  ondansetron (ZOFRAN) injection 4 mg (has no administration in time range)  morphine (PF) 4 MG/ML injection 4 mg (has no administration in time range)  acetaminophen (TYLENOL) tablet 650 mg (650 mg Oral Given 09/21/23 2159)    ED Course/ Medical Decision Making/ A&P   {   Click here for ABCD2, HEART and other  calculatorsREFRESH Note before signing :1}                              Medical Decision Making Amount and/or Complexity of Data Reviewed Radiology: ordered.  Risk OTC drugs. Prescription drug management.   ***  {Document critical care time when appropriate:1} {Document review of labs and clinical decision tools ie heart score, Chads2Vasc2 etc:1}  {Document your independent review of radiology images, and any outside records:1} {Document your discussion  with family members, caretakers, and with consultants:1} {Document social determinants of health affecting pt's care:1} {Document your decision making why or why not admission, treatments were needed:1} Final Clinical Impression(s) / ED Diagnoses Final diagnoses:  None    Rx / DC Orders ED Discharge Orders     None

## 2023-09-22 ENCOUNTER — Emergency Department (HOSPITAL_COMMUNITY)

## 2023-09-22 ENCOUNTER — Other Ambulatory Visit: Payer: Self-pay

## 2023-09-22 DIAGNOSIS — R079 Chest pain, unspecified: Secondary | ICD-10-CM | POA: Diagnosis not present

## 2023-09-22 DIAGNOSIS — R918 Other nonspecific abnormal finding of lung field: Secondary | ICD-10-CM | POA: Diagnosis not present

## 2023-09-22 DIAGNOSIS — R0602 Shortness of breath: Secondary | ICD-10-CM | POA: Diagnosis not present

## 2023-09-22 DIAGNOSIS — R591 Generalized enlarged lymph nodes: Secondary | ICD-10-CM | POA: Diagnosis not present

## 2023-09-22 LAB — TROPONIN I (HIGH SENSITIVITY): Troponin I (High Sensitivity): 6 ng/L (ref <18)

## 2023-09-22 MED ORDER — IOHEXOL 350 MG/ML SOLN
75.0000 mL | Freq: Once | INTRAVENOUS | Status: AC | PRN
  Administered 2023-09-22: 75 mL via INTRAVENOUS

## 2023-09-22 MED ORDER — PREDNISONE 20 MG PO TABS
40.0000 mg | ORAL_TABLET | Freq: Every day | ORAL | 0 refills | Status: DC
  Filled 2023-09-22: qty 10, 5d supply, fill #0

## 2023-09-22 MED ORDER — DOXYCYCLINE HYCLATE 100 MG PO TABS
100.0000 mg | ORAL_TABLET | Freq: Two times a day (BID) | ORAL | 0 refills | Status: DC
  Filled 2023-09-22: qty 20, 10d supply, fill #0

## 2023-10-05 ENCOUNTER — Other Ambulatory Visit: Payer: Self-pay

## 2023-10-06 ENCOUNTER — Other Ambulatory Visit: Payer: Self-pay

## 2023-10-31 ENCOUNTER — Ambulatory Visit: Payer: 59 | Admitting: Family Medicine

## 2023-11-07 ENCOUNTER — Other Ambulatory Visit: Payer: Self-pay

## 2023-11-07 ENCOUNTER — Other Ambulatory Visit: Payer: Self-pay | Admitting: Family Medicine

## 2023-11-07 DIAGNOSIS — J4520 Mild intermittent asthma, uncomplicated: Secondary | ICD-10-CM

## 2023-11-07 MED ORDER — BUDESONIDE-FORMOTEROL FUMARATE 160-4.5 MCG/ACT IN AERO
2.0000 | INHALATION_SPRAY | Freq: Two times a day (BID) | RESPIRATORY_TRACT | 2 refills | Status: DC
  Filled 2023-11-07: qty 10.2, 30d supply, fill #0
  Filled 2023-12-12: qty 10.2, 30d supply, fill #1
  Filled 2024-01-26: qty 10.2, 30d supply, fill #2

## 2023-11-07 MED ORDER — ALBUTEROL SULFATE HFA 108 (90 BASE) MCG/ACT IN AERS
2.0000 | INHALATION_SPRAY | Freq: Four times a day (QID) | RESPIRATORY_TRACT | 2 refills | Status: DC | PRN
  Filled 2023-11-07: qty 6.7, 25d supply, fill #0
  Filled 2023-12-12: qty 6.7, 25d supply, fill #1
  Filled 2024-01-26: qty 6.7, 25d supply, fill #2

## 2023-11-08 ENCOUNTER — Other Ambulatory Visit: Payer: Self-pay

## 2023-11-09 ENCOUNTER — Other Ambulatory Visit: Payer: Self-pay

## 2023-12-12 ENCOUNTER — Other Ambulatory Visit: Payer: Self-pay

## 2023-12-28 ENCOUNTER — Ambulatory Visit: Attending: Family Medicine | Admitting: Family Medicine

## 2023-12-28 ENCOUNTER — Other Ambulatory Visit: Payer: Self-pay

## 2023-12-28 ENCOUNTER — Encounter: Payer: Self-pay | Admitting: Family Medicine

## 2023-12-28 VITALS — BP 110/74 | HR 91 | Ht 63.0 in | Wt 285.0 lb

## 2023-12-28 DIAGNOSIS — I10 Essential (primary) hypertension: Secondary | ICD-10-CM | POA: Diagnosis not present

## 2023-12-28 DIAGNOSIS — J4520 Mild intermittent asthma, uncomplicated: Secondary | ICD-10-CM | POA: Diagnosis not present

## 2023-12-28 DIAGNOSIS — K08109 Complete loss of teeth, unspecified cause, unspecified class: Secondary | ICD-10-CM | POA: Diagnosis not present

## 2023-12-28 DIAGNOSIS — R7303 Prediabetes: Secondary | ICD-10-CM | POA: Diagnosis not present

## 2023-12-28 DIAGNOSIS — M62838 Other muscle spasm: Secondary | ICD-10-CM | POA: Diagnosis not present

## 2023-12-28 LAB — POCT GLYCOSYLATED HEMOGLOBIN (HGB A1C): HbA1c, POC (controlled diabetic range): 6.3 % (ref 0.0–7.0)

## 2023-12-28 MED ORDER — AMLODIPINE BESYLATE 10 MG PO TABS
10.0000 mg | ORAL_TABLET | Freq: Every day | ORAL | 1 refills | Status: DC
  Filled 2023-12-28 – 2024-03-13 (×4): qty 90, 90d supply, fill #0
  Filled 2024-06-13: qty 90, 90d supply, fill #1

## 2023-12-28 MED ORDER — LOSARTAN POTASSIUM 100 MG PO TABS
100.0000 mg | ORAL_TABLET | Freq: Every day | ORAL | 1 refills | Status: DC
  Filled 2023-12-28 – 2024-03-13 (×4): qty 90, 90d supply, fill #0
  Filled 2024-06-13: qty 90, 90d supply, fill #1

## 2023-12-28 NOTE — Patient Instructions (Signed)
 VISIT SUMMARY:  Today, you came in for a follow-up visit to discuss your hypertension, asthma, prediabetes, and weight management. We also addressed your recent lower back pain and dental issues. Your blood pressure and asthma are well-controlled with your current medications. We discussed your weight loss goals and strategies to help you achieve them, as well as your dental health and its impact on your overall well-being.  YOUR PLAN:  -OBESITY: Obesity means having an excessive amount of body fat, which can lead to various health problems. We discussed the importance of weight loss for your overall health and joint health. You will be referred to a weight management clinic at [facility] or [facility]. We also talked about lifestyle changes such as setting an eating cutoff time, avoiding TV snacking, and choosing healthier snacks like yogurt and granola. Additionally, I recommend using the My Fitness Pal app to track your diet.  -LOWER BACK PAIN: Your lower back pain is likely due to a muscle spasm. To help relieve the pain, you can use an over-the-counter Lidodrome patch and a heating pad. I also suggest doing back exercises such as yoga to help with stretching.  -DENTAL ISSUES: You have multiple teeth that need to be extracted, which can affect your overall health and weight loss efforts. I will refer you to a dentist for an evaluation and extraction of the affected teeth.  -HYPERTENSION: Hypertension, or high blood pressure, is well-controlled with your current medications, amlodipine  and losartan . Please continue taking these medications as prescribed, and I will refill your prescriptions when they are due.  -ASTHMA: Asthma is a condition where your airways narrow and swell, making it difficult to breathe. Your asthma is well-managed with your current inhalers, albuterol  and Symbicort . Please continue using these inhalers as prescribed, and I will refill your prescriptions when they are  due.  INSTRUCTIONS:  Please follow up with the weight management clinic at [facility] or [facility] as discussed. Additionally, schedule an appointment with a dentist for the evaluation and extraction of your teeth. Continue taking your hypertension and asthma medications as prescribed, and use the recommended methods for managing your lower back pain. If you have any questions or concerns, please do not hesitate to contact our office.

## 2023-12-28 NOTE — Progress Notes (Signed)
 Subjective:  Patient ID: Anne Schaefer, female    DOB: [birth year]  Age: 53 y.o. MRN: [id number]  CC: Medical Management of Chronic Issues (Discuss weight loss)     Discussed the use of AI scribe software for clinical note transcription with the patient, who gave verbal consent to proceed.  History of Present Illness Anne Schaefer is a 53 year old female with hypertension, asthma, and prediabetes who presents for follow-up and weight management consultation.  Her hypertension is well-controlled with amlodipine  and losartan  after previous non-adherence. Asthma is managed with albuterol  and Symbicort . Her A1c has improved slightly to 6.3 down from 6.4.    She is focused on weight loss and is gradually resuming exercise, aiming to fit it around her 12-hour night shifts . She is open to dietary changes, reducing starches, and increasing fruits and vegetables, while avoiding sweet foods which she states she does not ingest much of.  She has dental issues requiring extractions, which are delayed due to cost despite having insurance. She seeks a referral for extractions, particularly in the front teeth, as she feels this affects her diet management.  She experiences a recent onset of right-sided lower back pain described as a 'twinge' with certain movements, without radiation to the hip or buttock. This may be related to frequent sitting and standing at work.  She continues to cope with grief following the loss of her fianc two years ago.    Past Medical History:  Diagnosis Date   Allergy    Anemia    on Iron    Asthma    Diverticulitis    Headache    migraine in 2015   Hematoma 01/10/2015   History of blood transfusion    Hypertension    Infection    trich   Obesity    Ovarian cyst    Pneumonia    had 2 times 1997 and 2012   Rectus sheath hematoma s/p hysterectomy 12/31/14 01/12/2015   Seasonal allergies     Past Surgical History:  Procedure Laterality Date   ABDOMINAL  HYSTERECTOMY N/A 12/31/2014   Procedure: HYSTERECTOMY ABDOMINAL;  Surgeon: Tresia Fruit, MD;  Location: [facility];  Service: Gynecology;  Laterality: N/A;   CESAREAN SECTION     FOREHEAD RECONSTRUCTION     as a child   SALPINGOOPHORECTOMY Right 12/31/2014   Procedure: RIGHT SALPINGO OOPHORECTOMY;  Surgeon: Tresia Fruit, MD;  Location: [facility];  Service: Gynecology;  Laterality: Right;   TUBAL LIGATION     WISDOM TOOTH EXTRACTION      Family History  Problem Relation Age of Onset   Diabetes Mother    Heart disease Mother        chf   Hypertension Father    Colon cancer Neg Hx    Colon polyps Neg Hx    Esophageal cancer Neg Hx    Prostate cancer Neg Hx    Rectal cancer Neg Hx     Social History   Socioeconomic History   Marital status: Single    Spouse name: Not on file   Number of children: Not on file   Years of education: Not on file   Highest education level: Not on file  Occupational History   Not on file  Tobacco Use   Smoking status: Every Day    Current packs/day: 0.25    Average packs/day: 0.3 packs/day for 8.0 years (2.0 ttl pk-yrs)    Types: Cigarettes   Smokeless tobacco: Never  Tobacco comments:    Smoke 2 packs during week ; 5 cigs a day  Vaping Use   Vaping status: Never Used  Substance and Sexual Activity   Alcohol use: Yes    Comment: occ   Drug use: Yes    Types: Marijuana    Comment: occasionally   Sexual activity: Not Currently    Birth control/protection: Surgical  Other Topics Concern   Not on file  Social History Narrative   Not on file   Social Drivers of Health   Financial Resource Strain: Not on file  Food Insecurity: No Food Insecurity (08/12/2022)   Hunger Vital Sign    Worried About Running Out of Food in the Last Year: Never true    Ran Out of Food in the Last Year: Never true  Transportation Needs: Not on file  Physical Activity: Not on file  Stress: Not on file  Social Connections: Unknown (01/22/2022)   Received from  [facility]   Social Network    Social Network: Not on file    Allergies  Allergen Reactions   Ibuprofen Shortness Of Breath   Azithromycin Hives   Other Itching and Rash    Pt states that she is allergic to [employer].        Outpatient Medications Prior to Visit  Medication Sig Dispense Refill   albuterol  (PROVENTIL ) (2.5 MG/3ML) 0.083% nebulizer solution Take 3 mLs (2.5 mg total) by nebulization every 6 (six) hours as needed for wheezing or shortness of breath. 75 mL 12   albuterol  (VENTOLIN  HFA) 108 (90 Base) MCG/ACT inhaler Inhale 2 puffs into the lungs every 6 (six) hours as needed for wheezing or shortness of breath. 6.7 g 2   budesonide -formoterol  (SYMBICORT ) 160-4.5 MCG/ACT inhaler Inhale 2 puffs into the lungs 2 (two) times daily. 10.2 g 2   cyclobenzaprine  (FLEXERIL ) 10 MG tablet Take 1 tablet (10 mg total) by mouth 3 (three) times daily as needed for muscle spasms. 60 tablet 1   amLODipine  (NORVASC ) 10 MG tablet Take 1 tablet (10 mg total) by mouth daily. 90 tablet 1   losartan  (COZAAR ) 100 MG tablet Take 1 tablet (100 mg total) by mouth daily. 90 tablet 1   doxycycline  (VIBRA -TABS) 100 MG tablet Take 1 tablet (100 mg total) by mouth 2 (two) times daily. 20 tablet 0   predniSONE  (DELTASONE ) 20 MG tablet Take 2 tablets (40 mg total) by mouth daily with breakfast. 10 tablet 0   No facility-administered medications prior to visit.     ROS Review of Systems  Constitutional:  Negative for activity change and appetite change.  HENT:  Positive for dental problem. Negative for sinus pressure and sore throat.   Respiratory:  Negative for chest tightness, shortness of breath and wheezing.   Cardiovascular:  Negative for chest pain and palpitations.  Gastrointestinal:  Negative for abdominal distention, abdominal pain and constipation.  Genitourinary: Negative.   Musculoskeletal:  Positive for back pain.  Psychiatric/Behavioral:  Negative for behavioral problems and  dysphoric mood.     Objective:  BP 110/74   Pulse 91   Ht 5' 3 (1.6 m)   Wt 285 lb (129.3 kg)   LMP 12/05/2014   SpO2 97%   BMI 50.49 kg/m      12/28/2023    2:24 PM 09/22/2023    2:00 AM 09/22/2023    1:00 AM  BP/Weight  Systolic BP 110 125 102  Diastolic BP 74 94 57  Wt. (Lbs) 285  BMI 50.49 kg/m2        Physical Exam Constitutional:      Appearance: She is well-developed.  HENT:     Mouth/Throat:     Comments: Loss of multiple teeth  Cardiovascular:     Rate and Rhythm: Normal rate.     Heart sounds: Normal heart sounds. No murmur heard. Pulmonary:     Effort: Pulmonary effort is normal.     Breath sounds: Normal breath sounds. No wheezing or rales.  Chest:     Chest wall: No tenderness.  Abdominal:     General: Bowel sounds are normal. There is no distension.     Palpations: Abdomen is soft. There is no mass.     Tenderness: There is no abdominal tenderness.   Musculoskeletal:        General: Normal range of motion.     Right lower leg: No edema.     Left lower leg: No edema.     Comments: Slight tenderness on palpation of right thoracolumbar region Negative straight leg raise bilaterally   Neurological:     Mental Status: She is alert and oriented to person, place, and time.   Psychiatric:        Mood and Affect: Mood normal.        Latest Ref Rng & Units 09/21/2023    5:30 PM 03/09/2023    2:20 PM 11/26/2022    9:32 AM  CMP  Glucose 70 - 99 mg/dL 130  865  784   BUN 6 - 20 mg/dL 15  21  9    Creatinine 0.44 - 1.00 mg/dL 6.96  2.95  2.84   Sodium 135 - 145 mmol/L 138  141  138   Potassium 3.5 - 5.1 mmol/L 3.9  4.2  3.8   Chloride 98 - 111 mmol/L 101  103  101   CO2 22 - 32 mmol/L 27  22  27    Calcium 8.9 - 10.3 mg/dL 9.5  9.5  9.4   Total Protein 6.0 - 8.5 g/dL  7.2    Total Bilirubin 0.0 - 1.2 mg/dL  0.2    Alkaline Phos 44 - 121 IU/L  82    AST 0 - 40 IU/L  23    ALT 0 - 32 IU/L  22      Lipid Panel     Component Value Date/Time    CHOL 156 06/08/2022 1551   TRIG 125 06/08/2022 1551   HDL 40 06/08/2022 1551   CHOLHDL 3.9 06/08/2022 1551   CHOLHDL 4.0 09/03/2015 1136   VLDL 21 09/03/2015 1136   LDLCALC 94 06/08/2022 1551    CBC    Component Value Date/Time   WBC 10.1 09/21/2023 1730   RBC 4.79 09/21/2023 1730   HGB 13.6 09/21/2023 1730   HGB 13.6 03/09/2023 1420   HCT 42.8 09/21/2023 1730   HCT 40.5 03/09/2023 1420   PLT 305 09/21/2023 1730   PLT 288 03/09/2023 1420   MCV 89.4 09/21/2023 1730   MCV 87 03/09/2023 1420   MCH 28.4 09/21/2023 1730   MCHC 31.8 09/21/2023 1730   RDW 14.0 09/21/2023 1730   RDW 13.2 03/09/2023 1420   LYMPHSABS 3.3 09/21/2023 1730   LYMPHSABS 3.5 (H) 03/09/2023 1420   MONOABS 0.7 09/21/2023 1730   EOSABS 0.1 09/21/2023 1730   EOSABS 0.2 03/09/2023 1420   BASOSABS 0.0 09/21/2023 1730   BASOSABS 0.0 03/09/2023 1420    Lab Results  Component Value Date  HGBA1C 6.3 12/28/2023    Lab Results  Component Value Date   HGBA1C 6.3 12/28/2023   HGBA1C 6.4 06/30/2023   HGBA1C 6.4 (H) 06/08/2022      1. Prediabetes (Primary) Labs reveal prediabetes with an A1c of 6.3.  An A1c of 6.5 is supportive of a diagnosis of type 2 diabetes mellitus.  Working on a low carbohydrate diet, exercise, weight loss is recommended in order to prevent progression to type 2 diabetes mellitus.  - POCT glycosylated hemoglobin (Hb A1C)  2. Primary hypertension Controlled Continue current regimen Counseled on blood pressure goal of less than 130/80, low-sodium, DASH diet, medication compliance, 150 minutes of moderate intensity exercise per week. Discussed medication compliance, adverse effects. - amLODipine  (NORVASC ) 10 MG tablet; Take 1 tablet (10 mg total) by mouth daily.  Dispense: 90 tablet; Refill: 1 - losartan  (COZAAR ) 100 MG tablet; Take 1 tablet (100 mg total) by mouth daily.  Dispense: 90 tablet; Refill: 1  3. Morbid obesity due to excess calories (HCC) Counseled on caloric  restriction, avoiding late meals - Amb Ref to Medical Weight Management  4. Muscle spasm Advised to obtain Lidoderm  patches  5. Loss of teeth - Ambulatory referral to Dentistry  6. Asthma Controlled Continue Symbicort  and albuterol    Meds ordered this encounter  Medications   amLODipine  (NORVASC ) 10 MG tablet    Sig: Take 1 tablet (10 mg total) by mouth daily.    Dispense:  90 tablet    Refill:  1    Refill not needed at this time   losartan  (COZAAR ) 100 MG tablet    Sig: Take 1 tablet (100 mg total) by mouth daily.    Dispense:  90 tablet    Refill:  1    Refill not needed at this time    Follow-up: Return in about 6 months (around 06/28/2024) for Chronic medical conditions.       Joaquin Mulberry, MD, FAAFP. [facility] [place], [place] [phone]   12/28/2023, 6:15 PM

## 2024-01-03 ENCOUNTER — Other Ambulatory Visit: Payer: Self-pay

## 2024-01-03 ENCOUNTER — Encounter (HOSPITAL_COMMUNITY): Payer: Self-pay

## 2024-01-03 ENCOUNTER — Ambulatory Visit (HOSPITAL_COMMUNITY)
Admission: EM | Admit: 2024-01-03 | Discharge: 2024-01-03 | Disposition: A | Discharge status: Home / Self Care | Attending: Emergency Medicine | Admitting: Emergency Medicine

## 2024-01-03 DIAGNOSIS — R519 Headache, unspecified: Secondary | ICD-10-CM

## 2024-01-03 DIAGNOSIS — R220 Localized swelling, mass and lump, head: Secondary | ICD-10-CM | POA: Diagnosis not present

## 2024-01-03 DIAGNOSIS — J0100 Acute maxillary sinusitis, unspecified: Secondary | ICD-10-CM

## 2024-01-03 MED ORDER — DEXAMETHASONE SODIUM PHOSPHATE 10 MG/ML IJ SOLN
INTRAMUSCULAR | Status: AC
  Filled 2024-01-03: qty 1

## 2024-01-03 MED ORDER — AMOXICILLIN-POT CLAVULANATE 875-125 MG PO TABS
1.0000 | ORAL_TABLET | Freq: Two times a day (BID) | ORAL | 0 refills | Status: AC
  Filled 2024-01-03: qty 14, 7d supply, fill #0

## 2024-01-03 MED ORDER — DEXAMETHASONE SODIUM PHOSPHATE 10 MG/ML IJ SOLN
10.0000 mg | Freq: Once | INTRAMUSCULAR | Status: AC
  Administered 2024-01-03: 10 mg via INTRAMUSCULAR

## 2024-01-03 NOTE — Discharge Instructions (Addendum)
 The steroid injection can start to work in about 30 minutes to reduce swelling and pain. It is a long acting medicine so will continue to work for many hours.  Take the antibiotic Augmentin  as directed. Twice daily for 7 days in a row. Always take with food.  Please go to the emergency department if symptoms worsen or become severe

## 2024-01-03 NOTE — ED Provider Notes (Signed)
 [facility]    CSN: [id number] Arrival date & time: 01/03/24  1117      History   Chief Complaint Chief Complaint  Patient presents with   Dental Pain    HPI Anne Schaefer is a 53 y.o. female.  2 day history of left sided facial pain and swelling. Tender to light touch. Pain is in the forehead, cheek, ear, mouth, and chin.  She denies any trauma or injury No fevers Maybe some congestion Pain is not dental but she has soreness of the teeth due to swelling  Has taken tylenol    Prediabetes. 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   Lung nodule 09/03/2015   S/P abdominal supracervical hysterectomy and right salpingo-oophorectomy on 12/31/14 01/12/2015   Smoking 03/08/2014   Essential hypertension, benign 03/08/2014   Dermoid cyst of ovary 05/21/2013   Hidradenitis suppurativa 04/09/2012    Past Surgical History:  Procedure Laterality Date   ABDOMINAL HYSTERECTOMY N/A 12/31/2014   Procedure: HYSTERECTOMY ABDOMINAL;  Surgeon: Lynwood KANDICE Solomons, MD;  Location: [facility];  Service: Gynecology;  Laterality: N/A;   CESAREAN SECTION     FOREHEAD RECONSTRUCTION     as a child   SALPINGOOPHORECTOMY Right 12/31/2014   Procedure: RIGHT SALPINGO OOPHORECTOMY;  Surgeon: Lynwood KANDICE Solomons, MD;  Location: [facility];  Service: Gynecology;  Laterality: Right;   TUBAL LIGATION     WISDOM TOOTH EXTRACTION      OB History     Gravida  5   Para  5    Term  4   Preterm  1   AB  0   Living  5      SAB  0   IAB  0   Ectopic  0   Multiple  0   Live Births  1            Home Medications    Prior to Admission medications   Medication Sig Start Date End Date Taking? Authorizing Provider  amoxicillin -clavulanate (AUGMENTIN ) 875-125 MG tablet Take 1 tablet by mouth every 12 (twelve) hours for 7 days. 01/03/24 01/10/24 Yes Datra Clary, Asberry, PA-C  albuterol  (PROVENTIL ) (2.5 MG/3ML) 0.083% nebulizer solution Take 3 mLs (2.5 mg total) by nebulization every 6 (six) hours as needed for wheezing or shortness of breath. 06/08/22   Brien Belvie BRAVO, MD  albuterol  (VENTOLIN  HFA) 108 (90 Base) MCG/ACT inhaler Inhale 2 puffs into the lungs every 6 (six) hours as needed for wheezing or shortness of breath. 11/07/23   Newlin, Enobong, MD  amLODipine  (NORVASC ) 10 MG tablet Take 1 tablet (10 mg total) by mouth daily. 12/28/23   Newlin, Enobong, MD  budesonide -formoterol  (SYMBICORT ) 160-4.5 MCG/ACT inhaler Inhale 2 puffs into the lungs 2 (two) times daily. 11/07/23   Newlin, Enobong, MD  cyclobenzaprine  (FLEXERIL ) 10 MG tablet Take 1 tablet (10 mg total) by mouth 3 (three) times daily as needed for muscle spasms. 03/09/23   Fleming, Zelda W, NP  losartan  (COZAAR ) 100 MG tablet Take 1 tablet (100 mg total) by mouth daily. 12/28/23   Delbert Clam, MD    Family History Family History  Problem Relation Age of Onset   Diabetes Mother    Heart disease Mother        chf   Hypertension Father    Colon cancer Neg Hx    Colon polyps Neg Hx    Esophageal cancer Neg Hx    Prostate cancer Neg Hx    Rectal cancer Neg Hx     Social History Social History   Tobacco Use   Smoking status: Every Day    Current packs/day: 0.25    Average packs/day: 0.3 packs/day for 8.0 years (2.0 ttl pk-yrs)    Types: Cigarettes   Smokeless tobacco: Never   Tobacco comments:    Smoke 2 packs during week ; 5 cigs a day  Vaping Use   Vaping status: Never Used   Substance Use Topics   Alcohol use: Yes    Comment: occ   Drug use: Yes    Types: Marijuana    Comment: occasionally     Allergies   Ibuprofen, Azithromycin, and Other   Review of Systems Review of Systems As per HPI  Physical Exam Triage Vital Signs ED Triage Vitals [01/03/24 1128]  Encounter Vitals Group     BP (!) 147/75     Girls Systolic BP Percentile      Girls Diastolic BP Percentile      Boys Systolic BP Percentile      Boys Diastolic BP Percentile      Pulse Rate 95     Resp 18     Temp 98.7 F (37.1 C)     Temp Source Oral     SpO2 95 %     Weight      Height      Head Circumference      Peak Flow      Pain Score 10     Pain Loc      Pain Education      Exclude from Growth Chart    No data found.  Updated Vital Signs BP (!) 147/75 (BP Location: Left Arm)   Pulse 95   Temp 98.7 F (37.1 C) (Oral)   Resp 18   LMP 12/05/2014   SpO2 95%   Visual Acuity Right Eye Distance:   Left Eye Distance:   Bilateral Distance:    Right Eye Near:   Left Eye Near:    Bilateral Near:     Physical Exam Vitals and nursing note reviewed.  Constitutional:      General: She is not in acute distress.    Appearance: Normal appearance.  HENT:     Head:     Jaw: There is normal jaw occlusion.      Comments: Mild facial swelling left maxillary area. The left side face is tender to light palpation.    Right Ear: Tympanic membrane and ear canal normal.     Left Ear: Tympanic membrane and ear canal normal.     Nose:     Left Sinus: Maxillary sinus tenderness and frontal sinus tenderness present.     Mouth/Throat:     Dentition: Abnormal dentition. Dental caries present. No dental tenderness.  Pharynx: Oropharynx is clear.   Cardiovascular:     Rate and Rhythm: Normal rate and regular rhythm.     Pulses: Normal pulses.     Heart sounds: Normal heart sounds.  Pulmonary:     Effort: Pulmonary effort is normal.     Breath sounds: Normal breath sounds.   Abdominal:     Palpations: Abdomen is soft.   Musculoskeletal:     Cervical back: Normal range of motion. No rigidity or tenderness.  Lymphadenopathy:     Cervical: No cervical adenopathy.   Neurological:     Mental Status: She is alert and oriented to person, place, and time.      UC Treatments / Results  Labs (all labs ordered are listed, but only abnormal results are displayed) Labs Reviewed - No data to display  EKG   Radiology No results found.  Procedures Procedures (including critical care time)  Medications Ordered in UC Medications  dexamethasone  (DECADRON ) injection 10 mg (10 mg Intramuscular Given 01/03/24 1215)    Initial Impression / Assessment and Plan / UC Course  I have reviewed the triage vital signs and the nursing notes.  Pertinent labs & imaging results that were available during my care of the patient were reviewed by me and considered in my medical decision making (see chart for details).  Afebrile in clinic Minimal facial swelling at this time, left sided. Patient reports it improved this morning. She has tenderness to touch over left face including sinuses. Suspect either facial abscess or sinusitis, less likely dental although will treat with augmentin  so will cover all considerations. Augmentin  BID x 7 days. IM decadron  given in clinic for swelling and pain as well Strict return and ED precautions are verbalized. Patient agreeable to plan, all questions answered   Final Clinical Impressions(s) / UC Diagnoses   Final diagnoses:  Acute non-recurrent maxillary sinusitis  Facial pain  Left facial swelling  Left-sided headache     Discharge Instructions      The steroid injection can start to work in about 30 minutes to reduce swelling and pain. It is a long acting medicine so will continue to work for many hours.  Take the antibiotic Augmentin  as directed. Twice daily for 7 days in a row. Always take with food.  Please go to the  emergency department if symptoms worsen or become severe     ED Prescriptions     Medication Sig Dispense Auth. Provider   amoxicillin -clavulanate (AUGMENTIN ) 875-125 MG tablet Take 1 tablet by mouth every 12 (twelve) hours for 7 days. 14 tablet Laqueisha Catalina, Asberry, PA-C      PDMP not reviewed this encounter.   Bekka Qian, Asberry, PA-C 01/03/24 1245

## 2024-01-03 NOTE — ED Triage Notes (Signed)
 Pt c/o lt lower facial swelling with pain to lt face and ear x2 days. Took tylenol  with no relief.

## 2024-01-05 ENCOUNTER — Encounter (INDEPENDENT_AMBULATORY_CARE_PROVIDER_SITE_OTHER): Payer: Self-pay

## 2024-01-26 ENCOUNTER — Other Ambulatory Visit: Payer: Self-pay

## 2024-02-23 ENCOUNTER — Other Ambulatory Visit: Payer: Self-pay | Admitting: Family Medicine

## 2024-02-23 DIAGNOSIS — J4520 Mild intermittent asthma, uncomplicated: Secondary | ICD-10-CM

## 2024-02-24 ENCOUNTER — Other Ambulatory Visit (HOSPITAL_COMMUNITY): Payer: Self-pay

## 2024-02-24 ENCOUNTER — Other Ambulatory Visit: Payer: Self-pay

## 2024-02-24 MED ORDER — ALBUTEROL SULFATE HFA 108 (90 BASE) MCG/ACT IN AERS
2.0000 | INHALATION_SPRAY | Freq: Four times a day (QID) | RESPIRATORY_TRACT | 2 refills | Status: DC | PRN
  Filled 2024-02-24 (×2): qty 6.7, 25d supply, fill #0

## 2024-02-24 MED ORDER — BUDESONIDE-FORMOTEROL FUMARATE 160-4.5 MCG/ACT IN AERO
2.0000 | INHALATION_SPRAY | Freq: Two times a day (BID) | RESPIRATORY_TRACT | 2 refills | Status: DC
  Filled 2024-02-24 (×2): qty 10.2, 30d supply, fill #0

## 2024-03-12 ENCOUNTER — Emergency Department (HOSPITAL_COMMUNITY): Payer: Self-pay

## 2024-03-12 ENCOUNTER — Emergency Department (HOSPITAL_COMMUNITY)
Admission: EM | Admit: 2024-03-12 | Discharge: 2024-03-12 | Disposition: A | Discharge status: Home / Self Care | Payer: Self-pay | Attending: Emergency Medicine | Admitting: Emergency Medicine

## 2024-03-12 ENCOUNTER — Encounter (HOSPITAL_COMMUNITY): Payer: Self-pay | Admitting: Emergency Medicine

## 2024-03-12 DIAGNOSIS — J4531 Mild persistent asthma with (acute) exacerbation: Secondary | ICD-10-CM

## 2024-03-12 DIAGNOSIS — R0682 Tachypnea, not elsewhere classified: Secondary | ICD-10-CM | POA: Insufficient documentation

## 2024-03-12 DIAGNOSIS — R062 Wheezing: Secondary | ICD-10-CM | POA: Insufficient documentation

## 2024-03-12 DIAGNOSIS — R0602 Shortness of breath: Secondary | ICD-10-CM | POA: Diagnosis present

## 2024-03-12 DIAGNOSIS — J4520 Mild intermittent asthma, uncomplicated: Secondary | ICD-10-CM

## 2024-03-12 LAB — CBC
HCT: 42.8 % (ref 36.0–46.0)
Hemoglobin: 13.7 g/dL (ref 12.0–15.0)
MCH: 28.1 pg (ref 26.0–34.0)
MCHC: 32 g/dL (ref 30.0–36.0)
MCV: 87.9 fL (ref 80.0–100.0)
Platelets: 304 K/uL (ref 150–400)
RBC: 4.87 MIL/uL (ref 3.87–5.11)
RDW: 14 % (ref 11.5–15.5)
WBC: 7 K/uL (ref 4.0–10.5)
nRBC: 0 % (ref 0.0–0.2)

## 2024-03-12 LAB — BASIC METABOLIC PANEL WITH GFR
Anion gap: 13 (ref 5–15)
BUN: 7 mg/dL (ref 6–20)
CO2: 24 mmol/L (ref 22–32)
Calcium: 9.2 mg/dL (ref 8.9–10.3)
Chloride: 104 mmol/L (ref 98–111)
Creatinine, Ser: 0.88 mg/dL (ref 0.44–1.00)
GFR, Estimated: >60 mL/min (ref >60)
Glucose, Bld: 133 mg/dL — ABNORMAL HIGH (ref 70–99)
Potassium: 4.1 mmol/L (ref 3.5–5.1)
Sodium: 141 mmol/L (ref 135–145)

## 2024-03-12 LAB — TROPONIN I (HIGH SENSITIVITY)
Troponin I (High Sensitivity): 6 ng/L (ref <18)
Troponin I (High Sensitivity): 5 ng/L (ref <18)

## 2024-03-12 MED ORDER — ALBUTEROL SULFATE (2.5 MG/3ML) 0.083% IN NEBU
2.5000 mg | INHALATION_SOLUTION | Freq: Once | RESPIRATORY_TRACT | Status: AC
  Administered 2024-03-12: 2.5 mg via RESPIRATORY_TRACT
  Filled 2024-03-12: qty 3

## 2024-03-12 MED ORDER — BUDESONIDE-FORMOTEROL FUMARATE 160-4.5 MCG/ACT IN AERO
2.0000 | INHALATION_SPRAY | Freq: Two times a day (BID) | RESPIRATORY_TRACT | 2 refills | Status: DC
  Filled 2024-03-12 – 2024-03-13 (×3): qty 10.2, 30d supply, fill #0
  Filled 2024-04-16 (×2): qty 10.2, 30d supply, fill #1
  Filled 2024-05-18 (×2): qty 10.2, 30d supply, fill #2

## 2024-03-12 MED ORDER — ALBUTEROL SULFATE (2.5 MG/3ML) 0.083% IN NEBU
2.5000 mg | INHALATION_SOLUTION | Freq: Four times a day (QID) | RESPIRATORY_TRACT | 12 refills | Status: AC | PRN
  Filled 2024-03-12 – 2024-03-13 (×3): qty 75, 7d supply, fill #0

## 2024-03-12 MED ORDER — ALBUTEROL SULFATE HFA 108 (90 BASE) MCG/ACT IN AERS
2.0000 | INHALATION_SPRAY | Freq: Four times a day (QID) | RESPIRATORY_TRACT | 2 refills | Status: DC | PRN
  Filled 2024-03-12 – 2024-03-13 (×3): qty 6.7, 25d supply, fill #0
  Filled 2024-04-16 (×2): qty 6.7, 25d supply, fill #1
  Filled 2024-05-18 (×2): qty 6.7, 25d supply, fill #2

## 2024-03-12 MED ORDER — HYDROCODONE-ACETAMINOPHEN 5-325 MG PO TABS
1.0000 | ORAL_TABLET | Freq: Once | ORAL | Status: AC
  Administered 2024-03-12: 1 via ORAL
  Filled 2024-03-12: qty 1

## 2024-03-12 MED ORDER — PREDNISONE 20 MG PO TABS
60.0000 mg | ORAL_TABLET | Freq: Once | ORAL | Status: AC
  Administered 2024-03-12: 60 mg via ORAL
  Filled 2024-03-12: qty 3

## 2024-03-12 MED ORDER — METHYLPREDNISOLONE 4 MG PO TBPK
ORAL_TABLET | ORAL | 0 refills | Status: AC
  Filled 2024-03-12: qty 21, fill #0
  Filled 2024-03-13 (×2): qty 21, 6d supply, fill #0

## 2024-03-12 NOTE — ED Notes (Signed)
..  The patient is A&OX4, ambulatory at d/c with independent steady gait, NAD. Pt verbalized understanding of d/c instructions, prescriptions and follow up care.

## 2024-03-12 NOTE — ED Triage Notes (Signed)
 Pt here form home with c/o some sob and chest tightness , pt ran out of her inhaler about 1 week ago , pt still smokes but has cut back

## 2024-03-12 NOTE — Discharge Instructions (Addendum)
 You have been seen and discharged from the emergency department.  Your nebulizers and inhaler have been refilled.  Take the steroid Dosepak starting tomorrow 9/2 as prescribed.  Follow-up with your primary provider for further evaluation and further care. Take home medications as prescribed. If you have any worsening symptoms or further concerns for your health please return to an emergency department for further evaluation.

## 2024-03-12 NOTE — ED Triage Notes (Signed)
 Pt c.o asthma exacerbation with chest tightness and sob. States she is out of her steroid inhaler

## 2024-03-12 NOTE — ED Provider Notes (Signed)
 [place] [facility] AT [facility] Provider Note   CSN: [id number] Arrival date & time: 03/12/24  1454     Patient presents with: Chest Pain and Shortness of Breath   Anne Schaefer is a 53 y.o. female.   HPI   53 year old female presents to the emergency department with concern for chest tightness, shortness of breath and wheezing.  She has been without her Symbicort  inhaler for about a week now.  She is also sensitive to environmental allergies and pollen.  She states for the past week she has been having wheezing, intermittently productive cough.  She denies any fever, hemoptysis, unilateral leg swelling.  States that her current symptoms feel similar to previous asthma exacerbations.  Denies any sharp chest or back pain.  Prior to Admission medications   Medication Sig Start Date End Date Taking? Authorizing Provider  albuterol  (PROVENTIL ) (2.5 MG/3ML) 0.083% nebulizer solution Take 3 mLs (2.5 mg total) by nebulization every 6 (six) hours as needed for wheezing or shortness of breath. 06/08/22   Brien Belvie BRAVO, MD  albuterol  (VENTOLIN  HFA) 108 [phone] Base) MCG/ACT inhaler Inhale 2 puffs into the lungs every 6 (six) hours as needed for wheezing or shortness of breath. 02/24/24   Newlin, Enobong, MD  amLODipine  (NORVASC ) 10 MG tablet Take 1 tablet (10 mg total) by mouth daily. 12/28/23   Newlin, Enobong, MD  budesonide -formoterol  (SYMBICORT ) 160-4.5 MCG/ACT inhaler Inhale 2 puffs into the lungs 2 (two) times daily. 02/24/24   Newlin, Enobong, MD  cyclobenzaprine  (FLEXERIL ) 10 MG tablet Take 1 tablet (10 mg total) by mouth 3 (three) times daily as needed for muscle spasms. 03/09/23   Fleming, Zelda W, NP  losartan  (COZAAR ) 100 MG tablet Take 1 tablet (100 mg total) by mouth daily. 12/28/23   Newlin, Enobong, MD    Allergies: Ibuprofen, Azithromycin, and Other    Review of Systems  Constitutional:  Positive for fatigue. Negative for fever.  Respiratory:  Positive for  cough, chest tightness, shortness of breath and wheezing.   Cardiovascular:  Negative for chest pain, palpitations and leg swelling.  Gastrointestinal:  Negative for abdominal pain, diarrhea and vomiting.  Skin:  Negative for rash.  Neurological:  Negative for headaches.    Updated Vital Signs BP 133/82 (BP Location: Right Arm)   Pulse (!) 109   Temp 98.4 F (36.9 C)   Resp 20   LMP 12/05/2014   SpO2 96%   Physical Exam Vitals and nursing note reviewed.  Constitutional:      General: She is not in acute distress.    Appearance: Normal appearance.  HENT:     Head: Normocephalic.     Mouth/Throat:     Mouth: Mucous membranes are moist.  Cardiovascular:     Rate and Rhythm: Normal rate.  Pulmonary:     Effort: Tachypnea present.     Breath sounds: Examination of the right-lower field reveals decreased breath sounds. Examination of the left-lower field reveals decreased breath sounds. Decreased breath sounds and wheezing present.  Abdominal:     Palpations: Abdomen is soft.     Tenderness: There is no abdominal tenderness.  Skin:    General: Skin is warm.  Neurological:     Mental Status: She is alert and oriented to person, place, and time. Mental status is at baseline.  Psychiatric:        Mood and Affect: Mood normal.     (all labs ordered are listed, but only abnormal results are displayed)  Labs Reviewed  CBC  BASIC METABOLIC PANEL WITH GFR  TROPONIN I (HIGH SENSITIVITY)    EKG: EKG Interpretation Date/Time:  Monday March 12 2024 15:02:56 EDT Ventricular Rate:  105 PR Interval:  152 QRS Duration:  80 QT Interval:  380 QTC Calculation: 502 R Axis:   94  Text Interpretation: Sinus tachycardia Rightward axis Borderline ECG When compared with ECG of 21-Sep-2023 16:55, PREVIOUS ECG IS PRESENT similar to previous Confirmed by Bari Flank [id number]) on 03/12/2024 3:35:20 PM  Radiology: DG Chest 2 View Result Date: 03/12/2024 EXAM: 2 VIEW(S) XRAY OF THE CHEST  03/12/2024 03:44:00 PM COMPARISON: 09/21/2023 CLINICAL HISTORY: cp/sob. Table formatting from the original note was not included. Images from the original note were not included. Reason for exam: Chest Pain; Shortness of Breath. Pt c.o asthma exacerbation with chest tightness and sob. States she is out of her steroid inhaler. FINDINGS: LUNGS AND PLEURA: Linear scarring in left lung base. Changes of COPD are noted. No focal pulmonary opacity. No pulmonary edema. No pleural effusion. No pneumothorax. HEART AND MEDIASTINUM: No acute abnormality of the cardiac and mediastinal silhouettes. BONES AND SOFT TISSUES: No acute osseous abnormality. IMPRESSION: 1. No acute findings. 2. Linear scarring in left lung base and changes of COPD. Electronically signed by: Lonni Necessary MD 03/12/2024 04:08 PM EDT RP Workstation: [id number]     Procedures   Medications Ordered in the ED  albuterol  (PROVENTIL ) (2.5 MG/3ML) 0.083% nebulizer solution 2.5 mg (2.5 mg Nebulization Given 03/12/24 1621)  HYDROcodone -acetaminophen  (NORCO/VICODIN) 5-325 MG per tablet 1 tablet (1 tablet Oral Given 03/12/24 1621)  predniSONE  (DELTASONE ) tablet 60 mg (60 mg Oral Given 03/12/24 1621)                                    Medical Decision Making Amount and/or Complexity of Data Reviewed Labs: ordered. Radiology: ordered.  Risk Prescription drug management.   53 year old female presents to the emergency department with chest tightness, cough and wheezing after being without her Symbicort  inhaler for 1 week.  Patient also states that she is sensitive to environmental changes/allergies.  Denies any fever, hemoptysis, leg swelling.  Was tachycardic in triage but normal heart rate on my evaluation.  She has diffuse wheezing with decreased breath sounds at the bases.  No hypoxia.  Slight increase work of breathing but no acute distress.  EKG has no ischemic changes.  Chest x-ray looks stable.  Blood work is reassuring, troponins are  negative.  Low suspicion for PE at this time given lung auscultation, current presentation.  After breathing treatment, dose of steroids patient feels significantly better.  On reevaluation wheezing is improved and almost resolved.  She speaking in full sentences, no longer has any chest discomfort.  Overall feels much improved and is asking to be discharged home.  Will refill her home medications and continue on steroid taper.  Patient at this time appears safe and stable for discharge and close outpatient follow up. Discharge plan and strict return to ED precautions discussed, patient verbalizes understanding and agreement.     Final diagnoses:  None    ED Discharge Orders     None          Bari Flank HERO, DO 03/12/24 2106

## 2024-03-13 ENCOUNTER — Other Ambulatory Visit: Payer: Self-pay

## 2024-03-13 ENCOUNTER — Other Ambulatory Visit (HOSPITAL_COMMUNITY): Payer: Self-pay

## 2024-03-14 ENCOUNTER — Other Ambulatory Visit: Payer: Self-pay

## 2024-04-16 ENCOUNTER — Other Ambulatory Visit: Payer: Self-pay

## 2024-05-14 ENCOUNTER — Encounter: Payer: Self-pay | Admitting: Radiology

## 2024-05-18 ENCOUNTER — Other Ambulatory Visit: Payer: Self-pay

## 2024-06-13 ENCOUNTER — Other Ambulatory Visit (HOSPITAL_COMMUNITY): Payer: Self-pay

## 2024-06-13 ENCOUNTER — Other Ambulatory Visit: Payer: Self-pay | Admitting: Family Medicine

## 2024-06-13 ENCOUNTER — Other Ambulatory Visit: Payer: Self-pay

## 2024-06-13 DIAGNOSIS — J4520 Mild intermittent asthma, uncomplicated: Secondary | ICD-10-CM

## 2024-06-13 DIAGNOSIS — M7918 Myalgia, other site: Secondary | ICD-10-CM

## 2024-06-13 MED ORDER — CYCLOBENZAPRINE HCL 10 MG PO TABS
10.0000 mg | ORAL_TABLET | Freq: Three times a day (TID) | ORAL | 1 refills | Status: AC | PRN
  Filled 2024-06-13 (×2): qty 60, 20d supply, fill #0

## 2024-06-15 ENCOUNTER — Other Ambulatory Visit: Payer: Self-pay

## 2024-06-15 ENCOUNTER — Telehealth: Payer: Self-pay | Admitting: Family Medicine

## 2024-06-15 DIAGNOSIS — J4520 Mild intermittent asthma, uncomplicated: Secondary | ICD-10-CM

## 2024-06-15 MED ORDER — BUDESONIDE-FORMOTEROL FUMARATE 160-4.5 MCG/ACT IN AERO
2.0000 | INHALATION_SPRAY | Freq: Two times a day (BID) | RESPIRATORY_TRACT | 2 refills | Status: DC
  Filled 2024-06-15 (×2): qty 10.2, 30d supply, fill #0

## 2024-06-15 MED ORDER — ALBUTEROL SULFATE HFA 108 (90 BASE) MCG/ACT IN AERS
2.0000 | INHALATION_SPRAY | Freq: Four times a day (QID) | RESPIRATORY_TRACT | 2 refills | Status: DC | PRN
  Filled 2024-06-15 (×2): qty 6.7, 25d supply, fill #0

## 2024-06-15 NOTE — Telephone Encounter (Signed)
 I have sent her refills to the pharmacy.

## 2024-06-15 NOTE — Telephone Encounter (Signed)
 Copied from CRM [id number]. Topic: Clinical - Prescription Issue >> Jun 15, 2024  8:59 AM Anne Schaefer wrote:  Reason for CRM: Pt called to check on the status of her prescription refills for Refused Budesonide -Formoterol  Fumarate and Albuterol  Sulfate, I relayed info. Pt requested a call back at [phone] to discuss status.

## 2024-06-15 NOTE — Telephone Encounter (Signed)
 Pt has been informed.

## 2024-06-15 NOTE — Telephone Encounter (Signed)
 Both inhalers were prescribed by the ED.

## 2024-06-15 NOTE — Addendum Note (Signed)
 Addended by: Cleburne Savini on: 06/15/2024 10:32 AM   Modules accepted: Orders

## 2024-07-02 ENCOUNTER — Other Ambulatory Visit: Payer: Self-pay

## 2024-07-02 ENCOUNTER — Encounter: Payer: Self-pay | Admitting: Family Medicine

## 2024-07-02 ENCOUNTER — Ambulatory Visit: Attending: Family Medicine | Admitting: Family Medicine

## 2024-07-02 VITALS — BP 162/99 | HR 84 | Temp 98.4°F | Ht 63.0 in | Wt 285.4 lb

## 2024-07-02 DIAGNOSIS — Z6841 Body Mass Index (BMI) 40.0 and over, adult: Secondary | ICD-10-CM

## 2024-07-02 DIAGNOSIS — R7303 Prediabetes: Secondary | ICD-10-CM

## 2024-07-02 DIAGNOSIS — K009 Disorder of tooth development, unspecified: Secondary | ICD-10-CM

## 2024-07-02 DIAGNOSIS — Z23 Encounter for immunization: Secondary | ICD-10-CM

## 2024-07-02 DIAGNOSIS — J4520 Mild intermittent asthma, uncomplicated: Secondary | ICD-10-CM | POA: Diagnosis not present

## 2024-07-02 DIAGNOSIS — I10 Essential (primary) hypertension: Secondary | ICD-10-CM

## 2024-07-02 LAB — POCT GLYCOSYLATED HEMOGLOBIN (HGB A1C): HbA1c, POC (prediabetic range): 6.2 % (ref 5.7–6.4)

## 2024-07-02 MED ORDER — BUDESONIDE-FORMOTEROL FUMARATE 160-4.5 MCG/ACT IN AERO
2.0000 | INHALATION_SPRAY | Freq: Two times a day (BID) | RESPIRATORY_TRACT | 2 refills | Status: AC
  Filled 2024-07-02 – 2024-08-15 (×3): qty 10.2, 30d supply, fill #0
  Filled 2024-08-15: qty 10.2, 30d supply, fill #1

## 2024-07-02 MED ORDER — VALSARTAN 160 MG PO TABS
160.0000 mg | ORAL_TABLET | Freq: Every day | ORAL | 1 refills | Status: AC
  Filled 2024-07-02 (×2): qty 90, 90d supply, fill #0

## 2024-07-02 MED ORDER — AMLODIPINE BESYLATE 10 MG PO TABS
10.0000 mg | ORAL_TABLET | Freq: Every day | ORAL | 1 refills | Status: AC
  Filled 2024-07-02: qty 90, 90d supply, fill #0

## 2024-07-02 MED ORDER — ALBUTEROL SULFATE HFA 108 (90 BASE) MCG/ACT IN AERS
2.0000 | INHALATION_SPRAY | Freq: Four times a day (QID) | RESPIRATORY_TRACT | 2 refills | Status: AC | PRN
  Filled 2024-07-02 – 2024-08-15 (×3): qty 6.7, 25d supply, fill #0
  Filled 2024-08-15: qty 6.7, 25d supply, fill #1

## 2024-07-02 NOTE — Patient Instructions (Signed)
 [facility]   [address] [facility] [place], [place] [address] Ph# [phone] Fax# [phone]

## 2024-07-02 NOTE — Progress Notes (Signed)
 "  Subjective:  Patient ID: Anne Schaefer, female    DOB: Apr 09, 1971  Age: 53 y.o. MRN: [id number]  CC: Medical Management of Chronic Issues     Discussed the use of AI scribe software for clinical note transcription with the patient, who gave verbal consent to proceed.  History of Present Illness Anne Schaefer is a 53 year old female with hypertension, asthma, and prediabetes who presents with concerns about weight gain and dental issues.  She reports significant recent weight gain, which she associates with her steroid inhaler. She has tried to lose weight with exercise and dietary changes, including eating earlier and increasing water intake, but her weight continues to fluctuate. This is her highest weight outside of pregnancy, and she is worried about effects on her asthma and risk for diabetes, given that her mother died from congestive heart failure and diabetes and her sister improved diabetes with weight loss. She is highly motivated to lose weight.  She has chronic knee issues and received a knee injection in January, but her knee is not currently painful. She has swelling of her legs and ankles, worse after working overtime. Her previously fractured ankle is now swollen and uncomfortable.  She is concerned about her dental health due to teeth breaking and the overall condition of her mouth. She tried to follow up on a prior dental referral but has not been contacted and still needs care.  Her asthma is stable since restarting her inhaler after a recent lapse in coverage. She continues to smoke cigarettes occasionally but is trying to cut down. Her blood pressure is elevated and she endorses taking her antihypertensive. She works night shifts and has 11 grandchildren. She wants to improve her health and longevity to remain active with them.    Past Medical History:  Diagnosis Date   Allergy    Anemia    on Iron    Asthma    Diverticulitis    Headache    migraine in 2015    Hematoma 01/10/2015   History of blood transfusion    Hypertension    Infection    trich   Obesity    Ovarian cyst    Pneumonia    had 2 times 1997 and 2012   Rectus sheath hematoma s/p hysterectomy 12/31/14 01/12/2015   Seasonal allergies     Past Surgical History:  Procedure Laterality Date   ABDOMINAL HYSTERECTOMY N/A 12/31/2014   Procedure: HYSTERECTOMY ABDOMINAL;  Surgeon: Lynwood KANDICE Solomons, MD;  Location: [facility];  Service: Gynecology;  Laterality: N/A;   CESAREAN SECTION     FOREHEAD RECONSTRUCTION     as a child   SALPINGOOPHORECTOMY Right 12/31/2014   Procedure: RIGHT SALPINGO OOPHORECTOMY;  Surgeon: Lynwood KANDICE Solomons, MD;  Location: [facility];  Service: Gynecology;  Laterality: Right;   TUBAL LIGATION     WISDOM TOOTH EXTRACTION      Family History  Problem Relation Age of Onset   Diabetes Mother    Heart disease Mother        chf   Hypertension Father    Colon cancer Neg Hx    Colon polyps Neg Hx    Esophageal cancer Neg Hx    Prostate cancer Neg Hx    Rectal cancer Neg Hx     Social History   Socioeconomic History   Marital status: Single    Spouse name: Not on file   Number of children: Not on file   Years of education: Not  on file   Highest education level: Not on file  Occupational History   Not on file  Tobacco Use   Smoking status: Every Day    Current packs/day: 0.25    Average packs/day: 0.3 packs/day for 8.0 years (2.0 ttl pk-yrs)    Types: Cigarettes   Smokeless tobacco: Never   Tobacco comments:    Smoke 2 packs during week ; 5 cigs a day  Vaping Use   Vaping status: Never Used  Substance and Sexual Activity   Alcohol use: Yes    Comment: occ   Drug use: Yes    Types: Marijuana    Comment: occasionally   Sexual activity: Not Currently    Birth control/protection: Surgical  Other Topics Concern   Not on file  Social History Narrative   Not on file   Social Drivers of Health   Tobacco Use: High Risk (03/12/2024)   Patient History     Smoking Tobacco Use: Every Day    Smokeless Tobacco Use: Never    Passive Exposure: Not on file  Financial Resource Strain: Not on file  Food Insecurity: No Food Insecurity (08/12/2022)   Hunger Vital Sign    Worried About Running Out of Food in the Last Year: Never true    Ran Out of Food in the Last Year: Never true  Transportation Needs: Not on file  Physical Activity: Not on file  Stress: Not on file  Social Connections: Unknown (01/22/2022)   [facility]   Social Network    Social Network: Not on file  Depression (PHQ2-9): Low Risk (12/28/2023)   Depression (PHQ2-9)    PHQ-2 Score: 0  Alcohol Screen: Not on file  Housing: Medium Risk (08/12/2022)   Housing    Last Housing Risk Score: 1  Utilities: Not on file  Health Literacy: Not on file    Allergies[1]  Outpatient Medications Prior to Visit  Medication Sig Dispense Refill   albuterol  (PROVENTIL ) (2.5 MG/3ML) 0.083% nebulizer solution Take 3 mLs (2.5 mg total) by nebulization every 6 (six) hours as needed for wheezing or shortness of breath. 75 mL 12   cyclobenzaprine  (FLEXERIL ) 10 MG tablet Take 1 tablet (10 mg total) by mouth 3 (three) times daily as needed for muscle spasms. 60 tablet 1   methylPREDNISolone  (MEDROL  DOSEPAK) 4 MG TBPK tablet Take as directed starting 9/2 21 each 0   albuterol  (VENTOLIN  HFA) 108 (90 Base) MCG/ACT inhaler Inhale 2 puffs into the lungs every 6 (six) hours as needed for wheezing or shortness of breath. 6.7 g 2   amLODipine  (NORVASC ) 10 MG tablet Take 1 tablet (10 mg total) by mouth daily. 90 tablet 1   budesonide -formoterol  (SYMBICORT ) 160-4.5 MCG/ACT inhaler Inhale 2 puffs into the lungs 2 (two) times daily. 10.2 g 2   losartan  (COZAAR ) 100 MG tablet Take 1 tablet (100 mg total) by mouth daily. 90 tablet 1   No facility-administered medications prior to visit.     ROS Review of Systems  Constitutional:  Negative for activity change and appetite change.  HENT:  Positive for  dental problem. Negative for sinus pressure and sore throat.   Respiratory:  Negative for chest tightness, shortness of breath and wheezing.   Cardiovascular:  Negative for chest pain and palpitations.  Gastrointestinal:  Negative for abdominal distention, abdominal pain and constipation.  Genitourinary: Negative.   Musculoskeletal: Negative.   Psychiatric/Behavioral:  Negative for behavioral problems and dysphoric mood.     Objective:  BP ROLLEN)  162/99   Pulse 84   Temp 98.4 F (36.9 C) (Oral)   Ht 5' 3 (1.6 m)   Wt 285 lb 6.4 oz (129.5 kg)   LMP 12/05/2014   SpO2 96%   BMI 50.56 kg/m      07/02/2024    2:54 PM 07/02/2024    2:10 PM 03/12/2024    9:00 PM  BP/Weight  Systolic BP 162 151 146  Diastolic BP 99 84 90  Wt. (Lbs)  285.4   BMI  50.56 kg/m2     Wt Readings from Last 3 Encounters:  07/02/24 285 lb 6.4 oz (129.5 kg)  12/28/23 285 lb (129.3 kg)  09/21/23 280 lb 10.3 oz (127.3 kg)      Physical Exam Constitutional:      Appearance: She is well-developed. She is obese.  HENT:     Mouth/Throat:     Comments: Loss of multiple teeth Cardiovascular:     Rate and Rhythm: Normal rate.     Heart sounds: Normal heart sounds. No murmur heard. Pulmonary:     Effort: Pulmonary effort is normal.     Breath sounds: Normal breath sounds. No wheezing or rales.  Chest:     Chest wall: No tenderness.  Abdominal:     General: Bowel sounds are normal. There is no distension.     Palpations: Abdomen is soft. There is no mass.     Tenderness: There is no abdominal tenderness.  Musculoskeletal:        General: Normal range of motion.     Right lower leg: No edema.     Left lower leg: No edema.  Neurological:     Mental Status: She is alert and oriented to person, place, and time.  Psychiatric:        Mood and Affect: Mood normal.        Latest Ref Rng & Units 03/12/2024    3:03 PM 09/21/2023    5:30 PM 03/09/2023    2:20 PM  CMP  Glucose 70 - 99 mg/dL 866  893  890    BUN 6 - 20 mg/dL 7  15  21    Creatinine 0.44 - 1.00 mg/dL 9.11  8.98  9.02   Sodium 135 - 145 mmol/L 141  138  141   Potassium 3.5 - 5.1 mmol/L 4.1  3.9  4.2   Chloride 98 - 111 mmol/L 104  101  103   CO2 22 - 32 mmol/L 24  27  22    Calcium 8.9 - 10.3 mg/dL 9.2  9.5  9.5   Total Protein 6.0 - 8.5 g/dL   7.2   Total Bilirubin 0.0 - 1.2 mg/dL   0.2   Alkaline Phos 44 - 121 IU/L   82   AST 0 - 40 IU/L   23   ALT 0 - 32 IU/L   22     Lipid Panel     Component Value Date/Time   CHOL 156 06/08/2022 1551   TRIG 125 06/08/2022 1551   HDL 40 06/08/2022 1551   CHOLHDL 3.9 06/08/2022 1551   CHOLHDL 4.0 09/03/2015 1136   VLDL 21 09/03/2015 1136   LDLCALC 94 06/08/2022 1551    CBC    Component Value Date/Time   WBC 7.0 03/12/2024 1503   RBC 4.87 03/12/2024 1503   HGB 13.7 03/12/2024 1503   HGB 13.6 03/09/2023 1420   HCT 42.8 03/12/2024 1503   HCT 40.5 03/09/2023 1420   PLT 304  03/12/2024 1503   PLT 288 03/09/2023 1420   MCV 87.9 03/12/2024 1503   MCV 87 03/09/2023 1420   MCH 28.1 03/12/2024 1503   MCHC 32.0 03/12/2024 1503   RDW 14.0 03/12/2024 1503   RDW 13.2 03/09/2023 1420   LYMPHSABS 3.3 09/21/2023 1730   LYMPHSABS 3.5 (H) 03/09/2023 1420   MONOABS 0.7 09/21/2023 1730   EOSABS 0.1 09/21/2023 1730   EOSABS 0.2 03/09/2023 1420   BASOSABS 0.0 09/21/2023 1730   BASOSABS 0.0 03/09/2023 1420    Lab Results  Component Value Date   HGBA1C 6.2 07/02/2024    Lab Results  Component Value Date   HGBA1C 6.2 07/02/2024   HGBA1C 6.3 12/28/2023   HGBA1C 6.4 06/30/2023       Assessment & Plan Morbid obesity due to excess calories Weight gain persists despite diet and exercise. Potential steroid-related weight gain. Motivated to reduce risk of diabetes and cardiovascular disease. - Referred to [facility] for weight loss program and potential use of Wegovy. - Encouraged continuation of diet and exercise regimen.  Primary hypertension Elevated blood  pressure possibly due to lack of sleep and stress. Switching medication for better management. - Switched losartan  to valsartan  160 mg daily. -Counseled on blood pressure goal of less than 130/80, low-sodium, DASH diet, medication compliance, 150 minutes of moderate intensity exercise per week. Discussed medication compliance, adverse effects.   Prediabetes A1c of 6.2 indicates prediabetes. Motivated to prevent diabetes progression due to family history. - Encouraged weight loss to help reverse prediabetes.  Mild intermittent asthma Asthma well-controlled with current inhaler regimen. - Continue current asthma management with inhalers.  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 Previous referral not followed up. - Provided contact information for [facility] to facilitate follow-up for dental care.  General health maintenance Encounter for vaccine administration-declined pneumonia vaccine, agreed to flu shot.       Meds ordered this encounter  Medications   albuterol  (VENTOLIN  HFA) 108 (90 Base) MCG/ACT inhaler    Sig: Inhale 2 puffs into the lungs every 6 (six) hours as needed for wheezing or shortness of breath.    Dispense:  6.7 g    Refill:  2   amLODipine  (NORVASC ) 10 MG tablet    Sig: Take 1 tablet (10 mg total) by mouth daily.    Dispense:  90 tablet    Refill:  1   budesonide -formoterol  (SYMBICORT ) 160-4.5 MCG/ACT inhaler    Sig: Inhale 2 puffs into the lungs 2 (two) times daily.    Dispense:  10.2 g    Refill:  2   valsartan  (DIOVAN ) 160 MG tablet    Sig: Take 1 tablet (160 mg total) by mouth daily.Discontinue Losartan .    Dispense:  90 tablet    Refill:  1    Discontinue Losartan     Follow-up: Return in about 3 months (around 09/30/2024) for Chronic medical conditions.       Corrina Sabin, MD, FAAFP. [facility] and [facility], [place] [phone]   07/02/2024, 5:20 PM      [1]  Allergies Allergen Reactions   Ibuprofen Shortness Of Breath   Azithromycin Hives   Other Itching and Rash    Pt states that she is allergic to [facility].       "

## 2024-07-18 ENCOUNTER — Other Ambulatory Visit (HOSPITAL_COMMUNITY): Payer: Self-pay

## 2024-07-18 ENCOUNTER — Other Ambulatory Visit: Payer: Self-pay

## 2024-08-15 ENCOUNTER — Other Ambulatory Visit (HOSPITAL_COMMUNITY): Payer: Self-pay

## 2024-08-15 ENCOUNTER — Other Ambulatory Visit: Payer: Self-pay

## 2024-10-01 ENCOUNTER — Ambulatory Visit: Payer: Self-pay | Admitting: Family Medicine
